# Patient Record
Sex: Male | Born: 1955
Health system: Southern US, Community
[De-identification: ages and names within clinical notes are randomized; demographics above are authoritative.]

## PROBLEM LIST (undated history)

## (undated) DIAGNOSIS — F32A Depression, unspecified: Secondary | ICD-10-CM

## (undated) DIAGNOSIS — E119 Type 2 diabetes mellitus without complications: Secondary | ICD-10-CM

## (undated) DIAGNOSIS — I639 Cerebral infarction, unspecified: Secondary | ICD-10-CM

## (undated) DIAGNOSIS — F329 Major depressive disorder, single episode, unspecified: Secondary | ICD-10-CM

## (undated) DIAGNOSIS — I10 Essential (primary) hypertension: Secondary | ICD-10-CM

---

## 2002-11-30 ENCOUNTER — Inpatient Hospital Stay (HOSPITAL_COMMUNITY): Admission: AD | Admit: 2002-11-30 | Discharge: 2002-12-03 | Payer: Self-pay | Admitting: Psychiatry

## 2004-01-11 ENCOUNTER — Inpatient Hospital Stay (HOSPITAL_COMMUNITY): Admission: RE | Admit: 2004-01-11 | Discharge: 2004-01-16 | Payer: Self-pay | Admitting: Psychiatry

## 2004-01-11 ENCOUNTER — Ambulatory Visit: Payer: Self-pay | Admitting: Psychiatry

## 2004-03-04 ENCOUNTER — Ambulatory Visit: Payer: Self-pay | Admitting: Psychiatry

## 2004-03-04 ENCOUNTER — Inpatient Hospital Stay (HOSPITAL_COMMUNITY): Admission: AD | Admit: 2004-03-04 | Discharge: 2004-03-09 | Payer: Self-pay | Admitting: Psychiatry

## 2004-05-15 ENCOUNTER — Inpatient Hospital Stay (HOSPITAL_COMMUNITY): Admission: AD | Admit: 2004-05-15 | Discharge: 2004-05-20 | Payer: Self-pay | Admitting: Psychiatry

## 2004-05-15 ENCOUNTER — Ambulatory Visit: Payer: Self-pay | Admitting: Psychiatry

## 2004-12-01 ENCOUNTER — Emergency Department (HOSPITAL_COMMUNITY): Admission: EM | Admit: 2004-12-01 | Discharge: 2004-12-01 | Payer: Self-pay | Admitting: Emergency Medicine

## 2005-10-30 ENCOUNTER — Inpatient Hospital Stay (HOSPITAL_COMMUNITY): Admission: AD | Admit: 2005-10-30 | Discharge: 2005-10-31 | Payer: Self-pay | Admitting: Psychiatry

## 2005-10-30 ENCOUNTER — Ambulatory Visit: Payer: Self-pay | Admitting: Psychiatry

## 2006-11-04 ENCOUNTER — Inpatient Hospital Stay (HOSPITAL_COMMUNITY): Admission: AD | Admit: 2006-11-04 | Discharge: 2006-11-10 | Payer: Self-pay | Admitting: Psychiatry

## 2006-11-04 ENCOUNTER — Ambulatory Visit: Payer: Self-pay | Admitting: *Deleted

## 2007-01-31 ENCOUNTER — Inpatient Hospital Stay (HOSPITAL_COMMUNITY): Admission: AD | Admit: 2007-01-31 | Discharge: 2007-02-01 | Payer: Self-pay | Admitting: Psychiatry

## 2007-01-31 ENCOUNTER — Ambulatory Visit: Payer: Self-pay | Admitting: Psychiatry
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
## 2010-07-09 NOTE — H&P (Signed)
[name], [name] NO.:  [id number]   MEDICAL RECORD NO.:  [id number]          PATIENT TYPE:  IPS   LOCATION:  0501                          FACILITY:  [facility]   PHYSICIAN:  Anselm Jungling, MD  DATE OF BIRTH:  10/28/1955   DATE OF ADMISSION:  01/31/2007  DATE OF DISCHARGE:                       PSYCHIATRIC ADMISSION ASSESSMENT   A 55 year old male voluntarily admitted on January 31, 2007.   HISTORY OF PRESENT ILLNESS:  The patient presents as a transfer from  [facility] where the patient was reporting auditory  hallucinations and insomnia.  States per chart that the voices were off  the wall was delusional, thinking that he was hanging out with Karie Chimera, who is a race car driver.  He does report today that he states  that the change of the daylight hours has prompted his depression.  He  regrets coming, does not feel suicidal.  Denies any psychotic symptoms  at this time.   PAST PSYCHIATRIC HISTORY:  The patient has been here prior.  No current  outpatient mental health treatment.   SOCIAL HISTORY:  He is a 55 year old male who lives alone, is on  disability for medical problems.   FAMILY HISTORY:  Is none that we are  aware of.   ALCOHOL DRUG HISTORY:  The patient smokes, denies any use of alcohol or  drug use.   MEDICAL HISTORY:  Primary care Aquila Delaughter.  The patient cannot remember  his primary care Jamina Macbeth.  Medical problems.  Has a history of back  pain, MI, hypertension and COPD.   MEDICATIONS:  Xanax 1 mg p.o. t.i.d., oxycodone ER 40 mg p.o. q.12 h,  oxycodone IR 5 mg q.6 h p.r.n., Celexa 40 mg daily, Simvastatin 40 mg at  bedtime and metoprolol ER 200 mg daily.   PHYSICAL FINDINGS:  This is a middle-aged male.  He is resting, appears  in no acute distress.  He was fully assessed at [facility].  No  acute findings.  He did receive Ativan 2 mg p.o. and Haldol 5 mg p.o.  for his psychotic symptoms.  Temperature is 96, 105 heart  rate, 16  respirations, blood pressure 122/91, 98% saturated, his pulse ox.  He is  183 pounds at 5 feet 9 inches tall.   LABORATORY DATA:  Has an abnormal EKG.  Potassium of 3.3, BUN is six.  CBC within normal limits.  His urine drug screen showed positive for  benzodiazepines, positive for opiates, positive for marijuana, positive  for methadone.   MENTAL STATUS EXAM:  Again he is resting in the bed at this time,  willingly participates  in the interview, fully alert, cooperative,  casually dressed, fair eye contact.  His speech is polite, clear.  Mood  is depressed.  The patient's affect is sad.  Thought process denies any  suicidal ideation or psychotic symptoms.  Thought process are coherent  and logical, cognitive function intact.  His memory is fair, again with  inability to remember his primary care Mitali Shenefield.  Judgment and insight  is adequate.   AXIS I:  Major depressive  disorder recurrent, seasonal affective  disorder.  Cannabis abuse.  AXIS II:  Deferred.  AXIS III:  COPD, CVA, back pain and hypertension.  AXIS IV:  Medical problems, other psychosocial problems related to  burden of illness.  AXIS V:  Current is 45.   PLAN:  To contract for safety.  Stabilize his mood thinking.  We will  continue with all his medications.  The patient is denying any suicidal  thoughts.  Case manager will assess his follow-up at [facility].  The  patient will follow up with his primary care Mildreth Reek as advised for  medical problems and further refills of his medications.   Tentative length of stay is 2 days.      Landry Corporal, N.P.      Anselm Jungling, MD  Electronically Signed    JO/MEDQ  D:  02/01/2007  T:  02/01/2007  Job:  [id number]
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
## 2010-07-12 NOTE — Discharge Summary (Signed)
[name], [name] NO.:  [id number]   MEDICAL RECORD NO.:  [id number]          PATIENT TYPE:  IPS   LOCATION:  0307                          FACILITY:  [facility]   PHYSICIAN:  [name], M.D. DATE OF BIRTH:  17-Jul-1955   DATE OF ADMISSION:  05/15/2004  DATE OF DISCHARGE:  05/20/2004                                 DISCHARGE SUMMARY   This is a 55 year old divorced white male that was involuntarily committed  on May 15, 2004 with suicidal thoughts but no plan, with stressors  including financial issues, lack of support.  The patient has been abusing  pain medications for approximately six weeks and also was using  benzodiazepines, smoking marijuana.  The patient reports panic attacks but  denied any psychotic symptoms.  The patient has been here previously at  least three times.   MEDICAL PROBLEMS.:  1.  Multiple musculoskeletal problems with past surgeries to his knee and      left wrist.  2.  Chronic pain.   CURRENT MEDICATIONS:  1.  Celexa 40 mg daily.  2.  Neurontin 300 mg daily at 2 p.m., 600 mg at 9 p.m.  3.  Seroquel 25 mg taking three tablets at 9 p.m.  4.  Valium 5 mg two tablets at 9 a.m., 1 p.m., 6 p.m. and 9 p.m.  5.  Ambien 10 mg take 1-2 at bedtime.  6.  Plavix 75 mg daily.  7.  Toprol XL 25 mg daily.  8.  Roxicodone 30 mg daily at 6 a.m., 1 p.m. and 7 p.m. for breakthrough      pain.  9.  OxyContin SR 40 mg twice daily.  10. Crestor 10 mg daily.   PHYSICAL EXAMINATION:  Performed and this is a well-developed, well-  nourished male with a pronounced due to right leg shortening after surgery  with limited range of motion and weakness to his left shoulder.  Neurological findings were intact.   LABORATORY DATA:  CBC was within normal limits.  BUN was low though at 5.  Liver functions were normal with an albumin mildly low at 3.3.  TSH was  normal.  Urine drug screen was positive for benzodiazepines and urinalysis  was negative.   MENTAL STATUS EXAM:  This is an alert and oriented male, oriented x4.  Appearance was disheveled, behavior was appropriate.  Speech is clear with  even tone and volume although somewhat tangential.  The patient appears  anxious and mildly depressed.  Thought processes, he is having some suicidal  thoughts without psychotic symptoms or mania observed.  Cognitive function,  concentration was normal.  Memory intact.  Judgment was poor.  Impulse fair.  Insight was poor.   DIAGNOSES:  Axis I.  Major depressive disorder, recurrent, severe with  suicidal ideation and polysubstance abuse.  Axis II.  Deferred.  Axis III.  Coronary artery disease status post coronary artery bypass  grafting, history of transient ischemic attack, multiple musculoskeletal  problems.  Axis IV.  Moderate with primary support group, lack of support, occupational  problems, economical problems and medical problems.  HOSPITAL COURSE:  The patient was initially detoxified off of opiates with a  clonidine protocol with withdrawal symptoms noted.  The patient was also  detoxified on low dose Librium for increased use and abuse of  benzodiazepines.  The patient was offered muscle relaxant for pain.  Resumed  his routine medications.  We did resume the patient's Duragesic patch with  the patient to adhere to routine follow up for his pain with either his  primary care doctor or [facility] Clinic.  His medications were  optimized for agitation and mood instability.  The patient was medication  seeking and threatening to search if we did not address his complaints of  pain.  The patient did, however, improve with optimization of his  medications, again addressing his pain issues and need for consistent follow  up.  On the day of discharge the patient was in full contact with reality  and denied any suicidal thoughts, tolerating his medication.  He was  agreeable to discharge plan.  He was attending groups and behavior  with  others and interaction was appropriate.   POSTHOSPITAL CARE PLAN:  1.  The patient was discharged on his metoprolol 25 mg 1 tablet daily,      clopidogrel 75 mg 1 daily with food, Zocor 40 mg 1 tablet at 6 p.m.      daily, Septra, Bactrim double-strength tablets 1 tablet at 6 and 1      tablet at 7 on an empty stomach for 12 days, tetracycline 250 mg 1      tablet at 6 and 1 tablet at 7 on an empty stomach for 12 days, Seroquel      100 mg taking 1-1/2 tablets by mouth at 8 p.m., Neurontin 1 tablet by      mouth daily at 8 p.m., fentanyl patch 75 mcg to be changed every three      days, gabapentin 400 mg 1 tablet at 9 a.m. and 4 p.m., Valium 10 mg 1      four times a day.  2.  The patient was to follow up with the a pain clinic of his choice.  He      was also to attend [facility] with appointment made.  Phone number and      address provided.  3.  The patient was advised not to use alcohol or any other drugs.   DISCHARGE DIAGNOSES:  Axis I.  Major depressive disorder, recurrent severe  with suicidal thoughts.  Polysubstance abuse.  Axis II.  Deferred.  Axis III.  Coronary artery disease, status post coronary artery bypass  grafting.  History of transient ischemic attack.  Multiple musculoskeletal  problems.  Axis IV.  Moderate psychosocial stressors.  Axis V.  Current is 55, estimated this past year is 88.      JO/MEDQ  D:  07/04/2004  T:  07/04/2004  Job:  [id number]
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
## 2010-12-06 LAB — TSH: TSH: 3.089

## 2010-12-06 LAB — URINALYSIS, ROUTINE W REFLEX MICROSCOPIC
Bilirubin Urine: NEGATIVE
Glucose, UA: NEGATIVE
Hgb urine dipstick: NEGATIVE
Ketones, ur: NEGATIVE
Nitrite: NEGATIVE
Protein, ur: NEGATIVE
Specific Gravity, Urine: 1.011
Urobilinogen, UA: 1
pH: 6.5

## 2013-10-15 ENCOUNTER — Inpatient Hospital Stay (HOSPITAL_COMMUNITY): Payer: Medicare Other

## 2013-10-15 ENCOUNTER — Inpatient Hospital Stay (HOSPITAL_COMMUNITY)
Admission: EM | Admit: 2013-10-15 | Discharge: 2013-10-17 | DRG: 065 | Disposition: A | Discharge status: Home Health | Payer: Medicare Other | Attending: Internal Medicine | Admitting: Internal Medicine

## 2013-10-15 ENCOUNTER — Encounter (HOSPITAL_COMMUNITY): Payer: Self-pay | Admitting: Emergency Medicine

## 2013-10-15 ENCOUNTER — Emergency Department (HOSPITAL_COMMUNITY): Payer: Medicare Other

## 2013-10-15 DIAGNOSIS — Z8673 Personal history of transient ischemic attack (TIA), and cerebral infarction without residual deficits: Secondary | ICD-10-CM | POA: Diagnosis not present

## 2013-10-15 DIAGNOSIS — I658 Occlusion and stenosis of other precerebral arteries: Secondary | ICD-10-CM | POA: Diagnosis present

## 2013-10-15 DIAGNOSIS — R29898 Other symptoms and signs involving the musculoskeletal system: Secondary | ICD-10-CM | POA: Diagnosis present

## 2013-10-15 DIAGNOSIS — E663 Overweight: Secondary | ICD-10-CM | POA: Diagnosis present

## 2013-10-15 DIAGNOSIS — E785 Hyperlipidemia, unspecified: Secondary | ICD-10-CM | POA: Diagnosis present

## 2013-10-15 DIAGNOSIS — I2510 Atherosclerotic heart disease of native coronary artery without angina pectoris: Secondary | ICD-10-CM | POA: Diagnosis present

## 2013-10-15 DIAGNOSIS — I6529 Occlusion and stenosis of unspecified carotid artery: Secondary | ICD-10-CM | POA: Diagnosis present

## 2013-10-15 DIAGNOSIS — R4789 Other speech disturbances: Secondary | ICD-10-CM | POA: Diagnosis present

## 2013-10-15 DIAGNOSIS — Z951 Presence of aortocoronary bypass graft: Secondary | ICD-10-CM | POA: Diagnosis not present

## 2013-10-15 DIAGNOSIS — E119 Type 2 diabetes mellitus without complications: Secondary | ICD-10-CM | POA: Diagnosis present

## 2013-10-15 DIAGNOSIS — I10 Essential (primary) hypertension: Secondary | ICD-10-CM | POA: Diagnosis present

## 2013-10-15 DIAGNOSIS — I6350 Cerebral infarction due to unspecified occlusion or stenosis of unspecified cerebral artery: Secondary | ICD-10-CM | POA: Diagnosis present

## 2013-10-15 DIAGNOSIS — I6330 Cerebral infarction due to thrombosis of unspecified cerebral artery: Secondary | ICD-10-CM

## 2013-10-15 DIAGNOSIS — I252 Old myocardial infarction: Secondary | ICD-10-CM | POA: Diagnosis not present

## 2013-10-15 DIAGNOSIS — E871 Hypo-osmolality and hyponatremia: Secondary | ICD-10-CM | POA: Diagnosis present

## 2013-10-15 DIAGNOSIS — I639 Cerebral infarction, unspecified: Secondary | ICD-10-CM

## 2013-10-15 DIAGNOSIS — F17200 Nicotine dependence, unspecified, uncomplicated: Secondary | ICD-10-CM | POA: Diagnosis present

## 2013-10-15 DIAGNOSIS — Z6828 Body mass index (BMI) 28.0-28.9, adult: Secondary | ICD-10-CM | POA: Diagnosis not present

## 2013-10-15 DIAGNOSIS — F1210 Cannabis abuse, uncomplicated: Secondary | ICD-10-CM | POA: Diagnosis present

## 2013-10-15 DIAGNOSIS — N289 Disorder of kidney and ureter, unspecified: Secondary | ICD-10-CM | POA: Diagnosis present

## 2013-10-15 HISTORY — DX: Essential (primary) hypertension: I10

## 2013-10-15 HISTORY — DX: Type 2 diabetes mellitus without complications: E11.9

## 2013-10-15 HISTORY — DX: Cerebral infarction, unspecified: I63.9

## 2013-10-15 LAB — PROTIME-INR
INR: 0.94 (ref 0.00–1.49)
Prothrombin Time: 12.6 seconds (ref 11.6–15.2)

## 2013-10-15 LAB — COMPREHENSIVE METABOLIC PANEL
ALT: 7 U/L (ref 0–53)
AST: 10 U/L (ref 0–37)
Albumin: 3.5 g/dL (ref 3.5–5.2)
Alkaline Phosphatase: 62 U/L (ref 39–117)
Anion gap: 13 (ref 5–15)
BUN: 14 mg/dL (ref 6–23)
CALCIUM: 9.2 mg/dL (ref 8.4–10.5)
CO2: 26 mEq/L (ref 19–32)
CREATININE: 1.22 mg/dL (ref 0.50–1.35)
Chloride: 95 mEq/L — ABNORMAL LOW (ref 96–112)
GFR calc Af Amer: 75 mL/min — ABNORMAL LOW (ref >90)
GFR calc non Af Amer: 65 mL/min — ABNORMAL LOW (ref >90)
GLUCOSE: 104 mg/dL — AB (ref 70–99)
Potassium: 4.5 mEq/L (ref 3.7–5.3)
SODIUM: 134 meq/L — AB (ref 137–147)
Total Bilirubin: 0.2 mg/dL — ABNORMAL LOW (ref 0.3–1.2)
Total Protein: 6.7 g/dL (ref 6.0–8.3)

## 2013-10-15 LAB — DIFFERENTIAL
Basophils Absolute: 0.1 10*3/uL (ref 0.0–0.1)
Basophils Relative: 1 % (ref 0–1)
Eosinophils Absolute: 0.1 10*3/uL (ref 0.0–0.7)
Eosinophils Relative: 2 % (ref 0–5)
LYMPHS PCT: 38 % (ref 12–46)
Lymphs Abs: 1.9 10*3/uL (ref 0.7–4.0)
MONO ABS: 0.5 10*3/uL (ref 0.1–1.0)
MONOS PCT: 9 % (ref 3–12)
Neutro Abs: 2.6 10*3/uL (ref 1.7–7.7)
Neutrophils Relative %: 50 % (ref 43–77)

## 2013-10-15 LAB — CBC
HEMATOCRIT: 40.7 % (ref 39.0–52.0)
HEMOGLOBIN: 14.2 g/dL (ref 13.0–17.0)
MCH: 30.6 pg (ref 26.0–34.0)
MCHC: 34.9 g/dL (ref 30.0–36.0)
MCV: 87.7 fL (ref 78.0–100.0)
Platelets: 196 10*3/uL (ref 150–400)
RBC: 4.64 MIL/uL (ref 4.22–5.81)
RDW: 14.4 % (ref 11.5–15.5)
WBC: 5.1 10*3/uL (ref 4.0–10.5)

## 2013-10-15 LAB — I-STAT CHEM 8, ED
BUN: 14 mg/dL (ref 6–23)
Calcium, Ion: 1.11 mmol/L — ABNORMAL LOW (ref 1.12–1.23)
Chloride: 98 mEq/L (ref 96–112)
Creatinine, Ser: 1.4 mg/dL — ABNORMAL HIGH (ref 0.50–1.35)
GLUCOSE: 109 mg/dL — AB (ref 70–99)
HCT: 43 % (ref 39.0–52.0)
HEMOGLOBIN: 14.6 g/dL (ref 13.0–17.0)
Potassium: 3.9 mEq/L (ref 3.7–5.3)
Sodium: 133 mEq/L — ABNORMAL LOW (ref 137–147)
TCO2: 26 mmol/L (ref 0–100)

## 2013-10-15 LAB — GLUCOSE, CAPILLARY
GLUCOSE-CAPILLARY: 95 mg/dL (ref 70–99)
Glucose-Capillary: 89 mg/dL (ref 70–99)

## 2013-10-15 LAB — I-STAT TROPONIN, ED: Troponin i, poc: 0 ng/mL (ref 0.00–0.08)

## 2013-10-15 LAB — ETHANOL: Alcohol, Ethyl (B): <11 mg/dL (ref 0–11)

## 2013-10-15 LAB — APTT: aPTT: 29 seconds (ref 24–37)

## 2013-10-15 MED ORDER — STUDY - INVESTIGATIONAL DRUG SIMPLE RECORD
90.0000 mg | Freq: Two times a day (BID) | Status: DC
  Filled 2013-10-15: qty 90

## 2013-10-15 MED ORDER — SIMVASTATIN 40 MG PO TABS
40.0000 mg | ORAL_TABLET | Freq: Every day | ORAL | Status: DC
  Administered 2013-10-15 – 2013-10-16 (×2): 40 mg via ORAL
  Filled 2013-10-15 (×2): qty 1

## 2013-10-15 MED ORDER — STUDY - INVESTIGATIONAL DRUG SIMPLE RECORD
300.0000 mg | Freq: Once | Status: AC
  Administered 2013-10-15: 300 mg via ORAL
  Filled 2013-10-15: qty 300

## 2013-10-15 MED ORDER — SODIUM CHLORIDE 0.9 % IV SOLN
INTRAVENOUS | Status: AC
  Administered 2013-10-15: 1000 mL via INTRAVENOUS

## 2013-10-15 MED ORDER — STUDY - INVESTIGATIONAL DRUG SIMPLE RECORD
180.0000 mg | Freq: Once | Status: AC
  Administered 2013-10-15: 180 mg via ORAL
  Filled 2013-10-15: qty 180

## 2013-10-15 MED ORDER — INSULIN ASPART 100 UNIT/ML ~~LOC~~ SOLN: 0.0000 [IU] | Freq: Three times a day (TID) | SUBCUTANEOUS | Status: DC

## 2013-10-15 MED ORDER — STUDY - INVESTIGATIONAL DRUG SIMPLE RECORD
100.0000 mg | Freq: Every day | Status: DC
  Administered 2013-10-16 – 2013-10-17 (×2): 100 mg via ORAL
  Filled 2013-10-15 (×2): qty 100

## 2013-10-15 MED ORDER — ENOXAPARIN SODIUM 40 MG/0.4ML ~~LOC~~ SOLN
40.0000 mg | SUBCUTANEOUS | Status: DC
  Administered 2013-10-15: 40 mg via SUBCUTANEOUS
  Filled 2013-10-15: qty 0.4

## 2013-10-15 MED ORDER — HYDRALAZINE HCL 20 MG/ML IJ SOLN: 10.0000 mg | Freq: Four times a day (QID) | INTRAMUSCULAR | Status: DC | PRN

## 2013-10-15 MED ORDER — ASPIRIN 325 MG PO TABS
325.0000 mg | ORAL_TABLET | Freq: Every day | ORAL | Status: DC
  Administered 2013-10-15: 325 mg via ORAL
  Filled 2013-10-15: qty 1

## 2013-10-15 MED ORDER — TRAZODONE HCL 100 MG PO TABS: 200.0000 mg | ORAL_TABLET | Freq: Every evening | ORAL | Status: DC | PRN

## 2013-10-15 MED ORDER — ALPRAZOLAM 0.5 MG PO TABS
1.0000 mg | ORAL_TABLET | Freq: Three times a day (TID) | ORAL | Status: DC
  Administered 2013-10-15 – 2013-10-17 (×6): 1 mg via ORAL
  Filled 2013-10-15 (×6): qty 2

## 2013-10-15 MED ORDER — ONDANSETRON HCL 4 MG/2ML IJ SOLN: 4.0000 mg | Freq: Three times a day (TID) | INTRAMUSCULAR | Status: AC | PRN

## 2013-10-15 MED ORDER — GABAPENTIN 400 MG PO CAPS
800.0000 mg | ORAL_CAPSULE | Freq: Three times a day (TID) | ORAL | Status: DC
  Administered 2013-10-15 – 2013-10-17 (×6): 800 mg via ORAL
  Filled 2013-10-15 (×4): qty 2
  Filled 2013-10-15: qty 8
  Filled 2013-10-15: qty 2

## 2013-10-15 MED ORDER — SENNOSIDES-DOCUSATE SODIUM 8.6-50 MG PO TABS: 1.0000 | ORAL_TABLET | Freq: Every evening | ORAL | Status: DC | PRN

## 2013-10-15 MED ORDER — STROKE: EARLY STAGES OF RECOVERY BOOK
Freq: Once | Status: AC
  Administered 2013-10-15: 17:00:00
  Filled 2013-10-15: qty 1

## 2013-10-15 MED ORDER — DULOXETINE HCL 60 MG PO CPEP
60.0000 mg | ORAL_CAPSULE | Freq: Two times a day (BID) | ORAL | Status: DC
  Administered 2013-10-15 – 2013-10-17 (×4): 60 mg via ORAL
  Filled 2013-10-15 (×4): qty 1

## 2013-10-15 MED ORDER — OXYCODONE HCL 5 MG PO TABS
5.0000 mg | ORAL_TABLET | Freq: Four times a day (QID) | ORAL | Status: DC | PRN
  Administered 2013-10-16 – 2013-10-17 (×4): 5 mg via ORAL
  Filled 2013-10-15 (×4): qty 1

## 2013-10-15 MED ORDER — STUDY - INVESTIGATIONAL DRUG SIMPLE RECORD
90.0000 mg | Freq: Two times a day (BID) | Status: DC
  Administered 2013-10-16 – 2013-10-17 (×3): 90 mg via ORAL
  Filled 2013-10-15 (×2): qty 90

## 2013-10-15 NOTE — ED Notes (Signed)
Pt returned to ed room 34 from CT. This RN, EMT, Angelique Blonderenise with [facility], Dr. Radford PaxBeaton at the bedside.

## 2013-10-15 NOTE — H&P (Signed)
[facility] History and Physical  Ian JubaHugh Donald Geraghty ZOX:[id number]:[id number] DOB: 09/02/1955 DOA: 10/15/2013  Referring physician: PCP: No PCP Per Patient  Specialists:   Chief Complaint: slurred speech, L sided weakenss   HPI: [name] is a 58 y.o. male with PMH of CAD s/p CABG, HTN, DM, h/o CVA presented with L sided weakness, and slurred speech, Deficits were present on waking up this morning; He reports intermittent similar symptoms with muscle weakness for 3-4 week without speech change; denies chest pain, no SOB, no nausea, vomiting or diarrhea -Pt was seen by neurology in ED   Review of Systems: The patient denies anorexia, fever, weight loss,, vision loss, decreased hearing, hoarseness, chest pain, syncope, dyspnea on exertion, peripheral edema, balance deficits, hemoptysis, abdominal pain, melena, hematochezia, severe indigestion/heartburn, hematuria, incontinence, genital sores, muscle weakness, suspicious skin lesions, transient blindness, difficulty walking, depression, unusual weight change, abnormal bleeding, enlarged lymph nodes, angioedema, and breast masses.    Past Medical History  Diagnosis Date  . Stroke   . Diabetes mellitus without complication   . Hypertension    History reviewed. No pertinent past surgical history. Social History:  reports that he has been smoking Cigarettes.  He has been smoking about 1.00 pack per day. He does not have any smokeless tobacco history on file. He reports that he uses illicit drugs (Marijuana). He reports that he does not drink alcohol. Home;  where does patient live--home, ALF, SNF? and with whom if at home? Yes;  Can patient participate in ADLs?  No Known Allergies  History reviewed. No pertinent family history.  (be sure to complete)  Prior to Admission medications   Medication Sig Start Date End Date Taking? Authorizing Provider  ALPRAZolam Prudy Feeler(XANAX) 1 MG tablet Take 1 mg by mouth 3 (three) times daily.   Yes Historical  Provider, MD  DULoxetine (CYMBALTA) 60 MG capsule Take 60 mg by mouth 2 (two) times daily.   Yes Historical Provider, MD  gabapentin (NEURONTIN) 400 MG capsule Take 800 mg by mouth 3 (three) times daily.   Yes Historical Provider, MD  metFORMIN (GLUCOPHAGE) 500 MG tablet Take 500 mg by mouth 2 (two) times daily.   Yes Historical Provider, MD  traZODone (DESYREL) 100 MG tablet Take 200 mg by mouth at bedtime as needed for sleep.   Yes Historical Provider, MD   Physical Exam: Filed Vitals:   10/15/13 1503  BP:   Pulse:   Temp: 98.6 F (37 C)  Resp:      General:  alert  Eyes: eom-i  ENT: no oral ulcers   Neck: supple   Cardiovascular: s1,s2 rrr  Respiratory: CTA BL  Abdomen: soft, nt,nd   Skin: no rash, multiple scar  Musculoskeletal: no LE edema  Psychiatric: no hallucinations   Neurologic: dysarthria, CN 2-12 intact; mild weak in L side  Labs on Admission:  Basic Metabolic Panel:  Recent Labs Lab 10/15/13 1349 10/15/13 1355  NA 134* 133*  K 4.5 3.9  CL 95* 98  CO2 26  --   GLUCOSE 104* 109*  BUN 14 14  CREATININE 1.22 1.40*  CALCIUM 9.2  --    Liver Function Tests:  Recent Labs Lab 10/15/13 1349  AST 10  ALT 7  ALKPHOS 62  BILITOT 0.2*  PROT 6.7  ALBUMIN 3.5   No results found for this basename: LIPASE, AMYLASE,  in the last 168 hours No results found for this basename: AMMONIA,  in the last 168 hours CBC:  Recent Labs  Lab 10/15/13 1349 10/15/13 1355  WBC 5.1  --   NEUTROABS 2.6  --   HGB 14.2 14.6  HCT 40.7 43.0  MCV 87.7  --   PLT 196  --    Cardiac Enzymes: No results found for this basename: CKTOTAL, CKMB, CKMBINDEX, TROPONINI,  in the last 168 hours  BNP (last 3 results) No results found for this basename: PROBNP,  in the last 8760 hours CBG: No results found for this basename: GLUCAP,  in the last 168 hours  Radiological Exams on Admission: Ct Head Wo Contrast  10/15/2013   CLINICAL DATA:  Slurred speech and left-sided  weakness  EXAM: CT HEAD WITHOUT CONTRAST  TECHNIQUE: Contiguous axial images were obtained from the base of the skull through the vertex without intravenous contrast.  COMPARISON:  None.  FINDINGS: The ventricles are normal in size and configuration. There is no mass, hemorrhage, extra-axial fluid collection, or midline shift.  There is decreased attenuation in the region of the mid left extreme capsule and insular cortex, concerning for acute infarct in this area. Elsewhere gray-white compartments appear normal. Middle cerebral arteries appear symmetric with respect to attenuation bilaterally. Bony calvarium appears intact. The mastoid air cells are clear. There is mild mucosal thickening in the inferior right maxillary antrum.  IMPRESSION: Decreased attenuation in the region of the left extreme capsule and insular cortex concerning for acute infarct in this area. No hemorrhage or mass effect.   Electronically Signed   By: Bretta Bang M.D.   On: 10/15/2013 14:06    EKG: Independently reviewed.   Assessment/Plan Principal Problem:   CVA (cerebral infarction) Active Problems:   Hypertension   Diabetes mellitus  58 y.o. male with PMH of CAD s/p CABG, HTN, DM, h/o CVA presented with L sided weakness, and slurred speech  1. Suspected acute CVA; L sided weakness with dysarthria  -CT head: Decreased attenuation in the region of the left extreme capsule and insular cortex concerning for acute infarct in this area -started ASA, CVA work up ordered; neurology following; out of TPA window    2. HTN not on meds; permissive HTN; monitor,prn hydralazine  3. DM, hold metformin; ISS: check ha1c 4. CAD s/p CABG, no acute chest pain; start ASA, added statin; likely needs BB when BP allows   Unclear meds history; consulted pharmacy evaluation   Neurology;  if consultant consulted, please document name and whether formally or informally consulted  Code Status: full (must indicate code status--if unknown  or must be presumed, indicate so) Family Communication:  D/w patient (indicate person spoken with, if applicable, with phone number if by telephone) Disposition Plan: pend PT (indicate anticipated LOS)  Time spent: >35 minutes   Esperanza Sheets [facility] Pager [phone]  If 7PM-7AM, please contact night-coverage [email or web address] Password [facility] 10/15/2013, 3:13 PM

## 2013-10-15 NOTE — ED Notes (Signed)
[facility], pt was trying to get his neighbor to take her to the hospital and he couldn't get up so they called 911. Pt went to bed last night at about 2130 or 2200 normal. Woke up this morning feeling funny and weak on the left side at 0600. EMS reports pt symptoms started at 1300. Pt is A/Ox4 and is normally fully functional. 22g to RW.

## 2013-10-15 NOTE — ED Provider Notes (Signed)
CSN: [id number]     Arrival date & time 10/15/13  1348 History   First MD Initiated Contact with Patient 10/15/13 1423     Chief Complaint  Patient presents with  . Code Stroke    @EDPCLEARED @  HPI [facility], pt was trying to get his neighbor to take her to the hospital and he couldn't get up so they called 911. Pt went to bed last night at about 2130 or 2200 normal. Woke up this morning feeling funny and weak on the left side at 0600. EMS reports pt symptoms started at 1300. Pt is A/Ox4 and is normally fully functional. Seen by neurology in CT scan after code stroke called  Past Medical History  Diagnosis Date  . Stroke   . Diabetes mellitus without complication   . Hypertension    History reviewed. No pertinent past surgical history. History reviewed. No pertinent family history. History  Substance Use Topics  . Smoking status: Current Every Day Smoker -- 1.00 packs/day    Types: Cigarettes  . Smokeless tobacco: Not on file  . Alcohol Use: No    Review of Systems  Level V caveat  Allergies  Review of patient's allergies indicates no known allergies.  Home Medications   Prior to Admission medications   Medication Sig Start Date End Date Taking? Authorizing Provider  ALPRAZolam Prudy Feeler(XANAX) 1 MG tablet Take 1 mg by mouth 3 (three) times daily.   Yes Historical Provider, MD  DULoxetine (CYMBALTA) 60 MG capsule Take 60 mg by mouth 2 (two) times daily.   Yes Historical Provider, MD  gabapentin (NEURONTIN) 400 MG capsule Take 800 mg by mouth 3 (three) times daily.   Yes Historical Provider, MD  metFORMIN (GLUCOPHAGE) 500 MG tablet Take 500 mg by mouth 2 (two) times daily.   Yes Historical Provider, MD  traZODone (DESYREL) 100 MG tablet Take 200 mg by mouth at bedtime as needed for sleep.   Yes Historical Provider, MD   BP 133/97  Pulse 80  Temp(Src) 98.2 F (36.8 C) (Oral)  Resp 16  Ht 5\' 11"  (1.803 m)  Wt 201 lb 1 oz (91.2 kg)  BMI 28.05 kg/m2  SpO2 96% Physical  Exam  Nursing note and vitals reviewed. Constitutional: He is oriented to person, place, and time. He appears well-developed and well-nourished. No distress.  HENT:  Head: Normocephalic and atraumatic.  Eyes: Pupils are equal, round, and reactive to light.  Neck: Normal range of motion.  Cardiovascular: Normal rate and intact distal pulses.   Pulmonary/Chest: No respiratory distress.  Abdominal: Normal appearance. He exhibits no distension.  Neurological: He is alert and oriented to person, place, and time. A cranial nerve deficit is present. He exhibits abnormal muscle tone. Coordination abnormal. GCS eye subscore is 4. GCS verbal subscore is 5. GCS motor subscore is 6.  Speech slurred  Skin: Skin is warm and dry. No rash noted.  Psychiatric: He has a normal mood and affect. His behavior is normal.    ED Course  Procedures (including critical care time)  CRITICAL CARE Performed by: Nelva NayBEATON,Tiphanie Vo L Total critical care time: 30 min Critical care time was exclusive of separately billable procedures and treating other patients. Critical care was necessary to treat or prevent imminent or life-threatening deterioration. Critical care was time spent personally by me on the following activities: development of treatment plan with patient and/or surrogate as well as nursing, discussions with consultants, evaluation of patient's response to treatment, examination of patient, obtaining history from  patient or surrogate, ordering and performing treatments and interventions, ordering and review of laboratory studies, ordering and review of radiographic studies, pulse oximetry and re-evaluation of patient's condition.   Seen by neurology.  Recommended medicine admission and they will follow and consult.  Medicine contacted and accepted patient.   Labs Review Labs Reviewed  COMPREHENSIVE METABOLIC PANEL - Abnormal; Notable for the following:    Sodium 134 (*)    Chloride 95 (*)    Glucose, Bld 104  (*)    Total Bilirubin 0.2 (*)    GFR calc non Af Amer 65 (*)    GFR calc Af Amer 75 (*)    All other components within normal limits  LIPID PANEL - Abnormal; Notable for the following:    Cholesterol 224 (*)    Triglycerides 205 (*)    HDL 32 (*)    VLDL 41 (*)    LDL Cholesterol 151 (*)    All other components within normal limits  GLUCOSE, CAPILLARY - Abnormal; Notable for the following:    Glucose-Capillary 103 (*)    All other components within normal limits  URINE RAPID DRUG SCREEN (HOSP PERFORMED) - Abnormal; Notable for the following:    Benzodiazepines POSITIVE (*)    Tetrahydrocannabinol POSITIVE (*)    All other components within normal limits  I-STAT CHEM 8, ED - Abnormal; Notable for the following:    Sodium 133 (*)    Creatinine, Ser 1.40 (*)    Glucose, Bld 109 (*)    Calcium, Ion 1.11 (*)    All other components within normal limits  ETHANOL  PROTIME-INR  APTT  CBC  DIFFERENTIAL  GLUCOSE, CAPILLARY  GLUCOSE, CAPILLARY  URINALYSIS, ROUTINE W REFLEX MICROSCOPIC  URINE RAPID DRUG SCREEN (HOSP PERFORMED)  URINALYSIS, ROUTINE W REFLEX MICROSCOPIC  HEMOGLOBIN A1C  I-STAT TROPOININ, ED  Rosezena Sensor, ED    Imaging Review Dg Chest 2 View  10/15/2013   CLINICAL DATA:  Stroke.  EXAM: CHEST  2 VIEW  COMPARISON:  None.  FINDINGS: Previous median sternotomy and CABG procedure. The heart size and mediastinal contours are within normal limits. Both lungs are clear. The visualized skeletal structures are unremarkable.  IMPRESSION: No active cardiopulmonary disease.   Electronically Signed   By: Signa Kell M.D.   On: 10/15/2013 21:48   Ct Head Wo Contrast  10/15/2013   CLINICAL DATA:  Slurred speech and left-sided weakness  EXAM: CT HEAD WITHOUT CONTRAST  TECHNIQUE: Contiguous axial images were obtained from the base of the skull through the vertex without intravenous contrast.  COMPARISON:  None.  FINDINGS: The ventricles are normal in size and configuration.  There is no mass, hemorrhage, extra-axial fluid collection, or midline shift.  There is decreased attenuation in the region of the mid left extreme capsule and insular cortex, concerning for acute infarct in this area. Elsewhere gray-white compartments appear normal. Middle cerebral arteries appear symmetric with respect to attenuation bilaterally. Bony calvarium appears intact. The mastoid air cells are clear. There is mild mucosal thickening in the inferior right maxillary antrum.  IMPRESSION: Decreased attenuation in the region of the left extreme capsule and insular cortex concerning for acute infarct in this area. No hemorrhage or mass effect.   Electronically Signed   By: Bretta Bang M.D.   On: 10/15/2013 14:06     EKG Interpretation   Date/Time:  Saturday October 15 2013 14:02:40 EDT Ventricular Rate:  77 PR Interval:  175 QRS Duration: 97 QT Interval:  357  QTC Calculation: 404 R Axis:   78 Text Interpretation:  Sinus rhythm Anterior infarct, old Nonspecific T  abnormalities, lateral leads Baseline wander in lead(s) V6 No previous  tracing Confirmed by Brighton Pilley  MD, Jehad Bisono ([id number]) on 10/15/2013 2:37:30 PM      MDM   Final diagnoses:  Cerebral infarction due to unspecified mechanism        Nelia Shi, MD 10/16/13 1151

## 2013-10-15 NOTE — Plan of Care (Signed)
Came to see pt regarding SOCRATES trial.  He has PMH of HTN, HLD, DM, current smoker, previous "mini-stroke" woke up this am with left arm and leg weakness as well as dysarthria. Last seen normal 10pm. He also complains that he had whole body weakness episode 4-5 times for the last 2 months, but no focal deficit, lasting 20-2930min. His CT negative for bleeding but questionable old left BG infarct. NIHSS = 5. Still a candidate for SOCRATES. Talked with pt, and he is interested and agreed to be enrolled in the trial. Will proceed.  Marvel PlanJindong Ryheem Jay, MD PhD Stroke Neurology 10/15/2013 5:51 PM

## 2013-10-15 NOTE — ED Notes (Signed)
Dr. Roseanne RenoStewart back at the bedside to speak with the patient.

## 2013-10-15 NOTE — Progress Notes (Signed)
MEDICATION RELATED CONSULT NOTE - INITIAL   Pharmacy Consult for Medication Review Indication: Interacting medications No Known Allergies Patient Measurements: Height: 5\' 11"  (180.3 cm) Weight: 201 lb 1 oz (91.2 kg) IBW/kg (Calculated) : 75.3 Vital Signs: Temp: 98.6 F (37 C) (08/22 1503) Temp src: Oral (08/22 1404) BP: 152/77 mmHg (08/22 1515) Pulse Rate: 72 (08/22 1515) Labs:  Recent Labs  10/15/13 1349 10/15/13 1355  WBC 5.1  --   HGB 14.2 14.6  HCT 40.7 43.0  PLT 196  --   APTT 29  --   CREATININE 1.22 1.40*  ALBUMIN 3.5  --   PROT 6.7  --   AST 10  --   ALT 7  --   ALKPHOS 62  --   BILITOT 0.2*  --    Estimated Creatinine Clearance: 68.9 ml/min (by C-G formula based on Cr of 1.4).  Medical History: Past Medical History  Diagnosis Date  . Stroke   . Diabetes mellitus without complication   . Hypertension     Medications:  Prescriptions prior to admission  Medication Sig Dispense Refill  . ALPRAZolam (XANAX) 1 MG tablet Take 1 mg by mouth 3 (three) times daily.      . DULoxetine (CYMBALTA) 60 MG capsule Take 60 mg by mouth 2 (two) times daily.      Marland Kitchen. gabapentin (NEURONTIN) 400 MG capsule Take 800 mg by mouth 3 (three) times daily.      . metFORMIN (GLUCOPHAGE) 500 MG tablet Take 500 mg by mouth 2 (two) times daily.      . traZODone (DESYREL) 100 MG tablet Take 200 mg by mouth at bedtime as needed for sleep.        Assessment: [age] YOM admitted with slurred speech and L-sided weakness. Consulted to review medications for drug interactions.   Home medications listed above as reported per patient based on medication history standard. This represents what patient reports taking in last 30 days.   Interactions observed: 1. No duplicate classes of medications. 2. Patient is on multiple CNS medications - all of different classes including alprazolam (BZD), duloxetine (SNRI), gabapentin, and trazodone.   Link SnufferJessica Suzanne Garbers, PharmD, BCPS Clinical  Pharmacist [phone] 10/15/2013,4:46 PM

## 2013-10-15 NOTE — Consult Note (Signed)
Referring Physician: Dr. Radford Pax    Chief Complaint: New-onset left-sided weakness  HPI: [name] is an 58 y.o. male with a history of previous stroke, diabetes mellitus, hyperlipidemia, hypertension and myocardial infarction, presenting with new onset weakness involving his left side. Deficits were present on waking up this morning. He was last known well at bedtime at 10 PM last night. He has not been on antiplatelet therapy. CT scan of his head showed an equivocal left cortical acute small infarct. No acute abnormality was seen on the right. NIH stroke score was 5.  LSN: 10 PM on 10/14/2013 tPA Given: No: Beyond time under for treatment consideration MRankin: 2  History reviewed. No pertinent past medical history.  History reviewed. No pertinent family history.   Medications: I have reviewed the patient's current medications.  ROS: History obtained from the patient  General ROS: negative for - chills, fatigue, fever, night sweats, weight gain or weight loss Psychological ROS: negative for - behavioral disorder, hallucinations, memory difficulties, mood swings or suicidal ideation Ophthalmic ROS: negative for - blurry vision, double vision, eye pain or loss of vision ENT ROS: negative for - epistaxis, nasal discharge, oral lesions, sore throat, tinnitus or vertigo Allergy and Immunology ROS: negative for - hives or itchy/watery eyes Hematological and Lymphatic ROS: negative for - bleeding problems, bruising or swollen lymph nodes Endocrine ROS: negative for - galactorrhea, hair pattern changes, polydipsia/polyuria or temperature intolerance Respiratory ROS: negative for - cough, hemoptysis, shortness of breath or wheezing Cardiovascular ROS: negative for - chest pain, dyspnea on exertion, edema or irregular heartbeat Gastrointestinal ROS: negative for - abdominal pain, diarrhea, hematemesis, nausea/vomiting or stool incontinence Genito-Urinary ROS: negative for - dysuria,  hematuria, incontinence or urinary frequency/urgency Musculoskeletal ROS: negative for - joint swelling or muscular weakness Neurological ROS: as noted in HPI Dermatological ROS: negative for rash and skin lesion changes  Physical Examination: Blood pressure 140/88, pulse 79, temperature 98.5 F (36.9 C), temperature source Oral, resp. rate 15, height 5\' 11"  (1.803 m), weight 91.2 kg (201 lb 1 oz), SpO2 98.00%.  Neurologic Examination: Mental Status: Alert, oriented, thought content appropriate.  Speech moderately slurred without evidence of aphasia. Able to follow commands without difficulty. Cranial Nerves: II-Visual fields were normal. III/IV/VI-Pupils were equal and reacted. Extraocular movements were full and conjugate.    V/VII-no facial numbness; mild left lower vision weakness. VIII-normal. X-moderate dysarthria. Motor: Mild drift of left upper and lower extremities; motor exam otherwise unremarkable. Sensory: Normal throughout. Deep Tendon Reflexes: 1+ and symmetric. Plantars: Mute bilaterally Cerebellar: Slightly impaired coordination of left upper extremity, commensurate with mild weakness..   Ct Head Wo Contrast  10/15/2013   CLINICAL DATA:  Slurred speech and left-sided weakness  EXAM: CT HEAD WITHOUT CONTRAST  TECHNIQUE: Contiguous axial images were obtained from the base of the skull through the vertex without intravenous contrast.  COMPARISON:  None.  FINDINGS: The ventricles are normal in size and configuration. There is no mass, hemorrhage, extra-axial fluid collection, or midline shift.  There is decreased attenuation in the region of the mid left extreme capsule and insular cortex, concerning for acute infarct in this area. Elsewhere gray-white compartments appear normal. Middle cerebral arteries appear symmetric with respect to attenuation bilaterally. Bony calvarium appears intact. The mastoid air cells are clear. There is mild mucosal thickening in the inferior right  maxillary antrum.  IMPRESSION: Decreased attenuation in the region of the left extreme capsule and insular cortex concerning for acute infarct in this area. No hemorrhage or  mass effect.   Electronically Signed   By: Bretta BangWilliam  Woodruff M.D.   On: 10/15/2013 14:06    Assessment: 58 y.o. male with multiple risk factors for stroke presenting with recurrent acute left subcortical ischemic infarction.  Stroke Risk Factors - diabetes mellitus, hyperlipidemia and hypertension  Plan: 1. HgbA1c, fasting lipid panel 2. MRI, MRA  of the brain without contrast 3. PT consult, OT consult, Speech consult 4. Echocardiogram 5. Carotid dopplers 6. Prophylactic therapy-Antiplatelet med: Aspirin 325 mg per day by mouth 300 mg daily per rectum 7. Risk factor modification 8. Telemetry monitoring   C.R. Roseanne RenoStewart, MD [facility] [phone]  10/15/2013, 2:50 PM

## 2013-10-15 NOTE — Plan of Care (Signed)
Pt enrolled in Socrates trial. Randomized to either ASA 300mg  daily or ticargrelor 90mg  bid group. Pt received loading dose at 20:10. For the trial, will stop the current orders of ASA 325mg  daily and lovenox 40mg  subq daily.   [name], MD PhD Stroke Neurology 10/15/2013 8:17 PM

## 2013-10-16 ENCOUNTER — Encounter (HOSPITAL_COMMUNITY): Payer: Self-pay | Admitting: Radiology

## 2013-10-16 ENCOUNTER — Inpatient Hospital Stay (HOSPITAL_COMMUNITY): Payer: Medicare Other

## 2013-10-16 DIAGNOSIS — I517 Cardiomegaly: Secondary | ICD-10-CM

## 2013-10-16 DIAGNOSIS — E785 Hyperlipidemia, unspecified: Secondary | ICD-10-CM

## 2013-10-16 DIAGNOSIS — Z559 Problems related to education and literacy, unspecified: Secondary | ICD-10-CM

## 2013-10-16 LAB — RAPID URINE DRUG SCREEN, HOSP PERFORMED
AMPHETAMINES: NOT DETECTED
Barbiturates: NOT DETECTED
Benzodiazepines: POSITIVE — AB
Cocaine: NOT DETECTED
OPIATES: NOT DETECTED
Tetrahydrocannabinol: POSITIVE — AB

## 2013-10-16 LAB — GLUCOSE, CAPILLARY
GLUCOSE-CAPILLARY: 104 mg/dL — AB (ref 70–99)
Glucose-Capillary: 103 mg/dL — ABNORMAL HIGH (ref 70–99)
Glucose-Capillary: 101 mg/dL — ABNORMAL HIGH (ref 70–99)
Glucose-Capillary: 86 mg/dL (ref 70–99)

## 2013-10-16 LAB — URINALYSIS, ROUTINE W REFLEX MICROSCOPIC
Bilirubin Urine: NEGATIVE
GLUCOSE, UA: NEGATIVE mg/dL
Hgb urine dipstick: NEGATIVE
Ketones, ur: NEGATIVE mg/dL
LEUKOCYTES UA: NEGATIVE
Nitrite: NEGATIVE
PROTEIN: NEGATIVE mg/dL
Specific Gravity, Urine: 1.013 (ref 1.005–1.030)
UROBILINOGEN UA: 0.2 mg/dL (ref 0.0–1.0)
pH: 6 (ref 5.0–8.0)

## 2013-10-16 LAB — HEMOGLOBIN A1C
Hgb A1c MFr Bld: 6.2 % — ABNORMAL HIGH (ref <5.7)
Mean Plasma Glucose: 131 mg/dL — ABNORMAL HIGH (ref <117)

## 2013-10-16 LAB — LIPID PANEL
Cholesterol: 224 mg/dL — ABNORMAL HIGH (ref 0–200)
HDL: 32 mg/dL — ABNORMAL LOW (ref >39)
LDL CALC: 151 mg/dL — AB (ref 0–99)
Total CHOL/HDL Ratio: 7 RATIO
Triglycerides: 205 mg/dL — ABNORMAL HIGH (ref <150)
VLDL: 41 mg/dL — ABNORMAL HIGH (ref 0–40)

## 2013-10-16 MED ORDER — NICOTINE 21 MG/24HR TD PT24
21.0000 mg | MEDICATED_PATCH | Freq: Every day | TRANSDERMAL | Status: DC
  Administered 2013-10-16 – 2013-10-17 (×2): 21 mg via TRANSDERMAL
  Filled 2013-10-16 (×2): qty 1

## 2013-10-16 MED ORDER — IOHEXOL 300 MG/ML  SOLN
50.0000 mL | Freq: Once | INTRAMUSCULAR | Status: AC | PRN
  Administered 2013-10-16: 50 mL via INTRAVENOUS

## 2013-10-16 NOTE — Progress Notes (Signed)
[facility] PROGRESS NOTE  [name] ZOX:[id number] DOB: 30-Jun-1955 DOA: 10/15/2013 PCP: No PCP Per Patient Interim summary: [name] is a 58 y.o. male with PMH of CAD s/p CABG, HTN, DM, h/o CVA presented with L sided weakness, and slurred speech. He was admitted for evaluation of CVA. Neurology on board. Carotid duplex completed showed  Right: 40-59% internal carotid artery stenosis. Left: 1-39% ICA stenosis. Bilateral: Vertebral artery flow is antegrade. Echocardiogram done and pending. MRI & MRA pending.    Assessment/Plan: Suspected CVA: Stroke work up in progress. Neurology consulted. Currently on Socrates trial . LDL 151. Start him on statin. hgba1c is pending.    Hypertension: Controlled.   Mild renal insufficiency: Continue to monitor.    Code Status: full code.  Family Communication: none atbedside Disposition Plan: pending further investigation.    Consultants:  Neurology.   Procedures:  MRI BRAIN  MRA Head and neck  Echocardiogram  Carotid duplex.   Antibiotics:  none  HPI/Subjective: Comfortable. No new complaints.   Objective: Filed Vitals:   10/16/13 1355  BP: 132/76  Pulse: 86  Temp: 98.1 F (36.7 C)  Resp: 16    Intake/Output Summary (Last 24 hours) at 10/16/13 1414 Last data filed at 10/16/13 0900  Gross per 24 hour  Intake    360 ml  Output    700 ml  Net   -340 ml   Filed Weights   10/15/13 1423  Weight: 91.2 kg (201 lb 1 oz)    Exam:   General:  Alert afebrile not in any distress  Cardiovascular: normal s1s2, regular rate and rhythm, no  murmers  Respiratory: chest clear to auscultation, no wheezing or rhonchi  Abdomen: soft non tender non distended, bowel sounds heard  Musculoskeletal: no pedal edema.   Data Reviewed: Basic Metabolic Panel:  Recent Labs Lab 10/15/13 1349 10/15/13 1355  NA 134* 133*  K 4.5 3.9  CL 95* 98  CO2 26  --   GLUCOSE 104* 109*  BUN 14 14  CREATININE 1.22  1.40*  CALCIUM 9.2  --    Liver Function Tests:  Recent Labs Lab 10/15/13 1349  AST 10  ALT 7  ALKPHOS 62  BILITOT 0.2*  PROT 6.7  ALBUMIN 3.5   No results found for this basename: LIPASE, AMYLASE,  in the last 168 hours No results found for this basename: AMMONIA,  in the last 168 hours CBC:  Recent Labs Lab 10/15/13 1349 10/15/13 1355  WBC 5.1  --   NEUTROABS 2.6  --   HGB 14.2 14.6  HCT 40.7 43.0  MCV 87.7  --   PLT 196  --    Cardiac Enzymes: No results found for this basename: CKTOTAL, CKMB, CKMBINDEX, TROPONINI,  in the last 168 hours BNP (last 3 results) No results found for this basename: PROBNP,  in the last 8760 hours CBG:  Recent Labs Lab 10/15/13 1708 10/15/13 2126 10/16/13 0636 10/16/13 1150  GLUCAP 95 89 103* 101*    No results found for this or any previous visit (from the past 240 hour(s)).   Studies: Dg Chest 2 View  10/15/2013   CLINICAL DATA:  Stroke.  EXAM: CHEST  2 VIEW  COMPARISON:  None.  FINDINGS: Previous median sternotomy and CABG procedure. The heart size and mediastinal contours are within normal limits. Both lungs are clear. The visualized skeletal structures are unremarkable.  IMPRESSION: No active cardiopulmonary disease.   Electronically Signed   By: Signa Kell  M.D.   On: 10/15/2013 21:48   Ct Head Wo Contrast  10/15/2013   CLINICAL DATA:  Slurred speech and left-sided weakness  EXAM: CT HEAD WITHOUT CONTRAST  TECHNIQUE: Contiguous axial images were obtained from the base of the skull through the vertex without intravenous contrast.  COMPARISON:  None.  FINDINGS: The ventricles are normal in size and configuration. There is no mass, hemorrhage, extra-axial fluid collection, or midline shift.  There is decreased attenuation in the region of the mid left extreme capsule and insular cortex, concerning for acute infarct in this area. Elsewhere gray-white compartments appear normal. Middle cerebral arteries appear symmetric with  respect to attenuation bilaterally. Bony calvarium appears intact. The mastoid air cells are clear. There is mild mucosal thickening in the inferior right maxillary antrum.  IMPRESSION: Decreased attenuation in the region of the left extreme capsule and insular cortex concerning for acute infarct in this area. No hemorrhage or mass effect.   Electronically Signed   By: Bretta Bang M.D.   On: 10/15/2013 14:06    Scheduled Meds: . ALPRAZolam  1 mg Oral TID  . DULoxetine  60 mg Oral BID  . gabapentin  800 mg Oral TID  . insulin aspart  0-9 Units Subcutaneous TID WC  . nicotine  21 mg Transdermal Daily  . research study medication  100 mg Oral Daily  . research study medication  90 mg Oral BID  . simvastatin  40 mg Oral q1800   Continuous Infusions:   Principal Problem:   CVA (cerebral infarction) Active Problems:   Hypertension   Diabetes mellitus    Time spent: 30 minutes.    [facility]  [facility] Pager [phone] If 7PM-7AM, please contact night-coverage at [email or web address], password [facility] 10/16/2013, 2:14 PM  LOS: 1 day
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
## 2013-10-16 NOTE — Progress Notes (Signed)
  Echocardiogram 2D Echocardiogram has been performed.  [name] FRANCES 10/16/2013, 12:16 PM
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
## 2013-10-16 NOTE — Progress Notes (Signed)
STROKE TEAM PROGRESS NOTE   HISTORY [name] is a 58 y.o. male with a history of previous stroke, diabetes mellitus, hyperlipidemia, hypertension and myocardial infarction, presenting with new onset weakness involving his left side. Deficits were present on waking up this morning. He was last known well at bedtime at 10 PM last night. He has not been on antiplatelet therapy. CT scan of his head showed an equivocal left cortical acute small infarct. No acute abnormality was seen on the right. NIH stroke score was 5.   LSN: 10 PM on 10/14/2013  tPA Given: No: Beyond time under for treatment consideration  MRankin: 2   SUBJECTIVE (INTERVAL HISTORY) There are no family members present today. The patient feels that this left hemiparesis has improved significantly. Smoking cessation was discussed and the patient requests nicotine patches. He is very anxious for discharge because he has animals at home that require his care. The patient stated that he would sign out AMA tomorrow if not discharged. Dr Roda Shutters recommended a social work consult to see if they can assist with this matter.   OBJECTIVE  Recent Labs Lab 10/15/13 1708 10/15/13 2126 10/16/13 0636 10/16/13 1150  GLUCAP 95 89 103* 101*    Recent Labs Lab 10/15/13 1349 10/15/13 1355  NA 134* 133*  K 4.5 3.9  CL 95* 98  CO2 26  --   GLUCOSE 104* 109*  BUN 14 14  CREATININE 1.22 1.40*  CALCIUM 9.2  --     Recent Labs Lab 10/15/13 1349  AST 10  ALT 7  ALKPHOS 62  BILITOT 0.2*  PROT 6.7  ALBUMIN 3.5    Recent Labs Lab 10/15/13 1349 10/15/13 1355  WBC 5.1  --   NEUTROABS 2.6  --   HGB 14.2 14.6  HCT 40.7 43.0  MCV 87.7  --   PLT 196  --    No results found for this basename: CKTOTAL, CKMB, CKMBINDEX, TROPONINI,  in the last 168 hours  Recent Labs  10/15/13 1349  LABPROT 12.6  INR 0.94    Recent Labs  10/16/13 0837  COLORURINE YELLOW  LABSPEC 1.013  PHURINE 6.0  GLUCOSEU NEGATIVE  HGBUR  NEGATIVE  BILIRUBINUR NEGATIVE  KETONESUR NEGATIVE  PROTEINUR NEGATIVE  UROBILINOGEN 0.2  NITRITE NEGATIVE  LEUKOCYTESUR NEGATIVE       Component Value Date/Time   CHOL 224* 10/16/2013 0355   TRIG 205* 10/16/2013 0355   HDL 32* 10/16/2013 0355   CHOLHDL 7.0 10/16/2013 0355   VLDL 41* 10/16/2013 0355   LDLCALC 151* 10/16/2013 0355   Lab Results  Component Value Date   HGBA1C 6.2* 10/16/2013      Component Value Date/Time   LABOPIA NONE DETECTED 10/16/2013 0840     Recent Labs Lab 10/15/13 1349  ETH <11    Dg Chest 2 View 10/15/2013    Previous median sternotomy and no active cardiopulmonary disease.     Ct Head Wo Contrast 10/15/2013    Decreased attenuation in the region of the left extreme capsule and insular cortex concerning for acute infarct in this area. No hemorrhage or mass effect.     CUS - Right: 40-59% internal carotid artery stenosis. Left: 1-39% ICA stenosis. Bilateral: Vertebral artery flow is antegrade  2D echo - Left ventricle: The cavity size was normal. Wall thickness was increased in a pattern of mild LVH. Systolic function was normal. The estimated ejection fraction was in the range of 50% to 55%. Wall motion was normal; there were  no regional wall motion abnormalities. Doppler parameters are consistent with abnormal left ventricular relaxation (grade 1 diastolic dysfunction). - Aortic root: The aortic root was mildly dilated. - Ascending aorta: The ascending aorta was mildly dilated. - Right atrium: The atrium was mildly dilated. - Atrial septum: No defect or patent foramen ovale was identified.   PHYSICAL EXAM  Temp:  [98 F (36.7 C)-98.3 F (36.8 C)] 98.1 F (36.7 C) (08/23 1355) Pulse Rate:  [72-86] 86 (08/23 1355) Resp:  [16-18] 16 (08/23 1355) BP: (132-146)/(75-97) 132/76 mmHg (08/23 1355) SpO2:  [95 %-98 %] 95 % (08/23 1355)  General - Well nourished, well developed, in no apparent distress.  Ophthalmologic - not able to see  through.  Cardiovascular - Regular rate and rhythm with no murmur.  Mental Status -  Level of arousal and orientation to time, place, and person were intact. Language including expression, naming, repetition, comprehension was assessed and found intact, but moderately dysarthric.  Cranial Nerves II - XII - II - Visual field intact OU. III, IV, VI - Extraocular movements intact. V - Facial sensation intact bilaterally. VII - left facial droop. VIII - Hearing & vestibular intact bilaterally. X - moderately dysarthric XI - Chin turning & shoulder shrug intact bilaterally. XII - Tongue protrusion intact.  Motor Strength - The patient's strength was 4/5 LUE and 4+/5 LLE and pronator drift was present.  Bulk was normal and fasciculations were absent.   Motor Tone - Muscle tone was assessed at the neck and appendages and was normal.  Reflexes - The patient's reflexes were normal in all extremities and he had no pathological reflexes.  Sensory - Light touch, temperature/pinprick were assessed and were normal.    Coordination - The patient had normal movements in the hands and feet with no ataxia or dysmetria.  Tremor was absent.  Gait and Station - not tested.   ASSESSMENT/PLAN  Mr. [name] is a 58 y.o. male with hx of previous stroke, diabetes mellitus, hyperlipidemia, hypertension, coronary artery disease with MI and CABG, and ongoing tobacco use, presenting with left hemiparesis. He did not receive IV t-PA  due to late presentation. Head CT - decreased attenuation in the region of the left extreme capsule and insular cortex concerning for acute infarct in this area, but it is the wrong side. MRI pending, expect right BG lacunar stroke.  Will do CTA head and neck since he may need CEA on the right ICA.  Stroke - expect right BG lacunar stroke      No antithrombotics prior to admission, now on Socrates study drug  MRI pending  CTA head and neck pending  2D Echo  unremarkable  Carotid right ICA 40-59% stenosis  LDL 121  HgbA1c 6.2  SCDs for VTE prophylaxis  Carb Control thin liquids.   Bedrest  Therapy needs:  Pending  Smoking cessation - counseling provided and ordered nicotine patch  Patient counseled to be compliant with his antithrombotic medications  Disposition:  Pending  Hypertension   Home meds:  No antihypertensive medications prior to admission. Now permissive hypertension.  BP 119 - 168 / 75 - 91  past 24h  SBP goal <140/90  Stable  Patient counseled to be compliant with his blood pressure medications  Hyperlipidemia  Cholesterol 224; LDL 121    No statin prior to admission - now on Zocor 40 mg daily.  LDL goal < 100  Other Stroke Risk Factors Cigarette smoker, advised to stop smoking. Nicotine patch is ordered.  Overweight, Body mass index is 28.05 kg/(m^2).    Hx stroke/TIA   Coronary artery disease  Other Active Problems  Mild Renal insufficiency - creatinine 1.4, repeat in am  Mild hyponatremia - sodium 133  Other Pertinent History  Social work consult requested.  Hospital day # 1  Delton See PA-C [facility] Pager [phone] 10/16/2013, 4:36 PM  I, the attending vascular neurologist, have personally obtained a history, examined the patient, evaluated laboratory data, individually viewed imaging studies, and formulated the assessment and plan of care.  I have made any additions or clarifications directly to the above note and agree with the findings and plan as currently documented.   Marvel Plan, MD PhD Stroke Neurology 10/16/2013 4:44 PM    To contact Stroke Continuity provider, please refer to [email or web address]. After hours, contact General Neurology

## 2013-10-16 NOTE — Progress Notes (Signed)
VASCULAR LAB PRELIMINARY  PRELIMINARY  PRELIMINARY  PRELIMINARY  Carotid duplex  completed.    Preliminary report:  Right:  40-59% internal carotid artery stenosis.   Left:  1-39% ICA stenosis. Bilateral:  Vertebral artery flow is antegrade.     Sahej Hauswirth, RVT 10/16/2013, 12:17 PM

## 2013-10-17 ENCOUNTER — Inpatient Hospital Stay (HOSPITAL_COMMUNITY): Payer: Medicare Other

## 2013-10-17 LAB — CBC
HCT: 37.9 % — ABNORMAL LOW (ref 39.0–52.0)
Hemoglobin: 12.6 g/dL — ABNORMAL LOW (ref 13.0–17.0)
MCH: 29.4 pg (ref 26.0–34.0)
MCHC: 33.2 g/dL (ref 30.0–36.0)
MCV: 88.3 fL (ref 78.0–100.0)
PLATELETS: 178 10*3/uL (ref 150–400)
RBC: 4.29 MIL/uL (ref 4.22–5.81)
RDW: 14.5 % (ref 11.5–15.5)
WBC: 4.1 10*3/uL (ref 4.0–10.5)

## 2013-10-17 LAB — GLUCOSE, CAPILLARY: Glucose-Capillary: 116 mg/dL — ABNORMAL HIGH (ref 70–99)

## 2013-10-17 LAB — HEMOGLOBIN A1C
HEMOGLOBIN A1C: 6.3 % — AB (ref <5.7)
MEAN PLASMA GLUCOSE: 134 mg/dL — AB (ref <117)

## 2013-10-17 LAB — BASIC METABOLIC PANEL
ANION GAP: 9 (ref 5–15)
BUN: 14 mg/dL (ref 6–23)
CHLORIDE: 101 meq/L (ref 96–112)
CO2: 29 mEq/L (ref 19–32)
Calcium: 9 mg/dL (ref 8.4–10.5)
Creatinine, Ser: 1.15 mg/dL (ref 0.50–1.35)
GFR calc non Af Amer: 70 mL/min — ABNORMAL LOW (ref >90)
GFR, EST AFRICAN AMERICAN: 81 mL/min — AB (ref >90)
Glucose, Bld: 97 mg/dL (ref 70–99)
Potassium: 4.4 mEq/L (ref 3.7–5.3)
Sodium: 139 mEq/L (ref 137–147)

## 2013-10-17 MED ORDER — STUDY - INVESTIGATIONAL DRUG SIMPLE RECORD: 100.0000 mg | Freq: Every day | Status: DC

## 2013-10-17 MED ORDER — METFORMIN HCL 500 MG PO TABS: 500.0000 mg | ORAL_TABLET | Freq: Every day | ORAL | Status: DC

## 2013-10-17 MED ORDER — STUDY - INVESTIGATIONAL DRUG SIMPLE RECORD: 90.0000 mg | Freq: Two times a day (BID) | Status: DC

## 2013-10-17 MED ORDER — SIMVASTATIN 40 MG PO TABS: 40.0000 mg | ORAL_TABLET | Freq: Every day | ORAL | Status: DC

## 2013-10-17 NOTE — Evaluation (Signed)
Speech Language Pathology Evaluation Patient Details Name: [name] MRN: [id number] DOB: September 24, 1955 Today's Date: 10/17/2013 Time:  -     Problem List:  Patient Active Problem List   Diagnosis Date Noted  . CVA (cerebral infarction) 10/15/2013  . Hypertension 10/15/2013  . Diabetes mellitus 10/15/2013   Past Medical History:  Past Medical History  Diagnosis Date  . Stroke   . Diabetes mellitus without complication   . Hypertension    Past Surgical History: History reviewed. No pertinent past surgical history. HPI:  [name] is a 58 y.o. male with PMH of CAD s/p CABG, HTN, DM, h/o CVA presented with L sided weakness, and slurred speech. He was admitted for evaluation of CVA. Acute infarction affecting the right side of the pons. Small acute infarctions elsewhere in both cerebral hemispheres.   Assessment / Plan / Recommendation Clinical Impression  Pt demonstrates a mild to moderate dysarthria resulting from acute CVA. Pt admits to mild cognitive deficits at baseline, but has appropriate levels of assist in place at home to compensate. During assessment pt demosntrates adequate linguistic function and is able to carry out basic functional tasks without difficulty. Dysarthria remediated by minimal verbal cues for compensatory strategies at conversation and reading level. Will provide written instruction for home carryover, recommend f/u with home health SLP therapy for further treatment. All acute needs met.     SLP Assessment  All further Speech Lanaguage Pathology  needs can be addressed in the next venue of care    Follow Up Recommendations  Home health SLP    Frequency and Duration        Pertinent Vitals/Pain Pain Assessment: No/denies pain   SLP Goals  SLP Goals Potential Considerations: Previous level of function;Ability to learn/carryover information  SLP Evaluation Prior Functioning  Cognitive/Linguistic Baseline: Baseline deficits Baseline  deficit details: memory, pt has assist for finances, medication at home Type of Home: Apartment  Lives With: Alone Available Help at Discharge: Home health Vocation: On disability   Cognition  Overall Cognitive Status: History of cognitive impairments - at baseline (Memory, pt demosntrates adequate function) Orientation Level: Oriented X4    Comprehension  Auditory Comprehension Overall Auditory Comprehension: Appears within functional limits for tasks assessed Reading Comprehension Reading Status: Within funtional limits    Expression Expression Primary Mode of Expression: Verbal Verbal Expression Overall Verbal Expression: Appears within functional limits for tasks assessed Written Expression Dominant Hand: Right   Oral / Motor Oral Motor/Sensory Function Overall Oral Motor/Sensory Function: Impaired Labial ROM: Reduced left Labial Symmetry: Abnormal symmetry left Labial Strength: Reduced Lingual ROM: Reduced left Lingual Symmetry: Abnormal symmetry left Lingual Strength: Reduced Facial ROM: Within Functional Limits Motor Speech Overall Motor Speech: Impaired Respiration: Within functional limits Phonation: Normal Resonance: Within functional limits Articulation: Impaired Level of Impairment: Phrase Intelligibility: Intelligible Motor Planning: Witnin functional limits   GO    [employer], MA CCC-SLP [phone]  Lynann Beaver 10/17/2013, 10:49 AM

## 2013-10-17 NOTE — Progress Notes (Signed)
Discharge orders received, pt for discharge home today, IV and telemetry D/C, D/C instructions and Rx given with verbalized understanding.  Family at bedside to assist pt with discharge. Staff brought pt downstairs via wheelchair.  

## 2013-10-17 NOTE — Progress Notes (Addendum)
STROKE TEAM PROGRESS NOTE   HISTORY [name] is a 58 y.o. male with a history of previous stroke, diabetes mellitus, hyperlipidemia, hypertension and myocardial infarction, presenting with new onset weakness involving his left side. Deficits were present on waking up this morning. He was last known well at bedtime at 10 PM last night 10/14/2013. He has not been on antiplatelet therapy. CT scan of his head showed an equivocal left cortical acute small infarct. No acute abnormality was seen on the right. NIH stroke score was 5. He was not a tPA candidate due to presentation beyond time under for treatment consideration.    SUBJECTIVE (INTERVAL HISTORY) There are no family members present today. He is very anxious for discharge because he has animals at home that require his care. The patient stated that he would sign out AMA tomorrow if not discharged. Dr Roda Shutters recommended a social work consult to see if they can assist with this matter.   OBJECTIVE Temp:  [98.3 F (36.8 C)-98.6 F (37 C)] 98.3 F (36.8 C) (08/24 1003) Pulse Rate:  [73-82] 75 (08/24 1003) Cardiac Rhythm:  [-] Normal sinus rhythm (08/24 0900) Resp:  [16-18] 18 (08/24 1003) BP: (120-140)/(53-79) 123/79 mmHg (08/24 1003) SpO2:  [95 %-97 %] 96 % (08/24 1003)   Recent Labs Lab 10/16/13 0636 10/16/13 1150 10/16/13 1646 10/16/13 2159 10/17/13 0643  GLUCAP 103* 101* 86 104* 116*    Recent Labs Lab 10/15/13 1349 10/15/13 1355 10/17/13 0350  NA 134* 133* 139  K 4.5 3.9 4.4  CL 95* 98 101  CO2 26  --  29  GLUCOSE 104* 109* 97  BUN 14 14 14   CREATININE 1.22 1.40* 1.15  CALCIUM 9.2  --  9.0    Recent Labs Lab 10/15/13 1349  AST 10  ALT 7  ALKPHOS 62  BILITOT 0.2*  PROT 6.7  ALBUMIN 3.5    Recent Labs Lab 10/15/13 1349 10/15/13 1355 10/17/13 0350  WBC 5.1  --  4.1  NEUTROABS 2.6  --   --   HGB 14.2 14.6 12.6*  HCT 40.7 43.0 37.9*  MCV 87.7  --  88.3  PLT 196  --  178   No results found for  this basename: CKTOTAL, CKMB, CKMBINDEX, TROPONINI,  in the last 168 hours  Recent Labs  10/15/13 1349  LABPROT 12.6  INR 0.94    Recent Labs  10/16/13 0837  COLORURINE YELLOW  LABSPEC 1.013  PHURINE 6.0  GLUCOSEU NEGATIVE  HGBUR NEGATIVE  BILIRUBINUR NEGATIVE  KETONESUR NEGATIVE  PROTEINUR NEGATIVE  UROBILINOGEN 0.2  NITRITE NEGATIVE  LEUKOCYTESUR NEGATIVE       Component Value Date/Time   CHOL 224* 10/16/2013 0355   TRIG 205* 10/16/2013 0355   HDL 32* 10/16/2013 0355   CHOLHDL 7.0 10/16/2013 0355   VLDL 41* 10/16/2013 0355   LDLCALC 151* 10/16/2013 0355   Lab Results  Component Value Date   HGBA1C 6.2* 10/16/2013      Component Value Date/Time   LABOPIA NONE DETECTED 10/16/2013 0840     Recent Labs Lab 10/15/13 1349  ETH <11    Ct Angio Head W/cm &/or Wo Cm  10/16/2013   CLINICAL DATA:  Slurred speech and left-sided weakness. Hypertension and diabetes.  EXAM: CT ANGIOGRAPHY HEAD AND NECK  TECHNIQUE: Multidetector CT imaging of the head and neck was performed using the standard protocol during bolus administration of intravenous contrast. Multiplanar CT image reconstructions and MIPs were obtained to evaluate the vascular anatomy. Carotid  stenosis measurements (when applicable) are obtained utilizing NASCET criteria, using the distal internal carotid diameter as the denominator.  CONTRAST:  50mL OMNIPAQUE IOHEXOL 300 MG/ML  SOLN  COMPARISON:  CT head 10/15/2013  FINDINGS: CTA HEAD FINDINGS  Subtle hypodensity in the left lower basal ganglia probably represents chronic ischemia. Questioned infarct in the extreme capsule on the left is less apparent on today's study. There is a chronic infarct in the right inferior cerebellum unchanged.  Ventricle size is normal. Negative for hemorrhage. No definite acute infarct by CT.  Distal right vertebral artery is occluded. Distal left vertebral artery occluded. Left vertebral artery ends in most of branch which supplies left PICA. The  basilar is extremely small with very little flow but is probably patent. Right PICA is a small vessel supplied from the basilar and left PICA. Posterior communicating arteries are patent and contribute to the posterior circulation. Diffuse atherosclerotic disease in the posterior cerebral artery bilaterally right greater than left with possible occlusion of the right PCA.  Extensive atherosclerotic calcification in the cavernous carotid bilaterally with mild to moderate stenosis bilaterally. Anterior and middle cerebral arteries are patent bilaterally. No filling defect or occlusion.  Negative for cerebral aneurysm.  Review of the MIP images confirms the above findings.  CTA NECK FINDINGS  Left common carotid artery origin from the innominate artery. Proximal great vessels are patent.  Right carotid: Diffuse atherosclerotic disease in the common carotid artery with mild stenosis. Calcified and noncalcified plaque in the carotid bifurcation. 20% diameter stenosis right internal carotid artery. 90% stenosis proximal right external carotid artery.  Left carotid: Mild atherosclerotic disease in the common carotid artery. Calcified and predominant noncalcified plaque in the carotid bulb. 60% diameter stenosis of the left internal carotid artery at the bulb. Left external carotid artery widely patent.  Right vertebral artery occludes at approximately C4. No significant reconstitution of the right vertebral artery until PICA.  Left vertebral artery origin is patent. Left vertebral artery is mildly diseased and appears to terminate in a muscular branch which than supplies left PICA.  Review of the MIP images confirms the above findings.  IMPRESSION: No definite acute infarct by CT. MRI may be helpful to evaluate for acute infarction. Chronic small infarct right PICA territory.  Severe posterior circulation disease. Occlusion of both distal vertebral arteries with some reconstitution of a very small left PICA via muscular  collaterals from the left vertebral artery. Severe disease in the basilar which has high-grade stenosis and minimal flow. Posterior circulation is supplied via muscular collaterals from left vertebral artery as well as the posterior communicating arteries. There is diffuse disease in the posterior cerebral arteries bilaterally.  Atherosclerotic disease in the cavernous carotid bilaterally with mild to moderate stenosis. Anterior and middle cerebral arteries are patent bilaterally.  The right vertebral artery is occluded at C4 and does not reconstitute  20% diameter stenosis right internal carotid artery and 90% stenosis proximal right external carotid artery.  60% diameter stenosis left internal carotid artery due to soft plaque.   Electronically Signed   By: Marlan Palau M.D.   On: 10/16/2013 19:21   Dg Chest 2 View  10/15/2013   CLINICAL DATA:  Stroke.  EXAM: CHEST  2 VIEW  COMPARISON:  None.  FINDINGS: Previous median sternotomy and CABG procedure. The heart size and mediastinal contours are within normal limits. Both lungs are clear. The visualized skeletal structures are unremarkable.  IMPRESSION: No active cardiopulmonary disease.   Electronically Signed   By: Ladona Ridgel  Bradly Chris M.D.   On: 10/15/2013 21:48   Ct Head Wo Contrast  10/15/2013   CLINICAL DATA:  Slurred speech and left-sided weakness  EXAM: CT HEAD WITHOUT CONTRAST  TECHNIQUE: Contiguous axial images were obtained from the base of the skull through the vertex without intravenous contrast.  COMPARISON:  None.  FINDINGS: The ventricles are normal in size and configuration. There is no mass, hemorrhage, extra-axial fluid collection, or midline shift.  There is decreased attenuation in the region of the mid left extreme capsule and insular cortex, concerning for acute infarct in this area. Elsewhere gray-white compartments appear normal. Middle cerebral arteries appear symmetric with respect to attenuation bilaterally. Bony calvarium appears  intact. The mastoid air cells are clear. There is mild mucosal thickening in the inferior right maxillary antrum.  IMPRESSION: Decreased attenuation in the region of the left extreme capsule and insular cortex concerning for acute infarct in this area. No hemorrhage or mass effect.   Electronically Signed   By: Bretta Bang M.D.   On: 10/15/2013 14:06   Ct Angio Neck W/cm &/or Wo/cm  10/16/2013   CLINICAL DATA:  Slurred speech and left-sided weakness. Hypertension and diabetes.  EXAM: CT ANGIOGRAPHY HEAD AND NECK  TECHNIQUE: Multidetector CT imaging of the head and neck was performed using the standard protocol during bolus administration of intravenous contrast. Multiplanar CT image reconstructions and MIPs were obtained to evaluate the vascular anatomy. Carotid stenosis measurements (when applicable) are obtained utilizing NASCET criteria, using the distal internal carotid diameter as the denominator.  CONTRAST:  50mL OMNIPAQUE IOHEXOL 300 MG/ML  SOLN  COMPARISON:  CT head 10/15/2013  FINDINGS: CTA HEAD FINDINGS  Subtle hypodensity in the left lower basal ganglia probably represents chronic ischemia. Questioned infarct in the extreme capsule on the left is less apparent on today's study. There is a chronic infarct in the right inferior cerebellum unchanged.  Ventricle size is normal. Negative for hemorrhage. No definite acute infarct by CT.  Distal right vertebral artery is occluded. Distal left vertebral artery occluded. Left vertebral artery ends in most of branch which supplies left PICA. The basilar is extremely small with very little flow but is probably patent. Right PICA is a small vessel supplied from the basilar and left PICA. Posterior communicating arteries are patent and contribute to the posterior circulation. Diffuse atherosclerotic disease in the posterior cerebral artery bilaterally right greater than left with possible occlusion of the right PCA.  Extensive atherosclerotic calcification in  the cavernous carotid bilaterally with mild to moderate stenosis bilaterally. Anterior and middle cerebral arteries are patent bilaterally. No filling defect or occlusion.  Negative for cerebral aneurysm.  Review of the MIP images confirms the above findings.  CTA NECK FINDINGS  Left common carotid artery origin from the innominate artery. Proximal great vessels are patent.  Right carotid: Diffuse atherosclerotic disease in the common carotid artery with mild stenosis. Calcified and noncalcified plaque in the carotid bifurcation. 20% diameter stenosis right internal carotid artery. 90% stenosis proximal right external carotid artery.  Left carotid: Mild atherosclerotic disease in the common carotid artery. Calcified and predominant noncalcified plaque in the carotid bulb. 60% diameter stenosis of the left internal carotid artery at the bulb. Left external carotid artery widely patent.  Right vertebral artery occludes at approximately C4. No significant reconstitution of the right vertebral artery until PICA.  Left vertebral artery origin is patent. Left vertebral artery is mildly diseased and appears to terminate in a muscular branch which than supplies left PICA.  Review of the MIP images confirms the above findings.  IMPRESSION: No definite acute infarct by CT. MRI may be helpful to evaluate for acute infarction. Chronic small infarct right PICA territory.  Severe posterior circulation disease. Occlusion of both distal vertebral arteries with some reconstitution of a very small left PICA via muscular collaterals from the left vertebral artery. Severe disease in the basilar which has high-grade stenosis and minimal flow. Posterior circulation is supplied via muscular collaterals from left vertebral artery as well as the posterior communicating arteries. There is diffuse disease in the posterior cerebral arteries bilaterally.  Atherosclerotic disease in the cavernous carotid bilaterally with mild to moderate  stenosis. Anterior and middle cerebral arteries are patent bilaterally.  The right vertebral artery is occluded at C4 and does not reconstitute  20% diameter stenosis right internal carotid artery and 90% stenosis proximal right external carotid artery.  60% diameter stenosis left internal carotid artery due to soft plaque.   Electronically Signed   By: Marlan Palau M.D.   On: 10/16/2013 19:21   Mr Brain Wo Contrast  10/17/2013   CLINICAL DATA:  Stroke.  Slurred speech.  Left-sided weakness.  EXAM: MRI HEAD WITHOUT CONTRAST  TECHNIQUE: Multiplanar, multiecho pulse sequences of the brain and surrounding structures were obtained without intravenous contrast.  COMPARISON:  10/15/2013.  10/16/2013.  FINDINGS: Diffusion imaging shows a 1-1.5 cm acute infarction within the right side of the pons. There are a few scattered punctate acute infarctions within the cerebellum bilaterally. There are some old or cerebellar infarctions bilaterally. No evidence of mass effect or hemorrhage.  The cerebral hemispheres are normal without evidence of old or acute infarction, mass lesion, hemorrhage, hydrocephalus or extra-axial collection.  Abnormal flows demonstrated in the vertebral and basilar arteries, as shown by a CT angiography.  No pituitary mass. No inflammatory sinus disease. No skull or skullbase lesion.  IMPRESSION: Acute infarction affecting the right side of the pons. Small acute infarctions elsewhere in both cerebral hemispheres. Old bilateral cerebellar infarctions present as well. Abnormal appearance of the flow in the vertebral and basilar arteries, better shown by CT angiography.   Electronically Signed   By: Paulina Fusi M.D.   On: 10/17/2013 09:39   CUS - Right: 40-59% internal carotid artery stenosis. Left: 1-39% ICA stenosis. Bilateral: Vertebral artery flow is antegrade  2D echo - Left ventricle: The cavity size was normal. Wall thickness was increased in a pattern of mild LVH. Systolic function was  normal. The estimated ejection fraction was in the range of 50% to 55%. Wall motion was normal; there were no regional wall motion abnormalities. Doppler parameters are consistent with abnormal left ventricular relaxation (grade 1 diastolic dysfunction). - Aortic root: The aortic root was mildly dilated. - Ascending aorta: The ascending aorta was mildly dilated. - Right atrium: The atrium was mildly dilated. - Atrial septum: No defect or patent foramen ovale was identified.   PHYSICAL EXAM  General - Well nourished, well developed, in no apparent distress.  Ophthalmologic - not able to see through.  Cardiovascular - Regular rate and rhythm with no murmur.  Mental Status -  Level of arousal and orientation to time, place, and person were intact. Language including expression, naming, repetition, comprehension was assessed and found intact, but moderately dysarthric.  Cranial Nerves II - XII - II - Visual field intact OU. III, IV, VI - Extraocular movements intact. V - Facial sensation intact bilaterally. VII - left facial droop. VIII - Hearing & vestibular intact bilaterally.  X - moderately dysarthric XI - Chin turning & shoulder shrug intact bilaterally. XII - Tongue protrusion intact.  Motor Strength - The patient's strength was 4/5 LUE and 4+/5 LLE and pronator drift was present.  Bulk was normal and fasciculations were absent.   Motor Tone - Muscle tone was assessed at the neck and appendages and was normal.  Reflexes - The patient's reflexes were normal in all extremities and he had no pathological reflexes.  Sensory - Light touch, temperature/pinprick were assessed and were normal.    Coordination - The patient had normal movements in the hands and feet with no ataxia or dysmetria.  Tremor was absent.  Gait and Station - not tested.   ASSESSMENT/PLAN Mr. Tramel Westbrook is a 58 y.o. male with hx of previous stroke, diabetes mellitus, hyperlipidemia, hypertension,  coronary artery disease with MI and CABG, and ongoing tobacco use, presenting with left hemiparesis. He did not receive IV t-PA  due to late presentation. MRI confirms R pontinue infarct and bilateral cerebellar acute infarcts in the setting of old bilateral cerebellar infarcts.   Stroke - R pontine, punctate bilateral cerebellar acute infarcts; old bilateral cerebellar infarcts    No antithrombotics prior to admission, enrolled in Socrates study drug. Will discharge home with study drug.  MRI - R pontine infarct, tiny bilateral cerebellar infarcts  CTA head and neck, Congenitally small posterior circulation no amenable to any type of intervention  2D Echo unremarkable  Carotid right ICA 40-59% stenosis  HgbA1c 6.2  SCDs for VTE prophylaxis  Carb Control thin liquids.   Bedrest, ok to be OOB  Therapy needs:  HH OT, PT  Smoking cessation - counseling provided and ordered nicotine patch  Patient counseled to be compliant with his antithrombotic medications  Disposition:  Home w/ therapies  Hypertension   Home meds:  No antihypertensive medications prior to admission. Now permissive hypertension.  BP 120-140 past 24h  Stable  Hyperlipidemia  Cholesterol 224; LDL 121    No statin prior to admission - added Zocor 40 mg daily.  LDL goal < 100  Other Stroke Risk Factors Cigarette smoker, advised to stop smoking. Nicotine patch is ordered. He states that he is not interested is stopping.   Overweight, Body mass index is 28.05 kg/(m^2).    Hx stroke/TIA   Coronary artery disease  Other Active Problems  Mild Renal insufficiency, resolved - creatinine 1.4->1.15  Mild hyponatremia, resolved - sodium 133->139  Hospital day # 2  Annie Main, MSN, RN, ANVP-BC, ANP-BC, [facility] Pager: [phone] 10/17/2013 10:43 AM I have personally examined this patient, reviewed notes, independently viewed imaging studies, participated in medical decision  making and plan of care. I have made any additions or clarifications directly to the above note. Agree with note above.Discharge home and followup as an outpatient as per Socrates trial protocol in 1 week.  Delia Heady, MD Medical Director [facility] Pager: [phone] 10/17/2013 4:08 PM   To contact Stroke Continuity provider, please refer to [email or web address]. After hours, contact General Neurology

## 2013-10-17 NOTE — Progress Notes (Signed)
Physical Therapy Treatment Patient Details Name: [name] MRN: [id number] DOB: Mar 09, 1955 Today's Date: 10/17/2013    History of Present Illness [name] is a 58 y.o. male with PMH of CAD s/p CABG, HTN, DM, h/o CVA presented with L sided weakness, and slurred speech, Deficits were present on waking up this morning; He reports intermittent similar symptoms with muscle weakness for 3-4 week without speech change. MRI showed acute infarct right pons as well as subacute infarcts cerebellum    PT Comments    Pt progressing but was more fatigued today than yesterday. Attempted ambulation with cane today but not a good AD for him with previous right hip injuries and not enough support to ambulate safely. Recommend consistent use of RW, pt will need a new one as his his old and has missing wheel. PT will continue to follow.   Follow Up Recommendations  Home health PT     Equipment Recommendations  Rolling walker with 5" wheels (his RW >5 yo and missing wheel)    Recommendations for Other Services       Precautions / Restrictions Precautions Precautions: Fall Precaution Comments: pt has h/o gait abnormality from [facility] with RLE injuries 30 years ago Restrictions Weight Bearing Restrictions: No    Mobility  Bed Mobility               General bed mobility comments: pt received in chair  Transfers Overall transfer level: Modified independent Equipment used: None Transfers: Sit to/from Stand Sit to Stand: Modified independent (Device/Increase time)         General transfer comment: pt stood safely with no LOB  Ambulation/Gait Ambulation/Gait assistance: Supervision;Min assist Ambulation Distance (Feet): 250 Feet Assistive device: Rolling walker (2 wheeled);Straight cane Gait Pattern/deviations: Decreased weight shift to right;Trendelenburg;Step-through pattern Gait velocity: decreased   General Gait Details: tried cane today in pt's right hand (cannot  hold it in left sue to old left wrist injury), pt did not do well with this. Required min A, demonstrated antalgic pattern with right hip pain, he reported feeling off balance with cane. Did not walk as well with cane today as he walked with light HHA yesterday. Steadier with RW, supervision only. Worked on increasing stride length. Pt fatigued at end of walk.   Stairs            Wheelchair Mobility    Modified Rankin (Stroke Patients Only)       Balance Overall balance assessment: Needs assistance         Standing balance support: No upper extremity supported;During functional activity Standing balance-Leahy Scale: Good Standing balance comment: worked on high level balance activities in standing, required UE support for most due to right hip discomfort.                    Cognition Arousal/Alertness: Awake/alert Behavior During Therapy: WFL for tasks assessed/performed Overall Cognitive Status: Within Functional Limits for tasks assessed                      Exercises General Exercises - Lower Extremity Hip Flexion/Marching: AROM;Both;10 reps;Standing Other Exercises Other Exercises: seated calf stretch with sheet x 30 sec right and left    General Comments General comments (skin integrity, edema, etc.): pt's speech clearer today but activity tolerance decreased      Pertinent Vitals/Pain Pain Assessment: Faces Faces Pain Scale: Hurts even more Pain Location: right hip, hurts him frequently with mobility Pain Intervention(s): Limited activity  within patient's tolerance    Home Living     Available Help at Discharge: Home health Type of Home: Apartment              Prior Function            PT Goals (current goals can now be found in the care plan section) Acute Rehab PT Goals Patient Stated Goal: return home PT Goal Formulation: With patient Time For Goal Achievement: 10/23/13 Potential to Achieve Goals: Good Progress towards PT  goals: Progressing toward goals    Frequency  Min 4X/week    PT Plan Current plan remains appropriate    Co-evaluation             End of Session Equipment Utilized During Treatment: Gait belt Activity Tolerance: Patient limited by fatigue Patient left: in chair;with call bell/phone within reach     Time: 1105-1130 PT Time Calculation (min): 25 min  Charges:  $Gait Training: 8-22 mins $Therapeutic Activity: 8-22 mins                    G Codes:     Lyanne Co, PT  Acute Rehab Services  [phone]  [place], [place] 10/17/2013, 1:11 PM

## 2013-10-17 NOTE — Progress Notes (Signed)
[facility] PROGRESS NOTE  Kyel Purk ZOX:[id number] DOB: 04-07-1955 DOA: 10/15/2013 PCP: No PCP Per Patient Interim summary: Khoury Siemon is a 58 y.o. male with PMH of CAD s/p CABG, HTN, DM, h/o CVA presented with L sided weakness, and slurred speech. He was admitted for evaluation of CVA. Neurology on board. Carotid duplex completed showed  Right: 40-59% internal carotid artery stenosis. Left: 1-39% ICA stenosis. Bilateral: Vertebral artery flow is antegrade. Echocardiogram done and pending. CT angio of the head and neck did not reveal acute CVA, recommended MRI of the brain.    Assessment/Plan: Suspected CVA: CT angio of the head and neck did not reveal acute CVA, plan for ? MRI of the brain. Echocardiogram showed LVH, and LVEF of 50 to 55%, without regional wall abnormalities. Carotid duplex showed right side,  40 to 59% ICA stenosis and left 1 to 39 % stenosis. Neurology consulted. Currently on Socrates trial . LDL 151. Started him on statin. hgba1c is 6.2%.     Hypertension: Controlled.   Mild renal insufficiency: Resolved with hydration.  Continue to monitor.    Code Status: full code.  Family Communication: none atbedside Disposition Plan: pending further investigation.    Consultants:  Neurology.   Procedures:  CT angio of the head and neck.   Echocardiogram  Carotid duplex.   Antibiotics:  none  HPI/Subjective: Comfortable. No new complaints.   Objective: Filed Vitals:   10/17/13 0533  BP: 140/53  Pulse: 76  Temp: 98.5 F (36.9 C)  Resp: 16    Intake/Output Summary (Last 24 hours) at 10/17/13 0836 Last data filed at 10/17/13 8119  Gross per 24 hour  Intake    480 ml  Output      0 ml  Net    480 ml   Filed Weights   10/15/13 1423  Weight: 91.2 kg (201 lb 1 oz)    Exam:   General:  Alert afebrile not in any distress  Cardiovascular: normal s1s2, regular rate and rhythm, no  murmers  Respiratory: chest clear to  auscultation, no wheezing or rhonchi  Abdomen: soft non tender non distended, bowel sounds heard  Musculoskeletal: no pedal edema.   Neuro: left facial droop, and silghtly dysarthric, decreased strength on the left side.  Data Reviewed: Basic Metabolic Panel:  Recent Labs Lab 10/15/13 1349 10/15/13 1355 10/17/13 0350  NA 134* 133* 139  K 4.5 3.9 4.4  CL 95* 98 101  CO2 26  --  29  GLUCOSE 104* 109* 97  BUN CREATININE 1.22 1.40* 1.15  CALCIUM 9.2  --  9.0   Liver Function Tests:  Recent Labs Lab 10/15/13 1349  AST 10  ALT 7  ALKPHOS 62  BILITOT 0.2*  PROT 6.7  ALBUMIN 3.5   No results found for this basename: LIPASE, AMYLASE,  in the last 168 hours No results found for this basename: AMMONIA,  in the last 168 hours CBC:  Recent Labs Lab 10/15/13 1349 10/15/13 1355 10/17/13 0350  WBC 5.1  --  4.1  NEUTROABS 2.6  --   --   HGB 14.2 14.6 12.6*  HCT 40.7 43.0 37.9*  MCV 87.7  --  88.3  PLT 196  --  178   Cardiac Enzymes: No results found for this basename: CKTOTAL, CKMB, CKMBINDEX, TROPONINI,  in the last 168 hours BNP (last 3 results) No results found for this basename: PROBNP,  in the last 8760 hours CBG:  Recent Labs  Lab 10/16/13 0636 10/16/13 1150 10/16/13 1646 10/16/13 2159 10/17/13 0643  GLUCAP 103* 101* 86 104* 116*    No results found for this or any previous visit (from the past 240 hour(s)).   Studies: Ct Angio Head W/cm &/or Wo Cm  10/16/2013   CLINICAL DATA:  Slurred speech and left-sided weakness. Hypertension and diabetes.  EXAM: CT ANGIOGRAPHY HEAD AND NECK  TECHNIQUE: Multidetector CT imaging of the head and neck was performed using the standard protocol during bolus administration of intravenous contrast. Multiplanar CT image reconstructions and MIPs were obtained to evaluate the vascular anatomy. Carotid stenosis measurements (when applicable) are obtained utilizing NASCET criteria, using the distal internal carotid  diameter as the denominator.  CONTRAST:  50mL OMNIPAQUE IOHEXOL 300 MG/ML  SOLN  COMPARISON:  CT head 10/15/2013  FINDINGS: CTA HEAD FINDINGS  Subtle hypodensity in the left lower basal ganglia probably represents chronic ischemia. Questioned infarct in the extreme capsule on the left is less apparent on today's study. There is a chronic infarct in the right inferior cerebellum unchanged.  Ventricle size is normal. Negative for hemorrhage. No definite acute infarct by CT.  Distal right vertebral artery is occluded. Distal left vertebral artery occluded. Left vertebral artery ends in most of branch which supplies left PICA. The basilar is extremely small with very little flow but is probably patent. Right PICA is a small vessel supplied from the basilar and left PICA. Posterior communicating arteries are patent and contribute to the posterior circulation. Diffuse atherosclerotic disease in the posterior cerebral artery bilaterally right greater than left with possible occlusion of the right PCA.  Extensive atherosclerotic calcification in the cavernous carotid bilaterally with mild to moderate stenosis bilaterally. Anterior and middle cerebral arteries are patent bilaterally. No filling defect or occlusion.  Negative for cerebral aneurysm.  Review of the MIP images confirms the above findings.  CTA NECK FINDINGS  Left common carotid artery origin from the innominate artery. Proximal great vessels are patent.  Right carotid: Diffuse atherosclerotic disease in the common carotid artery with mild stenosis. Calcified and noncalcified plaque in the carotid bifurcation. 20% diameter stenosis right internal carotid artery. 90% stenosis proximal right external carotid artery.  Left carotid: Mild atherosclerotic disease in the common carotid artery. Calcified and predominant noncalcified plaque in the carotid bulb. 60% diameter stenosis of the left internal carotid artery at the bulb. Left external carotid artery widely  patent.  Right vertebral artery occludes at approximately C4. No significant reconstitution of the right vertebral artery until PICA.  Left vertebral artery origin is patent. Left vertebral artery is mildly diseased and appears to terminate in a muscular branch which than supplies left PICA.  Review of the MIP images confirms the above findings.  IMPRESSION: No definite acute infarct by CT. MRI may be helpful to evaluate for acute infarction. Chronic small infarct right PICA territory.  Severe posterior circulation disease. Occlusion of both distal vertebral arteries with some reconstitution of a very small left PICA via muscular collaterals from the left vertebral artery. Severe disease in the basilar which has high-grade stenosis and minimal flow. Posterior circulation is supplied via muscular collaterals from left vertebral artery as well as the posterior communicating arteries. There is diffuse disease in the posterior cerebral arteries bilaterally.  Atherosclerotic disease in the cavernous carotid bilaterally with mild to moderate stenosis. Anterior and middle cerebral arteries are patent bilaterally.  The right vertebral artery is occluded at C4 and does not reconstitute  20% diameter stenosis right internal carotid  artery and 90% stenosis proximal right external carotid artery.  60% diameter stenosis left internal carotid artery due to soft plaque.   Electronically Signed   By: Marlan Palau M.D.   On: 10/16/2013 19:21   Dg Chest 2 View  10/15/2013   CLINICAL DATA:  Stroke.  EXAM: CHEST  2 VIEW  COMPARISON:  None.  FINDINGS: Previous median sternotomy and CABG procedure. The heart size and mediastinal contours are within normal limits. Both lungs are clear. The visualized skeletal structures are unremarkable.  IMPRESSION: No active cardiopulmonary disease.   Electronically Signed   By: Signa Kell M.D.   On: 10/15/2013 21:48   Ct Head Wo Contrast  10/15/2013   CLINICAL DATA:  Slurred speech and  left-sided weakness  EXAM: CT HEAD WITHOUT CONTRAST  TECHNIQUE: Contiguous axial images were obtained from the base of the skull through the vertex without intravenous contrast.  COMPARISON:  None.  FINDINGS: The ventricles are normal in size and configuration. There is no mass, hemorrhage, extra-axial fluid collection, or midline shift.  There is decreased attenuation in the region of the mid left extreme capsule and insular cortex, concerning for acute infarct in this area. Elsewhere gray-white compartments appear normal. Middle cerebral arteries appear symmetric with respect to attenuation bilaterally. Bony calvarium appears intact. The mastoid air cells are clear. There is mild mucosal thickening in the inferior right maxillary antrum.  IMPRESSION: Decreased attenuation in the region of the left extreme capsule and insular cortex concerning for acute infarct in this area. No hemorrhage or mass effect.   Electronically Signed   By: Bretta Bang M.D.   On: 10/15/2013 14:06   Ct Angio Neck W/cm &/or Wo/cm  10/16/2013   CLINICAL DATA:  Slurred speech and left-sided weakness. Hypertension and diabetes.  EXAM: CT ANGIOGRAPHY HEAD AND NECK  TECHNIQUE: Multidetector CT imaging of the head and neck was performed using the standard protocol during bolus administration of intravenous contrast. Multiplanar CT image reconstructions and MIPs were obtained to evaluate the vascular anatomy. Carotid stenosis measurements (when applicable) are obtained utilizing NASCET criteria, using the distal internal carotid diameter as the denominator.  CONTRAST:  50mL OMNIPAQUE IOHEXOL 300 MG/ML  SOLN  COMPARISON:  CT head 10/15/2013  FINDINGS: CTA HEAD FINDINGS  Subtle hypodensity in the left lower basal ganglia probably represents chronic ischemia. Questioned infarct in the extreme capsule on the left is less apparent on today's study. There is a chronic infarct in the right inferior cerebellum unchanged.  Ventricle size is  normal. Negative for hemorrhage. No definite acute infarct by CT.  Distal right vertebral artery is occluded. Distal left vertebral artery occluded. Left vertebral artery ends in most of branch which supplies left PICA. The basilar is extremely small with very little flow but is probably patent. Right PICA is a small vessel supplied from the basilar and left PICA. Posterior communicating arteries are patent and contribute to the posterior circulation. Diffuse atherosclerotic disease in the posterior cerebral artery bilaterally right greater than left with possible occlusion of the right PCA.  Extensive atherosclerotic calcification in the cavernous carotid bilaterally with mild to moderate stenosis bilaterally. Anterior and middle cerebral arteries are patent bilaterally. No filling defect or occlusion.  Negative for cerebral aneurysm.  Review of the MIP images confirms the above findings.  CTA NECK FINDINGS  Left common carotid artery origin from the innominate artery. Proximal great vessels are patent.  Right carotid: Diffuse atherosclerotic disease in the common carotid artery with mild stenosis. Calcified  and noncalcified plaque in the carotid bifurcation. 20% diameter stenosis right internal carotid artery. 90% stenosis proximal right external carotid artery.  Left carotid: Mild atherosclerotic disease in the common carotid artery. Calcified and predominant noncalcified plaque in the carotid bulb. 60% diameter stenosis of the left internal carotid artery at the bulb. Left external carotid artery widely patent.  Right vertebral artery occludes at approximately C4. No significant reconstitution of the right vertebral artery until PICA.  Left vertebral artery origin is patent. Left vertebral artery is mildly diseased and appears to terminate in a muscular branch which than supplies left PICA.  Review of the MIP images confirms the above findings.  IMPRESSION: No definite acute infarct by CT. MRI may be helpful to  evaluate for acute infarction. Chronic small infarct right PICA territory.  Severe posterior circulation disease. Occlusion of both distal vertebral arteries with some reconstitution of a very small left PICA via muscular collaterals from the left vertebral artery. Severe disease in the basilar which has high-grade stenosis and minimal flow. Posterior circulation is supplied via muscular collaterals from left vertebral artery as well as the posterior communicating arteries. There is diffuse disease in the posterior cerebral arteries bilaterally.  Atherosclerotic disease in the cavernous carotid bilaterally with mild to moderate stenosis. Anterior and middle cerebral arteries are patent bilaterally.  The right vertebral artery is occluded at C4 and does not reconstitute  20% diameter stenosis right internal carotid artery and 90% stenosis proximal right external carotid artery.  60% diameter stenosis left internal carotid artery due to soft plaque.   Electronically Signed   By: Marlan Palau M.D.   On: 10/16/2013 19:21    Scheduled Meds: . ALPRAZolam  1 mg Oral TID  . DULoxetine  60 mg Oral BID  . gabapentin  800 mg Oral TID  . insulin aspart  0-9 Units Subcutaneous TID WC  . nicotine  21 mg Transdermal Daily  . research study medication  100 mg Oral Daily  . research study medication  90 mg Oral BID  . simvastatin  40 mg Oral q1800   Continuous Infusions:   Principal Problem:   CVA (cerebral infarction) Active Problems:   Hypertension   Diabetes mellitus    Time spent: 30 minutes.    [facility]  [facility] Pager [phone] If 7PM-7AM, please contact night-coverage at [email or web address], password [email or web address] 10/17/2013, 8:36 AM  LOS: 2 days

## 2013-10-17 NOTE — Discharge Summary (Signed)
Physician Discharge Summary  Lotus Gover GNF:[id number] DOB: 22-May-1955 DOA: 10/15/2013  PCP: No PCP Per Patient  Admit date: 10/15/2013 Discharge date: 10/17/2013  Time spent: 30 minutes  Recommendations for Outpatient Follow-up:  1. Follow up with neurology as recommended.  2. followup with Dr Roda Shutters as per Socrates Trial in one week.   Discharge Diagnoses:  Principal Problem:   CVA (cerebral infarction) Active Problems:   Hypertension   Diabetes mellitus   Discharge Condition: improved.   Diet recommendation: low sodium diet.   Filed Weights   10/15/13 1423  Weight: 91.2 kg (201 lb 1 oz)    History of present illness:  [name] is a 57 y.o. male with PMH of CAD s/p CABG, HTN, DM, h/o CVA presented with L sided weakness, and slurred speech. He was admitted for evaluation of CVA. Neurology on board. Carotid duplex completed showed Right: 40-59% internal carotid artery stenosis. Left: 1-39% ICA stenosis. Bilateral: Vertebral artery flow is antegrade. Echocardiogram done and pending. CT angio of the head and neck did not reveal acute CVA, recommended MRI of the brain.    Hospital Course:  Suspected CVA:  CT angio of the head and neck did not reveal acute CVA,  MRI of the brain showed Acute infarction affecting the right side of the pons. Small acute infarctions elsewhere in both cerebral hemispheres . Echocardiogram showed LVH, and LVEF of 50 to 55%, without regional wall abnormalities. Carotid duplex showed right side, 40 to 59% ICA stenosis and left 1 to 39 % stenosis. Neurology consulted. Currently on Socrates trial . LDL 151. Started him on statin. hgba1c is 6.2%. Discharged on socrates trial meds. Outpatient follow up with neurology in one week.  Hypertension:  Controlled.  Mild renal insufficiency:  Resolved with hydration.  Continue to monitor.    Procedures:  Echocardiogram  Mri brain  Carotid duplex.   Consultations:  neurology  Discharge  Exam: Filed Vitals:   10/17/13 1003  BP: 123/79  Pulse: 75  Temp: 98.3 F (36.8 C)  Resp: 18    General: alert afebrile comfortable Cardiovascular: s1s2 Respiratory: ctab  Discharge Instructions You were cared for by a hospitalist during your hospital stay. If you have any questions about your discharge medications or the care you received while you were in the hospital after you are discharged, you can call the unit and asked to speak with the hospitalist on call if the hospitalist that took care of you is not available. Once you are discharged, your primary care physician will handle any further medical issues. Please note that NO REFILLS for any discharge medications will be authorized once you are discharged, as it is imperative that you return to your primary care physician (or establish a relationship with a primary care physician if you do not have one) for your aftercare needs so that they can reassess your need for medications and monitor your lab values.      Discharge Instructions   Discharge instructions    Complete by:  As directed   Follow up with PCP IN ONE WEEK FOLLOW UP WITH DR Roda Shutters AS RECOMMENDED.            Medication List         ALPRAZolam 1 MG tablet  Commonly known as:  XANAX  Take 1 mg by mouth 3 (three) times daily.     DULoxetine 60 MG capsule  Commonly known as:  CYMBALTA  Take 60 mg by mouth 2 (two) times  daily.     gabapentin 400 MG capsule  Commonly known as:  NEURONTIN  Take 800 mg by mouth 3 (three) times daily.     metFORMIN 500 MG tablet  Commonly known as:  GLUCOPHAGE  Take 1 tablet (500 mg total) by mouth daily with breakfast.  Start taking on:  10/19/2013     research study medication  Take 100 mg by mouth daily.     research study medication  Take 90 mg by mouth 2 (two) times daily.     simvastatin 40 MG tablet  Commonly known as:  ZOCOR  Take 1 tablet (40 mg total) by mouth daily at 6 PM.     traZODone 100 MG tablet   Commonly known as:  DESYREL  Take 200 mg by mouth at bedtime as needed for sleep.       No Known Allergies Follow-up Information   Follow up with No PCP Per Patient.   Specialty:  General Practice      Follow up with Xu,Jindong, MD. Schedule an appointment as soon as possible for a visit in 1 week.   Specialty:  Neurology   Contact information:   [address] [place] [address] [phone]        The results of significant diagnostics from this hospitalization (including imaging, microbiology, ancillary and laboratory) are listed below for reference.    Significant Diagnostic Studies: Ct Angio Head W/cm &/or Wo Cm  10/16/2013   CLINICAL DATA:  Slurred speech and left-sided weakness. Hypertension and diabetes.  EXAM: CT ANGIOGRAPHY HEAD AND NECK  TECHNIQUE: Multidetector CT imaging of the head and neck was performed using the standard protocol during bolus administration of intravenous contrast. Multiplanar CT image reconstructions and MIPs were obtained to evaluate the vascular anatomy. Carotid stenosis measurements (when applicable) are obtained utilizing NASCET criteria, using the distal internal carotid diameter as the denominator.  CONTRAST:  50mL OMNIPAQUE IOHEXOL 300 MG/ML  SOLN  COMPARISON:  CT head 10/15/2013  FINDINGS: CTA HEAD FINDINGS  Subtle hypodensity in the left lower basal ganglia probably represents chronic ischemia. Questioned infarct in the extreme capsule on the left is less apparent on today's study. There is a chronic infarct in the right inferior cerebellum unchanged.  Ventricle size is normal. Negative for hemorrhage. No definite acute infarct by CT.  Distal right vertebral artery is occluded. Distal left vertebral artery occluded. Left vertebral artery ends in most of branch which supplies left PICA. The basilar is extremely small with very little flow but is probably patent. Right PICA is a small vessel supplied from the basilar and left PICA.  Posterior communicating arteries are patent and contribute to the posterior circulation. Diffuse atherosclerotic disease in the posterior cerebral artery bilaterally right greater than left with possible occlusion of the right PCA.  Extensive atherosclerotic calcification in the cavernous carotid bilaterally with mild to moderate stenosis bilaterally. Anterior and middle cerebral arteries are patent bilaterally. No filling defect or occlusion.  Negative for cerebral aneurysm.  Review of the MIP images confirms the above findings.  CTA NECK FINDINGS  Left common carotid artery origin from the innominate artery. Proximal great vessels are patent.  Right carotid: Diffuse atherosclerotic disease in the common carotid artery with mild stenosis. Calcified and noncalcified plaque in the carotid bifurcation. 20% diameter stenosis right internal carotid artery. 90% stenosis proximal right external carotid artery.  Left carotid: Mild atherosclerotic disease in the common carotid artery. Calcified and predominant noncalcified plaque in the carotid bulb.  60% diameter stenosis of the left internal carotid artery at the bulb. Left external carotid artery widely patent.  Right vertebral artery occludes at approximately C4. No significant reconstitution of the right vertebral artery until PICA.  Left vertebral artery origin is patent. Left vertebral artery is mildly diseased and appears to terminate in a muscular branch which than supplies left PICA.  Review of the MIP images confirms the above findings.  IMPRESSION: No definite acute infarct by CT. MRI may be helpful to evaluate for acute infarction. Chronic small infarct right PICA territory.  Severe posterior circulation disease. Occlusion of both distal vertebral arteries with some reconstitution of a very small left PICA via muscular collaterals from the left vertebral artery. Severe disease in the basilar which has high-grade stenosis and minimal flow. Posterior circulation  is supplied via muscular collaterals from left vertebral artery as well as the posterior communicating arteries. There is diffuse disease in the posterior cerebral arteries bilaterally.  Atherosclerotic disease in the cavernous carotid bilaterally with mild to moderate stenosis. Anterior and middle cerebral arteries are patent bilaterally.  The right vertebral artery is occluded at C4 and does not reconstitute  20% diameter stenosis right internal carotid artery and 90% stenosis proximal right external carotid artery.  60% diameter stenosis left internal carotid artery due to soft plaque.   Electronically Signed   By: Marlan Palau M.D.   On: 10/16/2013 19:21   Dg Chest 2 View  10/15/2013   CLINICAL DATA:  Stroke.  EXAM: CHEST  2 VIEW  COMPARISON:  None.  FINDINGS: Previous median sternotomy and CABG procedure. The heart size and mediastinal contours are within normal limits. Both lungs are clear. The visualized skeletal structures are unremarkable.  IMPRESSION: No active cardiopulmonary disease.   Electronically Signed   By: Signa Kell M.D.   On: 10/15/2013 21:48   Ct Head Wo Contrast  10/17/2013   ADDENDUM REPORT: 10/15/2013 14:31  ADDENDUM: Critical Value/emergent results were called by telephone at the time of interpretation on 10/15/2013 at 2:24 pm to Dr. [name] neurology, who verbally acknowledged these results.   Electronically Signed   By: Bretta Bang M.D.   On: 10/15/2013 14:31   10/15/2013   CLINICAL DATA:  Slurred speech and left-sided weakness  EXAM: CT HEAD WITHOUT CONTRAST  TECHNIQUE: Contiguous axial images were obtained from the base of the skull through the vertex without intravenous contrast.  COMPARISON:  None.  FINDINGS: The ventricles are normal in size and configuration. There is no mass, hemorrhage, extra-axial fluid collection, or midline shift.  There is decreased attenuation in the region of the mid left extreme capsule and insular cortex, concerning for acute infarct in  this area. Elsewhere gray-white compartments appear normal. Middle cerebral arteries appear symmetric with respect to attenuation bilaterally. Bony calvarium appears intact. The mastoid air cells are clear. There is mild mucosal thickening in the inferior right maxillary antrum.  IMPRESSION: Decreased attenuation in the region of the left extreme capsule and insular cortex concerning for acute infarct in this area. No hemorrhage or mass effect.  Electronically Signed: By: Bretta Bang M.D. On: 10/15/2013 14:06   Ct Angio Neck W/cm &/or Wo/cm  10/16/2013   CLINICAL DATA:  Slurred speech and left-sided weakness. Hypertension and diabetes.  EXAM: CT ANGIOGRAPHY HEAD AND NECK  TECHNIQUE: Multidetector CT imaging of the head and neck was performed using the standard protocol during bolus administration of intravenous contrast. Multiplanar CT image reconstructions and MIPs were obtained to evaluate the vascular  anatomy. Carotid stenosis measurements (when applicable) are obtained utilizing NASCET criteria, using the distal internal carotid diameter as the denominator.  CONTRAST:  50mL OMNIPAQUE IOHEXOL 300 MG/ML  SOLN  COMPARISON:  CT head 10/15/2013  FINDINGS: CTA HEAD FINDINGS  Subtle hypodensity in the left lower basal ganglia probably represents chronic ischemia. Questioned infarct in the extreme capsule on the left is less apparent on today's study. There is a chronic infarct in the right inferior cerebellum unchanged.  Ventricle size is normal. Negative for hemorrhage. No definite acute infarct by CT.  Distal right vertebral artery is occluded. Distal left vertebral artery occluded. Left vertebral artery ends in most of branch which supplies left PICA. The basilar is extremely small with very little flow but is probably patent. Right PICA is a small vessel supplied from the basilar and left PICA. Posterior communicating arteries are patent and contribute to the posterior circulation. Diffuse atherosclerotic  disease in the posterior cerebral artery bilaterally right greater than left with possible occlusion of the right PCA.  Extensive atherosclerotic calcification in the cavernous carotid bilaterally with mild to moderate stenosis bilaterally. Anterior and middle cerebral arteries are patent bilaterally. No filling defect or occlusion.  Negative for cerebral aneurysm.  Review of the MIP images confirms the above findings.  CTA NECK FINDINGS  Left common carotid artery origin from the innominate artery. Proximal great vessels are patent.  Right carotid: Diffuse atherosclerotic disease in the common carotid artery with mild stenosis. Calcified and noncalcified plaque in the carotid bifurcation. 20% diameter stenosis right internal carotid artery. 90% stenosis proximal right external carotid artery.  Left carotid: Mild atherosclerotic disease in the common carotid artery. Calcified and predominant noncalcified plaque in the carotid bulb. 60% diameter stenosis of the left internal carotid artery at the bulb. Left external carotid artery widely patent.  Right vertebral artery occludes at approximately C4. No significant reconstitution of the right vertebral artery until PICA.  Left vertebral artery origin is patent. Left vertebral artery is mildly diseased and appears to terminate in a muscular branch which than supplies left PICA.  Review of the MIP images confirms the above findings.  IMPRESSION: No definite acute infarct by CT. MRI may be helpful to evaluate for acute infarction. Chronic small infarct right PICA territory.  Severe posterior circulation disease. Occlusion of both distal vertebral arteries with some reconstitution of a very small left PICA via muscular collaterals from the left vertebral artery. Severe disease in the basilar which has high-grade stenosis and minimal flow. Posterior circulation is supplied via muscular collaterals from left vertebral artery as well as the posterior communicating arteries.  There is diffuse disease in the posterior cerebral arteries bilaterally.  Atherosclerotic disease in the cavernous carotid bilaterally with mild to moderate stenosis. Anterior and middle cerebral arteries are patent bilaterally.  The right vertebral artery is occluded at C4 and does not reconstitute  20% diameter stenosis right internal carotid artery and 90% stenosis proximal right external carotid artery.  60% diameter stenosis left internal carotid artery due to soft plaque.   Electronically Signed   By: Marlan Palau M.D.   On: 10/16/2013 19:21   Mr Brain Wo Contrast  10/17/2013   CLINICAL DATA:  Stroke.  Slurred speech.  Left-sided weakness.  EXAM: MRI HEAD WITHOUT CONTRAST  TECHNIQUE: Multiplanar, multiecho pulse sequences of the brain and surrounding structures were obtained without intravenous contrast.  COMPARISON:  10/15/2013.  10/16/2013.  FINDINGS: Diffusion imaging shows a 1-1.5 cm acute infarction within the right side of  the pons. There are a few scattered punctate acute infarctions within the cerebellum bilaterally. There are some old or cerebellar infarctions bilaterally. No evidence of mass effect or hemorrhage.  The cerebral hemispheres are normal without evidence of old or acute infarction, mass lesion, hemorrhage, hydrocephalus or extra-axial collection.  Abnormal flows demonstrated in the vertebral and basilar arteries, as shown by a CT angiography.  No pituitary mass. No inflammatory sinus disease. No skull or skullbase lesion.  IMPRESSION: Acute infarction affecting the right side of the pons. Small acute infarctions elsewhere in both cerebral hemispheres. Old bilateral cerebellar infarctions present as well. Abnormal appearance of the flow in the vertebral and basilar arteries, better shown by CT angiography.   Electronically Signed   By: Paulina Fusi M.D.   On: 10/17/2013 09:39    Microbiology: No results found for this or any previous visit (from the past 240 hour(s)).    Labs: Basic Metabolic Panel:  Recent Labs Lab 10/15/13 1349 10/15/13 1355 10/17/13 0350  NA 134* 133* 139  K 4.5 3.9 4.4  CL 95* 98 101  CO2 26  --  29  GLUCOSE 104* 109* 97  BUN CREATININE 1.22 1.40* 1.15  CALCIUM 9.2  --  9.0   Liver Function Tests:  Recent Labs Lab 10/15/13 1349  AST 10  ALT 7  ALKPHOS 62  BILITOT 0.2*  PROT 6.7  ALBUMIN 3.5   No results found for this basename: LIPASE, AMYLASE,  in the last 168 hours No results found for this basename: AMMONIA,  in the last 168 hours CBC:  Recent Labs Lab 10/15/13 1349 10/15/13 1355 10/17/13 0350  WBC 5.1  --  4.1  NEUTROABS 2.6  --   --   HGB 14.2 14.6 12.6*  HCT 40.7 43.0 37.9*  MCV 87.7  --  88.3  PLT 196  --  178   Cardiac Enzymes: No results found for this basename: CKTOTAL, CKMB, CKMBINDEX, TROPONINI,  in the last 168 hours BNP: BNP (last 3 results) No results found for this basename: PROBNP,  in the last 8760 hours CBG:  Recent Labs Lab 10/16/13 0636 10/16/13 1150 10/16/13 1646 10/16/13 2159 10/17/13 0643  GLUCAP 103* 101* 86 104* 116*       Signed:  Eliz Nigg  [facility] 10/17/2013, 12:05 PM

## 2013-10-17 NOTE — Clinical Social Work Note (Signed)
CSW consulted for possible SNF placement at time of discharge. Per chart review, pt to be discharged home with home health services. CSW signing off. Thank you for the referral.  Adamarie Izzo, MSW, LCSWA Licensed Clinical Social Worker [id number] and [id number] [phone] 

## 2013-10-17 NOTE — Care Management Note (Addendum)
  Page 1 of 1   10/17/2013     2:44:14 PM CARE MANAGEMENT NOTE 10/17/2013  Patient:  [name], [name]   Account Number:  [id number]  Date Initiated:  10/17/2013  Documentation initiated by:  Lorne Skeens  Subjective/Objective Assessment:   Patient was admitted with CVA. Lives at home alone.     Action/Plan:   Will follow for discharge needs pending PT/OT evals and physician orders.   Anticipated DC Date:  10/17/2013   Anticipated DC Plan:  [place]  CM consult      Choice offered to / List presented to:          [facility] arranged  [place].   Status of service:   Medicare Important Message given?  NA - LOS <3 / Initial given by admissions (If response is "NO", the following Medicare IM given date fields will be blank) Date Medicare IM given:   Medicare IM given by:   Date Additional Medicare IM given:   Additional Medicare IM given by:    Discharge Disposition:  [place]  Per UR Regulation:    If discussed at Long Length of Stay Meetings, dates discussed:    Comments:  10/17/13 [place] RN, MSN, CM- Met with patient to discuss home health. Patient is interested and has chosen [facility]. Mary with [facility] ([place]) was notified and has accepted the referral for discharge home today. Patient is discharging to  [place], [place]  phone [phone]

## 2013-10-18 LAB — GLUCOSE, CAPILLARY: Glucose-Capillary: 131 mg/dL — ABNORMAL HIGH (ref 70–99)

## 2013-11-10 ENCOUNTER — Telehealth: Payer: Self-pay | Admitting: *Deleted

## 2013-11-10 NOTE — Telephone Encounter (Signed)
JUST FYI.  Pt has not responded to calls to make final discharge visit with PT/OT.  Has seen pt for treatment, but have not received call back from pt after numerous calls.

## 2014-01-16 ENCOUNTER — Encounter: Payer: Self-pay | Admitting: Neurology

## 2014-01-17 ENCOUNTER — Telehealth: Payer: Self-pay | Admitting: Neurology

## 2014-01-17 NOTE — Telephone Encounter (Signed)
I left a message for the patient to return my call.

## 2014-01-18 ENCOUNTER — Encounter: Payer: Self-pay | Admitting: Neurology

## 2014-01-18 ENCOUNTER — Telehealth: Payer: Self-pay | Admitting: Neurology

## 2014-01-18 NOTE — Telephone Encounter (Signed)
I left a message for the patient to return my call.

## 2014-01-23 ENCOUNTER — Encounter: Payer: Self-pay | Admitting: Neurology

## 2014-01-26 ENCOUNTER — Encounter: Payer: Self-pay | Admitting: Neurology

## 2014-01-30 ENCOUNTER — Telehealth: Payer: Self-pay

## 2014-02-09 ENCOUNTER — Telehealth: Payer: Self-pay

## 2015-05-22 ENCOUNTER — Encounter (HOSPITAL_COMMUNITY): Payer: Self-pay

## 2015-05-22 ENCOUNTER — Emergency Department (HOSPITAL_COMMUNITY)
Admission: EM | Admit: 2015-05-22 | Discharge: 2015-05-23 | Disposition: A | Discharge status: Other Acute Inpatient Hospital | Payer: Medicare Other | Attending: Emergency Medicine | Admitting: Emergency Medicine

## 2015-05-22 DIAGNOSIS — F1210 Cannabis abuse, uncomplicated: Secondary | ICD-10-CM | POA: Diagnosis not present

## 2015-05-22 DIAGNOSIS — Z79899 Other long term (current) drug therapy: Secondary | ICD-10-CM | POA: Insufficient documentation

## 2015-05-22 DIAGNOSIS — I10 Essential (primary) hypertension: Secondary | ICD-10-CM | POA: Insufficient documentation

## 2015-05-22 DIAGNOSIS — M549 Dorsalgia, unspecified: Secondary | ICD-10-CM | POA: Insufficient documentation

## 2015-05-22 DIAGNOSIS — F32A Depression, unspecified: Secondary | ICD-10-CM

## 2015-05-22 DIAGNOSIS — E1165 Type 2 diabetes mellitus with hyperglycemia: Secondary | ICD-10-CM | POA: Diagnosis not present

## 2015-05-22 DIAGNOSIS — F329 Major depressive disorder, single episode, unspecified: Secondary | ICD-10-CM | POA: Diagnosis present

## 2015-05-22 DIAGNOSIS — F17210 Nicotine dependence, cigarettes, uncomplicated: Secondary | ICD-10-CM | POA: Insufficient documentation

## 2015-05-22 DIAGNOSIS — K439 Ventral hernia without obstruction or gangrene: Secondary | ICD-10-CM | POA: Insufficient documentation

## 2015-05-22 DIAGNOSIS — R45851 Suicidal ideations: Secondary | ICD-10-CM | POA: Insufficient documentation

## 2015-05-22 DIAGNOSIS — F1910 Other psychoactive substance abuse, uncomplicated: Secondary | ICD-10-CM

## 2015-05-22 DIAGNOSIS — R739 Hyperglycemia, unspecified: Secondary | ICD-10-CM

## 2015-05-22 DIAGNOSIS — Z8673 Personal history of transient ischemic attack (TIA), and cerebral infarction without residual deficits: Secondary | ICD-10-CM | POA: Insufficient documentation

## 2015-05-22 HISTORY — DX: Major depressive disorder, single episode, unspecified: F32.9

## 2015-05-22 HISTORY — DX: Depression, unspecified: F32.A

## 2015-05-22 LAB — ETHANOL: Alcohol, Ethyl (B): <5 mg/dL (ref <5)

## 2015-05-22 LAB — RAPID URINE DRUG SCREEN, HOSP PERFORMED
Amphetamines: NOT DETECTED
Barbiturates: NOT DETECTED
Benzodiazepines: NOT DETECTED
Cocaine: NOT DETECTED
Opiates: NOT DETECTED
Tetrahydrocannabinol: POSITIVE — AB

## 2015-05-22 LAB — COMPREHENSIVE METABOLIC PANEL
ALT: 16 U/L — ABNORMAL LOW (ref 17–63)
AST: 15 U/L (ref 15–41)
Albumin: 4 g/dL (ref 3.5–5.0)
Alkaline Phosphatase: 73 U/L (ref 38–126)
Anion gap: 10 (ref 5–15)
BUN: 11 mg/dL (ref 6–20)
CO2: 26 mmol/L (ref 22–32)
Calcium: 9.3 mg/dL (ref 8.9–10.3)
Chloride: 99 mmol/L — ABNORMAL LOW (ref 101–111)
Creatinine, Ser: 1.26 mg/dL — ABNORMAL HIGH (ref 0.61–1.24)
GFR calc Af Amer: >60 mL/min (ref >60)
Glucose, Bld: 267 mg/dL — ABNORMAL HIGH (ref 65–99)
Potassium: 4.2 mmol/L (ref 3.5–5.1)
Sodium: 135 mmol/L (ref 135–145)
Total Bilirubin: 0.5 mg/dL (ref 0.3–1.2)
Total Protein: 7.5 g/dL (ref 6.5–8.1)

## 2015-05-22 LAB — CBC
HCT: 43.4 % (ref 39.0–52.0)
HEMOGLOBIN: 15 g/dL (ref 13.0–17.0)
MCH: 30.5 pg (ref 26.0–34.0)
MCHC: 34.6 g/dL (ref 30.0–36.0)
MCV: 88.2 fL (ref 78.0–100.0)
Platelets: 222 10*3/uL (ref 150–400)
RBC: 4.92 MIL/uL (ref 4.22–5.81)
RDW: 14 % (ref 11.5–15.5)
WBC: 8.4 10*3/uL (ref 4.0–10.5)

## 2015-05-22 LAB — CBG MONITORING, ED: Glucose-Capillary: 288 mg/dL — ABNORMAL HIGH (ref 65–99)

## 2015-05-22 MED ORDER — LORAZEPAM 1 MG PO TABS: 1.0000 mg | ORAL_TABLET | Freq: Three times a day (TID) | ORAL | Status: DC | PRN

## 2015-05-22 MED ORDER — ONDANSETRON HCL 4 MG PO TABS: 4.0000 mg | ORAL_TABLET | Freq: Three times a day (TID) | ORAL | Status: DC | PRN

## 2015-05-22 MED ORDER — ACETAMINOPHEN 325 MG PO TABS: 650.0000 mg | ORAL_TABLET | ORAL | Status: DC | PRN

## 2015-05-22 MED ORDER — IBUPROFEN 200 MG PO TABS: 600.0000 mg | ORAL_TABLET | Freq: Three times a day (TID) | ORAL | Status: DC | PRN

## 2015-05-22 MED ORDER — TRAZODONE HCL 100 MG PO TABS
200.0000 mg | ORAL_TABLET | Freq: Every evening | ORAL | Status: DC | PRN
Start: 2015-05-22 — End: 2015-05-23

## 2015-05-22 MED ORDER — METFORMIN HCL 500 MG PO TABS
500.0000 mg | ORAL_TABLET | Freq: Two times a day (BID) | ORAL | Status: DC
  Administered 2015-05-23: 500 mg via ORAL
  Filled 2015-05-22 (×3): qty 1

## 2015-05-22 MED ORDER — DULOXETINE HCL 60 MG PO CPEP
60.0000 mg | ORAL_CAPSULE | Freq: Two times a day (BID) | ORAL | Status: DC
  Administered 2015-05-22 – 2015-05-23 (×2): 60 mg via ORAL
  Filled 2015-05-22 (×3): qty 1

## 2015-05-22 MED ORDER — GABAPENTIN 300 MG PO CAPS
600.0000 mg | ORAL_CAPSULE | Freq: Three times a day (TID) | ORAL | Status: DC
  Administered 2015-05-22 – 2015-05-23 (×2): 600 mg via ORAL
  Filled 2015-05-22 (×2): qty 2

## 2015-05-22 MED ORDER — GABAPENTIN 600 MG PO TABS: 600.0000 mg | ORAL_TABLET | Freq: Four times a day (QID) | ORAL | Status: DC

## 2015-05-22 MED ORDER — ALUM & MAG HYDROXIDE-SIMETH 200-200-20 MG/5ML PO SUSP: 30.0000 mL | ORAL | Status: DC | PRN

## 2015-05-22 NOTE — ED Notes (Signed)
Pt here for mental, physical and drug problem.  States wants mental for depression.  Detox from heroin and cocaine. And physical for chronic generalized pain
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
## 2015-05-22 NOTE — BH Assessment (Addendum)
Assessment Note  [name] is an 60 y.o. male presenting to [facility] voluntarily for suicidal ideations with a plan to "overdose on whatever i can get my hands on that will kill me." Patient reports that his most recent stressor is pain and endorses symptoms of depression as; fatigue, feeling worthless, hoplessness, insomnia (5-6 hours per night), isolation, and loss of interest in usual pleasures, and varying appetite. Patient states that he has had "several" suicide attempts in the past and the last was "sometime last year" when he attempted to "overdose on drugs" and was treated in [place]. Patient denies HI and history of being violent towards others. Patient denies AVH and does not appear to be responding to internal stimuli.   Patient is alert and oriented x4 and is assessed in the SAPPU. Patient sits upright in the bed and makes good eye contact. Patient has a strong body odor and is dressed in scrubs. Patient states that he has had a [facility] for "fve or six years" and states that he last spoke with them today which is why he came into the ED.  Patient states that he does not use alcohol but states that he will use "pot and anything else that will stop the pain up to heroin." Patient states that he has used THC since the age of 38 and uses it "wheverer I can afford it, like seven grams a week." Patient states that he last use "one or two joints this afternoon." Patient states that he started using opiates one year ago and usually uses a "$20 bag" and last used two weeks ago.  Patient UDS + THC and BAL <5 at time of assessment.  Patient cannot contract for safety and reports "I don't even know why I'm here i should have just went ahead and killed myself, but I called my ACT Team."   Consulted with Donell Sievert, PA-C who recommends inpatient treatment (300).  Diagnosis: Major Depressive Disorder, Recurrent, Severe  Past Medical History:  Past Medical History  Diagnosis Date  . Stroke  (HCC)   . Diabetes mellitus without complication (HCC)   . Hypertension   . Depression     History reviewed. No pertinent past surgical history.  Family History: History reviewed. No pertinent family history.  Social History:  reports that he has been smoking Cigarettes.  He has been smoking about 1.00 pack per day. He does not have any smokeless tobacco history on file. He reports that he uses illicit drugs (Marijuana and Cocaine). He reports that he does not drink alcohol.  Additional Social History:  Alcohol / Drug Use Pain Medications: See PTA Prescriptions: See PTA Over the Counter: See PTA History of alcohol / drug use?: Yes Substance #1 Name of Substance 1: THC 1 - Age of First Use: 17 1 - Amount (size/oz): 7 grams/week 1 - Frequency: "whenever i can afford it" 1 - Duration: ongoing 1 - Last Use / Amount: "2 joints" this afternoon Substance #2 Name of Substance 2: opiates 2 - Age of First Use: 58 2 - Amount (size/oz): $20 bag (heroin) 2 - Frequency: "very selfom" 2 - Duration: ongoing 2 - Last Use / Amount: 2 weeks ago  CIWA: CIWA-Ar BP: 134/88 mmHg Pulse Rate: 102 COWS: Clinical Opiate Withdrawal Scale (COWS) Resting Pulse Rate: Pulse Rate 80 or below Sweating: No report of chills or flushing Restlessness: Able to sit still Pupil Size: Pupils pinned or normal size for room light Bone or Joint Aches: Not present  Runny Nose or Tearing: Not present GI Upset: No GI symptoms Tremor: No tremor Yawning: No yawning Anxiety or Irritability: None Gooseflesh Skin: Skin is smooth COWS Total Score: 0  Allergies: No Known Allergies  Home Medications:  (Not in a hospital admission)  OB/GYN Status:  No LMP for male patient.  General Assessment Data [facility] of Assessment: [facility] TTS Assessment: In system Is this a Tele or Face-to-Face Assessment?: Face-to-Face Is this an Initial Assessment or a Re-assessment for this encounter?: Initial Assessment Marital status:  Divorced Is patient pregnant?: No Pregnancy Status: No Living Arrangements: Alone Can pt return to current living arrangement?: Yes Admission Status: Voluntary Is patient capable of signing voluntary admission?: Yes Referral Source: Self/Family/Friend     Crisis Care Plan Living Arrangements: Alone Name of Psychiatrist: [facility] Name of Therapist: [facility]  Education Status Is patient currently in school?: No Highest grade of school patient has completed: 12  Risk to self with the past 6 months Suicidal Ideation: Yes-Currently Present Has patient been a risk to self within the past 6 months prior to admission? : No Suicidal Intent: Yes-Currently Present Has patient had any suicidal intent within the past 6 months prior to admission? : No Is patient at risk for suicide?: Yes Suicidal Plan?: Yes-Currently Present Has patient had any suicidal plan within the past 6 months prior to admission? : No Specify Current Suicidal Plan: "overdose on whatever i can that will kill me" Access to Means: Yes Specify Access to Suicidal Means: drugs What has been your use of drugs/alcohol within the last 12 months?: 'whatever will help the pain" heroin, THC Previous Attempts/Gestures: Yes How many times?:  ("several") Other Self Harm Risks: Denies Triggers for Past Attempts: Other (Comment) (pain) Intentional Self Injurious Behavior: None Family Suicide History: Yes (father) Recent stressful life event(s): Other (Comment) (pain "everthing") Persecutory voices/beliefs?: No Depression: Yes Depression Symptoms: Insomnia, Isolating, Fatigue, Loss of interest in usual pleasures, Feeling worthless/self pity Substance abuse history and/or treatment for substance abuse?: Yes Suicide prevention information given to non-admitted patients: Not applicable  Risk to Others within the past 6 months Homicidal Ideation: No Does patient have any lifetime risk of violence toward others beyond the six months  prior to admission? : No Thoughts of Harm to Others: No Current Homicidal Intent: No Current Homicidal Plan: No Access to Homicidal Means: No Identified Victim: Denies History of harm to others?: No Assessment of Violence: None Noted Violent Behavior Description: Denies Does patient have access to weapons?: No Criminal Charges Pending?: No Does patient have a court date: No Is patient on probation?: No  Psychosis Hallucinations: None noted Delusions: None noted  Mental Status Report Appearance/Hygiene: Body odor, In scrubs Eye Contact: Good Motor Activity: Freedom of movement Speech: Logical/coherent, Loud, Slurred Level of Consciousness: Alert Mood: Anxious Affect: Anxious Anxiety Level: Moderate Thought Processes: Coherent, Relevant Judgement: Impaired Orientation: Person, Place, Time, Situation, Appropriate for developmental age Obsessive Compulsive Thoughts/Behaviors: None  Cognitive Functioning Concentration: Decreased Memory: Recent Intact, Remote Intact IQ: Average Insight: Fair Impulse Control: Fair Appetite: Fair Sleep: Decreased Total Hours of Sleep: 6 Vegetative Symptoms: None  ADLScreening [facility] Assessment Services) Patient's cognitive ability adequate to safely complete daily activities?: Yes Patient able to express need for assistance with ADLs?: Yes Independently performs ADLs?: No  Prior Inpatient Therapy Prior Inpatient Therapy: Yes Prior Therapy Dates: multiple Prior Therapy Facilty/Provider(s): multiple Reason for Treatment: Depression  Prior Outpatient Therapy Prior Outpatient Therapy: Yes Prior Therapy Dates: 2011- Present Prior Therapy Facilty/Provider(s): [facility]  Reason for Treatment: Depression/SA Does patient have an ACCT team?: Yes Does patient have Intensive In-House Services?  : No Does patient have [facility] services? : No Does patient have P4CC services?: No  ADL Screening (condition at time of admission) Patient's cognitive  ability adequate to safely complete daily activities?: Yes Is the patient deaf or have difficulty hearing?: No Does the patient have difficulty seeing, even when wearing glasses/contacts?: No Does the patient have difficulty concentrating, remembering, or making decisions?: No Patient able to express need for assistance with ADLs?: Yes Does the patient have difficulty dressing or bathing?: Yes Independently performs ADLs?: No Does the patient have difficulty walking or climbing stairs?: Yes     Therapy Consults (therapy consults require a physician order) PT Evaluation Needed: No OT Evalulation Needed: No SLP Evaluation Needed: No Abuse/Neglect Assessment (Assessment to be complete while patient is alone) Physical Abuse: Denies Verbal Abuse: Denies Sexual Abuse: Denies Exploitation of patient/patient's resources: Denies Self-Neglect: Denies Values / Beliefs Cultural Requests During Hospitalization: None Spiritual Requests During Hospitalization: None Consults Spiritual Care Consult Needed: No Social Work Consult Needed: No Merchant navy officer (For Healthcare) Does patient have an advance directive?: No Would patient like information on creating an advanced directive?: No - patient declined information    Additional Information 1:1 In Past 12 Months?: No CIRT Risk: No Elopement Risk: No Does patient have medical clearance?: No     Disposition:  Disposition Initial Assessment Completed for this Encounter: Yes  On Site Evaluation by:   Reviewed with Physician:    Bartolo Montanye 05/22/2015 8:49 PM

## 2015-05-22 NOTE — ED Notes (Signed)
[facility] with a plan to shoot himself or overdose on drugs. Patient denies HI and AVH at this time. Patient reports feelings of depression and hopelessness. Patient oriented to the unit. Plan of care discussed. Patient voices no complaints or concerns at this time. Encouragement and support provided and safety maintain. Q 15 min safety checks in place.

## 2015-05-22 NOTE — BH Assessment (Addendum)
Consulted with Donell SievertSpencer Simon, PA-C who recommends inpatient treatment (300). Informed EDP of disposition.   Davina PokeJoVea Kemisha Bonnette, LCSW Therapeutic Triage Specialist [facility] 05/22/2015 9:34 PM

## 2015-05-23 ENCOUNTER — Inpatient Hospital Stay (HOSPITAL_COMMUNITY)
Admission: EM | Admit: 2015-05-23 | Discharge: 2015-06-01 | DRG: 885 | Disposition: A | Discharge status: Home / Self Care | Payer: Medicare Other | Source: Intra-hospital | Attending: Psychiatry | Admitting: Psychiatry

## 2015-05-23 ENCOUNTER — Encounter (HOSPITAL_COMMUNITY): Payer: Self-pay

## 2015-05-23 DIAGNOSIS — F411 Generalized anxiety disorder: Secondary | ICD-10-CM | POA: Diagnosis not present

## 2015-05-23 DIAGNOSIS — F1310 Sedative, hypnotic or anxiolytic abuse, uncomplicated: Secondary | ICD-10-CM | POA: Diagnosis present

## 2015-05-23 DIAGNOSIS — F332 Major depressive disorder, recurrent severe without psychotic features: Secondary | ICD-10-CM | POA: Diagnosis not present

## 2015-05-23 DIAGNOSIS — G894 Chronic pain syndrome: Secondary | ICD-10-CM | POA: Diagnosis present

## 2015-05-23 DIAGNOSIS — F1220 Cannabis dependence, uncomplicated: Secondary | ICD-10-CM | POA: Diagnosis present

## 2015-05-23 DIAGNOSIS — F1121 Opioid dependence, in remission: Secondary | ICD-10-CM | POA: Diagnosis present

## 2015-05-23 DIAGNOSIS — R45851 Suicidal ideations: Secondary | ICD-10-CM

## 2015-05-23 DIAGNOSIS — F17210 Nicotine dependence, cigarettes, uncomplicated: Secondary | ICD-10-CM | POA: Diagnosis present

## 2015-05-23 DIAGNOSIS — F1190 Opioid use, unspecified, uncomplicated: Secondary | ICD-10-CM | POA: Diagnosis present

## 2015-05-23 DIAGNOSIS — F329 Major depressive disorder, single episode, unspecified: Secondary | ICD-10-CM | POA: Diagnosis present

## 2015-05-23 LAB — GLUCOSE, CAPILLARY
GLUCOSE-CAPILLARY: 231 mg/dL — AB (ref 65–99)
GLUCOSE-CAPILLARY: 220 mg/dL — AB (ref 65–99)

## 2015-05-23 MED ORDER — GABAPENTIN 400 MG PO CAPS
800.0000 mg | ORAL_CAPSULE | Freq: Four times a day (QID) | ORAL | Status: DC
  Administered 2015-05-23 – 2015-06-01 (×34): 800 mg via ORAL
  Filled 2015-05-23 (×46): qty 2

## 2015-05-23 MED ORDER — IBUPROFEN 600 MG PO TABS
600.0000 mg | ORAL_TABLET | Freq: Three times a day (TID) | ORAL | Status: DC | PRN
  Administered 2015-05-28: 600 mg via ORAL
  Filled 2015-05-23: qty 1

## 2015-05-23 MED ORDER — GABAPENTIN 300 MG PO CAPS
600.0000 mg | ORAL_CAPSULE | Freq: Three times a day (TID) | ORAL | Status: DC
  Administered 2015-05-23: 600 mg via ORAL
  Filled 2015-05-23 (×6): qty 2

## 2015-05-23 MED ORDER — PNEUMOCOCCAL VAC POLYVALENT 25 MCG/0.5ML IJ INJ: 0.5000 mL | INJECTION | INTRAMUSCULAR | Status: DC

## 2015-05-23 MED ORDER — TRAZODONE HCL 100 MG PO TABS
200.0000 mg | ORAL_TABLET | Freq: Every evening | ORAL | Status: DC | PRN
  Administered 2015-05-25: 200 mg via ORAL
  Filled 2015-05-23: qty 2

## 2015-05-23 MED ORDER — ACETAMINOPHEN 325 MG PO TABS
650.0000 mg | ORAL_TABLET | ORAL | Status: DC | PRN
  Administered 2015-05-25 – 2015-05-29 (×2): 650 mg via ORAL
  Filled 2015-05-23 (×2): qty 2

## 2015-05-23 MED ORDER — LORAZEPAM 1 MG PO TABS
1.0000 mg | ORAL_TABLET | Freq: Three times a day (TID) | ORAL | Status: DC | PRN
  Administered 2015-05-24 – 2015-05-31 (×8): 1 mg via ORAL
  Filled 2015-05-23 (×9): qty 1

## 2015-05-23 MED ORDER — ALUM & MAG HYDROXIDE-SIMETH 200-200-20 MG/5ML PO SUSP: 30.0000 mL | ORAL | Status: DC | PRN

## 2015-05-23 MED ORDER — DULOXETINE HCL 60 MG PO CPEP
60.0000 mg | ORAL_CAPSULE | Freq: Two times a day (BID) | ORAL | Status: DC
  Administered 2015-05-23 – 2015-05-25 (×4): 60 mg via ORAL
  Filled 2015-05-23 (×9): qty 1

## 2015-05-23 MED ORDER — ONDANSETRON HCL 4 MG PO TABS: 4.0000 mg | ORAL_TABLET | Freq: Three times a day (TID) | ORAL | Status: DC | PRN

## 2015-05-23 MED ORDER — METFORMIN HCL 500 MG PO TABS
500.0000 mg | ORAL_TABLET | Freq: Two times a day (BID) | ORAL | Status: DC
  Administered 2015-05-24 – 2015-06-01 (×16): 500 mg via ORAL
  Filled 2015-05-23 (×24): qty 1

## 2015-05-23 MED ORDER — ZOLPIDEM TARTRATE 10 MG PO TABS
10.0000 mg | ORAL_TABLET | Freq: Every evening | ORAL | Status: DC | PRN
  Administered 2015-05-24: 10 mg via ORAL
  Filled 2015-05-23: qty 1

## 2015-05-23 MED ORDER — NICOTINE 21 MG/24HR TD PT24
21.0000 mg | MEDICATED_PATCH | Freq: Every day | TRANSDERMAL | Status: DC
  Administered 2015-05-23 – 2015-06-01 (×10): 21 mg via TRANSDERMAL
  Filled 2015-05-23 (×13): qty 1

## 2015-05-23 NOTE — BH Assessment (Signed)
[facility] Assessment Progress Note  Per morning shift report, [facility] Donell SievertSpencer Simon, PA, and assigned to Rm 303-1 by Clint Bolderori Beck, RN, [facility] with a caveat that pt is not to be transferred until the morning.  At 09:40 Berneice Heinrichina Tate, RN, [facility] reports that they will be ready for the pt at 10:30.  Pt has signed Voluntary Admission and Consent for Treatment, as well as Consent to Release Information to [facility], his outpatient provider, and a notification call has been placed.  [facility].  Pt's nurse, Diane, has been notified, and agrees to send original paperwork along with pt via [facility], and to call report to [phone].  Doylene Canninghomas Teon Hudnall, MA Triage Specialist [phone]

## 2015-05-23 NOTE — Progress Notes (Signed)
Patient ID: [name], male   DOB: 05-21-1955, 60 y.o.   MRN: [id number] PER STATE REGULATIONS 482.30  THIS CHART WAS REVIEWED FOR MEDICAL NECESSITY WITH RESPECT TO THE PATIENT'S ADMISSION/DURATION OF STAY.  NEXT REVIEW DATE:05/27/15  Loura HaltBARBARA Jomel Whittlesey, RN, BSN CASE MANAGER
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
## 2015-05-23 NOTE — Tx Team (Signed)
Initial Interdisciplinary Treatment Plan   PATIENT STRESSORS: Financial difficulties Health problems Substance abuse   PATIENT STRENGTHS: Capable of independent living ACTT Team   PROBLEM LIST: Problem List/Patient Goals Date to be addressed Date deferred Reason deferred Estimated date of resolution  Depression 05/23/15     Anxiety 05/23/15     Suicidal ideation 05/23/15     Substance abuse 05/23/15     "I want to leave here and not want to kill myself" 05/23/15     "Get better" 05/23/15                        DISCHARGE CRITERIA:  Improved stabilization in mood, thinking, and/or behavior Motivation to continue treatment in a less acute level of care Verbal commitment to aftercare and medication compliance  PRELIMINARY DISCHARGE PLAN: Outpatient therapy Medication Management  PATIENT/FAMIILY INVOLVEMENT: This treatment plan has been presented to and reviewed with the patient, [name].  The patient and family have been given the opportunity to ask questions and make suggestions.  Norm ParcelHeather V Curby Carswell 05/23/2015, 12:47 PM

## 2015-05-23 NOTE — BH Assessment (Signed)
[facility] Clint Bolderori Beck, [facility].  Contact AM AC for time for admission tomorrow morning.  Call nurse report to [phone]. Patient will need to sign voluntary consent to treat and ROI. [facility] and report is called. Informed patients nurse of acceptance and stipulations.   Davina PokeJoVea Dayan Kreis, LCSW Therapeutic Triage Specialist [facility] 05/23/2015 3:35 AM

## 2015-05-23 NOTE — B[facility] Group Notes (Signed)
[facility] LCSW Group Therapy  05/23/2015 12:52 PM  Type of Therapy:  Group Therapy  Participation Level:  Did Not Attend-pt meeting with MD (new admission). Excused from group.   Modes of Intervention:  Confrontation, Discussion, Education, Exploration, Problem-solving, Rapport Building, Socialization and Support  Summary of Progress/Problems:  Self Sabotage and and Enabling Behaviors. Group members were asked to define these terms and identify instances of self sabotage in their experiences with substance abuse/relapse. Patients discussed how these examples of self sabotoge impacted their sobriety and how to avoid self sabotoge in the future. Group members were asked to identify a time when they were enabled or enabled someone else. Patients were encouraged to explore unhealthy relationships in their lives and ways to stop enabling behaviors.   Smart, Toma Erichsen  LCSW  05/23/2015, 12:52 PM

## 2015-05-23 NOTE — [facility] Suicide Risk Assessment (Signed)
[facility]   Nursing information obtained from:    Demographic factors:    Current Mental Status:    Loss Factors:    Historical Factors:    Risk Reduction Factors:     Total Time spent with patient: 45 minutes Principal Problem: MDD (major depressive disorder), recurrent episode, severe (HCC) Diagnosis:   Patient Active Problem List   Diagnosis Date Noted  . MDD (major depressive disorder), recurrent episode, severe (HCC) [F33.2] 05/23/2015  . Chronic pain syndrome [G89.4] 05/23/2015  . GAD (generalized anxiety disorder) [F41.1] 05/23/2015  . CVA (cerebral infarction) [I63.9] 10/15/2013  . Hypertension [I10] 10/15/2013  . Diabetes mellitus (HCC) [E11.9] 10/15/2013   Subjective Data: see admission H and p  Continued Clinical Symptoms:  Alcohol Use Disorder Identification Test Final Score (AUDIT): 0 The "Alcohol Use Disorders Identification Test", Guidelines for Use in Primary Care, Second Edition.  World Science writerHealth Organization [facility](WHO). Score between 0-7:  no or low risk or alcohol related problems. Score between 8-15:  moderate risk of alcohol related problems. Score between 16-19:  high risk of alcohol related problems. Score 20 or above:  warrants further diagnostic evaluation for alcohol dependence and treatment.   CLINICAL FACTORS:   Severe Anxiety and/or Agitation Depression:   Comorbid alcohol abuse/dependence Chronic Pain  Psychiatric Specialty Exam: ROS  Blood pressure 184/106, pulse 96, temperature 98.7 F (37.1 C), temperature source Oral, resp. rate 18, height 5\' 11"  (1.803 m), weight 102.967 kg (227 lb), SpO2 97 %.Body mass index is 31.67 kg/(m^2).   COGNITIVE FEATURES THAT CONTRIBUTE TO RISK:  Closed-mindedness, Polarized thinking and Thought constriction (tunnel vision)    SUICIDE RISK:   Moderate:  Frequent suicidal ideation with limited intensity, and duration, some specificity in terms of plans, no associated intent, good  self-control, limited dysphoria/symptomatology, some risk factors present, and identifiable protective factors, including available and accessible social support.  PLAN OF CARE: see admission H and P  I certify that inpatient services furnished can reasonably be expected to improve the patient's condition.   Rachael FeeLUGO,Katieann Hungate A, MD 05/23/2015, 5:15 PM

## 2015-05-23 NOTE — ED Notes (Signed)
Pt transported to [facility] by [facility]. 
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
## 2015-05-24 LAB — GLUCOSE, CAPILLARY: GLUCOSE-CAPILLARY: 172 mg/dL — AB (ref 65–99)

## 2015-05-24 MED ORDER — ZIPRASIDONE HCL 40 MG PO CAPS
40.0000 mg | ORAL_CAPSULE | Freq: Two times a day (BID) | ORAL | Status: DC
  Administered 2015-05-25: 40 mg via ORAL
  Filled 2015-05-24 (×3): qty 1

## 2015-05-24 MED ORDER — ZIPRASIDONE MESYLATE 20 MG IM SOLR
INTRAMUSCULAR | Status: AC
  Administered 2015-05-24: 15:00:00
  Filled 2015-05-24: qty 20

## 2015-05-24 MED ORDER — CARBAMAZEPINE ER 200 MG PO TB12
200.0000 mg | ORAL_TABLET | Freq: Two times a day (BID) | ORAL | Status: DC
  Administered 2015-05-24 – 2015-06-01 (×16): 200 mg via ORAL
  Filled 2015-05-24 (×23): qty 1

## 2015-05-24 MED ORDER — ZIPRASIDONE MESYLATE 20 MG IM SOLR
20.0000 mg | Freq: Once | INTRAMUSCULAR | Status: AC
  Administered 2015-05-24: 20 mg via INTRAMUSCULAR
  Filled 2015-05-24: qty 20
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
## 2015-05-24 NOTE — [facility] Suicide Risk Assessment (Signed)
[facility] INPATIENT:  Family/Significant Other Suicide Prevention Education  Suicide Prevention Education:  Patient Refusal for Family/Significant Other Suicide Prevention Education: The patient [name] has refused to provide written consent for family/significant other to be provided Family/Significant Other Suicide Prevention Education during admission and/or prior to discharge.  Physician notified.  SPE completed with pt, as pt refused to consent to family contact. SPI pamphlet provided to pt and pt was encouraged to share information with support network, ask questions, and talk about any concerns relating to SPE. Pt denies access to guns/firearms and verbalized understanding of information provided. Mobile Crisis information also provided to pt.    Smart, Rakesha Dalporto LCSW 05/24/2015, 3:22 PM

## 2015-05-24 NOTE — [facility] Group Notes (Signed)
[facility] LCSW Group Therapy  05/24/2015 1:01 PM  Type of Therapy:  Group Therapy  Participation Level:  Did Not Attend-pt chose to stay in bed.   Modes of Intervention:  Confrontation, Discussion, Education, Exploration, Problem-solving, Rapport Building, Socialization and Support  Summary of Progress/Problems: Emotion Regulation: This group focused on both positive and negative emotion identification and allowed group members to process ways to identify feelings, regulate negative emotions, and find healthy ways to manage internal/external emotions. Group members were asked to reflect on a time when their reaction to an emotion led to a negative outcome and explored how alternative responses using emotion regulation would have benefited them. Group members were also asked to discuss a time when emotion regulation was utilized when a negative emotion was experienced  [name], Ian Mone LCSW 05/24/2015, 1:01 PM

## 2015-05-24 NOTE — Progress Notes (Signed)
[facility] Progress Note  05/24/2015 6:59 PM Ian JubaHugh Donald [name]  MRN:  [id number] Subjective:  Ian GaussHugh became very agitated started banging his head against a wall. Continued to endorse he was wanting to kill himself that he does not want to go on. Continued to request medications but states he has taken them all and they do not work. He also does not want to take medications for his medical problems as states he has not taken in a year.  Principal Problem: MDD (major depressive disorder), recurrent episode, severe (HCC) Diagnosis:   Patient Active Problem List   Diagnosis Date Noted  . MDD (major depressive disorder), recurrent episode, severe (HCC) [F33.2] 05/23/2015  . Chronic pain syndrome [G89.4] 05/23/2015  . GAD (generalized anxiety disorder) [F41.1] 05/23/2015  . CVA (cerebral infarction) [I63.9] 10/15/2013  . Hypertension [I10] 10/15/2013  . Diabetes mellitus (HCC) [E11.9] 10/15/2013   Total Time spent with patient: 20 minutes  Past Psychiatric History: see admission H and P  Past Medical History:  Past Medical History  Diagnosis Date  . Stroke (HCC)   . Diabetes mellitus without complication (HCC)   . Hypertension   . Depression    History reviewed. No pertinent past surgical history. Family History: History reviewed. No pertinent family history. Family Psychiatric  History: see admission H and P Social History:  History  Alcohol Use No     History  Drug Use  . Yes  . Special: Marijuana, Cocaine    Social History   Social History  . Marital Status: Divorced    Spouse Name: N/A  . Number of Children: N/A  . Years of Education: N/A   Social History Main Topics  . Smoking status: Current Every Day Smoker -- 1.00 packs/day    Types: Cigarettes  . Smokeless tobacco: None  . Alcohol Use: No  . Drug Use: Yes    Special: Marijuana, Cocaine  . Sexual Activity: Not Asked     Comment: week ago   Other Topics Concern  . None   Social History Narrative   Additional  Social History:    Pain Medications: See PTA Prescriptions: See PTA Over the Counter: See PTA History of alcohol / drug use?: Yes Negative Consequences of Use: Financial, Personal relationships Withdrawal Symptoms: Agitation, Irritability Name of Substance 1: THC 1 - Age of First Use: 17 1 - Amount (size/oz): 1 gram per day 1 - Frequency: "whenever i can afford it" 1 - Duration: ongoing 1 - Last Use / Amount: "2 joints" yesterday Name of Substance 2: opiates 2 - Age of First Use: 58 2 - Amount (size/oz): $20 bag (heroin) 2 - Frequency: "very selfom" 2 - Duration: ongoing 2 - Last Use / Amount: 2 weeks ago                Sleep: Poor  Appetite:  Fair  Current Medications: Current Facility-Administered Medications  Medication Dose Route Frequency Provider Last Rate Last Dose  . acetaminophen (TYLENOL) tablet 650 mg  650 mg Oral Q4H PRN Earney NavyJosephine C Onuoha, NP      . alum & mag hydroxide-simeth (MAALOX/MYLANTA) 200-200-20 MG/5ML suspension 30 mL  30 mL Oral PRN Earney NavyJosephine C Onuoha, NP      . carbamazepine (TEGRETOL XR) 12 hr tablet 200 mg  200 mg Oral BID Rachael FeeIrving A Sebrina Kessner, MD      . DULoxetine (CYMBALTA) DR capsule 60 mg  60 mg Oral BID Rachael FeeIrving A Avy Barlett, MD   60 mg at 05/24/15 0858  .  gabapentin (NEURONTIN) capsule 800 mg  800 mg Oral QID Rachael Fee, MD   800 mg at 05/24/15 1200  . ibuprofen (ADVIL,MOTRIN) tablet 600 mg  600 mg Oral Q8H PRN Earney Navy, NP      . LORazepam (ATIVAN) tablet 1 mg  1 mg Oral Q8H PRN Earney Navy, NP   1 mg at 05/24/15 1459  . metFORMIN (GLUCOPHAGE) tablet 500 mg  500 mg Oral BID WC Earney Navy, NP   500 mg at 05/24/15 0858  . nicotine (NICODERM CQ - dosed in mg/24 hours) patch 21 mg  21 mg Transdermal Daily Rachael Fee, MD   21 mg at 05/24/15 0900  . ondansetron (ZOFRAN) tablet 4 mg  4 mg Oral Q8H PRN Earney Navy, NP      . pneumococcal 23 valent vaccine (PNU-IMMUNE) injection 0.5 mL  0.5 mL Intramuscular Tomorrow-1000  Rachael Fee, MD   0.5 mL at 05/24/15 1043  . traZODone (DESYREL) tablet 200 mg  200 mg Oral QHS PRN Earney Navy, NP      . ziprasidone (GEODON) 20 MG injection           . ziprasidone (GEODON) injection 20 mg  20 mg Intramuscular Once Rachael Fee, MD      . zolpidem [facility]) tablet 10 mg  10 mg Oral QHS PRN Rachael Fee, MD        Lab Results:  Results for orders placed or performed during the hospital encounter of 05/23/15 (from the past 48 hour(s))  Glucose, capillary     Status: Abnormal   Collection Time: 05/23/15  5:21 PM  Result Value Ref Range   Glucose-Capillary 231 (H) 65 - 99 mg/dL   Comment 1 Notify RN    Comment 2 Document in Chart   Glucose, capillary     Status: Abnormal   Collection Time: 05/23/15  9:47 PM  Result Value Ref Range   Glucose-Capillary 220 (H) 65 - 99 mg/dL  Glucose, capillary     Status: Abnormal   Collection Time: 05/24/15  6:34 AM  Result Value Ref Range   Glucose-Capillary 172 (H) 65 - 99 mg/dL    Blood Alcohol level:  Lab Results  Component Value Date   ETH <5 05/22/2015   ETH <11 10/15/2013    Physical Findings: AIMS: Facial and Oral Movements Muscles of Facial Expression: None, normal Lips and Perioral Area: None, normal Jaw: None, normal Tongue: None, normal,Extremity Movements Upper (arms, wrists, hands, fingers): None, normal Lower (legs, knees, ankles, toes): None, normal, Trunk Movements Neck, shoulders, hips: None, normal, Overall Severity Severity of abnormal movements (highest score from questions above): None, normal Incapacitation due to abnormal movements: None, normal Patient's awareness of abnormal movements (rate only patient's report): No Awareness, Dental Status Current problems with teeth and/or dentures?: Yes Does patient usually wear dentures?: No  CIWA:  CIWA-Ar Total: 5 COWS:  COWS Total Score: 2  Musculoskeletal: Strength & Muscle Tone: within normal limits Gait & Station: normal Patient leans:  normal  Psychiatric Specialty Exam: Review of Systems  Constitutional: Positive for malaise/fatigue.  HENT: Negative.   Eyes: Negative.   Cardiovascular: Negative.   Gastrointestinal: Negative.   Genitourinary: Negative.   Musculoskeletal: Positive for back pain.  Skin: Negative.   Neurological: Positive for weakness.  Psychiatric/Behavioral: Positive for depression and suicidal ideas. The patient is nervous/anxious.     Blood pressure 171/112, pulse 87, temperature 98.8 F (37.1 C), temperature source Oral,  resp. rate 16, height  (1.803 m), weight 102.967 kg (227 lb), SpO2 97 %.Body mass index is 31.67 kg/(m^2).  General Appearance: Disheveled  Eye Contact::  Minimal  Speech:  Clear and Coherent  Volume:  fluctuates  Mood:  Angry, Dysphoric, Hopeless and Irritable  Affect:  Labile and angry agitated  Thought Process:  Disorganized and Intact  Orientation:  Full (Time, Place, and Person)  Thought Content:  symptoms events worries concerns  Suicidal Thoughts:  Yes.  with intent/plan  Homicidal Thoughts:  No  Memory:  Immediate;   Fair Recent;   Fair Remote;   Fair  Judgement:  Poor  Insight:  Shallow  Psychomotor Activity:  Restlessness and agitated  Concentration:  Poor  Recall:  Poor  Fund of Knowledge:Fair  Language: Fair  Akathisia:  No  Handed:  Right  AIMS (if indicated):     Assets:  Desire for Improvement  ADL's:  Intact  Cognition: WNL  Sleep:      Treatment Plan Summary: Daily contact with patient to assess and evaluate symptoms and progress in treatment and Medication management Supportive approach/coping skills/maintain safety Acute agitation Geodon 20 mg IM Mood instability; Tegretol XR 200 mg BID Starting tomorrow AM; Geodon 40 mg BID  Estalee Mccandlish A, MD 05/24/2015, 6:59 PM

## 2015-05-24 NOTE — Progress Notes (Signed)
D    Pt has isolated to his room and has refused medications   Pt does allow staff to take his blood sugar and his vital signs A    Will monitor Q 15 min and give verbal support and encouragement R    Pt safe at present                               r

## 2015-05-24 NOTE — Tx Team (Signed)
Interdisciplinary Treatment Plan Update (Adult)  Date:  05/24/2015  Time Reviewed:  9:12 AM   Progress in Treatment: Attending groups: No. New to unit. Continuing to assess.  Participating in groups:  No. Taking medication as prescribed:  Yes. Tolerating medication:  Yes. Family/Significant othe contact made:  SPE required for this pt.  Patient understands diagnosis:  Yes. and As evidenced by:  seeking treatment for Discussing patient identified problems/goals with staff:  Yes. Medical problems stabilized or resolved:  Yes. Denies suicidal/homicidal ideation: No, pt continues to endorse passive SI/able to contract for safety on the unit.  Issues/concerns per patient self-inventory:  Other:  Discharge Plan or Barriers: CSW assessing for appropriate referrals. Pt reports that he is linked with [facility].   Reason for Continuation of Hospitalization: Depression Medication stabilization Suicidal ideation Withdrawal symptoms  Comments:  [name] is an 60 y.o. male presenting to [place] voluntarily for suicidal ideations with a plan to "overdose on whatever i can get my hands on that will kill me." Patient reports that his most recent stressor is pain and endorses symptoms of depression. Patient states that he has had "several" suicide attempts in the past and the last was "sometime last year" when he attempted to "overdose on drugs" and was treated in [place]. Patient denies HI and history of being violent towards others. Patient denies AVH and does not appear to be responding to internal stimuli.  Patient has a strong body odor and is dressed in scrubs. Patient states that he has had a [facility] for "fve or six years" and states that he last spoke with them prior to coming to the ED. Patient states that he does not use alcohol but states that he will use "pot and anything else that will stop the pain up to heroin." Patient states that he has used THC since the age of 45 and uses it  "wheverer I can afford it, like seven grams a week." Patient states that he last use "one or two joints this afternoon." Patient states that he started using opiates one year ago and usually uses a "$20 bag" and last used two weeks ago. Patient UDS + THC and BAL <5 at time of assessment.  Diagnosis: Major Depressive Disorder, Recurrent, Severe  Estimated length of stay:  3-5 days   New goal(s): to develop effective aftercare plan.  Additional Comments:  Patient and CSW reviewed pt's identified goals and treatment plan. Patient verbalized understanding and agreed to treatment plan. CSW reviewed [facility] "Discharge Process and Patient Involvement" Form. Pt verbalized understanding of information provided and signed form.    Review of initial/current patient goals per problem list:  1. Goal(s): Patient will participate in aftercare plan  AGT:XMIW progressing.   Target date: at discharge  As evidenced by: Patient will participate within aftercare plan AEB aftercare provider and housing plan at discharge being identified.  3/30: Pt reports that he is connected with [facility] for the past 5-6 years. CSW assessing for additional referrals/resources that may be helpful to patient.   2. Goal (s): Patient will exhibit decreased depressive symptoms and suicidal ideations.  Met: No.    Target date: at discharge  As evidenced by: Patient will utilize self rating of depression at 3 or below and demonstrate decreased signs of depression or be deemed stable for discharge by MD.  3/30: Pt endorses passive SI/able to contract for safety on the unit. Rates depression as high this morning with depressed mood/lethargic affect.  3. Goal(s): Patient will demonstrate decreased signs of withdrawal due to substance abuse  Met:No.   Target date:at discharge   As evidenced by: Patient will produce a CIWA/COWS score of 0, have stable vitals signs, and no symptoms of withdrawal.  3/30: Pt reports mild  withdrawals with COWS of 2 and high Sitting/Standing BP. All other vitals are stable.    Attendees: Patient:   05/24/2015 9:12 AM   Family:   05/24/2015 9:12 AM   Physician:  Dr. Carlton Adam, MD 05/24/2015 9:12 AM   Nursing:   Coralyn Mark RN 05/24/2015 9:12 AM   Clinical Social Worker: Maxie Better, LCSW 05/24/2015 9:12 AM   Clinical Social Worker: Erasmo Downer Drinkard LCSWA; Peri Maris LCSWA 05/24/2015 9:12 AM   Other:  Gerline Legacy Nurse Case Manager 05/24/2015 9:12 AM   Other:  Agustina Caroli NP  05/24/2015 9:12 AM   Other:   05/24/2015 9:12 AM   Other:  05/24/2015 9:12 AM   Other:  05/24/2015 9:12 AM   Other:  05/24/2015 9:12 AM    05/24/2015 9:12 AM    05/24/2015 9:12 AM    05/24/2015 9:12 AM    05/24/2015 9:12 AM    Scribe for Treatment Team:   Maxie Better, LCSW 05/24/2015 9:12 AM

## 2015-05-24 NOTE — Progress Notes (Addendum)
D:Pt transferred from 300 hall to 508-1 this afternoon. Per report; pt was observed banging his head on the wall, pt endorsed SI will not contract for safety. Mood is labile, verbal redirections ineffective at the time as pt continued to verbalize gestures of self harm. Pt was medicated (Geodon & Ativan) prior to transfer and has been asleep since arrival to unit without behavioral outburst to note thus far this shift. Respirations noted and unlabored.  A: Scheduled 1700 medications held as pt is asleep. 1:1 observations effective with assigned staff at bedside at all times. Safety checks maintained.  R: Pt asleep without self harm gestures or outburst to report at present.

## 2015-05-24 NOTE — Progress Notes (Signed)
DAR NOTE: Patient is very angry, irritable and agitated.  Mood and affect remained labile.  Denies auditory and visual hallucinations.  Endorses suicidal ideation but contracts for safety during assessment. Rates depression at 10, hopelessness at 10, and anxiety at 10.  Maintained on routine safety checks.  Medications given as prescribed.  Support and encouragement offered as needed.   Staff nurse heard patient banging his head, using profanity and agitated.  Staff went in to assess patient and redirected patient.  Patient states I will hurt myself if I want to and there is nothing anybody can do about it.  Ativan 1 mg and Geodon 20 mg IM given.  Patient placed on constant supervision for safety.  Patient unable to contract for safety at this time.

## 2015-05-25 DIAGNOSIS — F1121 Opioid dependence, in remission: Secondary | ICD-10-CM | POA: Diagnosis present

## 2015-05-25 DIAGNOSIS — F1190 Opioid use, unspecified, uncomplicated: Secondary | ICD-10-CM

## 2015-05-25 DIAGNOSIS — F1220 Cannabis dependence, uncomplicated: Secondary | ICD-10-CM | POA: Diagnosis present

## 2015-05-25 DIAGNOSIS — F1310 Sedative, hypnotic or anxiolytic abuse, uncomplicated: Secondary | ICD-10-CM

## 2015-05-25 LAB — LIPID PANEL
CHOLESTEROL: 279 mg/dL — AB (ref 0–200)
HDL: 31 mg/dL — ABNORMAL LOW (ref >40)
LDL Cholesterol: 204 mg/dL — ABNORMAL HIGH (ref 0–99)
TRIGLYCERIDES: 219 mg/dL — AB (ref <150)
Total CHOL/HDL Ratio: 9 RATIO
VLDL: 44 mg/dL — ABNORMAL HIGH (ref 0–40)

## 2015-05-25 MED ORDER — DULOXETINE HCL 30 MG PO CPEP
30.0000 mg | ORAL_CAPSULE | Freq: Two times a day (BID) | ORAL | Status: DC
  Filled 2015-05-25 (×2): qty 1

## 2015-05-25 MED ORDER — ZOLPIDEM TARTRATE 10 MG PO TABS
10.0000 mg | ORAL_TABLET | Freq: Every evening | ORAL | Status: DC | PRN
  Administered 2015-05-25 – 2015-05-26 (×2): 10 mg via ORAL
  Filled 2015-05-25 (×2): qty 1

## 2015-05-25 MED ORDER — DULOXETINE HCL 30 MG PO CPEP
30.0000 mg | ORAL_CAPSULE | Freq: Every day | ORAL | Status: DC
  Administered 2015-05-26 – 2015-05-31 (×6): 30 mg via ORAL
  Filled 2015-05-25 (×8): qty 1

## 2015-05-25 MED ORDER — QUETIAPINE FUMARATE 50 MG PO TABS
50.0000 mg | ORAL_TABLET | Freq: Every day | ORAL | Status: DC
  Administered 2015-05-25 – 2015-05-26 (×2): 50 mg via ORAL
  Filled 2015-05-25 (×5): qty 1

## 2015-05-25 MED ORDER — ZOLPIDEM TARTRATE 10 MG PO TABS: 10.0000 mg | ORAL_TABLET | Freq: Every day | ORAL | Status: DC

## 2015-05-25 MED ORDER — QUETIAPINE FUMARATE 50 MG PO TABS
50.0000 mg | ORAL_TABLET | Freq: Three times a day (TID) | ORAL | Status: DC | PRN
  Administered 2015-05-25 – 2015-05-29 (×4): 50 mg via ORAL
  Filled 2015-05-25 (×2): qty 1

## 2015-05-25 MED ORDER — SIMVASTATIN 20 MG PO TABS
20.0000 mg | ORAL_TABLET | Freq: Every day | ORAL | Status: DC
  Administered 2015-05-25 – 2015-05-31 (×7): 20 mg via ORAL
  Filled 2015-05-25 (×9): qty 1

## 2015-05-25 MED ORDER — CLONIDINE HCL 0.1 MG PO TABS
0.1000 mg | ORAL_TABLET | Freq: Once | ORAL | Status: AC
  Administered 2015-05-25: 0.1 mg via ORAL
  Filled 2015-05-25 (×2): qty 1

## 2015-05-25 MED ORDER — FLUVOXAMINE MALEATE 50 MG PO TABS
25.0000 mg | ORAL_TABLET | Freq: Every day | ORAL | Status: DC
  Administered 2015-05-25: 25 mg via ORAL
  Filled 2015-05-25 (×3): qty 1

## 2015-05-25 NOTE — Progress Notes (Signed)
Nursing 1:1 note D:Pt observed lying in bed. RR even and unlabored. No distress noted. A: 1:1 observation continues for safety  R: pt remains safe  

## 2015-05-25 NOTE — Progress Notes (Addendum)
[facility] MD Progress Note  05/25/2015 12:47 PM [name]  MRN:  [id number] Subjective:  Pt states " I am very anxious, I had a panic attack and yesterday I punched on the wall with my fist since that doctor will not give me anything that will help me. Xanax helps me a lot and my out patient doctor took me off of it two years ago."  Objective: Pt is a 60 Y/O caucasian male who lives in [place] , has a hx of panic attacks , depressions, opioid use disorder, BZD abuse and cannabis abuse, who presented with thoughts of self harm.  Patient seen and chart reviewed.Discussed patient with treatment team.  Pt today initially was able to talk to writer and respond to all questions asked , but later on was observed as restless, fidgety and rocking back and forth, cursing and appeared to be irritable and angry. Pt reports that he has panic attacks often which comes on as restlessness, anxiety, chest pain and it usually responds only to xanax. Pt has a hx of substance abuse including opioids, xanax, cannabis and hence was taken off the xanax by out pt provider. Pt had an episode yesterday on [address] where he was seen as agitated and exhibiting self injurious behaviors. Pt hence is currently on 1:1 precaution for safety. Per staff pt continues to need encouragement and support.     Principal Problem: MDD (major depressive disorder), recurrent episode, severe (HCC) Diagnosis:   Patient Active Problem List   Diagnosis Date Noted  . Opioid use disorder, severe, in sustained remission [F11.90] 05/25/2015  . Benzodiazepine abuse [F13.10] 05/25/2015  . Cannabis use disorder, moderate, dependence (HCC) [F12.20] 05/25/2015  . MDD (major depressive disorder), recurrent episode, severe (HCC) [F33.2] 05/23/2015  . Chronic pain syndrome [G89.4] 05/23/2015  . GAD (generalized anxiety disorder) [F41.1] 05/23/2015  . CVA (cerebral infarction) [I63.9] 10/15/2013  . Hypertension [I10] 10/15/2013  . Diabetes  mellitus (HCC) [E11.9] 10/15/2013   Total Time spent with patient: 30 minutes  Past Psychiatric History: see admission H and P  Past Medical History:  Past Medical History  Diagnosis Date  . Stroke (HCC)   . Diabetes mellitus without complication (HCC)   . Hypertension   . Depression    History reviewed. No pertinent past surgical history. Family History:Pt reports he does not know details about family hx . Family Psychiatric  History: Patient's father had mental illness, he shot self and patient found him . Pt's grandfather also had mental illness and killed self. Social History: Please see H&p. History  Alcohol Use No     History  Drug Use  . Yes  . Special: Marijuana, Cocaine    Social History   Social History  . Marital Status: Divorced    Spouse Name: N/A  . Number of Children: N/A  . Years of Education: N/A   Social History Main Topics  . Smoking status: Current Every Day Smoker -- 1.00 packs/day    Types: Cigarettes  . Smokeless tobacco: None  . Alcohol Use: No  . Drug Use: Yes    Special: Marijuana, Cocaine  . Sexual Activity: Not Asked     Comment: week ago   Other Topics Concern  . None   Social History Narrative   Additional Social History:    Pain Medications: See PTA Prescriptions: See PTA Over the Counter: See PTA History of alcohol / drug use?: Yes Negative Consequences of Use: Financial, Personal relationships Withdrawal Symptoms: Agitation, Irritability Name of  Substance 1: THC 1 - Age of First Use: 17 1 - Amount (size/oz): 1 gram per day 1 - Frequency: "whenever i can afford it" 1 - Duration: ongoing 1 - Last Use / Amount: "2 joints" yesterday Name of Substance 2: opiates 2 - Age of First Use: 58 2 - Amount (size/oz): $20 bag (heroin) 2 - Frequency: "very selfom" 2 - Duration: ongoing 2 - Last Use / Amount: 2 weeks ago                Sleep: Fair  Appetite:  Fair  Current Medications: Current Facility-Administered  Medications  Medication Dose Route Frequency Provider Last Rate Last Dose  . acetaminophen (TYLENOL) tablet 650 mg  650 mg Oral Q4H PRN Earney Navy, NP      . alum & mag hydroxide-simeth (MAALOX/MYLANTA) 200-200-20 MG/5ML suspension 30 mL  30 mL Oral PRN Earney Navy, NP      . carbamazepine (TEGRETOL XR) 12 hr tablet 200 mg  200 mg Oral BID Rachael Fee, MD   200 mg at 05/25/15 [id number]  . [START ON 05/26/2015] DULoxetine (CYMBALTA) DR capsule 30 mg  30 mg Oral Daily Eathon Valade, MD      . fluvoxaMINE (LUVOX) tablet 25 mg  25 mg Oral QHS Lailah Marcelli, MD      . gabapentin (NEURONTIN) capsule 800 mg  800 mg Oral QID Rachael Fee, MD   800 mg at 05/25/15 1210  . ibuprofen (ADVIL,MOTRIN) tablet 600 mg  600 mg Oral Q8H PRN Earney Navy, NP      . LORazepam (ATIVAN) tablet 1 mg  1 mg Oral Q8H PRN Earney Navy, NP   1 mg at 05/25/15 0157  . metFORMIN (GLUCOPHAGE) tablet 500 mg  500 mg Oral BID WC Earney Navy, NP   500 mg at 05/25/15 0842  . nicotine (NICODERM CQ - dosed in mg/24 hours) patch 21 mg  21 mg Transdermal Daily Rachael Fee, MD   21 mg at 05/25/15 0846  . ondansetron (ZOFRAN) tablet 4 mg  4 mg Oral Q8H PRN Earney Navy, NP      . pneumococcal 23 valent vaccine (PNU-IMMUNE) injection 0.5 mL  0.5 mL Intramuscular Tomorrow-1000 Rachael Fee, MD   0.5 mL at 05/24/15 1043  . QUEtiapine (SEROQUEL) tablet 50 mg  50 mg Oral QHS Cheryle Dark, MD      . QUEtiapine (SEROQUEL) tablet 50 mg  50 mg Oral TID PRN Jomarie Longs, MD      . simvastatin (ZOCOR) tablet 20 mg  20 mg Oral q1800 Haralambos Yeatts, MD      . zolpidem (AMBIEN) tablet 10 mg  10 mg Oral QHS PRN Jomarie Longs, MD        Lab Results:  Results for orders placed or performed during the hospital encounter of 05/23/15 (from the past 48 hour(s))  Glucose, capillary     Status: Abnormal   Collection Time: 05/23/15  5:21 PM  Result Value Ref Range   Glucose-Capillary 231 (H) 65 - 99 mg/dL    Comment 1 Notify RN    Comment 2 Document in Chart   Glucose, capillary     Status: Abnormal   Collection Time: 05/23/15  9:47 PM  Result Value Ref Range   Glucose-Capillary 220 (H) 65 - 99 mg/dL  Glucose, capillary     Status: Abnormal   Collection Time: 05/24/15  6:34 AM  Result Value Ref Range   Glucose-Capillary  172 (H) 65 - 99 mg/dL  Lipid panel     Status: Abnormal   Collection Time: 05/25/15  6:30 AM  Result Value Ref Range   Cholesterol 279 (H) 0 - 200 mg/dL   Triglycerides 914 (H) <150 mg/dL   HDL 31 (L) >78 mg/dL   Total CHOL/HDL Ratio 9.0 RATIO   VLDL 44 (H) 0 - 40 mg/dL   LDL Cholesterol 295 (H) 0 - 99 mg/dL    Comment:        Total Cholesterol/HDL:CHD Risk Coronary Heart Disease Risk Table                     Men   Women  1/2 Average Risk   3.4   3.3  Average Risk       5.0   4.4  2 X Average Risk   9.6   7.1  3 X Average Risk  23.4   11.0        Use the calculated Patient Ratio above and the CHD Risk Table to determine the patient's CHD Risk.        ATP III CLASSIFICATION (LDL):  <100     mg/dL   Optimal  621-308  mg/dL   Near or Above                    Optimal  130-159  mg/dL   Borderline  657-846  mg/dL   High  >962     mg/dL   Very High Performed at [facility]     Blood Alcohol level:  Lab Results  Component Value Date   [facility] <5 05/22/2015   ETH <11 10/15/2013    Physical Findings: AIMS: Facial and Oral Movements Muscles of Facial Expression: None, normal Lips and Perioral Area: None, normal Jaw: None, normal Tongue: None, normal,Extremity Movements Upper (arms, wrists, hands, fingers): None, normal Lower (legs, knees, ankles, toes): None, normal, Trunk Movements Neck, shoulders, hips: None, normal, Overall Severity Severity of abnormal movements (highest score from questions above): None, normal Incapacitation due to abnormal movements: None, normal Patient's awareness of abnormal movements (rate only patient's report): No  Awareness, Dental Status Current problems with teeth and/or dentures?: Yes Does patient usually wear dentures?: No  CIWA:  CIWA-Ar Total: 5 COWS:  COWS Total Score: 2  Musculoskeletal: Strength & Muscle Tone: within normal limits Gait & Station: normal Patient leans: normal  Psychiatric Specialty Exam: Review of Systems  Constitutional: Positive for malaise/fatigue.  HENT: Negative.   Eyes: Negative.   Cardiovascular: Negative.   Gastrointestinal: Negative.   Genitourinary: Negative.   Musculoskeletal: Positive for back pain.  Skin: Negative.   Psychiatric/Behavioral: Positive for depression and suicidal ideas (self injurious behavior). The patient is nervous/anxious.   All other systems reviewed and are negative.   Blood pressure 159/91, pulse 101, temperature 98.2 F (36.8 C), temperature source Oral, resp. rate 18, height  (1.803 m), weight 102.967 kg (227 lb), SpO2 97 %.Body mass index is 31.67 kg/(m^2).  General Appearance: Disheveled  Eye Contact::  Minimal  Speech:  Clear and Coherent  Volume:  fluctuates  Mood:  Angry, Dysphoric, Hopeless and Irritable  Affect:  Labile and angry agitated  Thought Process:  Linear  Orientation:  Full (Time, Place, and Person)  Thought Content:  Rumination  Suicidal Thoughts:  Yes.  with intent/plan has self injurious behavior  Homicidal Thoughts:  No  Memory:  Immediate;   Fair Recent;   Fair Remote;  Fair  Judgement:  Poor  Insight:  Shallow  Psychomotor Activity:  Restlessness and agitated  Concentration:  Poor  Recall:  Poor  Fund of Knowledge:Fair  Language: Fair  Akathisia:  No  Handed:  Right  AIMS (if indicated):     Assets:  Desire for Improvement  ADL's:  Intact  Cognition: WNL  Sleep:  Number of Hours: 6   Treatment Plan Summary:Pt is a 60 Y/O caucasian male who lives in [place] , has a hx of panic attacks , depressions, opioid use disorder, BZD abuse and cannabis abuse, who presented with thoughts of  self harm.Pt today continues to be irritable, restless and anxious. Will continue treatment.  Daily contact with patient to assess and evaluate symptoms and progress in treatment and Medication management Reviewed past medical records,treatment plan.  Will continue Tegretol xr 200 mg po bid for mood lability. Tegretol level in 3 days. Will start a trial of seroquel 50 mg po qhs for sleep/mood sx as well as seroquel 50 mg po tid prn for severe anxiety/agitation. Will make Ambien 10 mg po qhs prn for sleep. Will DC once pt is able to tolerate seroquel. Will reduce Cymbalta to 30 mg po daily - will continue this dose since it helps with pain. Will add Luvox 25 mg po qhs for affective sx, titrate higher as needed. Will discontinue Trazodone 200 mg po qhs prn - risk of serotonic syndrome on multiple medications. Will continue Gabapentin 800 mg po qid for anxiety/pain. Will continue to monitor vitals ,medication compliance and treatment side effects while patient is here.  Will monitor for medical issues as well as call consult as needed. Restart home medications where indicated. Reviewed labs - add Zocor 20 mg po qpm for abnormal lipid panel. ,will order TSH, pl , ekg pending. CSW will continue working on disposition.  Patient to participate in therapeutic milieu .       Jalik Gellatly, MD 05/25/2015, 12:47 PM

## 2015-05-25 NOTE — Progress Notes (Signed)
Nursing 1:1 note D:Pt observed sitting in room A: 1:1 observation continues for safety  R: pt remains safe  

## 2015-05-25 NOTE — Progress Notes (Signed)
Nursing 1:1 note D:Pt observed sitting in room, talking to 1:1 sitter A: 1:1 observation continues for safety  R: pt remains safe

## 2015-05-25 NOTE — Progress Notes (Signed)
Nursing 1:1 note D:Pt observed sitting in room A: 1:1 observation continues for safety  R: pt remains safe

## 2015-05-25 NOTE — Progress Notes (Signed)
1:1 Note: Patient maintained on constant supervision for safety.  Patient affect is more bright.  Patient interacting and laughing with staff and other patient in the dayroom.  Medications given as prescribed.  Routine safety checks continues. Patient in the dayroom watching TV.  No distress noted.

## 2015-05-25 NOTE — Progress Notes (Signed)
1:1 Note: Patient maintained on constant supervision for safety.  Patient in and out of his as desired.  Patient was irritable during assessment with the doctor.  Patient was using profanity as the doctor was walking away from him.  Medication given as prescribed.  Patient continues to endorse suicidal thoughts on self inventory form but denies suicidal thoughts during assessment.  Patient in his room sleeping.  No issues noted or reported.

## 2015-05-25 NOTE — [facility] Group Notes (Signed)
[facility] LCSW Group Therapy  05/25/2015  1:05 PM  Type of Therapy:  Group therapy  Participation Level:  Active  Participation Quality:  Attentive  Affect:  Flat  Cognitive:  Oriented  Insight:  Limited  Engagement in Therapy:  Limited  Modes of Intervention:  Discussion, Socialization  Summary of Progress/Problems:  Chaplain was here to lead a group on themes of hope and courage.  Invited.  Chose to not attend.  Daryel Geraldorth, Keny Donald B 05/25/2015 1:41 PM

## 2015-05-25 NOTE — Progress Notes (Signed)
1:1 Note: Patient maintained on constant supervision  For safety.  Patient denies suicidal ideation, auditory and visual hallucination during assessment.  Medication given as prescribed.  Support and encouragement offered as needed.  Patient was pleasant and appreciative.

## 2015-05-25 NOTE — Plan of Care (Signed)
Problem: Alteration in mood; excessive anxiety as evidenced by: Goal: LTG-Patient's behavior demonstrates decreased anxiety (Patient's behavior demonstrates anxiety and he/she is utilizing learned coping skills to deal with anxiety-producing situations)  Outcome: Not Progressing Pt complaining of increased anxiety this evening  Problem: Alteration in mood Goal: LTG-Patient reports reduction in suicidal thoughts (Patient reports reduction in suicidal thoughts and is able to verbalize a safety plan for whenever patient is feeling suicidal)  Outcome: Not Progressing Pt still endorsed SI, pt remains on 1:1 for safety

## 2015-05-26 LAB — HEMOGLOBIN A1C
Hgb A1c MFr Bld: 9.4 % — ABNORMAL HIGH (ref 4.8–5.6)
Mean Plasma Glucose: 223 mg/dL

## 2015-05-26 LAB — TSH: TSH: 1.887 u[IU]/mL (ref 0.350–4.500)

## 2015-05-26 MED ORDER — AMLODIPINE BESYLATE 5 MG PO TABS
5.0000 mg | ORAL_TABLET | Freq: Every day | ORAL | Status: DC
  Administered 2015-05-26 – 2015-05-27 (×2): 5 mg via ORAL
  Filled 2015-05-26 (×3): qty 1

## 2015-05-26 MED ORDER — METHOCARBAMOL 500 MG PO TABS
500.0000 mg | ORAL_TABLET | Freq: Three times a day (TID) | ORAL | Status: DC
  Administered 2015-05-26 – 2015-06-01 (×19): 500 mg via ORAL
  Filled 2015-05-26 (×24): qty 1

## 2015-05-26 MED ORDER — FLUVOXAMINE MALEATE 50 MG PO TABS
50.0000 mg | ORAL_TABLET | Freq: Every day | ORAL | Status: DC
  Administered 2015-05-26 – 2015-05-30 (×5): 50 mg via ORAL
  Filled 2015-05-26 (×8): qty 1

## 2015-05-26 MED ORDER — METHOCARBAMOL 500 MG PO TABS
ORAL_TABLET | ORAL | Status: AC
  Filled 2015-05-26: qty 1

## 2015-05-26 NOTE — Progress Notes (Signed)
[facility] Post 1:1 Observation Documentation  For the first (8) hours following discontinuation of 1:1 precautions, a progress note entry by nursing staff should be documented at least every 2 hours, reflecting the patient's behavior, condition, mood, and conversation.  Use the progress notes for additional entries.  Time 1:1 discontinued:  1015AM  Patient's Behavior:  Patient is alert and oriented x 4.  Patient pacing back and forth on the unit.  Patient's Condition:  Patient is calm and pleasant.  Patient's Conversation:  Minimal interaction with staff.  Mickie Baillizabeth O Iwenekha 05/26/2015, 6:55 PM

## 2015-05-26 NOTE — Progress Notes (Addendum)
[facility] MD Progress Note  05/26/2015 2:57 PM [name]  MRN:  [id number] Subjective:  Pt states " I am better today."    Objective: Pt is a 60 Y/O caucasian male who lives in [place] , has a hx of panic attacks , depressions, opioid use disorder, BZD abuse and cannabis abuse, who presented with thoughts of self harm.  Patient seen and chart reviewed.Discussed patient with treatment team.  Pt today appears less anxious , vaguely irritable , but overall making progress. Pt reports he does not have any thoughts of self harm and contracts for safety. Pt reports he has been tolerating his medications well. Per staff pt continues to need encouragement and support.     Principal Problem: MDD (major depressive disorder), recurrent episode, severe (HCC) Diagnosis:   Patient Active Problem List   Diagnosis Date Noted  . Opioid use disorder, severe, in sustained remission [F11.90] 05/25/2015  . Benzodiazepine abuse [F13.10] 05/25/2015  . Cannabis use disorder, moderate, dependence (HCC) [F12.20] 05/25/2015  . MDD (major depressive disorder), recurrent episode, severe (HCC) [F33.2] 05/23/2015  . Chronic pain syndrome [G89.4] 05/23/2015  . GAD (generalized anxiety disorder) [F41.1] 05/23/2015  . CVA (cerebral infarction) [I63.9] 10/15/2013  . Hypertension [I10] 10/15/2013  . Diabetes mellitus (HCC) [E11.9] 10/15/2013   Total Time spent with patient: 25 minutes  Past Psychiatric History: see admission H and P  Past Medical History:  Past Medical History  Diagnosis Date  . Stroke (HCC)   . Diabetes mellitus without complication (HCC)   . Hypertension   . Depression    History reviewed. No pertinent past surgical history. Family History:Pt reports he does not know details about family hx . Family Psychiatric  History: Patient's father had mental illness, he shot self and patient found him . Pt's grandfather also had mental illness and killed self. Social History: Please see  H&p. History  Alcohol Use No     History  Drug Use  . Yes  . Special: Marijuana, Cocaine    Social History   Social History  . Marital Status: Divorced    Spouse Name: N/A  . Number of Children: N/A  . Years of Education: N/A   Social History Main Topics  . Smoking status: Current Every Day Smoker -- 1.00 packs/day    Types: Cigarettes  . Smokeless tobacco: None  . Alcohol Use: No  . Drug Use: Yes    Special: Marijuana, Cocaine  . Sexual Activity: Not Asked     Comment: week ago   Other Topics Concern  . None   Social History Narrative   Additional Social History:    Pain Medications: See PTA Prescriptions: See PTA Over the Counter: See PTA History of alcohol / drug use?: Yes Negative Consequences of Use: Financial, Personal relationships Withdrawal Symptoms: Agitation, Irritability Name of Substance 1: THC 1 - Age of First Use: 17 1 - Amount (size/oz): 1 gram per day 1 - Frequency: "whenever i can afford it" 1 - Duration: ongoing 1 - Last Use / Amount: "2 joints" yesterday Name of Substance 2: opiates 2 - Age of First Use: 58 2 - Amount (size/oz): $20 bag (heroin) 2 - Frequency: "very selfom" 2 - Duration: ongoing 2 - Last Use / Amount: 2 weeks ago                Sleep: Fair  Appetite:  Fair  Current Medications: Current Facility-Administered Medications  Medication Dose Route Frequency Provider Last Rate Last Dose  . acetaminophen (  TYLENOL) tablet 650 mg  650 mg Oral Q4H PRN Earney Navy, NP   650 mg at 05/25/15 1646  . alum & mag hydroxide-simeth (MAALOX/MYLANTA) 200-200-20 MG/5ML suspension 30 mL  30 mL Oral PRN Earney Navy, NP      . amLODipine (NORVASC) tablet 5 mg  5 mg Oral Daily Alleene Stoy, MD   5 mg at 05/26/15 1040  . carbamazepine (TEGRETOL XR) 12 hr tablet 200 mg  200 mg Oral BID Rachael Fee, MD   200 mg at 05/26/15 0826  . DULoxetine (CYMBALTA) DR capsule 30 mg  30 mg Oral Daily Jomarie Longs, MD   30 mg at  05/26/15 0826  . fluvoxaMINE (LUVOX) tablet 50 mg  50 mg Oral QHS Defne Gerling, MD      . gabapentin (NEURONTIN) capsule 800 mg  800 mg Oral QID Rachael Fee, MD   800 mg at 05/26/15 1141  . ibuprofen (ADVIL,MOTRIN) tablet 600 mg  600 mg Oral Q8H PRN Earney Navy, NP      . LORazepam (ATIVAN) tablet 1 mg  1 mg Oral Q8H PRN Earney Navy, NP   1 mg at 05/25/15 0157  . metFORMIN (GLUCOPHAGE) tablet 500 mg  500 mg Oral BID WC Earney Navy, NP   500 mg at 05/26/15 0826  . methocarbamol (ROBAXIN) 500 MG tablet           . methocarbamol (ROBAXIN) tablet 500 mg  500 mg Oral TID Jomarie Longs, MD   500 mg at 05/26/15 1156  . nicotine (NICODERM CQ - dosed in mg/24 hours) patch 21 mg  21 mg Transdermal Daily Rachael Fee, MD   21 mg at 05/26/15 1610  . ondansetron (ZOFRAN) tablet 4 mg  4 mg Oral Q8H PRN Earney Navy, NP      . pneumococcal 23 valent vaccine (PNU-IMMUNE) injection 0.5 mL  0.5 mL Intramuscular Tomorrow-1000 Rachael Fee, MD   0.5 mL at 05/24/15 1043  . QUEtiapine (SEROQUEL) tablet 50 mg  50 mg Oral QHS Jomarie Longs, MD   50 mg at 05/25/15 2152  . QUEtiapine (SEROQUEL) tablet 50 mg  50 mg Oral TID PRN Jomarie Longs, MD   50 mg at 05/25/15 2038  . simvastatin (ZOCOR) tablet 20 mg  20 mg Oral q1800 Jomarie Longs, MD   20 mg at 05/25/15 1829  . zolpidem (AMBIEN) tablet 10 mg  10 mg Oral QHS PRN Jomarie Longs, MD   10 mg at 05/25/15 2152    Lab Results:  Results for orders placed or performed during the hospital encounter of 05/23/15 (from the past 48 hour(s))  Hemoglobin A1c     Status: Abnormal   Collection Time: 05/25/15  6:30 AM  Result Value Ref Range   Hgb A1c MFr Bld 9.4 (H) 4.8 - 5.6 %    Comment: (NOTE)         Pre-diabetes: 5.7 - 6.4         Diabetes: >6.4         Glycemic control for adults with diabetes: <7.0    Mean Plasma Glucose 223 mg/dL    Comment: (NOTE) Performed At: [facility] [address] [place], [place]  [id number] Mila Homer MD JX:[id number] Performed at [facility]   Lipid panel     Status: Abnormal   Collection Time: 05/25/15  6:30 AM  Result Value Ref Range   Cholesterol 279 (H) 0 - 200 mg/dL  Triglycerides 219 (H) <150 mg/dL   HDL 31 (L) >84 mg/dL   Total CHOL/HDL Ratio 9.0 RATIO   VLDL 44 (H) 0 - 40 mg/dL   LDL Cholesterol 696 (H) 0 - 99 mg/dL    Comment:        Total Cholesterol/HDL:CHD Risk Coronary Heart Disease Risk Table                     Men   Women  1/2 Average Risk   3.4   3.3  Average Risk       5.0   4.4  2 X Average Risk   9.6   7.1  3 X Average Risk  23.4   11.0        Use the calculated Patient Ratio above and the CHD Risk Table to determine the patient's CHD Risk.        ATP III CLASSIFICATION (LDL):  <100     mg/dL   Optimal  295-284  mg/dL   Near or Above                    Optimal  130-159  mg/dL   Borderline  132-440  mg/dL   High  >102     mg/dL   Very High Performed at [facility]   TSH     Status: None   Collection Time: 05/26/15  6:43 AM  Result Value Ref Range   TSH 1.887 0.350 - 4.500 uIU/mL    Comment: Performed at [facility]    Blood Alcohol level:  Lab Results  Component Value Date   [facility] <5 05/22/2015   ETH <11 10/15/2013    Physical Findings: AIMS: Facial and Oral Movements Muscles of Facial Expression: None, normal Lips and Perioral Area: None, normal Jaw: None, normal Tongue: None, normal,Extremity Movements Upper (arms, wrists, hands, fingers): None, normal Lower (legs, knees, ankles, toes): None, normal, Trunk Movements Neck, shoulders, hips: None, normal, Overall Severity Severity of abnormal movements (highest score from questions above): None, normal Incapacitation due to abnormal movements: None, normal Patient's awareness of abnormal movements (rate only patient's report): No Awareness, Dental Status Current problems with teeth and/or dentures?: Yes Does  patient usually wear dentures?: No  CIWA:  CIWA-Ar Total: 5 COWS:  COWS Total Score: 2  Musculoskeletal: Strength & Muscle Tone: within normal limits Gait & Station: normal Patient leans: normal  Psychiatric Specialty Exam: Review of Systems  Constitutional: Positive for malaise/fatigue.  HENT: Negative.   Eyes: Negative.   Cardiovascular: Negative.   Gastrointestinal: Negative.   Genitourinary: Negative.   Musculoskeletal: Positive for back pain.  Skin: Negative.   Psychiatric/Behavioral: Positive for depression. Suicidal ideas: self injurious behavior on admission, denies today. The patient is nervous/anxious.   All other systems reviewed and are negative.   Blood pressure 145/94, pulse 99, temperature 98.5 F (36.9 C), temperature source Oral, resp. rate 16, height  (1.803 m), weight 102.967 kg (227 lb), SpO2 97 %.Body mass index is 31.67 kg/(m^2).  General Appearance: Disheveled  Eye Contact::  Minimal  Speech:  Clear and Coherent  Volume:  Normal  Mood:  Anxious, Hopeless and Irritable  Affect:  Labile  Thought Process:  Linear  Orientation:  Full (Time, Place, and Person)  Thought Content:  Rumination  Suicidal Thoughts:  No   Homicidal Thoughts:  No  Memory:  Immediate;   Fair Recent;   Fair Remote;   Fair  Judgement:  Poor  Insight:  Shallow  Psychomotor Activity:  Restlessness  Concentration:  Poor  Recall:  Fair  Fund of Knowledge:Fair  Language: Fair  Akathisia:  No  Handed:  Right  AIMS (if indicated):     Assets:  Desire for Improvement  ADL's:  Intact  Cognition: WNL  Sleep:  Number of Hours: 3.25   Treatment Plan Summary:Pt is a 60 Y/O caucasian male who lives in [place] , has a hx of panic attacks , depressions, opioid use disorder, BZD abuse and cannabis abuse, who presented with thoughts of self harm.Pt today continues to be irritable,  anxious, although progressing. Will continue treatment.  Daily contact with patient to assess and  evaluate symptoms and progress in treatment and Medication management Reviewed past medical records,treatment plan. Will discontinue 1:1 precaution for safety.  Will continue Tegretol xr 200 mg po bid for mood lability. Tegretol level in 3 days. Will continue seroquel 50 mg po qhs for sleep/mood sx as well as seroquel 50 mg po tid prn for severe anxiety/agitation. Will make Ambien 10 mg po qhs prn for sleep. Will DC once pt is able to tolerate seroquel. Will reduce Cymbalta to 30 mg po daily - will continue this dose since it helps with pain. Will increase Luvox to 50 mg po qhs for affective sx, titrate higher as needed. Will discontinue Trazodone 200 mg po qhs prn - risk of serotonic syndrome on multiple medications. Will continue Gabapentin 800 mg po qid for anxiety/pain. Will continue to monitor vitals ,medication compliance and treatment side effects while patient is here.  Will monitor for medical issues as well as call consult as needed. Restart home medications where indicated. Reviewed labs - added Zocor 20 mg po qpm for abnormal lipid panel, hba1c- 9.4, ekg pending . CSW will continue working on disposition.  Patient to participate in therapeutic milieu .       Jahnaya Branscome, MD 05/26/2015, 2:57 PM

## 2015-05-26 NOTE — Progress Notes (Signed)
[facility] Post 1:1 Observation Documentation  For the first (8) hours following discontinuation of 1:1 precautions, a progress note entry by nursing staff should be documented at least every 2 hours, reflecting the patient's behavior, condition, mood, and conversation.  Use the progress notes for additional entries.  Time 1:1 discontinued:  1015AM Patient's Behavior:  Patient is calm and pleasant.  Patient is sitting in the dayroom watching TV.  Patient's Condition:  Patient is alert and oriented x 4.  Patient's Conversation:  Patient interacting with staff and peers.  Mickie Baillizabeth O Iwenekha 05/26/2015, 6:43 PM

## 2015-05-26 NOTE — [facility] Group Notes (Signed)
[facility] Group Notes:  (Nursing/MHT/Case Management/Adjunct)  Date:  05/26/2015  Time:  11:18 AM  Type of Therapy:  Nurse Education  Participation Level:  Did Not Attend  [name] 05/26/2015, 11:18 AM

## 2015-05-26 NOTE — [facility] Group Notes (Signed)
[facility] Group Notes: (Clinical Social Work)   05/26/2015      Type of Therapy:  Group Therapy   Participation Level:  Did Not Attend despite MHT prompting   Velisa Regnier Grossman-Orr, LCSW 05/26/2015, 1:34 PM     

## 2015-05-26 NOTE — Progress Notes (Signed)
[facility] Post 1:1 Observation Documentation  For the first (8) hours following discontinuation of 1:1 precautions, a progress note entry by nursing staff should be documented at least every 2 hours, reflecting the patient's behavior, condition, mood, and conversation.  Use the progress notes for additional entries.  Time 1:1 discontinued:  1015AM  Patient's Behavior:  Patient is calm and pleasant.   Patient's Condition:  Patient is calm and quiet.  Patient's Conversation:  Patient is in his room sleeping.  Mickie Baillizabeth O Iwenekha 05/26/2015, 6:58 PM

## 2015-05-26 NOTE — Progress Notes (Signed)
[facility] Post 1:1 Observation Documentation  For the first (8) hours following discontinuation of 1:1 precautions, a progress note entry by nursing staff should be documented at least every 2 hours, reflecting the patient's behavior, condition, mood, and conversation.  Use the progress notes for additional entries.  Time 1:1 discontinued:  1015AM  Patient's Behavior:  Patient is calm and in his room sleeping.  Patient's Condition: patient is stable   Patient's Conversation:  Patient interacting with staff and peers.  Mickie Baillizabeth O Iwenekha 05/26/2015, 6:53 PM

## 2015-05-26 NOTE — Progress Notes (Signed)
Nursing 1:1 note D:Pt observed sleeping in bed with eyes closed. RR even and unlabored. No distress noted. A: 1:1 observation continues for safety  R: pt remains safe  

## 2015-05-26 NOTE — Progress Notes (Signed)
1:1 Note: Patient maintained on constant supervision for safety.  Patient is calm and pleasant.  Denies SI, auditory and visual hallucination.  Medications given as prescribed.  Support and encouragement offered as needed.  Patient in the dayroom watching TV and socializing with peers.

## 2015-05-27 LAB — GLUCOSE, CAPILLARY
GLUCOSE-CAPILLARY: 252 mg/dL — AB (ref 65–99)
GLUCOSE-CAPILLARY: 361 mg/dL — AB (ref 65–99)
Glucose-Capillary: 356 mg/dL — ABNORMAL HIGH (ref 65–99)

## 2015-05-27 LAB — PROLACTIN: Prolactin: 21 ng/mL — ABNORMAL HIGH (ref 4.0–15.2)

## 2015-05-27 MED ORDER — LIDOCAINE 5 % EX PTCH
2.0000 | MEDICATED_PATCH | CUTANEOUS | Status: DC
  Administered 2015-05-27: 2 via TRANSDERMAL
  Filled 2015-05-27 (×8): qty 2

## 2015-05-27 MED ORDER — QUETIAPINE FUMARATE 100 MG PO TABS
100.0000 mg | ORAL_TABLET | Freq: Every day | ORAL | Status: DC
  Administered 2015-05-27 – 2015-05-30 (×4): 100 mg via ORAL
  Filled 2015-05-27 (×7): qty 1

## 2015-05-27 MED ORDER — CLONIDINE HCL 0.1 MG PO TABS
ORAL_TABLET | ORAL | Status: AC
  Filled 2015-05-27: qty 1

## 2015-05-27 MED ORDER — AMLODIPINE BESYLATE 10 MG PO TABS
10.0000 mg | ORAL_TABLET | Freq: Every day | ORAL | Status: DC
  Administered 2015-05-28 – 2015-06-01 (×5): 10 mg via ORAL
  Filled 2015-05-27 (×7): qty 1

## 2015-05-27 MED ORDER — CLONIDINE HCL 0.1 MG PO TABS
0.1000 mg | ORAL_TABLET | Freq: Two times a day (BID) | ORAL | Status: DC
  Administered 2015-05-27 – 2015-05-31 (×10): 0.1 mg via ORAL
  Filled 2015-05-27 (×13): qty 1

## 2015-05-27 MED ORDER — INSULIN ASPART 100 UNIT/ML ~~LOC~~ SOLN
0.0000 [IU] | Freq: Three times a day (TID) | SUBCUTANEOUS | Status: DC
  Administered 2015-05-27: 15 [IU] via SUBCUTANEOUS
  Administered 2015-05-28 (×2): 8 [IU] via SUBCUTANEOUS
  Administered 2015-05-28: 3 [IU] via SUBCUTANEOUS
  Administered 2015-05-29: 8 [IU] via SUBCUTANEOUS
  Administered 2015-05-29 – 2015-05-30 (×4): 5 [IU] via SUBCUTANEOUS
  Administered 2015-05-30: 11 [IU] via SUBCUTANEOUS
  Administered 2015-05-31: 8 [IU] via SUBCUTANEOUS
  Administered 2015-05-31: 5 [IU] via SUBCUTANEOUS
  Administered 2015-06-01: 8 [IU] via SUBCUTANEOUS
  Administered 2015-06-01: 3 [IU] via SUBCUTANEOUS

## 2015-05-27 MED ORDER — ZOLPIDEM TARTRATE 5 MG PO TABS
5.0000 mg | ORAL_TABLET | Freq: Every evening | ORAL | Status: DC | PRN
  Administered 2015-05-27 – 2015-05-30 (×4): 5 mg via ORAL
  Filled 2015-05-27 (×4): qty 1

## 2015-05-27 NOTE — Progress Notes (Signed)
[facility] MD Progress Note  05/27/2015 2:59 PM Arman Loy  MRN:  [id number] Subjective:  Pt states " I am in pain, I want xanax , that is the only thing that will help me.'     Objective: Pt is a 60 Y/O caucasian male who lives in [place] , has a hx of panic attacks , depressions, opioid use disorder, BZD abuse and cannabis abuse, who presented with thoughts of self harm.  Patient seen and chart reviewed.Discussed patient with treatment team.  Pt today appears anxious , vaguely irritable , demanding xanax . Pt states he wants to get help with his spasms and only xanax will help him. Pt became more irritable when writer discussed other medication options. Per staff pt continues to need encouragement and support.     Principal Problem: MDD (major depressive disorder), recurrent episode, severe (HCC) Diagnosis:   Patient Active Problem List   Diagnosis Date Noted  . Opioid use disorder, severe, in sustained remission [F11.90] 05/25/2015  . Benzodiazepine abuse [F13.10] 05/25/2015  . Cannabis use disorder, moderate, dependence (HCC) [F12.20] 05/25/2015  . MDD (major depressive disorder), recurrent episode, severe (HCC) [F33.2] 05/23/2015  . Chronic pain syndrome [G89.4] 05/23/2015  . GAD (generalized anxiety disorder) [F41.1] 05/23/2015  . CVA (cerebral infarction) [I63.9] 10/15/2013  . Hypertension [I10] 10/15/2013  . Diabetes mellitus (HCC) [E11.9] 10/15/2013   Total Time spent with patient: 25 minutes  Past Psychiatric History: see admission H and P  Past Medical History:  Past Medical History  Diagnosis Date  . Stroke (HCC)   . Diabetes mellitus without complication (HCC)   . Hypertension   . Depression    History reviewed. No pertinent past surgical history. Family History:Pt reports he does not know details about family hx . Family Psychiatric  History: Patient's father had mental illness, he shot self and patient found him . Pt's grandfather also had mental illness  and killed self. Social History: Please see H&p. History  Alcohol Use No     History  Drug Use  . Yes  . Special: Marijuana, Cocaine    Social History   Social History  . Marital Status: Divorced    Spouse Name: N/A  . Number of Children: N/A  . Years of Education: N/A   Social History Main Topics  . Smoking status: Current Every Day Smoker -- 1.00 packs/day    Types: Cigarettes  . Smokeless tobacco: None  . Alcohol Use: No  . Drug Use: Yes    Special: Marijuana, Cocaine  . Sexual Activity: Not Asked     Comment: week ago   Other Topics Concern  . None   Social History Narrative   Additional Social History:    Pain Medications: See PTA Prescriptions: See PTA Over the Counter: See PTA History of alcohol / drug use?: Yes Negative Consequences of Use: Financial, Personal relationships Withdrawal Symptoms: Agitation, Irritability Name of Substance 1: THC 1 - Age of First Use: 17 1 - Amount (size/oz): 1 gram per day 1 - Frequency: "whenever i can afford it" 1 - Duration: ongoing 1 - Last Use / Amount: "2 joints" yesterday Name of Substance 2: opiates 2 - Age of First Use: 58 2 - Amount (size/oz): $20 bag (heroin) 2 - Frequency: "very selfom" 2 - Duration: ongoing 2 - Last Use / Amount: 2 weeks ago                Sleep: Fair  Appetite:  Fair  Current Medications: Current Facility-Administered  Medications  Medication Dose Route Frequency Provider Last Rate Last Dose  . acetaminophen (TYLENOL) tablet 650 mg  650 mg Oral Q4H PRN Earney NavyJosephine C Onuoha, NP   650 mg at 05/25/15 1646  . alum & mag hydroxide-simeth (MAALOX/MYLANTA) 200-200-20 MG/5ML suspension 30 mL  30 mL Oral PRN Earney NavyJosephine C Onuoha, NP      . Melene Muller[START ON 05/28/2015] amLODipine (NORVASC) tablet 10 mg  10 mg Oral Daily Jaishon Krisher, MD      . carbamazepine (TEGRETOL XR) 12 hr tablet 200 mg  200 mg Oral BID Rachael FeeIrving A Lugo, MD   200 mg at 05/27/15 0803  . cloNIDine (CATAPRES) tablet 0.1 mg  0.1 mg  Oral BID Jomarie LongsSaramma Giovana Faciane, MD   0.1 mg at 05/27/15 1000  . DULoxetine (CYMBALTA) DR capsule 30 mg  30 mg Oral Daily Jomarie LongsSaramma Abigale Dorow, MD   30 mg at 05/27/15 0803  . fluvoxaMINE (LUVOX) tablet 50 mg  50 mg Oral QHS Jomarie LongsSaramma Paulyne Mooty, MD   50 mg at 05/26/15 2135  . gabapentin (NEURONTIN) capsule 800 mg  800 mg Oral QID Rachael FeeIrving A Lugo, MD   800 mg at 05/27/15 1128  . ibuprofen (ADVIL,MOTRIN) tablet 600 mg  600 mg Oral Q8H PRN Earney NavyJosephine C Onuoha, NP      . insulin aspart (novoLOG) injection 0-15 Units  0-15 Units Subcutaneous TID WC Katelin Kutsch, MD      . LORazepam (ATIVAN) tablet 1 mg  1 mg Oral Q8H PRN Earney NavyJosephine C Onuoha, NP   1 mg at 05/27/15 0805  . metFORMIN (GLUCOPHAGE) tablet 500 mg  500 mg Oral BID WC Earney NavyJosephine C Onuoha, NP   500 mg at 05/27/15 0803  . methocarbamol (ROBAXIN) tablet 500 mg  500 mg Oral TID Jomarie LongsSaramma Ioma Chismar, MD   500 mg at 05/27/15 1127  . nicotine (NICODERM CQ - dosed in mg/24 hours) patch 21 mg  21 mg Transdermal Daily Rachael FeeIrving A Lugo, MD   21 mg at 05/27/15 0805  . ondansetron (ZOFRAN) tablet 4 mg  4 mg Oral Q8H PRN Earney NavyJosephine C Onuoha, NP      . pneumococcal 23 valent vaccine (PNU-IMMUNE) injection 0.5 mL  0.5 mL Intramuscular Tomorrow-1000 Rachael FeeIrving A Lugo, MD   0.5 mL at 05/24/15 1043  . QUEtiapine (SEROQUEL) tablet 100 mg  100 mg Oral QHS Mithcell Schumpert, MD      . QUEtiapine (SEROQUEL) tablet 50 mg  50 mg Oral TID PRN Jomarie LongsSaramma Simona Rocque, MD   50 mg at 05/27/15 1128  . simvastatin (ZOCOR) tablet 20 mg  20 mg Oral q1800 Jomarie LongsSaramma Tyjay Galindo, MD   20 mg at 05/26/15 1919  . zolpidem (AMBIEN) tablet 5 mg  5 mg Oral QHS PRN Jomarie LongsSaramma Kealy Lewter, MD        Lab Results:  Results for orders placed or performed during the hospital encounter of 05/23/15 (from the past 48 hour(s))  Prolactin     Status: Abnormal   Collection Time: 05/26/15  6:43 AM  Result Value Ref Range   Prolactin 21.0 (H) 4.0 - 15.2 ng/mL    Comment: (NOTE) Performed At: [facility]  [address] [place], [place]  [id number] Mila HomerHancock William F MD WU:[id number]:[phone] Performed at [facility]   TSH     Status: None   Collection Time: 05/26/15  6:43 AM  Result Value Ref Range   TSH 1.887 0.350 - 4.500 uIU/mL    Comment: Performed at [facility]  Glucose, capillary     Status:  Abnormal   Collection Time: 05/27/15  5:39 AM  Result Value Ref Range   Glucose-Capillary 252 (H) 65 - 99 mg/dL   Comment 1 Notify RN   Glucose, capillary     Status: Abnormal   Collection Time: 05/27/15  2:13 PM  Result Value Ref Range   Glucose-Capillary 361 (H) 65 - 99 mg/dL    Blood Alcohol level:  Lab Results  Component Value Date   ETH <5 05/22/2015   ETH <11 10/15/2013    Physical Findings: AIMS: Facial and Oral Movements Muscles of Facial Expression: None, normal Lips and Perioral Area: None, normal Jaw: None, normal Tongue: None, normal,Extremity Movements Upper (arms, wrists, hands, fingers): None, normal Lower (legs, knees, ankles, toes): None, normal, Trunk Movements Neck, shoulders, hips: None, normal, Overall Severity Severity of abnormal movements (highest score from questions above): None, normal Incapacitation due to abnormal movements: None, normal Patient's awareness of abnormal movements (rate only patient's report): No Awareness, Dental Status Current problems with teeth and/or dentures?: Yes Does patient usually wear dentures?: No  CIWA:  CIWA-Ar Total: 5 COWS:  COWS Total Score: 2  Musculoskeletal: Strength & Muscle Tone: within normal limits Gait & Station: normal Patient leans: normal  Psychiatric Specialty Exam: Review of Systems  Constitutional: Positive for malaise/fatigue.  HENT: Negative.   Eyes: Negative.   Cardiovascular: Negative.   Gastrointestinal: Negative.   Genitourinary: Negative.   Musculoskeletal: Positive for back pain.  Skin: Negative.   Psychiatric/Behavioral: Positive for depression. Suicidal ideas: self injurious behavior on  admission, denies today. The patient is nervous/anxious.   All other systems reviewed and are negative.   Blood pressure 121/71, pulse 109, temperature 98.5 F (36.9 C), temperature source Oral, resp. rate 16, height 5\' 11"  (1.803 m), weight 102.967 kg (227 lb), SpO2 99 %.Body mass index is 31.67 kg/(m^2).  General Appearance: Disheveled  Eye Contact::  Minimal  Speech:  Clear and Coherent  Volume:  Normal  Mood:  Anxious, Hopeless and Irritable  Affect:  Labile  Thought Process:  Linear  Orientation:  Full (Time, Place, and Person)  Thought Content:  Rumination  Suicidal Thoughts:  No says he will go out and kill self because he cannot get his medications straight  Homicidal Thoughts:  No  Memory:  Immediate;   Fair Recent;   Fair Remote;   Fair  Judgement:  Poor  Insight:  Shallow  Psychomotor Activity:  Restlessness  Concentration:  Poor  Recall:  [employer] of Knowledge:Fair  Language: Fair  Akathisia:  No  Handed:  Right  AIMS (if indicated):     Assets:  Desire for Improvement  ADL's:  Intact  Cognition: WNL  Sleep:  Number of Hours: 5.75   Treatment Plan Summary:Pt is a 60 Y/O caucasian male who lives in [place] , has a hx of panic attacks , depressions, opioid use disorder, BZD abuse and cannabis abuse, who presented with thoughts of self harm.Pt today continues to be irritable,  Anxious . Will continue treatment.  Daily contact with patient to assess and evaluate symptoms and progress in treatment and Medication management Reviewed past medical records,treatment plan. Will discontinue 1:1 precaution for safety.  Will continue Tegretol xr 200 mg po bid for mood lability. Tegretol level in 3 days. Will increase seroquel to 100 mg po qhs for sleep/mood sx as well as seroquel 50 mg po tid prn for severe anxiety/agitation. Will reduce Ambien to 5 mg po qhs prn for sleep. Will DC once pt is able  to tolerate seroquel. Will reduce Cymbalta to 30 mg po daily - will continue  this dose since it helps with pain. Will continue Luvox to 50 mg po qhs for affective sx, titrate higher as needed. Will discontinue Trazodone 200 mg po qhs prn - risk of serotonic syndrome on multiple medications. Will continue Gabapentin 800 mg po qid for anxiety/pain. Will continue to monitor vitals ,medication compliance and treatment side effects while patient is here.  Will monitor for medical issues as well as call consult as needed. Restart home medications where indicated. Reviewed labs - added Zocor 20 mg po qpm for abnormal lipid panel, hba1c- 9.4, ekg - wnl . Diabetic management - by SSI /Metformin scheduled. Will get Diabetic consult as needed. CSW will continue working on disposition.  Patient to participate in therapeutic milieu .       Kaysa Roulhac, MD 05/27/2015, 2:59 PM

## 2015-05-27 NOTE — [facility] Group Notes (Signed)
Adult Psychoeducational Group Note  Date:  05/27/2015 Time:  9:04 PM  Group Topic/Focus:  Wrap-Up Group:   The focus of this group is to help patients review their daily goal of treatment and discuss progress on daily workbooks.  Participation Level:  Did Not Attend  Participation Quality:  None  Affect:  None  Cognitive:  None  Insight: None  Engagement in Group:  None  Modes of Intervention:  Discussion  Additional Comments:  Pt did not attend group.  Caroll RancherLindsay, Valeri Sula A 05/27/2015, 9:04 PM

## 2015-05-27 NOTE — Progress Notes (Addendum)
D: Pt irritable and agitated on approach. Pt was cursing and talking about how he dislike the doctor. Pt reported having racing thoughts. Pt stated that he feels like a nervous wreck and requested prn Ativan. Pt stated that the Ativan was not effective and he continues to feel anxious. Pt denies AVH. Pt denies SI. Pt c/o left leg pain. Meds given as ordered for pain. Pt blood pressure elevated this morning. Dr. Elna BreslowEappen made aware. Pt given clonidine as ordered per MD. Ranae PlumberWriter will reassess b/p. A: Medications administered as ordered per MD. Verbal support provided. Pt encouraged to attend groups as tolerable. 15 minute checks performed for safety.  R: Pt safety maintained at this time.   Pt started on sliding scale per Dr. Elna BreslowEappen. Verbal order given to writer to start sliding scale. Order placed by Clinical research associatewriter. Pt cbg checked at 2:21 pm and result 361. Dr. Elna BreslowEappen made aware. Will continue to monitor pt.

## 2015-05-27 NOTE — Progress Notes (Signed)
Adult Psychoeducational Group Note  Date:  05/27/2015 Time:  0930   Group Topic/Focus:  self inventory   Participation Level:  Did Not Attend  Participation Quality:    Affect:    Cognitive:    Insight:   Engagement in Group:    Modes of Intervention:    Additional Comments:    [name] 05/27/2015, 1:42 PM

## 2015-05-27 NOTE — Progress Notes (Signed)
D: Pt presents as anxious in affect and mood. Pt actively participates within the milieu. Pt negative for any SI/HI/AVH. Pt had no concerns he wished for this writer to address. Pt is compliant with his current POC.    A: Pt received scheduled and prn medications, per MD orders. Continued support and availability as needed was extended to this pt. Staff continues to monitor pt with q115min checks.  R: No adverse drug reactions noted. Pt receptive to treatment. Pt remains safe at this time.

## 2015-05-27 NOTE — [facility] Group Notes (Signed)
[facility] Group Notes: (Clinical Social Work)   05/27/2015      Type of Therapy:  Group Therapy   Participation Level:  Did Not Attend despite MHT prompting   Desta Bujak Grossman-Orr, LCSW 05/27/2015, 12:38 PM     

## 2015-05-27 NOTE — Progress Notes (Signed)
[facility] Group Notes:  (Nursing/MHT/Case Management/Adjunct)  Date:  05/27/2015  Time:  12:08 AM  Type of Therapy:  Psychoeducational Skills  Participation Level:  Active  Participation Quality:  Attentive  Affect:  Appropriate  Cognitive:  Appropriate  Insight:  Improving  Engagement in Group:  Developing/Improving  Modes of Intervention:  Education  Summary of Progress/Problems: The patient's coping skill following discharge (theme of the day) will be to stay away from certain people in his life who are a negative influence.   [name] S 05/27/2015, 12:08 AM

## 2015-05-28 LAB — GLUCOSE, CAPILLARY
Glucose-Capillary: 184 mg/dL — ABNORMAL HIGH (ref 65–99)
Glucose-Capillary: 284 mg/dL — ABNORMAL HIGH (ref 65–99)
Glucose-Capillary: 278 mg/dL — ABNORMAL HIGH (ref 65–99)

## 2015-05-28 LAB — CARBAMAZEPINE LEVEL, TOTAL: Carbamazepine Lvl: 8.9 ug/mL (ref 4.0–12.0)

## 2015-05-28 NOTE — Progress Notes (Signed)
Pt complained of back, pt given motrin 600 mg po at 1652, scheduled robaxin 500 mg po given at 1718. Pt refused ladocaine patch, pt offered hot pac, he refused. Pt stated he wanted oxycodone 10 mg and if not given he was gonna go off. Pt is being loud and verbally aggressive.

## 2015-05-28 NOTE — Progress Notes (Signed)
Pt attend group to get snacks but not complying with plan of care.

## 2015-05-28 NOTE — Progress Notes (Signed)
DAR NOTE: Pt present with flat affect and depressed mood in the unit. Pt has been isolating himself most of the day. Pt denies physical pain, took all his meds as scheduled. As per self inventory, pt had a poor night sleep, fair appetite, low energy, and poor concentration. Pt rate depression at 8, hopeless ness at 10, and anxiety at a 10. Pt's safety ensured with 15 minute and environmental checks. Pt currently denies SI/HI and A/V hallucinations. Pt verbally agrees to seek staff if SI/HI or A/VH occurs and to consult with staff before acting on these thoughts. Will continue POC.

## 2015-05-28 NOTE — [facility] Group Notes (Signed)
[facility] LCSW Group Therapy  05/28/2015 1:15 pm  Type of Therapy: Process Group Therapy  Participation Level:  Active  Participation Quality:  Appropriate  Affect:  Flat  Cognitive:  Oriented  Insight:  Improving  Engagement in Group:  Limited  Engagement in Therapy:  Limited  Modes of Intervention:  Activity, Clarification, Education, Problem-solving and Support  Summary of Progress/Problems: Today's group addressed the issue of overcoming obstacles.  Patients were asked to identify their biggest obstacle post d/c that stands in the way of their on-going success, and then problem solve as to how to manage this. Invited.  Chose to not attend  Ida Rogueorth, Tylesha Gibeault B 05/28/2015   3:14 PM

## 2015-05-28 NOTE — Progress Notes (Signed)
Pt had been up and visible but did not attend evening group activity. Pt has been irritable this evening and spoke that he did not have a good day. Pt muttered some about how he feels that he is not being helped and also spoke about how he has been having difficulties with sleep. Pt was able to receive all bedtime medications without incident including ambien which he requested for sleep.

## 2015-05-28 NOTE — Progress Notes (Signed)
[facility] MD Progress Note  05/28/2015 2:42 PM Oluwatimilehin Balfour  MRN:  [id number] Subjective:  Patient reports "I am not doing good, I am extremely anxious"   Objective: Nazir Hacker is awake, alert  , found sitting on the side of the bed. Denies suicidal or homicidal ideation. Denies auditory or visual hallucination and does not appear to be responding to internal stimuli.  Patient reports increased anxiety. Patient reports that "my life keeps pilling out on me"  I think my ACT team is not helpful. Patient reports he doesn't like to be around people.  Report his  depression 4/10.  States his anxiety is 8/10 today. Patient states "I am not feeling like myself today."  Reports good appetite other and states that his is not resting well due to back pain. Support, encouragement and reassurance was provided.   Principal Problem: MDD (major depressive disorder), recurrent episode, severe (HCC) Diagnosis:   Patient Active Problem List   Diagnosis Date Noted  . Opioid use disorder, severe, in sustained remission [F11.90] 05/25/2015  . Benzodiazepine abuse [F13.10] 05/25/2015  . Cannabis use disorder, moderate, dependence (HCC) [F12.20] 05/25/2015  . MDD (major depressive disorder), recurrent episode, severe (HCC) [F33.2] 05/23/2015  . Chronic pain syndrome [G89.4] 05/23/2015  . GAD (generalized anxiety disorder) [F41.1] 05/23/2015  . CVA (cerebral infarction) [I63.9] 10/15/2013  . Hypertension [I10] 10/15/2013  . Diabetes mellitus (HCC) [E11.9] 10/15/2013   Total Time spent with patient: 25 minutes  Past Psychiatric History: see admission H and P  Past Medical History:  Past Medical History  Diagnosis Date  . Stroke (HCC)   . Diabetes mellitus without complication (HCC)   . Hypertension   . Depression    History reviewed. No pertinent past surgical history. Family History:Pt reports he does not know details about family hx . Family Psychiatric  History: Patient's father had mental  illness, he shot self and patient found him . Pt's grandfather also had mental illness and killed self. Social History: Please see H&p. History  Alcohol Use No     History  Drug Use  . Yes  . Special: Marijuana, Cocaine    Social History   Social History  . Marital Status: Divorced    Spouse Name: N/A  . Number of Children: N/A  . Years of Education: N/A   Social History Main Topics  . Smoking status: Current Every Day Smoker -- 1.00 packs/day    Types: Cigarettes  . Smokeless tobacco: None  . Alcohol Use: No  . Drug Use: Yes    Special: Marijuana, Cocaine  . Sexual Activity: Not Asked     Comment: week ago   Other Topics Concern  . None   Social History Narrative   Additional Social History:    Pain Medications: See PTA Prescriptions: See PTA Over the Counter: See PTA History of alcohol / drug use?: Yes Negative Consequences of Use: Financial, Personal relationships Withdrawal Symptoms: Agitation, Irritability Name of Substance 1: THC 1 - Age of First Use: 17 1 - Amount (size/oz): 1 gram per day 1 - Frequency: "whenever i can afford it" 1 - Duration: ongoing 1 - Last Use / Amount: "2 joints" yesterday Name of Substance 2: opiates 2 - Age of First Use: 58 2 - Amount (size/oz): $20 bag (heroin) 2 - Frequency: "very selfom" 2 - Duration: ongoing 2 - Last Use / Amount: 2 weeks ago                Sleep: Fair  Appetite:  Fair  Current Medications: Current Facility-Administered Medications  Medication Dose Route Frequency Provider Last Rate Last Dose  . acetaminophen (TYLENOL) tablet 650 mg  650 mg Oral Q4H PRN Earney NavyJosephine C Onuoha, NP   650 mg at 05/25/15 1646  . alum & mag hydroxide-simeth (MAALOX/MYLANTA) 200-200-20 MG/5ML suspension 30 mL  30 mL Oral PRN Earney NavyJosephine C Onuoha, NP      . amLODipine (NORVASC) tablet 10 mg  10 mg Oral Daily Jomarie LongsSaramma Eappen, MD   10 mg at 05/28/15 0810  . carbamazepine (TEGRETOL XR) 12 hr tablet 200 mg  200 mg Oral BID  Rachael FeeIrving A Shirleyann Montero, MD   200 mg at 05/28/15 [id number]  . cloNIDine (CATAPRES) tablet 0.1 mg  0.1 mg Oral BID Jomarie LongsSaramma Eappen, MD   0.1 mg at 05/28/15 0811  . DULoxetine (CYMBALTA) DR capsule 30 mg  30 mg Oral Daily Jomarie LongsSaramma Eappen, MD   30 mg at 05/28/15 0812  . fluvoxaMINE (LUVOX) tablet 50 mg  50 mg Oral QHS Jomarie LongsSaramma Eappen, MD   50 mg at 05/27/15 2124  . gabapentin (NEURONTIN) capsule 800 mg  800 mg Oral QID Rachael FeeIrving A Tanza Pellot, MD   800 mg at 05/28/15 1145  . ibuprofen (ADVIL,MOTRIN) tablet 600 mg  600 mg Oral Q8H PRN Earney NavyJosephine C Onuoha, NP      . insulin aspart (novoLOG) injection 0-15 Units  0-15 Units Subcutaneous TID WC Jomarie LongsSaramma Eappen, MD   8 Units at 05/28/15 1146  . lidocaine (LIDODERM) 5 % 2 patch  2 patch Transdermal Q24H Jomarie LongsSaramma Eappen, MD   2 patch at 05/27/15 1647  . LORazepam (ATIVAN) tablet 1 mg  1 mg Oral Q8H PRN Earney NavyJosephine C Onuoha, NP   1 mg at 05/28/15 1144  . metFORMIN (GLUCOPHAGE) tablet 500 mg  500 mg Oral BID WC Earney NavyJosephine C Onuoha, NP   500 mg at 05/28/15 0810  . methocarbamol (ROBAXIN) tablet 500 mg  500 mg Oral TID Jomarie LongsSaramma Eappen, MD   500 mg at 05/28/15 1145  . nicotine (NICODERM CQ - dosed in mg/24 hours) patch 21 mg  21 mg Transdermal Daily Rachael FeeIrving A Kailany Dinunzio, MD   21 mg at 05/28/15 0815  . ondansetron (ZOFRAN) tablet 4 mg  4 mg Oral Q8H PRN Earney NavyJosephine C Onuoha, NP      . pneumococcal 23 valent vaccine (PNU-IMMUNE) injection 0.5 mL  0.5 mL Intramuscular Tomorrow-1000 Rachael FeeIrving A Mike Hamre, MD   0.5 mL at 05/24/15 1043  . QUEtiapine (SEROQUEL) tablet 100 mg  100 mg Oral QHS Jomarie LongsSaramma Eappen, MD   100 mg at 05/27/15 2122  . QUEtiapine (SEROQUEL) tablet 50 mg  50 mg Oral TID PRN Jomarie LongsSaramma Eappen, MD   50 mg at 05/27/15 1128  . simvastatin (ZOCOR) tablet 20 mg  20 mg Oral q1800 Saramma Eappen, MD   20 mg at 05/27/15 1647  . zolpidem (AMBIEN) tablet 5 mg  5 mg Oral QHS PRN Jomarie LongsSaramma Eappen, MD   5 mg at 05/27/15 2124    Lab Results:  Results for orders placed or performed during the hospital encounter of  05/23/15 (from the past 48 hour(s))  Glucose, capillary     Status: Abnormal   Collection Time: 05/27/15  5:39 AM  Result Value Ref Range   Glucose-Capillary 252 (H) 65 - 99 mg/dL   Comment 1 Notify RN   Glucose, capillary     Status: Abnormal   Collection Time: 05/27/15  2:13 PM  Result Value Ref Range   Glucose-Capillary 361 (H)  65 - 99 mg/dL  Glucose, capillary     Status: Abnormal   Collection Time: 05/27/15  4:39 PM  Result Value Ref Range   Glucose-Capillary 356 (H) 65 - 99 mg/dL  Glucose, capillary     Status: Abnormal   Collection Time: 05/28/15  5:46 AM  Result Value Ref Range   Glucose-Capillary 184 (H) 65 - 99 mg/dL  Carbamazepine level, total     Status: None   Collection Time: 05/28/15  6:14 AM  Result Value Ref Range   Carbamazepine Lvl 8.9 4.0 - 12.0 ug/mL    Comment: Performed at [facility]  Glucose, capillary     Status: Abnormal   Collection Time: 05/28/15 11:45 AM  Result Value Ref Range   Glucose-Capillary 284 (H) 65 - 99 mg/dL   Comment 1 Notify RN    Comment 2 Document in Chart     Blood Alcohol level:  Lab Results  Component Value Date   ETH <5 05/22/2015   ETH <11 10/15/2013    Physical Findings: AIMS: Facial and Oral Movements Muscles of Facial Expression: None, normal Lips and Perioral Area: None, normal Jaw: None, normal Tongue: None, normal,Extremity Movements Upper (arms, wrists, hands, fingers): None, normal Lower (legs, knees, ankles, toes): None, normal, Trunk Movements Neck, shoulders, hips: None, normal, Overall Severity Severity of abnormal movements (highest score from questions above): None, normal Incapacitation due to abnormal movements: None, normal Patient's awareness of abnormal movements (rate only patient's report): No Awareness, Dental Status Current problems with teeth and/or dentures?: Yes Does patient usually wear dentures?: No  CIWA:  CIWA-Ar Total: 5 COWS:  COWS Total Score: 2  Musculoskeletal: Strength  & Muscle Tone: within normal limits Gait & Station: normal Patient leans: normal  Psychiatric Specialty Exam: Review of Systems  HENT: Negative.   Eyes: Negative.   Cardiovascular: Negative.   Gastrointestinal: Negative.   Genitourinary: Negative.   Musculoskeletal: Positive for back pain.  Skin: Negative.   Psychiatric/Behavioral: Positive for depression. Suicidal ideas: self injurious behavior on admission, denies today. The patient is nervous/anxious.   All other systems reviewed and are negative.   Blood pressure 144/82, pulse 102, temperature 98.9 F (37.2 C), temperature source Oral, resp. rate 20, height  (1.803 m), weight 102.967 kg (227 lb), SpO2 99 %.Body mass index is 31.67 kg/(m^2).  General Appearance: Disheveled and Guarded  Eye Contact::  Minimal  Speech:  Clear and Coherent  Volume:  Normal  Mood:  Anxious, Hopeless and Irritable  Affect:  Labile  Thought Process:  Linear  Orientation:  Full (Time, Place, and Person)  Thought Content:  Rumination  Suicidal Thoughts:  No   Homicidal Thoughts:  No  Memory:  Immediate;   Fair Recent;   Fair Remote;   Fair  Judgement:  Poor  Insight:  Shallow  Psychomotor Activity:  Restlessness  Concentration:  Poor  Recall:  [employer] of Knowledge:Fair  Language: Fair  Akathisia:  No  Handed:  Right  AIMS (if indicated):     Assets:  Desire for Improvement  ADL's:  Intact  Cognition: WNL  Sleep:  Number of Hours: 6.25    I agree with current treatment plan on [id number], Patient seen face-to-face for psychiatric evaluation follow-up, chart reviewed. Reviewed the information documented and agree with the treatment plan.  Treatment and Plan: Daily contact with patient to assess and evaluate symptoms and progress in treatment and Medication management   Reviewed past medical records,treatment plan. Will discontinue 1:1 precaution  for safety.  Continue Tegretol xr 200 mg po bid for mood lability. Tegretol level in  3 days. Contine Seroquel to 100 mg po qhs for sleep/mood sx as well as seroquel 50 mg po tid prn for severe anxiety/agitation. Continue Ambien to 5 mg po qhs prn for sleep. Will DC once pt is able to tolerate seroquel. Contiune Cymbalta to 30 mg po daily - will continue this dose since it helps with pain. Will continue Luvox to 50 mg po qhs for affective sx, titrate higher as needed. Will discontinue Trazodone 200 mg po qhs prn - risk of serotonic syndrome on multiple medications. Continue Gabapentin 800 mg po qid for anxiety/pain. Continue to monitor vitals ,medication compliance and treatment side effects while patient is here.  Will monitor for medical issues as well as call consult as needed. Restart home medications where indicated.  MD noted:Reviewed labs - added Zocor 20 mg po qpm for abnormal lipid panel, hba1c- 9.4, ekg - wnl . Diabetic management - by [facility] /Metformin scheduled. Will get Diabetic consult as needed. CSW will continue working on disposition.  Patient to participate in therapeutic milieu   Oneta Rack, NP 05/28/2015, 2:42 PM  I agree with assessment and plan Madie Reno A. Dub Mikes, M.D.

## 2015-05-29 LAB — GLUCOSE, CAPILLARY
GLUCOSE-CAPILLARY: 213 mg/dL — AB (ref 65–99)
Glucose-Capillary: 219 mg/dL — ABNORMAL HIGH (ref 65–99)
Glucose-Capillary: 273 mg/dL — ABNORMAL HIGH (ref 65–99)

## 2015-05-29 MED ORDER — FLUVOXAMINE MALEATE 50 MG PO TABS
25.0000 mg | ORAL_TABLET | Freq: Every day | ORAL | Status: DC
  Administered 2015-05-30 – 2015-06-01 (×3): 25 mg via ORAL
  Filled 2015-05-29 (×5): qty 1

## 2015-05-29 NOTE — Progress Notes (Signed)
Adult Psychoeducational Group Note  Date:  05/29/2015 Time:  9:07 PM  Group Topic/Focus:  Wrap-Up Group:   The focus of this group is to help patients review their daily goal of treatment and discuss progress on daily workbooks.  Participation Level:  Minimal  Participation Quality:  Appropriate  Affect:  Appropriate  Cognitive:  Alert  Insight: Limited  Engagement in Group:  Limited  Modes of Intervention:  Discussion  Additional Comments:  Patient was very limited. Patient stated "I did not have a goal for today. I am taking it day by day". On a scale between 1-10, (1=worse, 10=best) patient rated his day a 2.  [name] 05/29/2015, 9:07 PM

## 2015-05-29 NOTE — Tx Team (Signed)
Interdisciplinary Treatment Plan Update (Adult)  Date:  05/29/2015  Time Reviewed:  8:23 AM   Progress in Treatment: Attending groups: No. New to unit. Continuing to assess.  Participating in groups:  No. Taking medication as prescribed:  Yes. Tolerating medication:  Yes. Family/Significant othe contact made:  SPE required for this pt.  Patient understands diagnosis:  Yes, aeb asking for help with anxiety, pain. Discussing patient identified problems/goals with staff:  Yes. Medical problems stabilized or resolved:  Yes. Denies suicidal/homicidal ideation: No, pt continues to endorse passive SI/able to contract for safety on the unit.  Issues/concerns per patient self-inventory:  Other:  Discharge Plan or Barriers: See below Reason for Continuation of Hospitalization: Depression Medication stabilization Suicidal ideation Withdrawal symptoms  Comments:  [name] is an 60 y.o. male presenting to [facility] voluntarily for suicidal ideations with a plan to "overdose on whatever i can get my hands on that will kill me." Patient reports that his most recent stressor is pain and endorses symptoms of depression. Patient states that he has had "several" suicide attempts in the past and the last was "sometime last year" when he attempted to "overdose on drugs" and was treated in [place]. Patient denies HI and history of being violent towards others. Patient denies AVH and does not appear to be responding to internal stimuli.  Patient has a strong body odor and is dressed in scrubs. Patient states that he has had a [facility] for "fve or six years" and states that he last spoke with them prior to coming to the ED. Patient states that he does not use alcohol but states that he will use "pot and anything else that will stop the pain up to heroin." Patient states that he has used THC since the age of 89 and uses it "wheverer I can afford it, like seven grams a week." Patient states that he last  use "one or two joints this afternoon." Patient states that he started using opiates one year ago and usually uses a "$20 bag" and last used two weeks ago. Patient UDS + THC and BAL <5 at time of assessment.  Diagnosis: Major Depressive Disorder, Recurrent, Severe  05/29/15: Pt today presents as irritable and anxious. Will continue Tegretol xr 200 mg po bid for mood lability. Tegretol level -8.9 ( 05/28/15) Will continue seroquel 100 mg po qhs for sleep/mood sx as well as seroquel 50 mg po tid prn for severe anxiety/agitation. Will reduce Ambien to 5 mg po qhs prn for sleep. Will DC once pt is able to tolerate seroquel. Will continue Cymbalta 30 mg po daily - will continue this dose since it helps with pain. Will increase Luvox to 25 mg po daily and 50 mg po qhs for affective sx, titrate higher as needed. Will discontinue Trazodone 200 mg po qhs prn - risk of serotonic syndrome on multiple medications. Will continue Gabapentin 800 mg po qid for anxiety/pain.  Estimated length of stay:  4-5 days   New goal(s): Pt c/o more of anxiety; is repeatedly asking for benzos to address this.  Also c/o feeling like his pain management needs are not being met.  Additional Comments:   Review of initial/current patient goals per problem list:  1. Goal(s): Patient will participate in aftercare plan  BWV:ZTRX progressing.   Target date: at discharge  As evidenced by: Patient will participate within aftercare plan AEB aftercare provider and housing plan at discharge being identified.  3/30: Pt reports that he is connected  with [facility] act for the past 5-6 years. CSW assessing for additional referrals/resources that may be helpful to patient.  05/29/15:  Pt has been referred to [facility]  2. Goal (s): Patient will exhibit decreased depressive symptoms and suicidal ideations.  Met: No.    Target date: at discharge  As evidenced by: Patient will utilize self rating of depression at 3 or below and demonstrate  decreased signs of depression or be deemed stable for discharge by MD.  3/30: Pt endorses passive SI/able to contract for safety on the unit. Rates depression as high this morning with depressed mood/lethargic affect.  05/29/15:  Continues to complain of SI with severe depression  3. Goal(s): Patient will demonstrate decreased signs of withdrawal due to substance abuse  Met:Yes  Target date:at discharge   As evidenced by: Patient will produce a CIWA/COWS score of 0, have stable vitals signs, and no symptoms of withdrawal.  3/30: Pt reports mild withdrawals with COWS of 2 and high Sitting/Standing BP. All other vitals are stable.  05/29/15:  No signs nor symptoms of withdrawal today   Attendees: Patient:   05/29/2015 8:23 AM   Family:   05/29/2015 8:23 AM   Physician:  Ursula Alert  05/29/2015 8:23 AM   Nursing:   Patrecia Pace. RN 05/29/2015 8:23 AM   Clinical Social Worker: Ripley Fraise  05/29/2015 8:23 AM   Clinical Social Worker:  05/29/2015 8:23 AM   Other:  Gerline Legacy Nurse Case Manager 05/29/2015 8:23 AM   Other:  Agustina Caroli NP  05/29/2015 8:23 AM   Other:   05/29/2015 8:23 AM   Other:  05/29/2015 8:23 AM   Other:  05/29/2015 8:23 AM   Other:  05/29/2015 8:23 AM    05/29/2015 8:23 AM    05/29/2015 8:23 AM    05/29/2015 8:23 AM    05/29/2015 8:23 AM    Scribe for Treatment Team:   Ripley Fraise LCSW 05/29/2015 8:23 AM

## 2015-05-29 NOTE — Progress Notes (Signed)
D-  Patient has been in the room for the majority of the shift.  Patient has been complaining of pain in his back.  Patient denies SI, HI and AVH but stated "I will save killing myself for when I get out of the hospital." Patient had an agitated outburst and threw hot coffee on the wall and began to yell and curse.  A- Assess the patient for safety, offer medications as prescribed, engage patient in 1:1 staff talks  R-  Continue to monitor as prescribed, patient was able to calm and spent the remainder of the shift resting in his room.  Patient able to contract for safety.

## 2015-05-29 NOTE — Progress Notes (Signed)
[facility] MD Progress Note  05/29/2015 2:49 PM [name]  MRN:  [id number] Subjective:  Pt states " I am anxious , I need something for anxiety sx."     Objective: Pt is a 60 Y/O caucasian male who lives in [place] , has a hx of panic attacks , depressions, opioid use disorder, BZD abuse and cannabis abuse, who presented with thoughts of self harm.  Patient seen and chart reviewed.Discussed patient with treatment team.  Pt today appears anxious , continues to be vaguely irritable , focussed on his pain . Pt with hx of multiple surgeries , was on pain medications before, but was taken off them due to hx of abuse. Discussed this with patient. Per staff pt continues to be irritable , demanding , withdrawn , does not participate in milieu.       Principal Problem: MDD (major depressive disorder), recurrent episode, severe (HCC) Diagnosis:   Patient Active Problem List   Diagnosis Date Noted  . Opioid use disorder, severe, in sustained remission [F11.90] 05/25/2015  . Benzodiazepine abuse [F13.10] 05/25/2015  . Cannabis use disorder, moderate, dependence (HCC) [F12.20] 05/25/2015  . MDD (major depressive disorder), recurrent episode, severe (HCC) [F33.2] 05/23/2015  . Chronic pain syndrome [G89.4] 05/23/2015  . GAD (generalized anxiety disorder) [F41.1] 05/23/2015  . CVA (cerebral infarction) [I63.9] 10/15/2013  . Hypertension [I10] 10/15/2013  . Diabetes mellitus (HCC) [E11.9] 10/15/2013   Total Time spent with patient: 25 minutes  Past Psychiatric History: see admission H and P  Past Medical History:  Past Medical History  Diagnosis Date  . Stroke (HCC)   . Diabetes mellitus without complication (HCC)   . Hypertension   . Depression    History reviewed. No pertinent past surgical history. Family History:Pt reports he does not know details about family hx . Family Psychiatric  History: Patient's father had mental illness, he shot self and patient found him . Pt's  grandfather also had mental illness and killed self. Social History: Please see H&p. History  Alcohol Use No     History  Drug Use  . Yes  . Special: Marijuana, Cocaine    Social History   Social History  . Marital Status: Divorced    Spouse Name: N/A  . Number of Children: N/A  . Years of Education: N/A   Social History Main Topics  . Smoking status: Current Every Day Smoker -- 1.00 packs/day    Types: Cigarettes  . Smokeless tobacco: None  . Alcohol Use: No  . Drug Use: Yes    Special: Marijuana, Cocaine  . Sexual Activity: Not Asked     Comment: week ago   Other Topics Concern  . None   Social History Narrative   Additional Social History:    Pain Medications: See PTA Prescriptions: See PTA Over the Counter: See PTA History of alcohol / drug use?: Yes Negative Consequences of Use: Financial, Personal relationships Withdrawal Symptoms: Agitation, Irritability Name of Substance 1: THC 1 - Age of First Use: 17 1 - Amount (size/oz): 1 gram per day 1 - Frequency: "whenever i can afford it" 1 - Duration: ongoing 1 - Last Use / Amount: "2 joints" yesterday Name of Substance 2: opiates 2 - Age of First Use: 58 2 - Amount (size/oz): $20 bag (heroin) 2 - Frequency: "very selfom" 2 - Duration: ongoing 2 - Last Use / Amount: 2 weeks ago                Sleep: Fair  Appetite:  Fair  Current Medications: Current Facility-Administered Medications  Medication Dose Route Frequency Provider Last Rate Last Dose  . acetaminophen (TYLENOL) tablet 650 mg  650 mg Oral Q4H PRN Earney NavyJosephine C Onuoha, NP   650 mg at 05/25/15 1646  . alum & mag hydroxide-simeth (MAALOX/MYLANTA) 200-200-20 MG/5ML suspension 30 mL  30 mL Oral PRN Earney NavyJosephine C Onuoha, NP      . amLODipine (NORVASC) tablet 10 mg  10 mg Oral Daily Jomarie LongsSaramma Alya Smaltz, MD   10 mg at 05/29/15 0812  . carbamazepine (TEGRETOL XR) 12 hr tablet 200 mg  200 mg Oral BID Rachael FeeIrving A Lugo, MD   200 mg at 05/29/15 0813  .  cloNIDine (CATAPRES) tablet 0.1 mg  0.1 mg Oral BID Jomarie LongsSaramma Coleta Grosshans, MD   0.1 mg at 05/29/15 0813  . DULoxetine (CYMBALTA) DR capsule 30 mg  30 mg Oral Daily Jomarie LongsSaramma Malessa Zartman, MD   30 mg at 05/29/15 0813  . [START ON 05/30/2015] fluvoxaMINE (LUVOX) tablet 25 mg  25 mg Oral Q breakfast Bray Vickerman, MD      . fluvoxaMINE (LUVOX) tablet 50 mg  50 mg Oral QHS Jomarie LongsSaramma Sheldon Sem, MD   50 mg at 05/28/15 2155  . gabapentin (NEURONTIN) capsule 800 mg  800 mg Oral QID Rachael FeeIrving A Lugo, MD   800 mg at 05/29/15 1230  . ibuprofen (ADVIL,MOTRIN) tablet 600 mg  600 mg Oral Q8H PRN Earney NavyJosephine C Onuoha, NP   600 mg at 05/28/15 1652  . insulin aspart (novoLOG) injection 0-15 Units  0-15 Units Subcutaneous TID WC Jomarie LongsSaramma Emmry Hinsch, MD   8 Units at 05/29/15 1230  . lidocaine (LIDODERM) 5 % 2 patch  2 patch Transdermal Q24H Jomarie LongsSaramma Jisselle Poth, MD   2 patch at 05/27/15 1647  . LORazepam (ATIVAN) tablet 1 mg  1 mg Oral Q8H PRN Earney NavyJosephine C Onuoha, NP   1 mg at 05/29/15 0815  . metFORMIN (GLUCOPHAGE) tablet 500 mg  500 mg Oral BID WC Earney NavyJosephine C Onuoha, NP   500 mg at 05/29/15 0811  . methocarbamol (ROBAXIN) tablet 500 mg  500 mg Oral TID Jomarie LongsSaramma Elma Shands, MD   500 mg at 05/29/15 1230  . nicotine (NICODERM CQ - dosed in mg/24 hours) patch 21 mg  21 mg Transdermal Daily Rachael FeeIrving A Lugo, MD   21 mg at 05/29/15 0813  . ondansetron (ZOFRAN) tablet 4 mg  4 mg Oral Q8H PRN Earney NavyJosephine C Onuoha, NP      . pneumococcal 23 valent vaccine (PNU-IMMUNE) injection 0.5 mL  0.5 mL Intramuscular Tomorrow-1000 Rachael FeeIrving A Lugo, MD   0.5 mL at 05/24/15 1043  . QUEtiapine (SEROQUEL) tablet 100 mg  100 mg Oral QHS Jomarie LongsSaramma Addisen Chappelle, MD   100 mg at 05/28/15 2155  . QUEtiapine (SEROQUEL) tablet 50 mg  50 mg Oral TID PRN Jomarie LongsSaramma Alyiah Ulloa, MD   50 mg at 05/29/15 1429  . simvastatin (ZOCOR) tablet 20 mg  20 mg Oral q1800 Calyb Mcquarrie, MD   20 mg at 05/28/15 1718  . zolpidem (AMBIEN) tablet 5 mg  5 mg Oral QHS PRN Jomarie LongsSaramma Terea Neubauer, MD   5 mg at 05/28/15 2155    Lab Results:   Results for orders placed or performed during the hospital encounter of 05/23/15 (from the past 48 hour(s))  Glucose, capillary     Status: Abnormal   Collection Time: 05/27/15  4:39 PM  Result Value Ref Range   Glucose-Capillary 356 (H) 65 - 99 mg/dL  Glucose, capillary     Status:  Abnormal   Collection Time: 05/28/15  5:46 AM  Result Value Ref Range   Glucose-Capillary 184 (H) 65 - 99 mg/dL  Carbamazepine level, total     Status: None   Collection Time: 05/28/15  6:14 AM  Result Value Ref Range   Carbamazepine Lvl 8.9 4.0 - 12.0 ug/mL    Comment: Performed at [facility]  Glucose, capillary     Status: Abnormal   Collection Time: 05/28/15 11:45 AM  Result Value Ref Range   Glucose-Capillary 284 (H) 65 - 99 mg/dL   Comment 1 Notify RN    Comment 2 Document in Chart   Glucose, capillary     Status: Abnormal   Collection Time: 05/28/15  5:05 PM  Result Value Ref Range   Glucose-Capillary 278 (H) 65 - 99 mg/dL   Comment 1 Notify RN    Comment 2 Document in Chart   Glucose, capillary     Status: Abnormal   Collection Time: 05/29/15  6:08 AM  Result Value Ref Range   Glucose-Capillary 219 (H) 65 - 99 mg/dL   Comment 1 Notify RN   Glucose, capillary     Status: Abnormal   Collection Time: 05/29/15 11:40 AM  Result Value Ref Range   Glucose-Capillary 273 (H) 65 - 99 mg/dL   Comment 1 Notify RN    Comment 2 Document in Chart     Blood Alcohol level:  Lab Results  Component Value Date   ETH <5 05/22/2015   ETH <11 10/15/2013    Physical Findings: AIMS: Facial and Oral Movements Muscles of Facial Expression: None, normal Lips and Perioral Area: None, normal Jaw: None, normal Tongue: None, normal,Extremity Movements Upper (arms, wrists, hands, fingers): None, normal Lower (legs, knees, ankles, toes): None, normal, Trunk Movements Neck, shoulders, hips: None, normal, Overall Severity Severity of abnormal movements (highest score from questions above): None,  normal Incapacitation due to abnormal movements: None, normal Patient's awareness of abnormal movements (rate only patient's report): No Awareness, Dental Status Current problems with teeth and/or dentures?: Yes Does patient usually wear dentures?: No  CIWA:  CIWA-Ar Total: 5 COWS:  COWS Total Score: 2  Musculoskeletal: Strength & Muscle Tone: within normal limits Gait & Station: normal Patient leans: normal  Psychiatric Specialty Exam: Review of Systems  Constitutional: Positive for malaise/fatigue.  HENT: Negative.   Eyes: Negative.   Cardiovascular: Negative.   Gastrointestinal: Negative.   Genitourinary: Negative.   Musculoskeletal: Positive for back pain.  Skin: Negative.   Psychiatric/Behavioral: Positive for depression. Suicidal ideas: self injurious behavior on admission, denies today. The patient is nervous/anxious.   All other systems reviewed and are negative.   Blood pressure 157/4, pulse 96, temperature 97.2 F (36.2 C), temperature source Oral, resp. rate 20, height  (1.803 m), weight 102.967 kg (227 lb), SpO2 99 %.Body mass index is 31.67 kg/(m^2).  General Appearance: Disheveled  Eye Contact::  Minimal  Speech:  Clear and Coherent  Volume:  Normal  Mood:  Anxious, Depressed and Irritable  Affect:  Labile  Thought Process:  Linear  Orientation:  Full (Time, Place, and Person)  Thought Content:  Rumination  Suicidal Thoughts:  No   Homicidal Thoughts:  No  Memory:  Immediate;   Fair Recent;   Fair Remote;   Fair  Judgement:  Poor  Insight:  Shallow  Psychomotor Activity:  Restlessness  Concentration:  Poor  Recall:  Fair  Fund of Knowledge:Fair  Language: Fair  Akathisia:  No  Handed:  Right  AIMS (if indicated):     Assets:  Desire for Improvement  ADL's:  Intact  Cognition: WNL  Sleep:  Number of Hours: 4.25   Treatment Plan Summary:Pt is a 60 Y/O caucasian male who lives in [place] , has a hx of panic attacks , depressions, opioid use  disorder, BZD abuse and cannabis abuse, who presented with thoughts of self harm.Pt today continues to be irritable, anxious ,Will continue treatment.  Daily contact with patient to assess and evaluate symptoms and progress in treatment and Medication management Reviewed past medical records,treatment plan. Will continue Tegretol xr 200 mg po bid for mood lability. Tegretol level -8.9 ( 05/28/15) Will continue seroquel 100 mg po qhs for sleep/mood sx as well as seroquel 50 mg po tid prn for severe anxiety/agitation. Will reduce Ambien to 5 mg po qhs prn for sleep. Will DC once pt is able to tolerate seroquel. Will continue Cymbalta 30 mg po daily - will continue this dose since it helps with pain. Will increase Luvox to 25 mg po daily and 50 mg po qhs for affective sx, titrate higher as needed. Will discontinue Trazodone 200 mg po qhs prn - risk of serotonic syndrome on multiple medications. Will continue Gabapentin 800 mg po qid for anxiety/pain. Will continue to monitor vitals ,medication compliance and treatment side effects while patient is here.  Will monitor for medical issues as well as call consult as needed. Restart home medications where indicated. Reviewed labs - added Zocor 20 mg po qpm for abnormal lipid panel, hba1c- 9.4, ekg - wnl . Diabetic management - by SSI /Metformin scheduled. Will get Diabetic consult . CSW will continue working on disposition.  Recreational therapy consult. Patient to participate in therapeutic milieu .       Ezma Rehm, MD 05/29/2015, 2:49 PM

## 2015-05-29 NOTE — [facility] Group Notes (Signed)
[facility] LCSW Group Therapy  05/29/2015 , 3:51 PM   Type of Therapy:  Group Therapy  Participation Level:  Active  Participation Quality:  Attentive  Affect:  Appropriate  Cognitive:  Alert  Insight:  Improving  Engagement in Therapy:  Engaged  Modes of Intervention:  Discussion, Exploration and Socialization  Summary of Progress/Problems: Today's group focused on the term Diagnosis.  Participants were asked to define the term, and then pronounce whether it is a negative, positive or neutral term.  Did not attend.  Daryel Geraldorth, Indiana Pechacek B 05/29/2015 , 3:51 PM

## 2015-05-29 NOTE — Progress Notes (Signed)
Inpatient Diabetes Program Recommendations  AACE/ADA: New Consensus Statement on Inpatient Glycemic Control (2015)  Target Ranges:  Prepandial:   less than 140 mg/dL      Peak postprandial:   less than 180 mg/dL (1-2 hours)      Critically ill patients:  140 - 180 mg/dL   Review of Glycemic Control  Diabetes history: DM2 Outpatient Diabetes medications: previously on metformin at home Current orders for Inpatient glycemic control: metformin 500 mg bid, Novolog moderate tidwc  Results for Ian JubaHODGES, Ryan [name] (MRN [id number]) as of 05/29/2015 15:44  Ref. Range 05/25/2015 06:30  Hemoglobin A1C Latest Ref Range: 4.8-5.6 % 9.4 (H)  Results for Ian JubaHODGES, [name] (MRN [id number]) as of 05/29/2015 15:44  Ref. Range 05/28/2015 05:46 05/28/2015 11:45 05/28/2015 17:05 05/29/2015 06:08 05/29/2015 11:40  Glucose-Capillary Latest Ref Range: 65-99 mg/dL 696184 (H) 295284 (H) 284278 (H) 219 (H) 273 (H)   Elevated blood sugars. Poor control at home with HgbA1C of 9.4%. Needs basal insulin.  Inpatient Diabetes Program Recommendations:    Add Lantus 15 units QHS Will likely need meal coverage insulin. Consider Novolog 4 units tidwc for meal coverage insulin. Needs medication adjustment prior to discharge. F/U with PCP for management of DM.  Will continue to follow. Encourage pt to make healthy choices in cafeteria using portion control. Limit snacks to low CHO choices.  Thank you. Ailene Ardshonda Giannamarie Paulus, RD, LDN, CDE Inpatient Diabetes Coordinator [phone]

## 2015-05-29 NOTE — Progress Notes (Signed)
Patient ID: [name], male   DOB: [birth year], 60 y.o.   MRN: [id number] PER STATE REGULATIONS 482.30  THIS CHART WAS REVIEWED FOR MEDICAL NECESSITY WITH RESPECT TO THE PATIENT'S ADMISSION/ DURATION OF STAY.  NEXT REVIEW DATE:   05/31/2015  Willa RoughJENNIFER JONES Benton Tooker, RN, BSN CASE MANAGER'

## 2015-05-29 NOTE — Progress Notes (Signed)
Per request of Tega in admissions at [facility], updated records faxed.  Trula SladeHeather Smart, MSW, LCSW Clinical Social Worker 05/29/2015 3:17 PM

## 2015-05-29 NOTE — Progress Notes (Signed)
Pt had been in room much of the evening, limited interaction or participation. Pt has appeared irritable and agitated this evening and feels that his pain is not being addressed and spoke about how he takes pain medications at home. Pt initially refused all bedtime medication but later did come and get them and was able to respond appropriately to encouragement and support from staff.

## 2015-05-30 LAB — GLUCOSE, CAPILLARY
GLUCOSE-CAPILLARY: 217 mg/dL — AB (ref 65–99)
GLUCOSE-CAPILLARY: 308 mg/dL — AB (ref 65–99)
Glucose-Capillary: 227 mg/dL — ABNORMAL HIGH (ref 65–99)
Glucose-Capillary: 208 mg/dL — ABNORMAL HIGH (ref 65–99)

## 2015-05-30 MED ORDER — LACTULOSE 10 GM/15ML PO SOLN
20.0000 g | Freq: Two times a day (BID) | ORAL | Status: DC | PRN
  Filled 2015-05-30 (×2): qty 30

## 2015-05-30 MED ORDER — INSULIN ASPART 100 UNIT/ML ~~LOC~~ SOLN
4.0000 [IU] | Freq: Three times a day (TID) | SUBCUTANEOUS | Status: DC
  Administered 2015-05-30 – 2015-06-01 (×6): 4 [IU] via SUBCUTANEOUS

## 2015-05-30 MED ORDER — OXYCODONE HCL 5 MG PO TABS
10.0000 mg | ORAL_TABLET | Freq: Four times a day (QID) | ORAL | Status: DC | PRN
  Administered 2015-05-30 – 2015-06-01 (×6): 10 mg via ORAL
  Filled 2015-05-30 (×6): qty 2

## 2015-05-30 MED ORDER — INSULIN GLARGINE 100 UNIT/ML ~~LOC~~ SOLN
15.0000 [IU] | Freq: Every day | SUBCUTANEOUS | Status: DC
  Administered 2015-05-30 – 2015-05-31 (×2): 15 [IU] via SUBCUTANEOUS

## 2015-05-30 NOTE — [facility] Group Notes (Signed)
[facility]   05/30/2015 11:10 AM  Participation Quality:  Although pt declined to attend group, he was willing to engage around plan going forward.  Stated he is open to going to [facility] if they offer a bed, and also confirmed his willingness to work with his PCP re: a pain management referral.  "I used to go to [facility].  As far as I know, I am in good standing with them.  I quit because I thought smoking weed might be a good alternative.  The pain meds didn't seem to be as effective as they once were.  I guess I was wrong."  Signed release for PCP.    [name], [name] B

## 2015-05-30 NOTE — Progress Notes (Signed)
D: Pt presents blunted in affect and anxious in mood. Pt complains that his pain medications are not strong enough.  Pt was encouraged to utilize his current prn pain medications before revisiting the topic with the psychiatrist. Pt had no relief with the use of tylenol for his generalized pain. Pt was given his scheduled qhs medications that included Gabapentin to help alleviate his pain. Emotional support given to pt for his concerns. Pt is currently denying any SI/HI/AVH. Pt verbally contracts for safety.  A: Writer administered scheduled and prn medications to pt, per MD orders. Continued support and availability as needed was extended to this pt. Staff continues to monitor pt with q5115min checks.  R: No adverse drug reactions noted. Pt receptive to treatment. Pt remains safe at this time.

## 2015-05-30 NOTE — Progress Notes (Signed)
D: Pt presents blunted in affect and anxious in mood. Pt reports to be doing "better" today. [facility] However, pt is unsure if he can remain free from self-harm upon d/c. Pt verbally ccntracts for safety with Clinical research associatewriter. Pt reports an improvement in his pain releif today. Pt denies any HI/AVH. Pt is compliant with his current POC.  A: Writer administered scheduled and prn medications to pt, per MD orders. Continued support and availability as needed was extended to this pt. Staff continues to monitor pt with q4315min checks.  R: No adverse drug reactions noted. Pt receptive to treatment. Pt remains safe at this time.

## 2015-05-30 NOTE — Progress Notes (Addendum)
[facility] Progress Note  05/30/2015 3:49 PM Peggyann JubaHugh Donald Stricker  MRN:  [id number] Subjective:  Pt states " I am anxious , I am in pain."     Objective: Pt is a 60 Y/O caucasian male [place] in [place] , has a hx of panic attacks , depressions, opioid use disorder, BZD abuse and cannabis abuse, who presented with thoughts of self harm.  Patient seen and chart reviewed.Discussed patient with treatment team.  Pt today appears anxious , continues to be vaguely irritable , focussed on his pain . Pt otherwise denies any new concerns. Pt observed as more visible in milieu. Per staff pt continues to be irritable , demanding ,continues to need encouragement and support.       Principal Problem: MDD (major depressive disorder), recurrent episode, severe (HCC) Diagnosis:   Patient Active Problem List   Diagnosis Date Noted  . Opioid use disorder, severe, in sustained remission [F11.90] 05/25/2015  . Benzodiazepine abuse [F13.10] 05/25/2015  . Cannabis use disorder, moderate, dependence (HCC) [F12.20] 05/25/2015  . MDD (major depressive disorder), recurrent episode, severe (HCC) [F33.2] 05/23/2015  . Chronic pain syndrome [G89.4] 05/23/2015  . GAD (generalized anxiety disorder) [F41.1] 05/23/2015  . CVA (cerebral infarction) [I63.9] 10/15/2013  . Hypertension [I10] 10/15/2013  . Diabetes mellitus (HCC) [E11.9] 10/15/2013   Total Time spent with patient: 25 minutes  Past Psychiatric History: see admission H and P  Past Medical History:  Past Medical History  Diagnosis Date  . Stroke (HCC)   . Diabetes mellitus without complication (HCC)   . Hypertension   . Depression    History reviewed. No pertinent past surgical history. Family History:Pt reports he does not know details about family hx . Family Psychiatric  History: Patient's father had mental illness, he shot self and patient found him . Pt's grandfather also had mental illness and killed self. Social History: Please see  H&p. History  Alcohol Use No     History  Drug Use  . Yes  . Special: Marijuana, Cocaine    Social History   Social History  . Marital Status: Divorced    Spouse Name: N/A  . Number of Children: N/A  . Years of Education: N/A   Social History Main Topics  . Smoking status: Current Every Day Smoker -- 1.00 packs/day    Types: Cigarettes  . Smokeless tobacco: None  . Alcohol Use: No  . Drug Use: Yes    Special: Marijuana, Cocaine  . Sexual Activity: Not Asked     Comment: week ago   Other Topics Concern  . None   Social History Narrative   Additional Social History:    Pain Medications: See PTA Prescriptions: See PTA Over the Counter: See PTA History of alcohol / drug use?: Yes Negative Consequences of Use: Financial, Personal relationships Withdrawal Symptoms: Agitation, Irritability Name of Substance 1: THC 1 - Age of First Use: 17 1 - Amount (size/oz): 1 gram per day 1 - Frequency: "whenever i can afford it" 1 - Duration: ongoing 1 - Last Use / Amount: "2 joints" yesterday Name of Substance 2: opiates 2 - Age of First Use: 58 2 - Amount (size/oz): $20 bag (heroin) 2 - Frequency: "very selfom" 2 - Duration: ongoing 2 - Last Use / Amount: 2 weeks ago                Sleep: Fair  Appetite:  Fair  Current Medications: Current Facility-Administered Medications  Medication Dose Route Frequency Provider Last Rate  Last Dose  . acetaminophen (TYLENOL) tablet 650 mg  650 mg Oral Q4H PRN Earney Navy, NP   650 mg at 05/29/15 2002  . alum & mag hydroxide-simeth (MAALOX/MYLANTA) 200-200-20 MG/5ML suspension 30 mL  30 mL Oral PRN Earney Navy, NP      . amLODipine (NORVASC) tablet 10 mg  10 mg Oral Daily Rosibel Giacobbe, MD   10 mg at 05/30/15 0827  . carbamazepine (TEGRETOL XR) 12 hr tablet 200 mg  200 mg Oral BID Rachael Fee, MD   200 mg at 05/30/15 4540  . cloNIDine (CATAPRES) tablet 0.1 mg  0.1 mg Oral BID Jomarie Longs, MD   0.1 mg at  05/30/15 0827  . DULoxetine (CYMBALTA) DR capsule 30 mg  30 mg Oral Daily Jomarie Longs, MD   30 mg at 05/30/15 0827  . fluvoxaMINE (LUVOX) tablet 25 mg  25 mg Oral Q breakfast Jomarie Longs, MD   25 mg at 05/30/15 0827  . fluvoxaMINE (LUVOX) tablet 50 mg  50 mg Oral QHS Jomarie Longs, MD   50 mg at 05/29/15 2146  . gabapentin (NEURONTIN) capsule 800 mg  800 mg Oral QID Rachael Fee, MD   800 mg at 05/30/15 1304  . ibuprofen (ADVIL,MOTRIN) tablet 600 mg  600 mg Oral Q8H PRN Earney Navy, NP   600 mg at 05/28/15 1652  . insulin aspart (novoLOG) injection 0-15 Units  0-15 Units Subcutaneous TID WC Jomarie Longs, MD   5 Units at 05/30/15 1303  . insulin aspart (novoLOG) injection 4 Units  4 Units Subcutaneous TID WC Dshawn Mcnay, MD      . insulin glargine (LANTUS) injection 15 Units  15 Units Subcutaneous QHS Dallie Patton, MD      . lidocaine (LIDODERM) 5 % 2 patch  2 patch Transdermal Q24H Jomarie Longs, MD   2 patch at 05/27/15 1647  . LORazepam (ATIVAN) tablet 1 mg  1 mg Oral Q8H PRN Earney Navy, NP   1 mg at 05/29/15 0815  . metFORMIN (GLUCOPHAGE) tablet 500 mg  500 mg Oral BID WC Earney Navy, NP   500 mg at 05/30/15 0826  . methocarbamol (ROBAXIN) tablet 500 mg  500 mg Oral TID Jomarie Longs, MD   500 mg at 05/30/15 1305  . nicotine (NICODERM CQ - dosed in mg/24 hours) patch 21 mg  21 mg Transdermal Daily Rachael Fee, MD   21 mg at 05/30/15 0826  . ondansetron (ZOFRAN) tablet 4 mg  4 mg Oral Q8H PRN Earney Navy, NP      . oxyCODONE (Oxy IR/ROXICODONE) immediate release tablet 10 mg  10 mg Oral Q6H PRN Aren Pryde, MD      . pneumococcal 23 valent vaccine (PNU-IMMUNE) injection 0.5 mL  0.5 mL Intramuscular Tomorrow-1000 Rachael Fee, MD   0.5 mL at 05/24/15 1043  . QUEtiapine (SEROQUEL) tablet 100 mg  100 mg Oral QHS Jomarie Longs, MD   100 mg at 05/29/15 2148  . QUEtiapine (SEROQUEL) tablet 50 mg  50 mg Oral TID PRN Jomarie Longs, MD   50 mg at  05/29/15 1429  . simvastatin (ZOCOR) tablet 20 mg  20 mg Oral q1800 Jomarie Longs, MD   20 mg at 05/29/15 2146  . zolpidem (AMBIEN) tablet 5 mg  5 mg Oral QHS PRN Jomarie Longs, MD   5 mg at 05/29/15 2148    Lab Results:  Results for orders placed or performed during the hospital  encounter of 05/23/15 (from the past 48 hour(s))  Glucose, capillary     Status: Abnormal   Collection Time: 05/28/15  5:05 PM  Result Value Ref Range   Glucose-Capillary 278 (H) 65 - 99 mg/dL   Comment 1 Notify RN    Comment 2 Document in Chart   Glucose, capillary     Status: Abnormal   Collection Time: 05/29/15  6:08 AM  Result Value Ref Range   Glucose-Capillary 219 (H) 65 - 99 mg/dL   Comment 1 Notify RN   Glucose, capillary     Status: Abnormal   Collection Time: 05/29/15 11:40 AM  Result Value Ref Range   Glucose-Capillary 273 (H) 65 - 99 mg/dL   Comment 1 Notify RN    Comment 2 Document in Chart   Glucose, capillary     Status: Abnormal   Collection Time: 05/29/15  5:03 PM  Result Value Ref Range   Glucose-Capillary 213 (H) 65 - 99 mg/dL  Glucose, capillary     Status: Abnormal   Collection Time: 05/30/15  5:43 AM  Result Value Ref Range   Glucose-Capillary 217 (H) 65 - 99 mg/dL  Glucose, capillary     Status: Abnormal   Collection Time: 05/30/15 12:16 PM  Result Value Ref Range   Glucose-Capillary 227 (H) 65 - 99 mg/dL   Comment 1 Notify RN    Comment 2 Document in Chart     Blood Alcohol level:  Lab Results  Component Value Date   ETH <5 05/22/2015   ETH <11 10/15/2013    Physical Findings: AIMS: Facial and Oral Movements Muscles of Facial Expression: None, normal Lips and Perioral Area: None, normal Jaw: None, normal Tongue: None, normal,Extremity Movements Upper (arms, wrists, hands, fingers): None, normal Lower (legs, knees, ankles, toes): None, normal, Trunk Movements Neck, shoulders, hips: None, normal, Overall Severity Severity of abnormal movements (highest score  from questions above): None, normal Incapacitation due to abnormal movements: None, normal Patient's awareness of abnormal movements (rate only patient's report): No Awareness, Dental Status Current problems with teeth and/or dentures?: Yes Does patient usually wear dentures?: No  CIWA:  CIWA-Ar Total: 5 COWS:  COWS Total Score: 2  Musculoskeletal: Strength & Muscle Tone: within normal limits Gait & Station: normal Patient leans: normal  Psychiatric Specialty Exam: Review of Systems  Constitutional: Positive for malaise/fatigue.  HENT: Negative.   Eyes: Negative.   Cardiovascular: Negative.   Gastrointestinal: Negative.   Genitourinary: Negative.   Musculoskeletal: Positive for back pain.  Skin: Negative.   Psychiatric/Behavioral: Positive for depression. Suicidal ideas: self injurious behavior on admission, denies today. The patient is nervous/anxious.   All other systems reviewed and are negative.   Blood pressure 161/87, pulse 90, temperature 97.8 F (36.6 C), temperature source Oral, resp. rate 18, height  (1.803 m), weight 102.967 kg (227 lb), SpO2 99 %.Body mass index is 31.67 kg/(m^2).  General Appearance: Casual  Eye Contact::  Minimal  Speech:  Clear and Coherent  Volume:  Normal  Mood:  Anxious, Depressed and Irritable  Affect:  Labile  Thought Process:  Linear  Orientation:  Full (Time, Place, and Person)  Thought Content:  Rumination  Suicidal Thoughts:  No   Homicidal Thoughts:  No  Memory:  Immediate;   Fair Recent;   Fair Remote;   Fair  Judgement:  Poor  Insight:  Shallow  Psychomotor Activity:  Restlessness  Concentration:  Poor  Recall:  [employer] of Knowledge:Fair  Language: Fair  Akathisia:  No  Handed:  Right  AIMS (if indicated):     Assets:  Desire for Improvement  ADL's:  Intact  Cognition: WNL  Sleep:  Number of Hours: 5   Treatment Plan Summary:Pt is a 60 Y/O caucasian male who lives in [place] , has a hx of panic attacks ,  depressions, opioid use disorder, BZD abuse and cannabis abuse, who presented with thoughts of self harm.Pt today continues to be irritable, anxious ,Will continue treatment.  Daily contact with patient to assess and evaluate symptoms and progress in treatment and Medication management Reviewed past medical records,treatment plan. Will continue Tegretol xr 200 mg po bid for mood lability. Tegretol level -8.9 ( 05/28/15) Will continue seroquel 100 mg po qhs for sleep/mood sx as well as seroquel 50 mg po tid prn for severe anxiety/agitation. Will reduce Ambien to 5 mg po qhs prn for sleep. Will DC once pt is able to tolerate seroquel. Will continue Cymbalta 30 mg po daily - will continue this dose since it helps with pain. Increased Luvox to 25 mg po daily and 50 mg po qhs for affective sx, titrate higher as needed. Will discontinue Trazodone 200 mg po qhs prn - risk of serotonic syndrome on multiple medications. Will continue Gabapentin 800 mg po qid for anxiety/pain. Will add Oxycodone IR 10 mg po q6h prn for severe Pain. CSW has obtained pt an appointment with his PCP for pain management referral. Will continue to monitor vitals ,medication compliance and treatment side effects while patient is here.  Will monitor for medical issues as well as call consult as needed. Restart home medications where indicated. Reviewed labs - added Zocor 20 mg po qpm for abnormal lipid panel, hba1c- 9.4, ekg - wnl . Diabetic management - by [facility]SI /Metformin scheduled.Diabetic consult obtained - will follow recommendations. CSW will continue working on disposition.  Recreational therapy consult. Patient to participate in therapeutic milieu .       Shaden Higley, MD 05/30/2015, 3:49 PM

## 2015-05-30 NOTE — Progress Notes (Signed)
D-  Patient denies SI, HI and AVH  Patient was calm and cooperative this shift with no behavioral dyscontrol.  Patient stated that he wanted to go home and continue to take his medications.  Patient complained of chronic pain and was medicated accordingly.   A- Assess patient for safety, offer medications as prescribed, engage patient in 1:1 staff talks.  R-  Patient able to contract for safety.

## 2015-05-30 NOTE — [facility] Group Notes (Signed)
[facility] LCSW Group Therapy  05/30/2015 1:42 PM  Type of Therapy: Group Therapy  Participation Level: Active  Participation Quality: Attentive  Affect: Flat  Cognitive: Oriented  Insight: Limited  Engagement in Therapy: Engaged  Modes of Intervention: Discussion and Socialization  Summary of Progress/Problems: [name] from [facility] [employer] was here to tell his story of recovery and play his guitar.  Initially stated that he wasn't going to come but ended up coming anyway. Was pleasant and alert for the duration of the group. Asked questions when appropriate.  [name] 05/30/2015 1:42 PM

## 2015-05-30 NOTE — Progress Notes (Signed)
Inpatient Diabetes Program Recommendations  AACE/ADA: New Consensus Statement on Inpatient Glycemic Control (2015)  Target Ranges:  Prepandial:   less than 140 mg/dL      Peak postprandial:   less than 180 mg/dL (1-2 hours)      Critically ill patients:  140 - 180 mg/dL   Review of Glycemic Control  Results for [name], [name] (MRN [id number]) as of 05/30/2015 11:02  Ref. Range 05/28/2015 11:45 05/28/2015 17:05 05/29/2015 06:08 05/29/2015 11:40 05/29/2015 17:03 05/30/2015 05:43  Glucose-Capillary Latest Ref Range: 65-99 mg/dL 562284 (H) 130278 (H) 865219 (H) 273 (H) 213 (H) 217 (H)  Results for [name], [name] (MRN [id number]) as of 05/30/2015 11:02  Ref. Range 05/25/2015 06:30  Hemoglobin A1C Latest Ref Range: 4.8-5.6 % 9.4 (H)  Blood sugars uncontrolled since admission. Needs change in diabetes meds to control blood sugars.  Inpatient Diabetes Program Recommendations:  Add Lantus 15 units QHS Will likely need meal coverage insulin. Consider Novolog 4 units tidwc for meal coverage insulin. Needs medication adjustment prior to discharge. F/U with PCP for management of DM.  Will call RN to discuss. Thank you. Ailene Ardshonda Kyvon Hu, RD, LDN, CDE Inpatient Diabetes Coordinator [phone]

## 2015-05-30 NOTE — Progress Notes (Signed)
Recreation Therapy Notes  04.04.2017 Per MD order LRT met with patient to investigate ways to enhance tx during admission. LRT met with MD prior to meeting with patient, MD shared that patient has numerous medical conditions which cause him pain. Patient reports that he was admitted because he was abusing pain medication and purchasing it off the street. Patient reports he has been medicated for pain for approximately 25 years. Patient shared that "anything really" stresses him out, failing to be more specific. Patient identified no coping skills and leisure interest including, motorcycles, being outside and water activities, such as fishing. Patient expressed he has practiced deep breathing in the past and finds his minimally effective. LRT introduced patient to mindful breathing, patient receptive, but stated that he found it difficult to only focus on his breath and he found it no more helpful than the deep breathing techniques he has previously participated in. Patient did express interest in learning additional techniques during admission, LRT will introduce to patient during admission.   Laureen Ochs Harve Spradley, LRT/CTRS   Lane Hacker 05/30/2015 4:26 PM

## 2015-05-31 LAB — GLUCOSE, CAPILLARY
GLUCOSE-CAPILLARY: 238 mg/dL — AB (ref 65–99)
GLUCOSE-CAPILLARY: 169 mg/dL — AB (ref 65–99)
Glucose-Capillary: 265 mg/dL — ABNORMAL HIGH (ref 65–99)
Glucose-Capillary: 285 mg/dL — ABNORMAL HIGH (ref 65–99)

## 2015-05-31 MED ORDER — MAGNESIUM CITRATE PO SOLN
1.0000 | Freq: Once | ORAL | Status: AC
  Administered 2015-05-31: 1 via ORAL

## 2015-05-31 MED ORDER — FLUVOXAMINE MALEATE 50 MG PO TABS
75.0000 mg | ORAL_TABLET | Freq: Every day | ORAL | Status: DC
  Administered 2015-05-31: 75 mg via ORAL
  Filled 2015-05-31 (×3): qty 2

## 2015-05-31 MED ORDER — LORAZEPAM 1 MG PO TABS: 1.0000 mg | ORAL_TABLET | Freq: Three times a day (TID) | ORAL | Status: DC | PRN

## 2015-05-31 MED ORDER — CLONIDINE HCL 0.1 MG PO TABS
0.1000 mg | ORAL_TABLET | Freq: Three times a day (TID) | ORAL | Status: DC
  Administered 2015-06-01 (×2): 0.1 mg via ORAL
  Filled 2015-05-31 (×7): qty 1

## 2015-05-31 MED ORDER — QUETIAPINE FUMARATE 200 MG PO TABS
200.0000 mg | ORAL_TABLET | Freq: Every day | ORAL | Status: DC
Start: 2015-05-31 — End: 2015-06-01
  Administered 2015-05-31: 200 mg via ORAL
  Filled 2015-05-31 (×3): qty 1

## 2015-05-31 NOTE — [facility] Group Notes (Signed)
[facility] Group Notes:  (Counselor/Nursing/MHT/Case Management/Adjunct)  05/31/2015 1:15PM  Type of Therapy:  Group Therapy  Participation Level:  Active  Participation Quality:  Appropriate  Affect:  Flat  Cognitive:  Oriented  Insight:  Improving  Engagement in Group:  Limited  Engagement in Therapy:  Limited  Modes of Intervention:  Discussion, Exploration and Socialization  Summary of Progress/Problems: The topic for group was balance in life.  Pt participated in the discussion about when their life was in balance and out of balance and how this feels.  Pt discussed ways to get back in balance and short term goals they can work on to get where they want to be. "I don't feel well."  Declined to attend.   [name], [name] B 05/31/2015 1:03 PM

## 2015-05-31 NOTE — Progress Notes (Signed)
D- Patient denies SI, HI and AVH Patient was calm and cooperative this shift with no behavioral dyscontrol. Patient stated that he wanted to go home and continue to take his medications. Patient complained of chronic pain and was medicated accordingly. Patient came out of his room and participated in milieu activities and engaged in social conversation with peers.   A- Assess patient for safety, offer medications as prescribed, engage patient in 1:1 staff talks.  R- Patient able to contract for safety.

## 2015-05-31 NOTE — Progress Notes (Addendum)
[facility] MD Progress Note  05/31/2015 2:50 PM [name]  MRN:  [id number] Subjective:  Pt states " I am not doing too good. I ruminate a lot of about many things , my thoughts just go on and on."      Objective: Pt is a 60 Y/O caucasian male who lives in [place] , has a hx of panic attacks , depressions, opioid use disorder, BZD abuse and cannabis abuse, who presented with thoughts of self harm.  Patient seen and chart reviewed.Discussed patient with treatment team.  Pt today appears irritable , reports racing thoughts . Pt is not too focussed on his pain , now that he is back on his opioid pain medictaions. Pt denies any ADRs of medications, tolerating them well. Pt observed as more visible in milieu. Per staff pt continues to be irritable , demanding ,continues to need encouragement and support.       Principal Problem: MDD (major depressive disorder), recurrent episode, severe (HCC) Diagnosis:   Patient Active Problem List   Diagnosis Date Noted  . Opioid use disorder, severe, in sustained remission [F11.90] 05/25/2015  . Benzodiazepine abuse [F13.10] 05/25/2015  . Cannabis use disorder, moderate, dependence (HCC) [F12.20] 05/25/2015  . MDD (major depressive disorder), recurrent episode, severe (HCC) [F33.2] 05/23/2015  . Chronic pain syndrome [G89.4] 05/23/2015  . GAD (generalized anxiety disorder) [F41.1] 05/23/2015  . CVA (cerebral infarction) [I63.9] 10/15/2013  . Hypertension [I10] 10/15/2013  . Diabetes mellitus (HCC) [E11.9] 10/15/2013   Total Time spent with patient: 25 minutes  Past Psychiatric History: see admission H and P  Past Medical History:  Past Medical History  Diagnosis Date  . Stroke (HCC)   . Diabetes mellitus without complication (HCC)   . Hypertension   . Depression    History reviewed. No pertinent past surgical history. Family History:Pt reports he does not know details about family hx . Family Psychiatric  History: Patient's father had  mental illness, he shot self and patient found him . Pt's grandfather also had mental illness and killed self. Social History: Please see H&p. History  Alcohol Use No     History  Drug Use  . Yes  . Special: Marijuana, Cocaine    Social History   Social History  . Marital Status: Divorced    Spouse Name: N/A  . Number of Children: N/A  . Years of Education: N/A   Social History Main Topics  . Smoking status: Current Every Day Smoker -- 1.00 packs/day    Types: Cigarettes  . Smokeless tobacco: None  . Alcohol Use: No  . Drug Use: Yes    Special: Marijuana, Cocaine  . Sexual Activity: Not Asked     Comment: week ago   Other Topics Concern  . None   Social History Narrative   Additional Social History:    Pain Medications: See PTA Prescriptions: See PTA Over the Counter: See PTA History of alcohol / drug use?: Yes Negative Consequences of Use: Financial, Personal relationships Withdrawal Symptoms: Agitation, Irritability Name of Substance 1: THC 1 - Age of First Use: 17 1 - Amount (size/oz): 1 gram per day 1 - Frequency: "whenever i can afford it" 1 - Duration: ongoing 1 - Last Use / Amount: "2 joints" yesterday Name of Substance 2: opiates 2 - Age of First Use: 58 2 - Amount (size/oz): $20 bag (heroin) 2 - Frequency: "very selfom" 2 - Duration: ongoing 2 - Last Use / Amount: 2 weeks ago  Sleep: Fair  Appetite:  Fair  Current Medications: Current Facility-Administered Medications  Medication Dose Route Frequency Provider Last Rate Last Dose  . acetaminophen (TYLENOL) tablet 650 mg  650 mg Oral Q4H PRN Earney NavyJosephine C Onuoha, NP   650 mg at 05/29/15 2002  . alum & mag hydroxide-simeth (MAALOX/MYLANTA) 200-200-20 MG/5ML suspension 30 mL  30 mL Oral PRN Earney NavyJosephine C Onuoha, NP      . amLODipine (NORVASC) tablet 10 mg  10 mg Oral Daily Jomarie LongsSaramma Tama Grosz, MD   10 mg at 05/31/15 0759  . carbamazepine (TEGRETOL XR) 12 hr tablet 200 mg  200 mg Oral  BID Rachael FeeIrving A Lugo, MD   200 mg at 05/31/15 0759  . cloNIDine (CATAPRES) tablet 0.1 mg  0.1 mg Oral BID Jomarie LongsSaramma Jamin Panther, MD   0.1 mg at 05/31/15 0759  . DULoxetine (CYMBALTA) DR capsule 30 mg  30 mg Oral Daily Jomarie LongsSaramma Asyia Hornung, MD   30 mg at 05/31/15 0758  . fluvoxaMINE (LUVOX) tablet 25 mg  25 mg Oral Q breakfast Jomarie LongsSaramma Amarrah Meinhart, MD   25 mg at 05/31/15 0758  . fluvoxaMINE (LUVOX) tablet 75 mg  75 mg Oral QHS Keerat Denicola, MD      . gabapentin (NEURONTIN) capsule 800 mg  800 mg Oral QID Rachael FeeIrving A Lugo, MD   800 mg at 05/31/15 1319  . ibuprofen (ADVIL,MOTRIN) tablet 600 mg  600 mg Oral Q8H PRN Earney NavyJosephine C Onuoha, NP   600 mg at 05/28/15 1652  . insulin aspart (novoLOG) injection 0-15 Units  0-15 Units Subcutaneous TID WC Jomarie LongsSaramma Letrice Pollok, MD   8 Units at 05/31/15 1319  . insulin aspart (novoLOG) injection 4 Units  4 Units Subcutaneous TID WC Jomarie LongsSaramma Eliaz Fout, MD   4 Units at 05/31/15 1321  . insulin glargine (LANTUS) injection 15 Units  15 Units Subcutaneous QHS Jomarie LongsSaramma Jatniel Verastegui, MD   15 Units at 05/30/15 2149  . lactulose (CHRONULAC) 10 GM/15ML solution 20 g  20 g Oral BID PRN Kerry HoughSpencer E Simon, PA-C      . lidocaine (LIDODERM) 5 % 2 patch  2 patch Transdermal Q24H Jomarie LongsSaramma Kellyn Mansfield, MD   2 patch at 05/27/15 1647  . LORazepam (ATIVAN) tablet 1 mg  1 mg Oral Q8H PRN Kely Dohn, MD      . metFORMIN (GLUCOPHAGE) tablet 500 mg  500 mg Oral BID WC Earney NavyJosephine C Onuoha, NP   500 mg at 05/31/15 0801  . methocarbamol (ROBAXIN) tablet 500 mg  500 mg Oral TID Jomarie LongsSaramma Suzane Vanderweide, MD   500 mg at 05/31/15 1319  . nicotine (NICODERM CQ - dosed in mg/24 hours) patch 21 mg  21 mg Transdermal Daily Rachael FeeIrving A Lugo, MD   21 mg at 05/31/15 0802  . ondansetron (ZOFRAN) tablet 4 mg  4 mg Oral Q8H PRN Earney NavyJosephine C Onuoha, NP      . oxyCODONE (Oxy IR/ROXICODONE) immediate release tablet 10 mg  10 mg Oral Q6H PRN Jomarie LongsSaramma Azaiah Mello, MD   10 mg at 05/31/15 1402  . pneumococcal 23 valent vaccine (PNU-IMMUNE) injection 0.5 mL  0.5 mL Intramuscular  Tomorrow-1000 Rachael FeeIrving A Lugo, MD   0.5 mL at 05/24/15 1043  . QUEtiapine (SEROQUEL) tablet 200 mg  200 mg Oral QHS Jj Enyeart, MD      . QUEtiapine (SEROQUEL) tablet 50 mg  50 mg Oral TID PRN Jomarie LongsSaramma Gianlucas Evenson, MD   50 mg at 05/29/15 1429  . simvastatin (ZOCOR) tablet 20 mg  20 mg Oral q1800 Jomarie LongsSaramma Agapita Savarino, MD   20  mg at 05/30/15 1909    Lab Results:  Results for orders placed or performed during the hospital encounter of 05/23/15 (from the past 48 hour(s))  Glucose, capillary     Status: Abnormal   Collection Time: 05/29/15  5:03 PM  Result Value Ref Range   Glucose-Capillary 213 (H) 65 - 99 mg/dL  Glucose, capillary     Status: Abnormal   Collection Time: 05/30/15  5:43 AM  Result Value Ref Range   Glucose-Capillary 217 (H) 65 - 99 mg/dL  Glucose, capillary     Status: Abnormal   Collection Time: 05/30/15 12:16 PM  Result Value Ref Range   Glucose-Capillary 227 (H) 65 - 99 mg/dL   Comment 1 Notify RN    Comment 2 Document in Chart   Glucose, capillary     Status: Abnormal   Collection Time: 05/30/15  4:21 PM  Result Value Ref Range   Glucose-Capillary 308 (H) 65 - 99 mg/dL  Glucose, capillary     Status: Abnormal   Collection Time: 05/30/15  8:39 PM  Result Value Ref Range   Glucose-Capillary 208 (H) 65 - 99 mg/dL  Glucose, capillary     Status: Abnormal   Collection Time: 05/31/15  5:54 AM  Result Value Ref Range   Glucose-Capillary 238 (H) 65 - 99 mg/dL  Glucose, capillary     Status: Abnormal   Collection Time: 05/31/15 11:48 AM  Result Value Ref Range   Glucose-Capillary 265 (H) 65 - 99 mg/dL    Blood Alcohol level:  Lab Results  Component Value Date   ETH <5 05/22/2015   ETH <11 10/15/2013    Physical Findings: AIMS: Facial and Oral Movements Muscles of Facial Expression: None, normal Lips and Perioral Area: None, normal Jaw: None, normal Tongue: None, normal,Extremity Movements Upper (arms, wrists, hands, fingers): None, normal Lower (legs, knees, ankles,  toes): None, normal, Trunk Movements Neck, shoulders, hips: None, normal, Overall Severity Severity of abnormal movements (highest score from questions above): None, normal Incapacitation due to abnormal movements: None, normal Patient's awareness of abnormal movements (rate only patient's report): No Awareness, Dental Status Current problems with teeth and/or dentures?: Yes Does patient usually wear dentures?: No  CIWA:  CIWA-Ar Total: 5 COWS:  COWS Total Score: 2  Musculoskeletal: Strength & Muscle Tone: within normal limits Gait & Station: normal Patient leans: normal  Psychiatric Specialty Exam: Review of Systems  HENT: Negative.   Eyes: Negative.   Cardiovascular: Negative.   Gastrointestinal: Negative.   Genitourinary: Negative.   Musculoskeletal: Positive for back pain.  Skin: Negative.   Psychiatric/Behavioral: Positive for depression. Suicidal ideas: self injurious behavior on admission, denies today. The patient is nervous/anxious.   All other systems reviewed and are negative.   Blood pressure 151/91, pulse 102, temperature 98.2 F (36.8 C), temperature source Oral, resp. rate 16, height 5\' 11"  (1.803 m), weight 102.967 kg (227 lb), SpO2 99 %.Body mass index is 31.67 kg/(m^2).  General Appearance: Casual  Eye Contact::  Fair  Speech:  Clear and Coherent  Volume:  Normal  Mood:  Anxious and Irritable  Affect:  Labile  Thought Process:  Linear  Orientation:  Full (Time, Place, and Person)  Thought Content:  Rumination  Suicidal Thoughts:  No   Homicidal Thoughts:  No  Memory:  Immediate;   Fair Recent;   Fair Remote;   Fair  Judgement:  Poor  Insight:  Shallow  Psychomotor Activity:  Restlessness  Concentration:  Poor  Recall:  Fair  Fund of Knowledge:Fair  Language: Fair  Akathisia:  No  Handed:  Right  AIMS (if indicated):     Assets:  Desire for Improvement  ADL's:  Intact  Cognition: WNL  Sleep:  Number of Hours: 4.25   Treatment Plan Summary:Pt  is a 60 Y/O caucasian male who lives in [place] , has a hx of panic attacks , depressions, opioid use disorder, BZD abuse and cannabis abuse, who presented with thoughts of self harm.Pt today continues to be irritable .Will continue treatment.  Daily contact with patient to assess and evaluate symptoms and progress in treatment and Medication management Reviewed past medical records,treatment plan. Will continue Tegretol xr 200 mg po bid for mood lability. Tegretol level -8.9 ( 05/28/15) Will increase seroquel to 200 mg po qhs for sleep/mood sx as well as seroquel 50 mg po tid prn for severe anxiety/agitation. Will discontinue Ambien  5 mg po qhs since pt is on a higher dose of seroquel.  Will discontinue Cymbalta 30 mg since he is on pain medications as well as Luvox. Will increase Luvox to 25 mg po daily and 75 mg po qhs for affective sx, titrate higher as needed. Discontinued Trazodone 200 mg po qhs prn - risk of serotonic syndrome on multiple medications. Will continue Gabapentin 800 mg po qid for anxiety/pain. Will continue  Oxycodone IR 10 mg po q6h prn for severe Pain. CSW has obtained pt an appointment with his PCP for pain management referral. Will continue to monitor vitals ,medication compliance and treatment side effects while patient is here.  Will monitor for medical issues as well as call consult as needed. Restart home medications where indicated. Reviewed labs - added Zocor 20 mg po qpm for abnormal lipid panel, hba1c- 9.4, ekg - wnl . Diabetic management - by [facility] /Metformin scheduled.Diabetic consult obtained - will follow recommendations. CSW will continue working on disposition.  Recreational therapy consult. Patient to participate in therapeutic milieu .       Ian Dixson, MD 05/31/2015, 2:50 PM

## 2015-05-31 NOTE — Tx Team (Signed)
Interdisciplinary Treatment Plan Update (Adult)  Date:  05/31/2015  Time Reviewed:  8:10 AM   Progress in Treatment: Attending groups: No. New to unit. Continuing to assess.  Participating in groups:  No. Taking medication as prescribed:  Yes. Tolerating medication:  Yes. Family/Significant othe contact made: No Patient understands diagnosis:  Yes, aeb asking for help with anxiety, pain. Discussing patient identified problems/goals with staff:  Yes. Medical problems stabilized or resolved:  Yes. Denies suicidal/homicidal ideation: No, pt continues to endorse passive SI/able to contract for safety on the unit.  Issues/concerns per patient self-inventory:  Other:  Discharge Plan or Barriers: See below Reason for Continuation of Hospitalization:   Comments:  [name] is an 60 y.o. male presenting to [facility] voluntarily for suicidal ideations with a plan to "overdose on whatever i can get my hands on that will kill me." Patient reports that his most recent stressor is pain and endorses symptoms of depression. Patient states that he has had "several" suicide attempts in the past and the last was "sometime last year" when he attempted to "overdose on drugs" and was treated in [place]. Patient denies HI and history of being violent towards others. Patient denies AVH and does not appear to be responding to internal stimuli.  Patient has a strong body odor and is dressed in scrubs. Patient states that he has had a [facility] for "fve or six years" and states that he last spoke with them prior to coming to the ED. Patient states that he does not use alcohol but states that he will use "pot and anything else that will stop the pain up to heroin." Patient states that he has used THC since the age of 89 and uses it "wheverer I can afford it, like seven grams a week." Patient states that he last use "one or two joints this afternoon." Patient states that he started using opiates one year ago and  usually uses a "$20 bag" and last used two weeks ago. Patient UDS + THC and BAL <5 at time of assessment.  Diagnosis: Major Depressive Disorder, Recurrent, Severe  05/29/15: Pt today presents as irritable and anxious. Will continue Tegretol xr 200 mg po bid for mood lability. Tegretol level -8.9 ( 05/28/15) Will continue seroquel 100 mg po qhs for sleep/mood sx as well as seroquel 50 mg po tid prn for severe anxiety/agitation. Will reduce Ambien to 5 mg po qhs prn for sleep. Will DC once pt is able to tolerate seroquel. Will continue Cymbalta 30 mg po daily - will continue this dose since it helps with pain. Will increase Luvox to 25 mg po daily and 50 mg po qhs for affective sx, titrate higher as needed. Will discontinue Trazodone 200 mg po qhs prn - risk of serotonic syndrome on multiple medications. Will continue Gabapentin 800 mg po qid for anxiety/pain.  05/31/15:  Based on the fact that pt will see PCP next week, he was started on PRN opiates yesterday.  As a result, mood is much better, and he is talking about going home.  Estimated length of stay:  Likely d/c tomorrow  New goal(s):Additional Comments:   Review of initial/current patient goals per problem list:  1. Goal(s): Patient will participate in aftercare plan  Met:Yes  Target date: at discharge  As evidenced by: Patient will participate within aftercare plan AEB aftercare provider and housing plan at discharge being identified.  3/30: Pt reports that he is connected with [facility] for the past  5-6 years. CSW assessing for additional referrals/resources that may be helpful to patient.  05/29/15:  Pt has been referred to [facility] 05/31/15:  Return home, follow up ACT team  2. Goal (s): Patient will exhibit decreased depressive symptoms and suicidal ideations.  Met: Yes    Target date: at discharge  As evidenced by: Patient will utilize self rating of depression at 3 or below and demonstrate decreased signs of depression or be  deemed stable for discharge by MD.  3/30: Pt endorses passive SI/able to contract for safety on the unit. Rates depression as high this morning with depressed mood/lethargic affect.  05/29/15:  Continues to complain of SI with severe depression 05/31/15:  Rates his depression a 3 today  3. Goal(s): Patient will demonstrate decreased signs of withdrawal due to substance abuse  Met:Yes  Target date:at discharge   As evidenced by: Patient will produce a CIWA/COWS score of 0, have stable vitals signs, and no symptoms of withdrawal.  3/30: Pt reports mild withdrawals with COWS of 2 and high Sitting/Standing BP. All other vitals are stable.  05/29/15:  No signs nor symptoms of withdrawal today   Attendees: Patient:   05/31/2015 8:10 AM   Family:   05/31/2015 8:10 AM   Physician:  Ursula Alert  05/31/2015 8:10 AM   Nursing:   Rosetta Posner. RN 05/31/2015 8:10 AM   Clinical Social Worker: Ripley Fraise  05/31/2015 8:10 AM   Clinical Social Worker:  05/31/2015 8:10 AM   Other:  Gerline Legacy Nurse Case Manager 05/31/2015 8:10 AM   Other:  Agustina Caroli NP  05/31/2015 8:10 AM   Other:   05/31/2015 8:10 AM   Other:  05/31/2015 8:10 AM   Other:  05/31/2015 8:10 AM   Other:  05/31/2015 8:10 AM    05/31/2015 8:10 AM    05/31/2015 8:10 AM    05/31/2015 8:10 AM    05/31/2015 8:10 AM    Scribe for Treatment Team:   Ripley Fraise LCSW 05/31/2015 8:10 AM

## 2015-05-31 NOTE — Progress Notes (Signed)
D: Pt presents blunted in affect and depressed in mood. Pt reports having a bad day due to his ongoing medical issues. However, pt was pleased that his CBG was <200 this evening. Pt is currently denying any SI/HI/AVH. Pt verbally contracts for safety. Pt was less present within the milieu tonight.  A: Writer administered scheduled and prn medications to pt, per MD orders. Continued support and availability as needed was extended to this pt. Staff continues to monitor pt with q6815min checks.  R: No adverse drug reactions noted. Pt receptive to treatment. Pt remains safe at this time.

## 2015-05-31 NOTE — Progress Notes (Signed)
Recreation Therapy Notes  04.05.2017 LRT returned to work with patient 1:1. Patient reported he practiced the mindful breathing introduced in previous 1:1 session. Patient reported it ws mildly effective. LRT offered patient education and practice of progressive muscle relaxation. Patient agreed to participate. LRT instructed patient on how to practice progressive muscle relaxation, patient actively engaged with LRT and reported he felt it was helpful at making him feel relaxed. Patient encouraged to practice independently, patient agreeable.   Marykay Lexenise L Mannie Wineland, LRT/CTRS   Mykiah Schmuck L 05/31/2015 4:25 PM

## 2015-06-01 DIAGNOSIS — F332 Major depressive disorder, recurrent severe without psychotic features: Secondary | ICD-10-CM | POA: Insufficient documentation

## 2015-06-01 LAB — GLUCOSE, CAPILLARY
GLUCOSE-CAPILLARY: 299 mg/dL — AB (ref 65–99)
Glucose-Capillary: 180 mg/dL — ABNORMAL HIGH (ref 65–99)

## 2015-06-01 MED ORDER — OXYCODONE HCL 10 MG PO TABS: 10.0000 mg | ORAL_TABLET | Freq: Four times a day (QID) | ORAL | Status: DC | PRN

## 2015-06-01 MED ORDER — FLUVOXAMINE MALEATE 25 MG PO TABS: 25.0000 mg | ORAL_TABLET | Freq: Every day | ORAL | Status: DC

## 2015-06-01 MED ORDER — FLUVOXAMINE MALEATE 25 MG PO TABS: 75.0000 mg | ORAL_TABLET | Freq: Every day | ORAL | Status: DC

## 2015-06-01 MED ORDER — DOCUSATE SODIUM 100 MG PO CAPS
100.0000 mg | ORAL_CAPSULE | Freq: Two times a day (BID) | ORAL | Status: DC
  Administered 2015-06-01: 100 mg via ORAL
  Filled 2015-06-01 (×5): qty 1

## 2015-06-01 MED ORDER — SIMVASTATIN 20 MG PO TABS: 20.0000 mg | ORAL_TABLET | Freq: Every day | ORAL | Status: DC

## 2015-06-01 MED ORDER — AMLODIPINE BESYLATE 10 MG PO TABS: 10.0000 mg | ORAL_TABLET | Freq: Every day | ORAL | Status: DC

## 2015-06-01 MED ORDER — GABAPENTIN 400 MG PO CAPS: 800.0000 mg | ORAL_CAPSULE | Freq: Four times a day (QID) | ORAL | Status: DC

## 2015-06-01 MED ORDER — INSULIN ASPART 100 UNIT/ML ~~LOC~~ SOLN: 4.0000 [IU] | Freq: Three times a day (TID) | SUBCUTANEOUS | Status: DC

## 2015-06-01 MED ORDER — INSULIN GLARGINE 100 UNIT/ML ~~LOC~~ SOLN: 15.0000 [IU] | Freq: Every day | SUBCUTANEOUS | Status: DC

## 2015-06-01 MED ORDER — METFORMIN HCL 500 MG PO TABS: 500.0000 mg | ORAL_TABLET | Freq: Two times a day (BID) | ORAL | Status: DC

## 2015-06-01 MED ORDER — DOCUSATE SODIUM 100 MG PO CAPS: 100.0000 mg | ORAL_CAPSULE | Freq: Two times a day (BID) | ORAL | Status: DC

## 2015-06-01 MED ORDER — CARBAMAZEPINE ER 200 MG PO TB12: 200.0000 mg | ORAL_TABLET | Freq: Two times a day (BID) | ORAL | Status: DC

## 2015-06-01 MED ORDER — QUETIAPINE FUMARATE 200 MG PO TABS: 200.0000 mg | ORAL_TABLET | Freq: Every day | ORAL | Status: DC

## 2015-06-01 MED ORDER — CLONIDINE HCL 0.1 MG PO TABS: 0.1000 mg | ORAL_TABLET | Freq: Three times a day (TID) | ORAL | Status: DC

## 2015-06-01 MED ORDER — NICOTINE 21 MG/24HR TD PT24: 21.0000 mg | MEDICATED_PATCH | Freq: Every day | TRANSDERMAL | Status: DC

## 2015-06-01 NOTE — Plan of Care (Signed)
Problem: Alteration in mood; excessive anxiety as evidenced by: Goal: STG-Pt will report an absence of self-harm thoughts/actions (Patient will report an absence of self-harm thoughts or actions)  Outcome: Progressing Pt denies any SI. Pt verbally contracts for safety.

## 2015-06-01 NOTE — [facility] Group Notes (Signed)
Patient did not attend group.

## 2015-06-01 NOTE — Progress Notes (Signed)
  [facility] :  Will you be returning to the same living situation after discharge:  Yes,  home At discharge, do you have transportation home?: Yes,  ACT team Do you have the ability to pay for your medications: Yes,  MCD  Release of information consent forms completed and in the chart;  Patient's signature needed at discharge.  Patient to Follow up at: Follow-up Information    Follow up with [facility].   Why:  Someone will pick you up the day of d/c.   Contact information:   [address]  [place]  [phone]      [facility].   Why:  Wednesday at 11:00 with Cletis AthensMelissa Rose   Contact information:   [address]  [place] [phone]      Next level of care provider [facility]:no  Safety Planning and Suicide Prevention discussed: Yes,  yes  Have you used any form of tobacco in the last 30 days? (Cigarettes, Smokeless Tobacco, Cigars, and/or Pipes): Yes  Has patient been referred to the Quitline?: Patient refused referral  Patient has been referred for addiction treatment: Yes  Daryel Geraldorth, Chery Giusto B 06/01/2015, 10:29 AM

## 2015-06-01 NOTE — Progress Notes (Signed)
Recreation Therapy Notes  04.06.2017 approximately 8:20am. LRT returned with literature and resources describing mindful breathing and progressive muscle relaxation. Information additionally including deep breathing and diaphragmatic breathing. LRT also provided [place] resources [place] lakes. Information was provided due to patient expressing interest in fishing. Patient receptive to resources and stated he would use them post d/c.   Marykay Lexenise L Vergie Zahm, LRT/CTRS   Donnis Pecha L 06/01/2015 1:51 PM

## 2015-06-01 NOTE — Progress Notes (Signed)
Patient ID: [name], male   DOB: [birth year], 60 y.o.   MRN: [id number] PER STATE REGULATIONS 482.30  THIS CHART WAS REVIEWED FOR MEDICAL NECESSITY WITH RESPECT TO THE PATIENT'S ADMISSION/ DURATION OF STAY.  NEXT REVIEW DATE:   06/04/2015  Willa RoughJENNIFER JONES Axzel [place], RN, BSN CASE MANAGER
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
## 2015-06-01 NOTE — Progress Notes (Signed)
Jena GaussHugh was D/C from the unit to lobby accompanied by [facility] Staff.  He was pleasant and cooperative. He voiced no SI/HI or A/V halluciations.  He denies any pain or discomfort.  D/C instructions and medications reviewed with pt.  Pt. verbalized understanding of medications and d/c instructions.   All belongings (from locker 8-black belt, blue lighter, black string, brown wallet, NCDL and VISA) returned to pt. Q 15 min checks maintained until discharge.  Pt. Left the unit in no apparent distress.

## 2015-06-01 NOTE — [facility] Suicide Risk Assessment (Signed)
[facility]   Principal Problem: MDD (major depressive disorder), recurrent episode, severe (HCC) Discharge Diagnoses:  Patient Active Problem List   Diagnosis Date Noted  . Opioid use disorder, severe, in sustained remission [F11.90] 05/25/2015  . Benzodiazepine abuse [F13.10] 05/25/2015  . Cannabis use disorder, moderate, dependence (HCC) [F12.20] 05/25/2015  . MDD (major depressive disorder), recurrent episode, severe (HCC) [F33.2] 05/23/2015  . Chronic pain syndrome [G89.4] 05/23/2015  . GAD (generalized anxiety disorder) [F41.1] 05/23/2015  . CVA (cerebral infarction) [I63.9] 10/15/2013  . Hypertension [I10] 10/15/2013  . Diabetes mellitus (HCC) [E11.9] 10/15/2013    Total Time spent with patient: 30 minutes  Musculoskeletal: Strength & Muscle Tone: within normal limits Gait & Station: normal Patient leans: N/A  Psychiatric Specialty Exam: Review of Systems  Musculoskeletal: Positive for myalgias and back pain.  Psychiatric/Behavioral: Negative for depression and suicidal ideas. The patient is not nervous/anxious.   All other systems reviewed and are negative.   Blood pressure 145/92, pulse 105, temperature 98 F (36.7 C), temperature source Oral, resp. rate 18, height 5\' 11"  (1.803 m), weight 102.967 kg (227 lb), SpO2 99 %.Body mass index is 31.67 kg/(m^2).  General Appearance: Casual  Eye Contact::  Fair  Speech:  Clear and Coherent409  Volume:  Normal  Mood:  Euthymic  Affect:  Congruent  Thought Process:  Goal Directed  Orientation:  Full (Time, Place, and Person)  Thought Content:  WDL  Suicidal Thoughts:  No  Homicidal Thoughts:  No  Memory:  Immediate;   Fair Recent;   Fair Remote;   Fair  Judgement:  Fair  Insight:  Fair  Psychomotor Activity:  Normal  Concentration:  Fair  Recall:  FiservFair  Fund of Knowledge:Fair  Language: Fair  Akathisia:  No  Handed:  Right  AIMS (if indicated):   0  Assets:  Desire for Improvement   Sleep:  Number of Hours: 6.25  Cognition: WNL  ADL's:  Intact   Mental Status Per Nursing Assessment::   On Admission:  Suicidal ideation indicated by patient  Demographic Factors:  Male and Caucasian  Loss Factors: Decline in physical health  Historical Factors: Impulsivity  Risk Reduction Factors:   Positive social support and Positive therapeutic relationship  Continued Clinical Symptoms:  Previous Psychiatric Diagnoses and Treatments Medical Diagnoses and Treatments/Surgeries  Cognitive Features That Contribute To Risk:  Polarized thinking    Suicide Risk:  Minimal: No identifiable suicidal ideation.  Patients presenting with no risk factors but with morbid ruminations; may be classified as minimal risk based on the severity of the depressive symptoms  Follow-up Information    Follow up with [facility].   Why:  Someone will pick you up the day of d/c.   Contact information:   [address]  [place]  [phone]      [facility]   Why:  Wednesday at 11:00 with Cletis AthensMelissa Rose   Contact information:   [address]  [place] [[phone]] [phone]      Plan Of Care/Follow-up recommendations:  Activity:  no restrictions Diet:  carb modified Tests:  as needed Other:  follow up with aftercare  Felton Buczynski, MD 06/01/2015, 8:40 AM

## 2016-03-05 DIAGNOSIS — M25562 Pain in left knee: Secondary | ICD-10-CM | POA: Diagnosis not present

## 2016-03-05 DIAGNOSIS — G894 Chronic pain syndrome: Secondary | ICD-10-CM | POA: Diagnosis not present

## 2016-03-05 DIAGNOSIS — Z79899 Other long term (current) drug therapy: Secondary | ICD-10-CM | POA: Diagnosis not present

## 2016-03-05 DIAGNOSIS — I10 Essential (primary) hypertension: Secondary | ICD-10-CM | POA: Diagnosis not present

## 2016-03-05 DIAGNOSIS — E785 Hyperlipidemia, unspecified: Secondary | ICD-10-CM | POA: Diagnosis not present

## 2016-03-05 DIAGNOSIS — E119 Type 2 diabetes mellitus without complications: Secondary | ICD-10-CM | POA: Diagnosis not present

## 2016-06-20 DIAGNOSIS — I10 Essential (primary) hypertension: Secondary | ICD-10-CM | POA: Diagnosis not present

## 2016-06-20 DIAGNOSIS — E785 Hyperlipidemia, unspecified: Secondary | ICD-10-CM | POA: Diagnosis not present

## 2016-06-20 DIAGNOSIS — G894 Chronic pain syndrome: Secondary | ICD-10-CM | POA: Diagnosis not present

## 2016-06-20 DIAGNOSIS — E559 Vitamin D deficiency, unspecified: Secondary | ICD-10-CM | POA: Diagnosis not present

## 2016-06-20 DIAGNOSIS — E1165 Type 2 diabetes mellitus with hyperglycemia: Secondary | ICD-10-CM | POA: Diagnosis not present

## 2016-06-20 DIAGNOSIS — F3181 Bipolar II disorder: Secondary | ICD-10-CM | POA: Diagnosis not present

## 2016-11-07 DIAGNOSIS — G92 Toxic encephalopathy: Secondary | ICD-10-CM | POA: Diagnosis not present

## 2016-11-07 DIAGNOSIS — J449 Chronic obstructive pulmonary disease, unspecified: Secondary | ICD-10-CM | POA: Diagnosis present

## 2016-11-07 DIAGNOSIS — K219 Gastro-esophageal reflux disease without esophagitis: Secondary | ICD-10-CM | POA: Diagnosis present

## 2016-11-07 DIAGNOSIS — F1910 Other psychoactive substance abuse, uncomplicated: Secondary | ICD-10-CM

## 2016-11-07 DIAGNOSIS — F418 Other specified anxiety disorders: Secondary | ICD-10-CM | POA: Diagnosis present

## 2016-11-07 DIAGNOSIS — F329 Major depressive disorder, single episode, unspecified: Secondary | ICD-10-CM

## 2016-11-07 DIAGNOSIS — Z79899 Other long term (current) drug therapy: Secondary | ICD-10-CM | POA: Diagnosis not present

## 2016-11-07 DIAGNOSIS — Z951 Presence of aortocoronary bypass graft: Secondary | ICD-10-CM | POA: Diagnosis not present

## 2016-11-07 DIAGNOSIS — M1990 Unspecified osteoarthritis, unspecified site: Secondary | ICD-10-CM | POA: Diagnosis present

## 2016-11-07 DIAGNOSIS — E875 Hyperkalemia: Secondary | ICD-10-CM | POA: Diagnosis not present

## 2016-11-07 DIAGNOSIS — F17210 Nicotine dependence, cigarettes, uncomplicated: Secondary | ICD-10-CM | POA: Diagnosis present

## 2016-11-07 DIAGNOSIS — I25810 Atherosclerosis of coronary artery bypass graft(s) without angina pectoris: Secondary | ICD-10-CM | POA: Diagnosis not present

## 2016-11-07 DIAGNOSIS — I472 Ventricular tachycardia: Secondary | ICD-10-CM | POA: Diagnosis not present

## 2016-11-07 DIAGNOSIS — R069 Unspecified abnormalities of breathing: Secondary | ICD-10-CM | POA: Diagnosis not present

## 2016-11-07 DIAGNOSIS — I252 Old myocardial infarction: Secondary | ICD-10-CM | POA: Diagnosis not present

## 2016-11-07 DIAGNOSIS — R079 Chest pain, unspecified: Secondary | ICD-10-CM | POA: Diagnosis not present

## 2016-11-07 DIAGNOSIS — Z7982 Long term (current) use of aspirin: Secondary | ICD-10-CM | POA: Diagnosis not present

## 2016-11-07 DIAGNOSIS — E1165 Type 2 diabetes mellitus with hyperglycemia: Secondary | ICD-10-CM | POA: Diagnosis present

## 2016-11-07 DIAGNOSIS — T40601A Poisoning by unspecified narcotics, accidental (unintentional), initial encounter: Secondary | ICD-10-CM | POA: Diagnosis not present

## 2016-11-07 DIAGNOSIS — R4182 Altered mental status, unspecified: Secondary | ICD-10-CM | POA: Diagnosis not present

## 2016-11-07 DIAGNOSIS — E872 Acidosis: Secondary | ICD-10-CM | POA: Diagnosis present

## 2016-11-07 DIAGNOSIS — Z9119 Patient's noncompliance with other medical treatment and regimen: Secondary | ICD-10-CM | POA: Diagnosis not present

## 2016-11-07 DIAGNOSIS — E7800 Pure hypercholesterolemia, unspecified: Secondary | ICD-10-CM | POA: Diagnosis present

## 2016-11-07 DIAGNOSIS — Z8673 Personal history of transient ischemic attack (TIA), and cerebral infarction without residual deficits: Secondary | ICD-10-CM | POA: Diagnosis not present

## 2016-11-07 DIAGNOSIS — Z888 Allergy status to other drugs, medicaments and biological substances status: Secondary | ICD-10-CM | POA: Diagnosis not present

## 2016-11-08 DIAGNOSIS — I472 Ventricular tachycardia: Secondary | ICD-10-CM | POA: Diagnosis not present

## 2016-11-09 DIAGNOSIS — T40601A Poisoning by unspecified narcotics, accidental (unintentional), initial encounter: Secondary | ICD-10-CM | POA: Diagnosis not present

## 2016-11-09 DIAGNOSIS — F329 Major depressive disorder, single episode, unspecified: Secondary | ICD-10-CM | POA: Diagnosis not present

## 2016-11-09 DIAGNOSIS — I25810 Atherosclerosis of coronary artery bypass graft(s) without angina pectoris: Secondary | ICD-10-CM | POA: Diagnosis not present

## 2016-11-09 DIAGNOSIS — Z9119 Patient's noncompliance with other medical treatment and regimen: Secondary | ICD-10-CM | POA: Diagnosis not present

## 2017-05-16 DIAGNOSIS — I82411 Acute embolism and thrombosis of right femoral vein: Secondary | ICD-10-CM | POA: Diagnosis not present

## 2017-05-16 DIAGNOSIS — M25572 Pain in left ankle and joints of left foot: Secondary | ICD-10-CM | POA: Diagnosis not present

## 2017-05-16 DIAGNOSIS — R262 Difficulty in walking, not elsewhere classified: Secondary | ICD-10-CM | POA: Diagnosis not present

## 2017-05-16 DIAGNOSIS — M79672 Pain in left foot: Secondary | ICD-10-CM | POA: Diagnosis not present

## 2017-05-16 DIAGNOSIS — E785 Hyperlipidemia, unspecified: Secondary | ICD-10-CM | POA: Diagnosis present

## 2017-05-16 DIAGNOSIS — I252 Old myocardial infarction: Secondary | ICD-10-CM | POA: Diagnosis not present

## 2017-05-16 DIAGNOSIS — Z9114 Patient's other noncompliance with medication regimen: Secondary | ICD-10-CM | POA: Diagnosis not present

## 2017-05-16 DIAGNOSIS — R2689 Other abnormalities of gait and mobility: Secondary | ICD-10-CM | POA: Diagnosis not present

## 2017-05-16 DIAGNOSIS — K5900 Constipation, unspecified: Secondary | ICD-10-CM | POA: Diagnosis not present

## 2017-05-16 DIAGNOSIS — I998 Other disorder of circulatory system: Secondary | ICD-10-CM | POA: Diagnosis not present

## 2017-05-16 DIAGNOSIS — G609 Hereditary and idiopathic neuropathy, unspecified: Secondary | ICD-10-CM | POA: Diagnosis not present

## 2017-05-16 DIAGNOSIS — I75021 Atheroembolism of right lower extremity: Secondary | ICD-10-CM | POA: Diagnosis not present

## 2017-05-16 DIAGNOSIS — Z7982 Long term (current) use of aspirin: Secondary | ICD-10-CM | POA: Diagnosis not present

## 2017-05-16 DIAGNOSIS — I708 Atherosclerosis of other arteries: Secondary | ICD-10-CM | POA: Diagnosis not present

## 2017-05-16 DIAGNOSIS — M6281 Muscle weakness (generalized): Secondary | ICD-10-CM | POA: Diagnosis not present

## 2017-05-16 DIAGNOSIS — Z9181 History of falling: Secondary | ICD-10-CM | POA: Diagnosis not present

## 2017-05-16 DIAGNOSIS — Z7409 Other reduced mobility: Secondary | ICD-10-CM | POA: Diagnosis not present

## 2017-05-16 DIAGNOSIS — I10 Essential (primary) hypertension: Secondary | ICD-10-CM | POA: Diagnosis present

## 2017-05-16 DIAGNOSIS — I2510 Atherosclerotic heart disease of native coronary artery without angina pectoris: Secondary | ICD-10-CM | POA: Diagnosis present

## 2017-05-16 DIAGNOSIS — Z720 Tobacco use: Secondary | ICD-10-CM | POA: Diagnosis not present

## 2017-05-16 DIAGNOSIS — I771 Stricture of artery: Secondary | ICD-10-CM | POA: Diagnosis not present

## 2017-05-16 DIAGNOSIS — E1151 Type 2 diabetes mellitus with diabetic peripheral angiopathy without gangrene: Secondary | ICD-10-CM | POA: Diagnosis not present

## 2017-05-16 DIAGNOSIS — I70293 Other atherosclerosis of native arteries of extremities, bilateral legs: Secondary | ICD-10-CM | POA: Diagnosis not present

## 2017-05-16 DIAGNOSIS — I2699 Other pulmonary embolism without acute cor pulmonale: Secondary | ICD-10-CM | POA: Diagnosis not present

## 2017-05-16 DIAGNOSIS — I871 Compression of vein: Secondary | ICD-10-CM | POA: Diagnosis not present

## 2017-05-16 DIAGNOSIS — Z951 Presence of aortocoronary bypass graft: Secondary | ICD-10-CM | POA: Diagnosis not present

## 2017-05-16 DIAGNOSIS — I75022 Atheroembolism of left lower extremity: Secondary | ICD-10-CM | POA: Diagnosis not present

## 2017-05-16 DIAGNOSIS — R911 Solitary pulmonary nodule: Secondary | ICD-10-CM | POA: Diagnosis not present

## 2017-05-16 DIAGNOSIS — F17200 Nicotine dependence, unspecified, uncomplicated: Secondary | ICD-10-CM | POA: Diagnosis not present

## 2017-05-16 DIAGNOSIS — I82432 Acute embolism and thrombosis of left popliteal vein: Secondary | ICD-10-CM | POA: Diagnosis not present

## 2017-05-16 DIAGNOSIS — I2609 Other pulmonary embolism with acute cor pulmonale: Secondary | ICD-10-CM | POA: Diagnosis not present

## 2017-05-16 DIAGNOSIS — J449 Chronic obstructive pulmonary disease, unspecified: Secondary | ICD-10-CM | POA: Diagnosis not present

## 2017-05-16 DIAGNOSIS — E11641 Type 2 diabetes mellitus with hypoglycemia with coma: Secondary | ICD-10-CM | POA: Diagnosis not present

## 2017-05-16 DIAGNOSIS — Z794 Long term (current) use of insulin: Secondary | ICD-10-CM | POA: Diagnosis not present

## 2017-05-16 DIAGNOSIS — R9389 Abnormal findings on diagnostic imaging of other specified body structures: Secondary | ICD-10-CM | POA: Diagnosis not present

## 2017-05-16 DIAGNOSIS — G4700 Insomnia, unspecified: Secondary | ICD-10-CM | POA: Diagnosis not present

## 2017-05-16 DIAGNOSIS — I517 Cardiomegaly: Secondary | ICD-10-CM | POA: Diagnosis not present

## 2017-05-16 DIAGNOSIS — E119 Type 2 diabetes mellitus without complications: Secondary | ICD-10-CM | POA: Diagnosis not present

## 2017-05-16 DIAGNOSIS — E1165 Type 2 diabetes mellitus with hyperglycemia: Secondary | ICD-10-CM | POA: Diagnosis present

## 2017-05-16 DIAGNOSIS — R739 Hyperglycemia, unspecified: Secondary | ICD-10-CM | POA: Diagnosis not present

## 2017-05-16 DIAGNOSIS — I7090 Unspecified atherosclerosis: Secondary | ICD-10-CM | POA: Diagnosis not present

## 2017-05-16 DIAGNOSIS — M79675 Pain in left toe(s): Secondary | ICD-10-CM | POA: Diagnosis not present

## 2017-05-16 DIAGNOSIS — I739 Peripheral vascular disease, unspecified: Secondary | ICD-10-CM | POA: Diagnosis not present

## 2017-05-16 DIAGNOSIS — F17210 Nicotine dependence, cigarettes, uncomplicated: Secondary | ICD-10-CM | POA: Diagnosis not present

## 2017-05-20 DIAGNOSIS — R262 Difficulty in walking, not elsewhere classified: Secondary | ICD-10-CM | POA: Diagnosis not present

## 2017-05-20 DIAGNOSIS — Z7409 Other reduced mobility: Secondary | ICD-10-CM | POA: Diagnosis not present

## 2017-05-20 DIAGNOSIS — E1151 Type 2 diabetes mellitus with diabetic peripheral angiopathy without gangrene: Secondary | ICD-10-CM | POA: Diagnosis not present

## 2017-05-20 DIAGNOSIS — E1165 Type 2 diabetes mellitus with hyperglycemia: Secondary | ICD-10-CM | POA: Diagnosis not present

## 2017-05-20 DIAGNOSIS — I10 Essential (primary) hypertension: Secondary | ICD-10-CM | POA: Diagnosis not present

## 2017-05-20 DIAGNOSIS — I771 Stricture of artery: Secondary | ICD-10-CM | POA: Diagnosis not present

## 2017-05-20 DIAGNOSIS — E785 Hyperlipidemia, unspecified: Secondary | ICD-10-CM | POA: Diagnosis not present

## 2017-05-20 DIAGNOSIS — I7090 Unspecified atherosclerosis: Secondary | ICD-10-CM | POA: Diagnosis not present

## 2017-05-20 DIAGNOSIS — Z720 Tobacco use: Secondary | ICD-10-CM | POA: Diagnosis not present

## 2017-05-20 DIAGNOSIS — E119 Type 2 diabetes mellitus without complications: Secondary | ICD-10-CM | POA: Diagnosis not present

## 2017-05-20 DIAGNOSIS — E11641 Type 2 diabetes mellitus with hypoglycemia with coma: Secondary | ICD-10-CM | POA: Diagnosis not present

## 2017-05-20 DIAGNOSIS — I2699 Other pulmonary embolism without acute cor pulmonale: Secondary | ICD-10-CM | POA: Diagnosis not present

## 2017-05-20 DIAGNOSIS — R739 Hyperglycemia, unspecified: Secondary | ICD-10-CM | POA: Diagnosis not present

## 2017-05-20 DIAGNOSIS — K5900 Constipation, unspecified: Secondary | ICD-10-CM | POA: Diagnosis not present

## 2017-05-20 DIAGNOSIS — Z9181 History of falling: Secondary | ICD-10-CM | POA: Diagnosis not present

## 2017-05-20 DIAGNOSIS — R2689 Other abnormalities of gait and mobility: Secondary | ICD-10-CM | POA: Diagnosis not present

## 2017-05-20 DIAGNOSIS — I75022 Atheroembolism of left lower extremity: Secondary | ICD-10-CM | POA: Diagnosis not present

## 2017-05-20 DIAGNOSIS — M6281 Muscle weakness (generalized): Secondary | ICD-10-CM | POA: Diagnosis not present

## 2017-05-20 DIAGNOSIS — F17210 Nicotine dependence, cigarettes, uncomplicated: Secondary | ICD-10-CM | POA: Diagnosis not present

## 2017-05-20 DIAGNOSIS — G4700 Insomnia, unspecified: Secondary | ICD-10-CM | POA: Diagnosis not present

## 2017-05-20 DIAGNOSIS — I739 Peripheral vascular disease, unspecified: Secondary | ICD-10-CM | POA: Diagnosis not present

## 2017-05-20 DIAGNOSIS — R911 Solitary pulmonary nodule: Secondary | ICD-10-CM | POA: Diagnosis not present

## 2017-05-22 DIAGNOSIS — I739 Peripheral vascular disease, unspecified: Secondary | ICD-10-CM | POA: Diagnosis not present

## 2017-05-22 DIAGNOSIS — R262 Difficulty in walking, not elsewhere classified: Secondary | ICD-10-CM | POA: Diagnosis not present

## 2017-05-22 DIAGNOSIS — I2699 Other pulmonary embolism without acute cor pulmonale: Secondary | ICD-10-CM | POA: Diagnosis not present

## 2017-05-22 DIAGNOSIS — E1151 Type 2 diabetes mellitus with diabetic peripheral angiopathy without gangrene: Secondary | ICD-10-CM | POA: Diagnosis not present

## 2017-05-31 DIAGNOSIS — J449 Chronic obstructive pulmonary disease, unspecified: Secondary | ICD-10-CM | POA: Diagnosis not present

## 2017-05-31 DIAGNOSIS — I129 Hypertensive chronic kidney disease with stage 1 through stage 4 chronic kidney disease, or unspecified chronic kidney disease: Secondary | ICD-10-CM | POA: Diagnosis not present

## 2017-05-31 DIAGNOSIS — Z794 Long term (current) use of insulin: Secondary | ICD-10-CM | POA: Diagnosis not present

## 2017-05-31 DIAGNOSIS — I2699 Other pulmonary embolism without acute cor pulmonale: Secondary | ICD-10-CM | POA: Diagnosis not present

## 2017-05-31 DIAGNOSIS — E1142 Type 2 diabetes mellitus with diabetic polyneuropathy: Secondary | ICD-10-CM | POA: Diagnosis not present

## 2017-05-31 DIAGNOSIS — I2510 Atherosclerotic heart disease of native coronary artery without angina pectoris: Secondary | ICD-10-CM | POA: Diagnosis not present

## 2017-05-31 DIAGNOSIS — R911 Solitary pulmonary nodule: Secondary | ICD-10-CM | POA: Diagnosis not present

## 2017-05-31 DIAGNOSIS — Z7901 Long term (current) use of anticoagulants: Secondary | ICD-10-CM | POA: Diagnosis not present

## 2017-05-31 DIAGNOSIS — I75022 Atheroembolism of left lower extremity: Secondary | ICD-10-CM | POA: Diagnosis not present

## 2017-05-31 DIAGNOSIS — E1122 Type 2 diabetes mellitus with diabetic chronic kidney disease: Secondary | ICD-10-CM | POA: Diagnosis not present

## 2017-05-31 DIAGNOSIS — E1151 Type 2 diabetes mellitus with diabetic peripheral angiopathy without gangrene: Secondary | ICD-10-CM | POA: Diagnosis not present

## 2017-05-31 DIAGNOSIS — F17210 Nicotine dependence, cigarettes, uncomplicated: Secondary | ICD-10-CM | POA: Diagnosis not present

## 2017-05-31 DIAGNOSIS — N189 Chronic kidney disease, unspecified: Secondary | ICD-10-CM | POA: Diagnosis not present

## 2017-06-01 DIAGNOSIS — I2699 Other pulmonary embolism without acute cor pulmonale: Secondary | ICD-10-CM | POA: Diagnosis not present

## 2017-06-01 DIAGNOSIS — E1142 Type 2 diabetes mellitus with diabetic polyneuropathy: Secondary | ICD-10-CM | POA: Diagnosis not present

## 2017-06-01 DIAGNOSIS — E1122 Type 2 diabetes mellitus with diabetic chronic kidney disease: Secondary | ICD-10-CM | POA: Diagnosis not present

## 2017-06-01 DIAGNOSIS — I75022 Atheroembolism of left lower extremity: Secondary | ICD-10-CM | POA: Diagnosis not present

## 2017-06-01 DIAGNOSIS — E1151 Type 2 diabetes mellitus with diabetic peripheral angiopathy without gangrene: Secondary | ICD-10-CM | POA: Diagnosis not present

## 2017-06-01 DIAGNOSIS — R911 Solitary pulmonary nodule: Secondary | ICD-10-CM | POA: Diagnosis not present

## 2017-06-02 DIAGNOSIS — R911 Solitary pulmonary nodule: Secondary | ICD-10-CM | POA: Diagnosis not present

## 2017-06-02 DIAGNOSIS — E1142 Type 2 diabetes mellitus with diabetic polyneuropathy: Secondary | ICD-10-CM | POA: Diagnosis not present

## 2017-06-02 DIAGNOSIS — I2699 Other pulmonary embolism without acute cor pulmonale: Secondary | ICD-10-CM | POA: Diagnosis not present

## 2017-06-02 DIAGNOSIS — E1151 Type 2 diabetes mellitus with diabetic peripheral angiopathy without gangrene: Secondary | ICD-10-CM | POA: Diagnosis not present

## 2017-06-02 DIAGNOSIS — E1122 Type 2 diabetes mellitus with diabetic chronic kidney disease: Secondary | ICD-10-CM | POA: Diagnosis not present

## 2017-06-02 DIAGNOSIS — I75022 Atheroembolism of left lower extremity: Secondary | ICD-10-CM | POA: Diagnosis not present

## 2017-06-05 DIAGNOSIS — Z7982 Long term (current) use of aspirin: Secondary | ICD-10-CM | POA: Diagnosis not present

## 2017-06-05 DIAGNOSIS — I70203 Unspecified atherosclerosis of native arteries of extremities, bilateral legs: Secondary | ICD-10-CM | POA: Diagnosis not present

## 2017-06-05 DIAGNOSIS — I82403 Acute embolism and thrombosis of unspecified deep veins of lower extremity, bilateral: Secondary | ICD-10-CM | POA: Diagnosis not present

## 2017-06-05 DIAGNOSIS — I2699 Other pulmonary embolism without acute cor pulmonale: Secondary | ICD-10-CM | POA: Diagnosis not present

## 2017-06-05 DIAGNOSIS — I75022 Atheroembolism of left lower extremity: Secondary | ICD-10-CM | POA: Diagnosis not present

## 2017-06-05 DIAGNOSIS — R230 Cyanosis: Secondary | ICD-10-CM | POA: Diagnosis not present

## 2017-06-05 DIAGNOSIS — Z7901 Long term (current) use of anticoagulants: Secondary | ICD-10-CM | POA: Diagnosis not present

## 2017-06-09 DIAGNOSIS — E119 Type 2 diabetes mellitus without complications: Secondary | ICD-10-CM | POA: Diagnosis not present

## 2017-06-09 DIAGNOSIS — I10 Essential (primary) hypertension: Secondary | ICD-10-CM | POA: Diagnosis not present

## 2017-06-09 DIAGNOSIS — E785 Hyperlipidemia, unspecified: Secondary | ICD-10-CM | POA: Diagnosis not present

## 2017-06-09 DIAGNOSIS — F331 Major depressive disorder, recurrent, moderate: Secondary | ICD-10-CM | POA: Diagnosis not present

## 2017-06-09 DIAGNOSIS — M79672 Pain in left foot: Secondary | ICD-10-CM | POA: Diagnosis not present

## 2017-06-22 DIAGNOSIS — E1122 Type 2 diabetes mellitus with diabetic chronic kidney disease: Secondary | ICD-10-CM | POA: Diagnosis not present

## 2017-06-22 DIAGNOSIS — R911 Solitary pulmonary nodule: Secondary | ICD-10-CM | POA: Diagnosis not present

## 2017-06-22 DIAGNOSIS — E1142 Type 2 diabetes mellitus with diabetic polyneuropathy: Secondary | ICD-10-CM | POA: Diagnosis not present

## 2017-06-22 DIAGNOSIS — E1151 Type 2 diabetes mellitus with diabetic peripheral angiopathy without gangrene: Secondary | ICD-10-CM | POA: Diagnosis not present

## 2017-06-22 DIAGNOSIS — I2699 Other pulmonary embolism without acute cor pulmonale: Secondary | ICD-10-CM | POA: Diagnosis not present

## 2017-06-22 DIAGNOSIS — I75022 Atheroembolism of left lower extremity: Secondary | ICD-10-CM | POA: Diagnosis not present

## 2017-06-25 DIAGNOSIS — E1142 Type 2 diabetes mellitus with diabetic polyneuropathy: Secondary | ICD-10-CM | POA: Diagnosis not present

## 2017-06-25 DIAGNOSIS — E1151 Type 2 diabetes mellitus with diabetic peripheral angiopathy without gangrene: Secondary | ICD-10-CM | POA: Diagnosis not present

## 2017-06-25 DIAGNOSIS — R911 Solitary pulmonary nodule: Secondary | ICD-10-CM | POA: Diagnosis not present

## 2017-06-25 DIAGNOSIS — I75022 Atheroembolism of left lower extremity: Secondary | ICD-10-CM | POA: Diagnosis not present

## 2017-06-25 DIAGNOSIS — E1122 Type 2 diabetes mellitus with diabetic chronic kidney disease: Secondary | ICD-10-CM | POA: Diagnosis not present

## 2017-06-25 DIAGNOSIS — I2699 Other pulmonary embolism without acute cor pulmonale: Secondary | ICD-10-CM | POA: Diagnosis not present

## 2017-06-29 DIAGNOSIS — I75022 Atheroembolism of left lower extremity: Secondary | ICD-10-CM | POA: Diagnosis not present

## 2017-06-29 DIAGNOSIS — R911 Solitary pulmonary nodule: Secondary | ICD-10-CM | POA: Diagnosis not present

## 2017-06-29 DIAGNOSIS — E1142 Type 2 diabetes mellitus with diabetic polyneuropathy: Secondary | ICD-10-CM | POA: Diagnosis not present

## 2017-06-29 DIAGNOSIS — E1122 Type 2 diabetes mellitus with diabetic chronic kidney disease: Secondary | ICD-10-CM | POA: Diagnosis not present

## 2017-06-29 DIAGNOSIS — E1151 Type 2 diabetes mellitus with diabetic peripheral angiopathy without gangrene: Secondary | ICD-10-CM | POA: Diagnosis not present

## 2017-06-29 DIAGNOSIS — I2699 Other pulmonary embolism without acute cor pulmonale: Secondary | ICD-10-CM | POA: Diagnosis not present

## 2017-07-01 DIAGNOSIS — I75022 Atheroembolism of left lower extremity: Secondary | ICD-10-CM | POA: Diagnosis not present

## 2017-07-01 DIAGNOSIS — E1122 Type 2 diabetes mellitus with diabetic chronic kidney disease: Secondary | ICD-10-CM | POA: Diagnosis not present

## 2017-07-01 DIAGNOSIS — R911 Solitary pulmonary nodule: Secondary | ICD-10-CM | POA: Diagnosis not present

## 2017-07-01 DIAGNOSIS — E1142 Type 2 diabetes mellitus with diabetic polyneuropathy: Secondary | ICD-10-CM | POA: Diagnosis not present

## 2017-07-01 DIAGNOSIS — I2699 Other pulmonary embolism without acute cor pulmonale: Secondary | ICD-10-CM | POA: Diagnosis not present

## 2017-07-01 DIAGNOSIS — E1151 Type 2 diabetes mellitus with diabetic peripheral angiopathy without gangrene: Secondary | ICD-10-CM | POA: Diagnosis not present

## 2017-07-06 DIAGNOSIS — E1122 Type 2 diabetes mellitus with diabetic chronic kidney disease: Secondary | ICD-10-CM | POA: Diagnosis not present

## 2017-07-06 DIAGNOSIS — R911 Solitary pulmonary nodule: Secondary | ICD-10-CM | POA: Diagnosis not present

## 2017-07-06 DIAGNOSIS — E1151 Type 2 diabetes mellitus with diabetic peripheral angiopathy without gangrene: Secondary | ICD-10-CM | POA: Diagnosis not present

## 2017-07-06 DIAGNOSIS — I2699 Other pulmonary embolism without acute cor pulmonale: Secondary | ICD-10-CM | POA: Diagnosis not present

## 2017-07-06 DIAGNOSIS — I75022 Atheroembolism of left lower extremity: Secondary | ICD-10-CM | POA: Diagnosis not present

## 2017-07-06 DIAGNOSIS — E1142 Type 2 diabetes mellitus with diabetic polyneuropathy: Secondary | ICD-10-CM | POA: Diagnosis not present

## 2017-07-08 DIAGNOSIS — R911 Solitary pulmonary nodule: Secondary | ICD-10-CM | POA: Diagnosis not present

## 2017-07-08 DIAGNOSIS — E1142 Type 2 diabetes mellitus with diabetic polyneuropathy: Secondary | ICD-10-CM | POA: Diagnosis not present

## 2017-07-08 DIAGNOSIS — I2699 Other pulmonary embolism without acute cor pulmonale: Secondary | ICD-10-CM | POA: Diagnosis not present

## 2017-07-08 DIAGNOSIS — I75022 Atheroembolism of left lower extremity: Secondary | ICD-10-CM | POA: Diagnosis not present

## 2017-07-08 DIAGNOSIS — E1122 Type 2 diabetes mellitus with diabetic chronic kidney disease: Secondary | ICD-10-CM | POA: Diagnosis not present

## 2017-07-08 DIAGNOSIS — E1151 Type 2 diabetes mellitus with diabetic peripheral angiopathy without gangrene: Secondary | ICD-10-CM | POA: Diagnosis not present

## 2017-07-14 DIAGNOSIS — I2699 Other pulmonary embolism without acute cor pulmonale: Secondary | ICD-10-CM | POA: Diagnosis not present

## 2017-07-14 DIAGNOSIS — E1122 Type 2 diabetes mellitus with diabetic chronic kidney disease: Secondary | ICD-10-CM | POA: Diagnosis not present

## 2017-07-14 DIAGNOSIS — E1151 Type 2 diabetes mellitus with diabetic peripheral angiopathy without gangrene: Secondary | ICD-10-CM | POA: Diagnosis not present

## 2017-07-14 DIAGNOSIS — E1142 Type 2 diabetes mellitus with diabetic polyneuropathy: Secondary | ICD-10-CM | POA: Diagnosis not present

## 2017-07-14 DIAGNOSIS — I75022 Atheroembolism of left lower extremity: Secondary | ICD-10-CM | POA: Diagnosis not present

## 2017-07-14 DIAGNOSIS — R911 Solitary pulmonary nodule: Secondary | ICD-10-CM | POA: Diagnosis not present

## 2017-09-09 DIAGNOSIS — R0602 Shortness of breath: Secondary | ICD-10-CM | POA: Diagnosis not present

## 2017-09-09 DIAGNOSIS — T50904A Poisoning by unspecified drugs, medicaments and biological substances, undetermined, initial encounter: Secondary | ICD-10-CM | POA: Diagnosis not present

## 2017-09-09 DIAGNOSIS — J9601 Acute respiratory failure with hypoxia: Secondary | ICD-10-CM | POA: Diagnosis not present

## 2017-09-09 DIAGNOSIS — J181 Lobar pneumonia, unspecified organism: Secondary | ICD-10-CM | POA: Diagnosis not present

## 2017-09-09 DIAGNOSIS — T40602A Poisoning by unspecified narcotics, intentional self-harm, initial encounter: Secondary | ICD-10-CM | POA: Diagnosis not present

## 2017-09-09 DIAGNOSIS — R4020 Unspecified coma: Secondary | ICD-10-CM | POA: Diagnosis not present

## 2017-09-09 DIAGNOSIS — R410 Disorientation, unspecified: Secondary | ICD-10-CM | POA: Diagnosis not present

## 2017-09-09 DIAGNOSIS — R404 Transient alteration of awareness: Secondary | ICD-10-CM | POA: Diagnosis not present

## 2017-09-09 DIAGNOSIS — E1165 Type 2 diabetes mellitus with hyperglycemia: Secondary | ICD-10-CM | POA: Diagnosis not present

## 2017-09-09 DIAGNOSIS — T50901A Poisoning by unspecified drugs, medicaments and biological substances, accidental (unintentional), initial encounter: Secondary | ICD-10-CM | POA: Diagnosis not present

## 2017-09-09 DIAGNOSIS — J189 Pneumonia, unspecified organism: Secondary | ICD-10-CM | POA: Diagnosis not present

## 2017-09-09 DIAGNOSIS — N179 Acute kidney failure, unspecified: Secondary | ICD-10-CM | POA: Diagnosis not present

## 2017-09-09 DIAGNOSIS — T40601A Poisoning by unspecified narcotics, accidental (unintentional), initial encounter: Secondary | ICD-10-CM | POA: Diagnosis not present

## 2017-09-10 DIAGNOSIS — N179 Acute kidney failure, unspecified: Secondary | ICD-10-CM | POA: Diagnosis not present

## 2017-09-10 DIAGNOSIS — J189 Pneumonia, unspecified organism: Secondary | ICD-10-CM | POA: Diagnosis not present

## 2017-09-10 DIAGNOSIS — T40601A Poisoning by unspecified narcotics, accidental (unintentional), initial encounter: Secondary | ICD-10-CM | POA: Diagnosis not present

## 2017-09-11 DIAGNOSIS — F418 Other specified anxiety disorders: Secondary | ICD-10-CM | POA: Diagnosis present

## 2017-09-11 DIAGNOSIS — K219 Gastro-esophageal reflux disease without esophagitis: Secondary | ICD-10-CM | POA: Diagnosis present

## 2017-09-11 DIAGNOSIS — J690 Pneumonitis due to inhalation of food and vomit: Secondary | ICD-10-CM | POA: Diagnosis not present

## 2017-09-11 DIAGNOSIS — Z79899 Other long term (current) drug therapy: Secondary | ICD-10-CM | POA: Diagnosis not present

## 2017-09-11 DIAGNOSIS — E119 Type 2 diabetes mellitus without complications: Secondary | ICD-10-CM | POA: Diagnosis present

## 2017-09-11 DIAGNOSIS — F17210 Nicotine dependence, cigarettes, uncomplicated: Secondary | ICD-10-CM | POA: Diagnosis present

## 2017-09-11 DIAGNOSIS — Z8673 Personal history of transient ischemic attack (TIA), and cerebral infarction without residual deficits: Secondary | ICD-10-CM | POA: Diagnosis not present

## 2017-09-11 DIAGNOSIS — E7800 Pure hypercholesterolemia, unspecified: Secondary | ICD-10-CM | POA: Diagnosis present

## 2017-09-11 DIAGNOSIS — I712 Thoracic aortic aneurysm, without rupture: Secondary | ICD-10-CM | POA: Diagnosis present

## 2017-09-11 DIAGNOSIS — N400 Enlarged prostate without lower urinary tract symptoms: Secondary | ICD-10-CM | POA: Diagnosis present

## 2017-09-11 DIAGNOSIS — Z86718 Personal history of other venous thrombosis and embolism: Secondary | ICD-10-CM | POA: Diagnosis not present

## 2017-09-11 DIAGNOSIS — F329 Major depressive disorder, single episode, unspecified: Secondary | ICD-10-CM | POA: Diagnosis present

## 2017-09-11 DIAGNOSIS — G8929 Other chronic pain: Secondary | ICD-10-CM | POA: Diagnosis not present

## 2017-09-11 DIAGNOSIS — J449 Chronic obstructive pulmonary disease, unspecified: Secondary | ICD-10-CM | POA: Diagnosis present

## 2017-09-11 DIAGNOSIS — T40601A Poisoning by unspecified narcotics, accidental (unintentional), initial encounter: Secondary | ICD-10-CM | POA: Diagnosis not present

## 2017-09-11 DIAGNOSIS — Z7901 Long term (current) use of anticoagulants: Secondary | ICD-10-CM | POA: Diagnosis not present

## 2017-09-11 DIAGNOSIS — T402X1A Poisoning by other opioids, accidental (unintentional), initial encounter: Secondary | ICD-10-CM | POA: Diagnosis present

## 2017-09-11 DIAGNOSIS — I252 Old myocardial infarction: Secondary | ICD-10-CM | POA: Diagnosis not present

## 2017-09-11 DIAGNOSIS — N179 Acute kidney failure, unspecified: Secondary | ICD-10-CM | POA: Diagnosis present

## 2017-09-11 DIAGNOSIS — I10 Essential (primary) hypertension: Secondary | ICD-10-CM | POA: Diagnosis present

## 2017-09-11 DIAGNOSIS — J9811 Atelectasis: Secondary | ICD-10-CM | POA: Diagnosis not present

## 2017-09-11 DIAGNOSIS — Z7982 Long term (current) use of aspirin: Secondary | ICD-10-CM | POA: Diagnosis not present

## 2017-09-11 DIAGNOSIS — M1990 Unspecified osteoarthritis, unspecified site: Secondary | ICD-10-CM | POA: Diagnosis present

## 2017-09-11 DIAGNOSIS — J189 Pneumonia, unspecified organism: Secondary | ICD-10-CM | POA: Diagnosis not present

## 2017-09-12 DIAGNOSIS — J189 Pneumonia, unspecified organism: Secondary | ICD-10-CM | POA: Diagnosis not present

## 2017-09-12 DIAGNOSIS — T40601A Poisoning by unspecified narcotics, accidental (unintentional), initial encounter: Secondary | ICD-10-CM | POA: Diagnosis not present

## 2017-09-12 DIAGNOSIS — N179 Acute kidney failure, unspecified: Secondary | ICD-10-CM | POA: Diagnosis not present

## 2018-03-16 DIAGNOSIS — Z7901 Long term (current) use of anticoagulants: Secondary | ICD-10-CM | POA: Diagnosis not present

## 2018-03-16 DIAGNOSIS — E1140 Type 2 diabetes mellitus with diabetic neuropathy, unspecified: Secondary | ICD-10-CM | POA: Diagnosis not present

## 2018-03-16 DIAGNOSIS — I10 Essential (primary) hypertension: Secondary | ICD-10-CM | POA: Diagnosis not present

## 2018-03-16 DIAGNOSIS — E782 Mixed hyperlipidemia: Secondary | ICD-10-CM | POA: Diagnosis not present

## 2018-03-16 DIAGNOSIS — I82409 Acute embolism and thrombosis of unspecified deep veins of unspecified lower extremity: Secondary | ICD-10-CM | POA: Diagnosis not present

## 2018-03-16 DIAGNOSIS — Z1331 Encounter for screening for depression: Secondary | ICD-10-CM | POA: Diagnosis not present

## 2018-05-07 DIAGNOSIS — I82409 Acute embolism and thrombosis of unspecified deep veins of unspecified lower extremity: Secondary | ICD-10-CM | POA: Diagnosis not present

## 2018-05-07 DIAGNOSIS — Z1331 Encounter for screening for depression: Secondary | ICD-10-CM | POA: Diagnosis not present

## 2018-05-07 DIAGNOSIS — E782 Mixed hyperlipidemia: Secondary | ICD-10-CM | POA: Diagnosis not present

## 2018-05-07 DIAGNOSIS — E1140 Type 2 diabetes mellitus with diabetic neuropathy, unspecified: Secondary | ICD-10-CM | POA: Diagnosis not present

## 2018-05-07 DIAGNOSIS — I10 Essential (primary) hypertension: Secondary | ICD-10-CM | POA: Diagnosis not present

## 2018-05-07 DIAGNOSIS — Z7901 Long term (current) use of anticoagulants: Secondary | ICD-10-CM | POA: Diagnosis not present

## 2018-06-08 DIAGNOSIS — E1140 Type 2 diabetes mellitus with diabetic neuropathy, unspecified: Secondary | ICD-10-CM | POA: Diagnosis not present

## 2018-06-08 DIAGNOSIS — E782 Mixed hyperlipidemia: Secondary | ICD-10-CM | POA: Diagnosis not present

## 2018-06-08 DIAGNOSIS — F3181 Bipolar II disorder: Secondary | ICD-10-CM | POA: Diagnosis not present

## 2018-06-08 DIAGNOSIS — I82409 Acute embolism and thrombosis of unspecified deep veins of unspecified lower extremity: Secondary | ICD-10-CM | POA: Diagnosis not present

## 2018-07-09 DIAGNOSIS — I82409 Acute embolism and thrombosis of unspecified deep veins of unspecified lower extremity: Secondary | ICD-10-CM | POA: Diagnosis not present

## 2018-07-09 DIAGNOSIS — E1140 Type 2 diabetes mellitus with diabetic neuropathy, unspecified: Secondary | ICD-10-CM | POA: Diagnosis not present

## 2018-07-09 DIAGNOSIS — F3181 Bipolar II disorder: Secondary | ICD-10-CM | POA: Diagnosis not present

## 2018-07-09 DIAGNOSIS — E782 Mixed hyperlipidemia: Secondary | ICD-10-CM | POA: Diagnosis not present

## 2018-08-10 DIAGNOSIS — F3181 Bipolar II disorder: Secondary | ICD-10-CM | POA: Diagnosis not present

## 2018-08-10 DIAGNOSIS — E782 Mixed hyperlipidemia: Secondary | ICD-10-CM | POA: Diagnosis not present

## 2018-08-10 DIAGNOSIS — E1140 Type 2 diabetes mellitus with diabetic neuropathy, unspecified: Secondary | ICD-10-CM | POA: Diagnosis not present

## 2018-08-10 DIAGNOSIS — I10 Essential (primary) hypertension: Secondary | ICD-10-CM | POA: Diagnosis not present

## 2018-08-10 DIAGNOSIS — I82409 Acute embolism and thrombosis of unspecified deep veins of unspecified lower extremity: Secondary | ICD-10-CM | POA: Diagnosis not present

## 2018-09-22 DIAGNOSIS — Z6829 Body mass index (BMI) 29.0-29.9, adult: Secondary | ICD-10-CM | POA: Diagnosis not present

## 2018-09-22 DIAGNOSIS — Z0000 Encounter for general adult medical examination without abnormal findings: Secondary | ICD-10-CM | POA: Diagnosis not present

## 2018-09-22 DIAGNOSIS — Z139 Encounter for screening, unspecified: Secondary | ICD-10-CM | POA: Diagnosis not present

## 2018-09-22 DIAGNOSIS — Z125 Encounter for screening for malignant neoplasm of prostate: Secondary | ICD-10-CM | POA: Diagnosis not present

## 2018-09-22 DIAGNOSIS — Z9181 History of falling: Secondary | ICD-10-CM | POA: Diagnosis not present

## 2018-09-22 DIAGNOSIS — E785 Hyperlipidemia, unspecified: Secondary | ICD-10-CM | POA: Diagnosis not present

## 2018-09-22 DIAGNOSIS — Z1331 Encounter for screening for depression: Secondary | ICD-10-CM | POA: Diagnosis not present

## 2019-04-22 DIAGNOSIS — Z131 Encounter for screening for diabetes mellitus: Secondary | ICD-10-CM | POA: Diagnosis not present

## 2019-04-22 DIAGNOSIS — E1165 Type 2 diabetes mellitus with hyperglycemia: Secondary | ICD-10-CM | POA: Diagnosis not present

## 2019-04-22 DIAGNOSIS — F39 Unspecified mood [affective] disorder: Secondary | ICD-10-CM | POA: Diagnosis not present

## 2019-04-22 DIAGNOSIS — Z0100 Encounter for examination of eyes and vision without abnormal findings: Secondary | ICD-10-CM | POA: Diagnosis not present

## 2019-04-22 DIAGNOSIS — Z0110 Encounter for examination of ears and hearing without abnormal findings: Secondary | ICD-10-CM | POA: Diagnosis not present

## 2019-04-22 DIAGNOSIS — I10 Essential (primary) hypertension: Secondary | ICD-10-CM | POA: Diagnosis not present

## 2019-04-22 DIAGNOSIS — E669 Obesity, unspecified: Secondary | ICD-10-CM | POA: Diagnosis not present

## 2019-04-22 DIAGNOSIS — Z136 Encounter for screening for cardiovascular disorders: Secondary | ICD-10-CM | POA: Diagnosis not present

## 2019-04-23 ENCOUNTER — Emergency Department (HOSPITAL_COMMUNITY)
Admission: EM | Admit: 2019-04-23 | Discharge: 2019-04-23 | Disposition: A | Discharge status: Home / Self Care | Payer: Medicare Other | Attending: Emergency Medicine | Admitting: Emergency Medicine

## 2019-04-23 ENCOUNTER — Emergency Department (HOSPITAL_BASED_OUTPATIENT_CLINIC_OR_DEPARTMENT_OTHER): Payer: Medicare Other

## 2019-04-23 ENCOUNTER — Emergency Department (HOSPITAL_COMMUNITY): Payer: Medicare Other

## 2019-04-23 ENCOUNTER — Encounter (HOSPITAL_COMMUNITY): Payer: Self-pay | Admitting: Emergency Medicine

## 2019-04-23 DIAGNOSIS — Z794 Long term (current) use of insulin: Secondary | ICD-10-CM | POA: Insufficient documentation

## 2019-04-23 DIAGNOSIS — Z8673 Personal history of transient ischemic attack (TIA), and cerebral infarction without residual deficits: Secondary | ICD-10-CM | POA: Insufficient documentation

## 2019-04-23 DIAGNOSIS — E1165 Type 2 diabetes mellitus with hyperglycemia: Secondary | ICD-10-CM | POA: Diagnosis not present

## 2019-04-23 DIAGNOSIS — M79672 Pain in left foot: Secondary | ICD-10-CM | POA: Diagnosis not present

## 2019-04-23 DIAGNOSIS — M7989 Other specified soft tissue disorders: Secondary | ICD-10-CM

## 2019-04-23 DIAGNOSIS — I10 Essential (primary) hypertension: Secondary | ICD-10-CM | POA: Diagnosis not present

## 2019-04-23 DIAGNOSIS — M79605 Pain in left leg: Secondary | ICD-10-CM

## 2019-04-23 DIAGNOSIS — R739 Hyperglycemia, unspecified: Secondary | ICD-10-CM

## 2019-04-23 DIAGNOSIS — Z79899 Other long term (current) drug therapy: Secondary | ICD-10-CM | POA: Diagnosis not present

## 2019-04-23 DIAGNOSIS — F17210 Nicotine dependence, cigarettes, uncomplicated: Secondary | ICD-10-CM | POA: Insufficient documentation

## 2019-04-23 DIAGNOSIS — I2510 Atherosclerotic heart disease of native coronary artery without angina pectoris: Secondary | ICD-10-CM | POA: Diagnosis not present

## 2019-04-23 DIAGNOSIS — M25572 Pain in left ankle and joints of left foot: Secondary | ICD-10-CM | POA: Diagnosis not present

## 2019-04-23 DIAGNOSIS — I739 Peripheral vascular disease, unspecified: Secondary | ICD-10-CM | POA: Diagnosis not present

## 2019-04-23 LAB — URINALYSIS, ROUTINE W REFLEX MICROSCOPIC
Bacteria, UA: NONE SEEN
Bilirubin Urine: NEGATIVE
Glucose, UA: >=500 mg/dL — AB
Hgb urine dipstick: NEGATIVE
Ketones, ur: NEGATIVE mg/dL
Leukocytes,Ua: NEGATIVE
Nitrite: NEGATIVE
Protein, ur: NEGATIVE mg/dL
Specific Gravity, Urine: 1.03 (ref 1.005–1.030)
pH: 6 (ref 5.0–8.0)

## 2019-04-23 LAB — CBC
HCT: 46.7 % (ref 39.0–52.0)
Hemoglobin: 15.8 g/dL (ref 13.0–17.0)
MCH: 29.9 pg (ref 26.0–34.0)
MCHC: 33.8 g/dL (ref 30.0–36.0)
MCV: 88.3 fL (ref 80.0–100.0)
Platelets: 323 10*3/uL (ref 150–400)
RBC: 5.29 MIL/uL (ref 4.22–5.81)
RDW: 13.7 % (ref 11.5–15.5)
WBC: 9.6 10*3/uL (ref 4.0–10.5)
nRBC: 0 % (ref 0.0–0.2)

## 2019-04-23 LAB — CBG MONITORING, ED
Glucose-Capillary: 434 mg/dL — ABNORMAL HIGH (ref 70–99)
Glucose-Capillary: 371 mg/dL — ABNORMAL HIGH (ref 70–99)
Glucose-Capillary: 399 mg/dL — ABNORMAL HIGH (ref 70–99)
Glucose-Capillary: 367 mg/dL — ABNORMAL HIGH (ref 70–99)

## 2019-04-23 LAB — BASIC METABOLIC PANEL
Anion gap: 13 (ref 5–15)
BUN: 16 mg/dL (ref 8–23)
CO2: 21 mmol/L — ABNORMAL LOW (ref 22–32)
Calcium: 9.1 mg/dL (ref 8.9–10.3)
Chloride: 96 mmol/L — ABNORMAL LOW (ref 98–111)
Creatinine, Ser: 1.13 mg/dL (ref 0.61–1.24)
GFR calc Af Amer: >60 mL/min (ref >60)
GFR calc non Af Amer: >60 mL/min (ref >60)
Glucose, Bld: 457 mg/dL — ABNORMAL HIGH (ref 70–99)
Potassium: 4.2 mmol/L (ref 3.5–5.1)
Sodium: 130 mmol/L — ABNORMAL LOW (ref 135–145)

## 2019-04-23 MED ORDER — GLIPIZIDE 10 MG PO TABS
10.0000 mg | ORAL_TABLET | Freq: Every day | ORAL | Status: DC
  Administered 2019-04-23: 16:00:00 10 mg via ORAL
  Filled 2019-04-23 (×2): qty 1

## 2019-04-23 MED ORDER — SODIUM CHLORIDE 0.9 % IV BOLUS
500.0000 mL | Freq: Once | INTRAVENOUS | Status: AC
  Administered 2019-04-23: 17:00:00 500 mL via INTRAVENOUS

## 2019-04-23 MED ORDER — INSULIN ASPART 100 UNIT/ML ~~LOC~~ SOLN
2.0000 [IU] | Freq: Once | SUBCUTANEOUS | Status: AC
  Administered 2019-04-23: 17:00:00 2 [IU] via INTRAVENOUS

## 2019-04-23 MED ORDER — SODIUM CHLORIDE 0.9 % IV BOLUS
2000.0000 mL | Freq: Once | INTRAVENOUS | Status: AC
  Administered 2019-04-23: 14:00:00 2000 mL via INTRAVENOUS

## 2019-04-23 MED ORDER — METFORMIN HCL 850 MG PO TABS
850.0000 mg | ORAL_TABLET | Freq: Once | ORAL | Status: AC
  Administered 2019-04-23: 850 mg via ORAL
  Filled 2019-04-23: qty 1
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
## 2019-04-23 NOTE — Discharge Instructions (Addendum)
It was recommended that you stay in the hospital today as you have decreased blood flow to your left leg.  It is very important that you restart your blood thinning medication and start taking an 81 mg aspirin daily.  Because of the decreased blood flow in your leg, you will also need to follow-up with vascular surgery.  You will need to call Dr. Darcella Cheshire office on Monday to schedule an appointment for follow-up.  As you discussed with him, you will need to have a vascular study of your left leg on 05/02/2019.  You should stop taking your blood thinning medication 2 days prior to the procedure on 04/30/2019.  If in the meantime you experience any new or worsening symptoms then you should return the emergency department immediately.  With reguards to your elevated blood glucose today, you should resume taking your Metformin and glipizide at home.  You should make an appointment with your doctor in the next 5 to 7 days to reassess your blood sugar and whether or not this medication is optimal for you.  Again, if you have any new or worsening symptoms in the meantime please return to the emergency department immediately for further assessment.

## 2019-04-23 NOTE — Progress Notes (Signed)
VASCULAR LAB PRELIMINARY  PRELIMINARY  PRELIMINARY  PRELIMINARY  Left lower extremity venous duplex completed.    Preliminary report:  See CV proc for preliminary results.  Anselmo Rod, RN results. Attempted to call Cortni Couture, PA-C with results, but no answer.  Annamay Laymon, RVT 04/23/2019, 2:10 PM

## 2019-04-23 NOTE — ED Triage Notes (Signed)
Pt. Stated, I dont feel good , I dont check my sugar like Im suppose to and don't take my medicine like Im suppose to, I just feel really bad now.  Yesterday my sugar was 500 something over.
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
## 2019-04-25 ENCOUNTER — Other Ambulatory Visit: Payer: Self-pay

## 2019-04-29 ENCOUNTER — Other Ambulatory Visit (HOSPITAL_COMMUNITY)
Admission: RE | Admit: 2019-04-29 | Discharge: 2019-04-29 | Disposition: A | Discharge status: Home / Self Care | Payer: Medicare Other | Source: Ambulatory Visit | Attending: Vascular Surgery | Admitting: Vascular Surgery

## 2019-04-29 DIAGNOSIS — Z01812 Encounter for preprocedural laboratory examination: Secondary | ICD-10-CM | POA: Insufficient documentation

## 2019-04-29 DIAGNOSIS — Z20822 Contact with and (suspected) exposure to covid-19: Secondary | ICD-10-CM | POA: Diagnosis not present

## 2019-04-30 LAB — SARS CORONAVIRUS 2 (TAT 6-24 HRS): SARS Coronavirus 2: NEGATIVE

## 2019-05-02 ENCOUNTER — Encounter (HOSPITAL_COMMUNITY)
Admission: RE | Disposition: A | Discharge status: Home / Self Care | Payer: Self-pay | Source: Home / Self Care | Attending: Vascular Surgery

## 2019-05-02 ENCOUNTER — Other Ambulatory Visit: Payer: Self-pay

## 2019-05-02 ENCOUNTER — Ambulatory Visit (HOSPITAL_COMMUNITY)
Admission: RE | Admit: 2019-05-02 | Discharge: 2019-05-02 | Disposition: A | Discharge status: Home / Self Care | Payer: Medicare Other | Attending: Vascular Surgery | Admitting: Vascular Surgery

## 2019-05-02 DIAGNOSIS — E119 Type 2 diabetes mellitus without complications: Secondary | ICD-10-CM | POA: Insufficient documentation

## 2019-05-02 DIAGNOSIS — Z8673 Personal history of transient ischemic attack (TIA), and cerebral infarction without residual deficits: Secondary | ICD-10-CM | POA: Diagnosis not present

## 2019-05-02 DIAGNOSIS — Z79899 Other long term (current) drug therapy: Secondary | ICD-10-CM | POA: Insufficient documentation

## 2019-05-02 DIAGNOSIS — F329 Major depressive disorder, single episode, unspecified: Secondary | ICD-10-CM | POA: Insufficient documentation

## 2019-05-02 DIAGNOSIS — F17210 Nicotine dependence, cigarettes, uncomplicated: Secondary | ICD-10-CM | POA: Diagnosis not present

## 2019-05-02 DIAGNOSIS — I70222 Atherosclerosis of native arteries of extremities with rest pain, left leg: Secondary | ICD-10-CM | POA: Diagnosis not present

## 2019-05-02 DIAGNOSIS — Z951 Presence of aortocoronary bypass graft: Secondary | ICD-10-CM | POA: Insufficient documentation

## 2019-05-02 DIAGNOSIS — Z794 Long term (current) use of insulin: Secondary | ICD-10-CM | POA: Diagnosis not present

## 2019-05-02 DIAGNOSIS — I10 Essential (primary) hypertension: Secondary | ICD-10-CM | POA: Diagnosis not present

## 2019-05-02 DIAGNOSIS — I2510 Atherosclerotic heart disease of native coronary artery without angina pectoris: Secondary | ICD-10-CM | POA: Diagnosis not present

## 2019-05-02 DIAGNOSIS — I739 Peripheral vascular disease, unspecified: Secondary | ICD-10-CM | POA: Diagnosis not present

## 2019-05-02 HISTORY — PX: ABDOMINAL AORTOGRAM W/LOWER EXTREMITY: CATH118223

## 2019-05-02 LAB — POCT I-STAT, CHEM 8
BUN: 6 mg/dL — ABNORMAL LOW (ref 8–23)
Calcium, Ion: 1.11 mmol/L — ABNORMAL LOW (ref 1.15–1.40)
Chloride: 95 mmol/L — ABNORMAL LOW (ref 98–111)
Creatinine, Ser: 0.8 mg/dL (ref 0.61–1.24)
Glucose, Bld: 337 mg/dL — ABNORMAL HIGH (ref 70–99)
HCT: 44 % (ref 39.0–52.0)
Hemoglobin: 15 g/dL (ref 13.0–17.0)
Potassium: 4.3 mmol/L (ref 3.5–5.1)
Sodium: 130 mmol/L — ABNORMAL LOW (ref 135–145)
TCO2: 28 mmol/L (ref 22–32)

## 2019-05-02 LAB — GLUCOSE, CAPILLARY: Glucose-Capillary: 208 mg/dL — ABNORMAL HIGH (ref 70–99)

## 2019-05-02 SURGERY — ABDOMINAL AORTOGRAM W/LOWER EXTREMITY
Anesthesia: LOCAL | Laterality: Bilateral

## 2019-05-02 MED ORDER — HYDRALAZINE HCL 20 MG/ML IJ SOLN: 5.0000 mg | INTRAMUSCULAR | Status: DC | PRN

## 2019-05-02 MED ORDER — SODIUM CHLORIDE 0.9 % IV SOLN: INTRAVENOUS | Status: DC

## 2019-05-02 MED ORDER — INSULIN ASPART 100 UNIT/ML ~~LOC~~ SOLN
5.0000 [IU] | Freq: Once | SUBCUTANEOUS | Status: AC
  Administered 2019-05-02: 5 [IU] via SUBCUTANEOUS

## 2019-05-02 MED ORDER — HEPARIN (PORCINE) IN NACL 1000-0.9 UT/500ML-% IV SOLN
INTRAVENOUS | Status: DC | PRN
  Administered 2019-05-02 (×2): 500 mL

## 2019-05-02 MED ORDER — FENTANYL CITRATE (PF) 100 MCG/2ML IJ SOLN
INTRAMUSCULAR | Status: AC
  Filled 2019-05-02: qty 2

## 2019-05-02 MED ORDER — ACETAMINOPHEN 325 MG PO TABS: 650.0000 mg | ORAL_TABLET | ORAL | Status: DC | PRN

## 2019-05-02 MED ORDER — INSULIN ASPART 100 UNIT/ML ~~LOC~~ SOLN
SUBCUTANEOUS | Status: AC
  Filled 2019-05-02: qty 1

## 2019-05-02 MED ORDER — ONDANSETRON HCL 4 MG/2ML IJ SOLN: 4.0000 mg | Freq: Four times a day (QID) | INTRAMUSCULAR | Status: DC | PRN

## 2019-05-02 MED ORDER — SODIUM CHLORIDE 0.9% FLUSH: 3.0000 mL | INTRAVENOUS | Status: DC | PRN

## 2019-05-02 MED ORDER — HEPARIN (PORCINE) IN NACL 1000-0.9 UT/500ML-% IV SOLN
INTRAVENOUS | Status: AC
  Filled 2019-05-02: qty 1000

## 2019-05-02 MED ORDER — MIDAZOLAM HCL 2 MG/2ML IJ SOLN
INTRAMUSCULAR | Status: AC
  Filled 2019-05-02: qty 2

## 2019-05-02 MED ORDER — LIDOCAINE HCL (PF) 1 % IJ SOLN
INTRAMUSCULAR | Status: DC | PRN
  Administered 2019-05-02: 20 mL via INTRADERMAL

## 2019-05-02 MED ORDER — SODIUM CHLORIDE 0.9% FLUSH: 3.0000 mL | Freq: Two times a day (BID) | INTRAVENOUS | Status: DC

## 2019-05-02 MED ORDER — LIDOCAINE HCL (PF) 1 % IJ SOLN
INTRAMUSCULAR | Status: AC
  Filled 2019-05-02: qty 30

## 2019-05-02 MED ORDER — SODIUM CHLORIDE 0.9 % WEIGHT BASED INFUSION: 1.0000 mL/kg/h | INTRAVENOUS | Status: DC

## 2019-05-02 MED ORDER — SODIUM CHLORIDE 0.9 % IV SOLN: 250.0000 mL | INTRAVENOUS | Status: DC | PRN

## 2019-05-02 MED ORDER — IODIXANOL 320 MG/ML IV SOLN
INTRAVENOUS | Status: DC | PRN
  Administered 2019-05-02: 97 mL via INTRA_ARTERIAL

## 2019-05-02 MED ORDER — FENTANYL CITRATE (PF) 100 MCG/2ML IJ SOLN
INTRAMUSCULAR | Status: DC | PRN
  Administered 2019-05-02 (×2): 50 ug via INTRAVENOUS

## 2019-05-02 MED ORDER — MIDAZOLAM HCL 2 MG/2ML IJ SOLN
INTRAMUSCULAR | Status: DC | PRN
  Administered 2019-05-02: 1 mg via INTRAVENOUS

## 2019-05-02 MED ORDER — LABETALOL HCL 5 MG/ML IV SOLN: 10.0000 mg | INTRAVENOUS | Status: DC | PRN

## 2019-05-02 SURGICAL SUPPLY — 10 items
CATH OMNI FLUSH 5F 65CM (CATHETERS) ×1 IMPLANT
CLOSURE MYNX CONTROL 5F (Vascular Products) ×1 IMPLANT
KIT MICROPUNCTURE NIT STIFF (SHEATH) ×1 IMPLANT
KIT PV (KITS) ×2 IMPLANT
SHEATH PINNACLE 5F 10CM (SHEATH) ×1 IMPLANT
SHEATH PROBE COVER 6X72 (BAG) ×1 IMPLANT
SYR MEDRAD MARK V 150ML (SYRINGE) ×1 IMPLANT
TRANSDUCER W/STOPCOCK (MISCELLANEOUS) ×2 IMPLANT
TRAY PV CATH (CUSTOM PROCEDURE TRAY) ×2 IMPLANT
WIRE J 3MM .035X145CM (WIRE) ×1 IMPLANT

## 2019-05-02 NOTE — Discharge Instructions (Signed)
Drink plenty of fluids next 48 hours and hold metformin until 3/10 pm.  Femoral Site Care This sheet gives you information about how to care for yourself after your procedure. Your health care provider may also give you more specific instructions. If you have problems or questions, contact your health care provider. What can I expect after the procedure? After the procedure, it is common to have:  Bruising that usually fades within 1-2 weeks.  Tenderness at the site. Follow these instructions at home: Wound care  Follow instructions from your health care provider about how to take care of your insertion site. Make sure you: ? Wash your hands with soap and water before you change your bandage (dressing). If soap and water are not available, use hand sanitizer. ? Remove your dressing as told by your health care provider. In 24 hours  Do not take baths, swim, or use a hot tub until your health care provider approves.  You may shower 24-48 hours after the procedure or as told by your health care provider. ? Gently wash the site with plain soap and water. ? Pat the area dry with a clean towel. ? Do not rub the site. This may cause bleeding.  Do not apply powder or lotion to the site. Keep the site clean and dry.  Check your femoral site every day for signs of infection. Check for: ? Redness, swelling, or pain. ? Fluid or blood. ? Warmth. ? Pus or a bad smell. Activity  For the first 2-3 days after your procedure, or as long as directed: ? Avoid climbing stairs as much as possible. ? Do not squat.  Do not lift anything that is heavier than 10 lb (4.5 kg), or the limit that you are told, until your health care provider says that it is safe. For 5 days  Rest as directed. ? Avoid sitting for a long time without moving. Get up to take short walks every 1-2 hours.  Do not drive for 24 hours if you were given a medicine to help you relax (sedative). General instructions  Take  over-the-counter and prescription medicines only as told by your health care provider.  Keep all follow-up visits as told by your health care provider. This is important. Contact a health care provider if you have:  A fever or chills.  You have redness, swelling, or pain around your insertion site. Get help right away if:  The catheter insertion area swells very fast.  You pass out.  You suddenly start to sweat or your skin gets clammy.  The catheter insertion area is bleeding, and the bleeding does not stop when you hold steady pressure on the area.  The area near or just beyond the catheter insertion site becomes pale, cool, tingly, or numb. These symptoms may represent a serious problem that is an emergency. Do not wait to see if the symptoms will go away. Get medical help right away. Call your local emergency services (911 in the [place] Do not drive yourself to the hospital. Summary  After the procedure, it is common to have bruising that usually fades within 1-2 weeks.  Check your femoral site every day for signs of infection.  Do not lift anything that is heavier than 10 lb (4.5 kg), or the limit that you are told, until your health care provider says that it is safe. This information is not intended to replace advice given to you by your health care provider. Make sure you discuss any questions  you have with your health care provider. Document Revised: 02/23/2017 Document Reviewed: 02/23/2017 Elsevier Patient Education  2020 [employer].
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
## 2019-05-02 NOTE — Op Note (Signed)
    Patient name: [name] MRN: [id number] DOB: 1955-12-07 Sex: male  05/02/2019 Pre-operative Diagnosis: Peripheral arterial disease Post-operative diagnosis:  Same Surgeon:  Apolinar Junes C. Randie Heinz, MD Procedure Performed: 1.  Ultrasound-guided cannulation right common femoral artery 2.  Aortogram with bilateral extremity runoff 3.  Moderate sedation with fentanyl and Versed for 18 minutes 4.  Minx device closure right common femoral artery  Indications: 64 year old male presented to the emergency department with left lower extremity pain.  He has numbness in his bilateral lower extremities.  He had very minimal signals in the emergency department that night was indicated for angiography.  Patient decided to go home present as an outpatient.  He has no wounds at this time.  He denies rest pain.  Findings: The aorta and iliac segments although diseased are free of flow-limiting stenosis.  Left side which is the site of interest the SFA is very diminutive and occludes distally reconstitutes what appears to be below the knee.  His posterior tibial was large and the dominant runoff.  On the right side he has flush occlusion of the right SFA.  He reconstitutes below the knee popliteal artery with dominant runoff via the posterior tibial artery.  We will evaluate patient with vein mapping and ABIs and if he has continued symptoms we will consider left femoral to posterior tibial artery bypass.   Procedure:  The patient was identified in the holding area and taken to room 8.  The patient was then placed supine on the table and prepped and draped in the usual sterile fashion.  A time out was called.  Ultrasound was used to evaluate the right common femoral artery was noted to be patent.  The area was anesthetized 1% lidocaine cannulated with direct ultrasound visualization with micropuncture needle followed wire and sheath.  And images saved department record.  J-wire was placed followed by 5 [place]  sheath.  Omni catheter placed the level of L1 aortogram performed.  We pulled out the bifurcation for bilateral lower extremity runoff.  With above findings we elected not to intervene we will consider surgery if patient has persistent symptoms.  Minx device was used to close the right common femoral puncture site.  He tolerated procedure well or may complication.  Contrast: 97 cc    Shamyra Farias C. Randie Heinz, MD [facility] Office: [phone] Pager: [phone]

## 2019-05-31 ENCOUNTER — Telehealth (HOSPITAL_COMMUNITY): Payer: Self-pay

## 2019-05-31 NOTE — Telephone Encounter (Signed)

## 2019-06-01 ENCOUNTER — Other Ambulatory Visit: Payer: Self-pay | Admitting: *Deleted

## 2019-06-01 DIAGNOSIS — M79606 Pain in leg, unspecified: Secondary | ICD-10-CM

## 2019-06-02 ENCOUNTER — Encounter (HOSPITAL_COMMUNITY): Payer: Medicare Other

## 2019-06-02 ENCOUNTER — Inpatient Hospital Stay (HOSPITAL_COMMUNITY): Admit: 2019-06-02 | Payer: Medicare Other

## 2019-06-03 ENCOUNTER — Ambulatory Visit: Payer: Medicare Other | Admitting: Vascular Surgery

## 2019-08-08 DIAGNOSIS — K429 Umbilical hernia without obstruction or gangrene: Secondary | ICD-10-CM | POA: Diagnosis not present

## 2019-08-08 DIAGNOSIS — Z125 Encounter for screening for malignant neoplasm of prostate: Secondary | ICD-10-CM | POA: Diagnosis not present

## 2019-08-08 DIAGNOSIS — F418 Other specified anxiety disorders: Secondary | ICD-10-CM | POA: Diagnosis not present

## 2019-08-08 DIAGNOSIS — E119 Type 2 diabetes mellitus without complications: Secondary | ICD-10-CM | POA: Diagnosis not present

## 2019-08-08 DIAGNOSIS — G8929 Other chronic pain: Secondary | ICD-10-CM | POA: Diagnosis not present

## 2019-08-08 DIAGNOSIS — E669 Obesity, unspecified: Secondary | ICD-10-CM | POA: Diagnosis not present

## 2019-08-08 DIAGNOSIS — M5442 Lumbago with sciatica, left side: Secondary | ICD-10-CM | POA: Diagnosis not present

## 2019-08-08 DIAGNOSIS — I10 Essential (primary) hypertension: Secondary | ICD-10-CM | POA: Diagnosis not present

## 2019-08-08 DIAGNOSIS — E1165 Type 2 diabetes mellitus with hyperglycemia: Secondary | ICD-10-CM | POA: Diagnosis not present

## 2019-08-08 DIAGNOSIS — F39 Unspecified mood [affective] disorder: Secondary | ICD-10-CM | POA: Diagnosis not present

## 2019-08-08 DIAGNOSIS — Z794 Long term (current) use of insulin: Secondary | ICD-10-CM | POA: Diagnosis not present

## 2019-08-19 ENCOUNTER — Encounter: Payer: Self-pay | Admitting: Surgery

## 2019-08-31 DIAGNOSIS — R5383 Other fatigue: Secondary | ICD-10-CM | POA: Diagnosis not present

## 2019-08-31 DIAGNOSIS — Z79899 Other long term (current) drug therapy: Secondary | ICD-10-CM | POA: Diagnosis not present

## 2019-08-31 DIAGNOSIS — F333 Major depressive disorder, recurrent, severe with psychotic symptoms: Secondary | ICD-10-CM | POA: Diagnosis not present

## 2019-11-29 DIAGNOSIS — E669 Obesity, unspecified: Secondary | ICD-10-CM | POA: Diagnosis not present

## 2019-11-29 DIAGNOSIS — I10 Essential (primary) hypertension: Secondary | ICD-10-CM | POA: Diagnosis not present

## 2019-11-29 DIAGNOSIS — F418 Other specified anxiety disorders: Secondary | ICD-10-CM | POA: Diagnosis not present

## 2019-11-29 DIAGNOSIS — K429 Umbilical hernia without obstruction or gangrene: Secondary | ICD-10-CM | POA: Diagnosis not present

## 2019-11-29 DIAGNOSIS — G8929 Other chronic pain: Secondary | ICD-10-CM | POA: Diagnosis not present

## 2019-11-29 DIAGNOSIS — F39 Unspecified mood [affective] disorder: Secondary | ICD-10-CM | POA: Diagnosis not present

## 2019-11-29 DIAGNOSIS — Z794 Long term (current) use of insulin: Secondary | ICD-10-CM | POA: Diagnosis not present

## 2019-11-29 DIAGNOSIS — E119 Type 2 diabetes mellitus without complications: Secondary | ICD-10-CM | POA: Diagnosis not present

## 2019-11-29 DIAGNOSIS — R5383 Other fatigue: Secondary | ICD-10-CM | POA: Diagnosis not present

## 2019-11-29 DIAGNOSIS — M5442 Lumbago with sciatica, left side: Secondary | ICD-10-CM | POA: Diagnosis not present

## 2020-03-02 ENCOUNTER — Inpatient Hospital Stay (HOSPITAL_COMMUNITY)
Admission: EM | Admit: 2020-03-02 | Discharge: 2020-03-09 | DRG: 246 | Disposition: A | Discharge status: Home Health | Payer: Medicare Other | Attending: Internal Medicine | Admitting: Internal Medicine

## 2020-03-02 ENCOUNTER — Inpatient Hospital Stay (HOSPITAL_COMMUNITY): Payer: Medicare Other

## 2020-03-02 ENCOUNTER — Emergency Department (HOSPITAL_COMMUNITY): Payer: Medicare Other

## 2020-03-02 DIAGNOSIS — D649 Anemia, unspecified: Secondary | ICD-10-CM | POA: Diagnosis present

## 2020-03-02 DIAGNOSIS — I469 Cardiac arrest, cause unspecified: Secondary | ICD-10-CM

## 2020-03-02 DIAGNOSIS — Z8673 Personal history of transient ischemic attack (TIA), and cerebral infarction without residual deficits: Secondary | ICD-10-CM

## 2020-03-02 DIAGNOSIS — N182 Chronic kidney disease, stage 2 (mild): Secondary | ICD-10-CM | POA: Diagnosis present

## 2020-03-02 DIAGNOSIS — F1310 Sedative, hypnotic or anxiolytic abuse, uncomplicated: Secondary | ICD-10-CM | POA: Diagnosis present

## 2020-03-02 DIAGNOSIS — R7401 Elevation of levels of liver transaminase levels: Secondary | ICD-10-CM | POA: Diagnosis present

## 2020-03-02 DIAGNOSIS — Z720 Tobacco use: Secondary | ICD-10-CM

## 2020-03-02 DIAGNOSIS — R0600 Dyspnea, unspecified: Secondary | ICD-10-CM | POA: Diagnosis not present

## 2020-03-02 DIAGNOSIS — E872 Acidosis: Secondary | ICD-10-CM | POA: Diagnosis present

## 2020-03-02 DIAGNOSIS — R079 Chest pain, unspecified: Secondary | ICD-10-CM | POA: Diagnosis not present

## 2020-03-02 DIAGNOSIS — R0602 Shortness of breath: Secondary | ICD-10-CM

## 2020-03-02 DIAGNOSIS — E1165 Type 2 diabetes mellitus with hyperglycemia: Secondary | ICD-10-CM | POA: Diagnosis present

## 2020-03-02 DIAGNOSIS — J90 Pleural effusion, not elsewhere classified: Secondary | ICD-10-CM | POA: Diagnosis not present

## 2020-03-02 DIAGNOSIS — I2699 Other pulmonary embolism without acute cor pulmonale: Secondary | ICD-10-CM | POA: Diagnosis not present

## 2020-03-02 DIAGNOSIS — I130 Hypertensive heart and chronic kidney disease with heart failure and stage 1 through stage 4 chronic kidney disease, or unspecified chronic kidney disease: Secondary | ICD-10-CM | POA: Diagnosis not present

## 2020-03-02 DIAGNOSIS — I2510 Atherosclerotic heart disease of native coronary artery without angina pectoris: Secondary | ICD-10-CM | POA: Diagnosis present

## 2020-03-02 DIAGNOSIS — Z7902 Long term (current) use of antithrombotics/antiplatelets: Secondary | ICD-10-CM

## 2020-03-02 DIAGNOSIS — J9601 Acute respiratory failure with hypoxia: Principal | ICD-10-CM | POA: Diagnosis present

## 2020-03-02 DIAGNOSIS — I517 Cardiomegaly: Secondary | ICD-10-CM | POA: Diagnosis not present

## 2020-03-02 DIAGNOSIS — S2243XA Multiple fractures of ribs, bilateral, initial encounter for closed fracture: Secondary | ICD-10-CM | POA: Diagnosis present

## 2020-03-02 DIAGNOSIS — I468 Cardiac arrest due to other underlying condition: Secondary | ICD-10-CM | POA: Diagnosis present

## 2020-03-02 DIAGNOSIS — Y848 Other medical procedures as the cause of abnormal reaction of the patient, or of later complication, without mention of misadventure at the time of the procedure: Secondary | ICD-10-CM | POA: Diagnosis not present

## 2020-03-02 DIAGNOSIS — F1510 Other stimulant abuse, uncomplicated: Secondary | ICD-10-CM | POA: Diagnosis present

## 2020-03-02 DIAGNOSIS — I214 Non-ST elevation (NSTEMI) myocardial infarction: Secondary | ICD-10-CM | POA: Diagnosis not present

## 2020-03-02 DIAGNOSIS — E119 Type 2 diabetes mellitus without complications: Secondary | ICD-10-CM | POA: Diagnosis not present

## 2020-03-02 DIAGNOSIS — G9340 Encephalopathy, unspecified: Secondary | ICD-10-CM

## 2020-03-02 DIAGNOSIS — N179 Acute kidney failure, unspecified: Secondary | ICD-10-CM | POA: Diagnosis not present

## 2020-03-02 DIAGNOSIS — I25810 Atherosclerosis of coronary artery bypass graft(s) without angina pectoris: Secondary | ICD-10-CM | POA: Diagnosis not present

## 2020-03-02 DIAGNOSIS — E1122 Type 2 diabetes mellitus with diabetic chronic kidney disease: Secondary | ICD-10-CM | POA: Diagnosis present

## 2020-03-02 DIAGNOSIS — F1210 Cannabis abuse, uncomplicated: Secondary | ICD-10-CM | POA: Diagnosis present

## 2020-03-02 DIAGNOSIS — I5043 Acute on chronic combined systolic (congestive) and diastolic (congestive) heart failure: Secondary | ICD-10-CM | POA: Diagnosis not present

## 2020-03-02 DIAGNOSIS — Z951 Presence of aortocoronary bypass graft: Secondary | ICD-10-CM

## 2020-03-02 DIAGNOSIS — Z20822 Contact with and (suspected) exposure to covid-19: Secondary | ICD-10-CM | POA: Diagnosis present

## 2020-03-02 DIAGNOSIS — T45515A Adverse effect of anticoagulants, initial encounter: Secondary | ICD-10-CM | POA: Diagnosis not present

## 2020-03-02 DIAGNOSIS — I429 Cardiomyopathy, unspecified: Secondary | ICD-10-CM | POA: Diagnosis present

## 2020-03-02 DIAGNOSIS — Z7982 Long term (current) use of aspirin: Secondary | ICD-10-CM

## 2020-03-02 DIAGNOSIS — Z7901 Long term (current) use of anticoagulants: Secondary | ICD-10-CM

## 2020-03-02 DIAGNOSIS — E785 Hyperlipidemia, unspecified: Secondary | ICD-10-CM | POA: Diagnosis present

## 2020-03-02 DIAGNOSIS — R52 Pain, unspecified: Secondary | ICD-10-CM | POA: Diagnosis not present

## 2020-03-02 DIAGNOSIS — F17210 Nicotine dependence, cigarettes, uncomplicated: Secondary | ICD-10-CM | POA: Diagnosis present

## 2020-03-02 DIAGNOSIS — J9811 Atelectasis: Secondary | ICD-10-CM | POA: Diagnosis not present

## 2020-03-02 DIAGNOSIS — R042 Hemoptysis: Secondary | ICD-10-CM | POA: Diagnosis not present

## 2020-03-02 DIAGNOSIS — J9602 Acute respiratory failure with hypercapnia: Secondary | ICD-10-CM | POA: Diagnosis not present

## 2020-03-02 DIAGNOSIS — I719 Aortic aneurysm of unspecified site, without rupture: Secondary | ICD-10-CM

## 2020-03-02 DIAGNOSIS — Z6830 Body mass index (BMI) 30.0-30.9, adult: Secondary | ICD-10-CM

## 2020-03-02 DIAGNOSIS — Z955 Presence of coronary angioplasty implant and graft: Secondary | ICD-10-CM

## 2020-03-02 DIAGNOSIS — I499 Cardiac arrhythmia, unspecified: Secondary | ICD-10-CM | POA: Diagnosis not present

## 2020-03-02 DIAGNOSIS — R4020 Unspecified coma: Secondary | ICD-10-CM | POA: Diagnosis not present

## 2020-03-02 DIAGNOSIS — Z79899 Other long term (current) drug therapy: Secondary | ICD-10-CM

## 2020-03-02 DIAGNOSIS — G928 Other toxic encephalopathy: Secondary | ICD-10-CM | POA: Diagnosis present

## 2020-03-02 DIAGNOSIS — F32A Depression, unspecified: Secondary | ICD-10-CM | POA: Diagnosis present

## 2020-03-02 DIAGNOSIS — I10 Essential (primary) hypertension: Secondary | ICD-10-CM | POA: Diagnosis not present

## 2020-03-02 DIAGNOSIS — R404 Transient alteration of awareness: Secondary | ICD-10-CM | POA: Diagnosis not present

## 2020-03-02 DIAGNOSIS — Z7984 Long term (current) use of oral hypoglycemic drugs: Secondary | ICD-10-CM

## 2020-03-02 DIAGNOSIS — I712 Thoracic aortic aneurysm, without rupture: Secondary | ICD-10-CM | POA: Diagnosis not present

## 2020-03-02 DIAGNOSIS — R739 Hyperglycemia, unspecified: Secondary | ICD-10-CM

## 2020-03-02 DIAGNOSIS — E878 Other disorders of electrolyte and fluid balance, not elsewhere classified: Secondary | ICD-10-CM | POA: Diagnosis present

## 2020-03-02 DIAGNOSIS — E871 Hypo-osmolality and hyponatremia: Secondary | ICD-10-CM | POA: Diagnosis present

## 2020-03-02 DIAGNOSIS — E669 Obesity, unspecified: Secondary | ICD-10-CM | POA: Diagnosis present

## 2020-03-02 DIAGNOSIS — N289 Disorder of kidney and ureter, unspecified: Secondary | ICD-10-CM

## 2020-03-02 DIAGNOSIS — J9690 Respiratory failure, unspecified, unspecified whether with hypoxia or hypercapnia: Secondary | ICD-10-CM | POA: Diagnosis not present

## 2020-03-02 DIAGNOSIS — R4182 Altered mental status, unspecified: Secondary | ICD-10-CM | POA: Diagnosis not present

## 2020-03-02 DIAGNOSIS — R059 Cough, unspecified: Secondary | ICD-10-CM | POA: Diagnosis not present

## 2020-03-02 LAB — BASIC METABOLIC PANEL
Anion gap: 14 (ref 5–15)
BUN: 14 mg/dL (ref 8–23)
CO2: 20 mmol/L — ABNORMAL LOW (ref 22–32)
Calcium: 9 mg/dL (ref 8.9–10.3)
Chloride: 97 mmol/L — ABNORMAL LOW (ref 98–111)
Creatinine, Ser: 1.31 mg/dL — ABNORMAL HIGH (ref 0.61–1.24)
GFR, Estimated: >60 mL/min (ref >60)
Glucose, Bld: 383 mg/dL — ABNORMAL HIGH (ref 70–99)
Potassium: 4.7 mmol/L (ref 3.5–5.1)
Sodium: 131 mmol/L — ABNORMAL LOW (ref 135–145)

## 2020-03-02 LAB — COMPREHENSIVE METABOLIC PANEL
ALT: 99 U/L — ABNORMAL HIGH (ref 0–44)
AST: 138 U/L — ABNORMAL HIGH (ref 15–41)
Albumin: 3.2 g/dL — ABNORMAL LOW (ref 3.5–5.0)
Alkaline Phosphatase: 80 U/L (ref 38–126)
Anion gap: 13 (ref 5–15)
BUN: 15 mg/dL (ref 8–23)
CO2: 22 mmol/L (ref 22–32)
Calcium: 8.7 mg/dL — ABNORMAL LOW (ref 8.9–10.3)
Chloride: 96 mmol/L — ABNORMAL LOW (ref 98–111)
Creatinine, Ser: 1.55 mg/dL — ABNORMAL HIGH (ref 0.61–1.24)
GFR, Estimated: 50 mL/min — ABNORMAL LOW (ref >60)
Glucose, Bld: 476 mg/dL — ABNORMAL HIGH (ref 70–99)
Potassium: 3.9 mmol/L (ref 3.5–5.1)
Sodium: 131 mmol/L — ABNORMAL LOW (ref 135–145)
Total Bilirubin: 0.5 mg/dL (ref 0.3–1.2)
Total Protein: 6.4 g/dL — ABNORMAL LOW (ref 6.5–8.1)

## 2020-03-02 LAB — GLUCOSE, CAPILLARY
Glucose-Capillary: 208 mg/dL — ABNORMAL HIGH (ref 70–99)
Glucose-Capillary: 189 mg/dL — ABNORMAL HIGH (ref 70–99)

## 2020-03-02 LAB — RAPID URINE DRUG SCREEN, HOSP PERFORMED
Amphetamines: POSITIVE — AB
Barbiturates: NOT DETECTED
Benzodiazepines: POSITIVE — AB
Cocaine: NOT DETECTED
Opiates: NOT DETECTED
Tetrahydrocannabinol: POSITIVE — AB

## 2020-03-02 LAB — URINALYSIS, ROUTINE W REFLEX MICROSCOPIC
Bilirubin Urine: NEGATIVE
Glucose, UA: >=500 mg/dL — AB
Hgb urine dipstick: NEGATIVE
Ketones, ur: NEGATIVE mg/dL
Leukocytes,Ua: NEGATIVE
Nitrite: NEGATIVE
Protein, ur: NEGATIVE mg/dL
Specific Gravity, Urine: 1.032 — ABNORMAL HIGH (ref 1.005–1.030)
pH: 6 (ref 5.0–8.0)

## 2020-03-02 LAB — CBC WITH DIFFERENTIAL/PLATELET
Abs Immature Granulocytes: 0.1 10*3/uL — ABNORMAL HIGH (ref 0.00–0.07)
Basophils Absolute: 0.1 10*3/uL (ref 0.0–0.1)
Basophils Relative: 1 %
Eosinophils Absolute: 0.1 10*3/uL (ref 0.0–0.5)
Eosinophils Relative: 1 %
HCT: 45.4 % (ref 39.0–52.0)
Hemoglobin: 15.7 g/dL (ref 13.0–17.0)
Immature Granulocytes: 1 %
Lymphocytes Relative: 18 %
Lymphs Abs: 1.8 10*3/uL (ref 0.7–4.0)
MCH: 30.4 pg (ref 26.0–34.0)
MCHC: 34.6 g/dL (ref 30.0–36.0)
MCV: 88 fL (ref 80.0–100.0)
Monocytes Absolute: 0.5 10*3/uL (ref 0.1–1.0)
Monocytes Relative: 5 %
Neutro Abs: 7.1 10*3/uL (ref 1.7–7.7)
Neutrophils Relative %: 74 %
Platelets: 244 10*3/uL (ref 150–400)
RBC: 5.16 MIL/uL (ref 4.22–5.81)
RDW: 13.2 % (ref 11.5–15.5)
WBC: 9.6 10*3/uL (ref 4.0–10.5)
nRBC: 0 % (ref 0.0–0.2)

## 2020-03-02 LAB — I-STAT ARTERIAL BLOOD GAS, ED
Acid-Base Excess: 3 mmol/L — ABNORMAL HIGH (ref 0.0–2.0)
Bicarbonate: 29.2 mmol/L — ABNORMAL HIGH (ref 20.0–28.0)
Calcium, Ion: 1.16 mmol/L (ref 1.15–1.40)
HCT: 43 % (ref 39.0–52.0)
Hemoglobin: 14.6 g/dL (ref 13.0–17.0)
O2 Saturation: 100 %
Patient temperature: 96.9
Potassium: 4.4 mmol/L (ref 3.5–5.1)
Sodium: 130 mmol/L — ABNORMAL LOW (ref 135–145)
TCO2: 31 mmol/L (ref 22–32)
pCO2 arterial: 48.8 mmHg — ABNORMAL HIGH (ref 32.0–48.0)
pH, Arterial: 7.381 (ref 7.350–7.450)
pO2, Arterial: 455 mmHg — ABNORMAL HIGH (ref 83.0–108.0)

## 2020-03-02 LAB — LACTIC ACID, PLASMA
Lactic Acid, Venous: 3.2 mmol/L (ref 0.5–1.9)
Lactic Acid, Venous: 2.7 mmol/L (ref 0.5–1.9)
Lactic Acid, Venous: 1.2 mmol/L (ref 0.5–1.9)

## 2020-03-02 LAB — ECHOCARDIOGRAM COMPLETE
Area-P 1/2: 3.93 cm2
Calc EF: 42.3 %
Height: 68 in
S' Lateral: 3.7 cm
Single Plane A2C EF: 46 %
Single Plane A4C EF: 39.5 %
Weight: 3600 oz

## 2020-03-02 LAB — RESP PANEL BY RT-PCR (FLU A&B, COVID) ARPGX2
Influenza A by PCR: NEGATIVE
Influenza B by PCR: NEGATIVE
SARS Coronavirus 2 by RT PCR: NEGATIVE

## 2020-03-02 LAB — PHOSPHORUS: Phosphorus: 4.8 mg/dL — ABNORMAL HIGH (ref 2.5–4.6)

## 2020-03-02 LAB — CBG MONITORING, ED
Glucose-Capillary: 446 mg/dL — ABNORMAL HIGH (ref 70–99)
Glucose-Capillary: 320 mg/dL — ABNORMAL HIGH (ref 70–99)
Glucose-Capillary: 222 mg/dL — ABNORMAL HIGH (ref 70–99)

## 2020-03-02 LAB — ETHANOL: Alcohol, Ethyl (B): <10 mg/dL (ref <10)

## 2020-03-02 LAB — HIV ANTIBODY (ROUTINE TESTING W REFLEX): HIV Screen 4th Generation wRfx: NONREACTIVE

## 2020-03-02 LAB — MAGNESIUM: Magnesium: 2 mg/dL (ref 1.7–2.4)

## 2020-03-02 LAB — HEMOGLOBIN A1C
Hgb A1c MFr Bld: 11.3 % — ABNORMAL HIGH (ref 4.8–5.6)
Mean Plasma Glucose: 277.61 mg/dL

## 2020-03-02 LAB — TROPONIN I (HIGH SENSITIVITY)
Troponin I (High Sensitivity): 23 ng/L — ABNORMAL HIGH (ref <18)
Troponin I (High Sensitivity): 102 ng/L (ref <18)
Troponin I (High Sensitivity): 463 ng/L (ref <18)
Troponin I (High Sensitivity): 311 ng/L (ref <18)

## 2020-03-02 MED ORDER — ACETAMINOPHEN 325 MG PO TABS
650.0000 mg | ORAL_TABLET | Freq: Four times a day (QID) | ORAL | Status: DC | PRN
  Administered 2020-03-02 – 2020-03-03 (×2): 650 mg via ORAL
  Filled 2020-03-02 (×2): qty 2

## 2020-03-02 MED ORDER — FENTANYL BOLUS VIA INFUSION
50.0000 ug | INTRAVENOUS | Status: DC | PRN
  Filled 2020-03-02: qty 50

## 2020-03-02 MED ORDER — ROSUVASTATIN CALCIUM 20 MG PO TABS
20.0000 mg | ORAL_TABLET | Freq: Every day | ORAL | Status: DC
  Administered 2020-03-02 – 2020-03-08 (×7): 20 mg via ORAL
  Filled 2020-03-02 (×7): qty 1

## 2020-03-02 MED ORDER — ONDANSETRON HCL 4 MG/2ML IJ SOLN
4.0000 mg | Freq: Four times a day (QID) | INTRAMUSCULAR | Status: DC | PRN
  Administered 2020-03-05: 4 mg via INTRAVENOUS
  Filled 2020-03-02: qty 2

## 2020-03-02 MED ORDER — DOCUSATE SODIUM 100 MG PO CAPS
100.0000 mg | ORAL_CAPSULE | Freq: Two times a day (BID) | ORAL | Status: DC | PRN
  Filled 2020-03-02: qty 1

## 2020-03-02 MED ORDER — ASPIRIN 81 MG PO CHEW
81.0000 mg | CHEWABLE_TABLET | Freq: Every day | ORAL | Status: DC
  Administered 2020-03-03: 81 mg
  Filled 2020-03-02: qty 1

## 2020-03-02 MED ORDER — FAMOTIDINE 40 MG/5ML PO SUSR
20.0000 mg | Freq: Two times a day (BID) | ORAL | Status: DC
  Administered 2020-03-02 – 2020-03-03 (×3): 20 mg
  Filled 2020-03-02 (×4): qty 2.5

## 2020-03-02 MED ORDER — FENTANYL 2500MCG IN NS 250ML (10MCG/ML) PREMIX INFUSION
50.0000 ug/h | INTRAVENOUS | Status: DC
  Administered 2020-03-02: 50 ug/h via INTRAVENOUS
  Filled 2020-03-02: qty 250

## 2020-03-02 MED ORDER — INSULIN ASPART 100 UNIT/ML ~~LOC~~ SOLN
0.0000 [IU] | SUBCUTANEOUS | Status: DC
  Administered 2020-03-02: 21:00:00 3 [IU] via SUBCUTANEOUS
  Administered 2020-03-02: 05:00:00 11 [IU] via SUBCUTANEOUS
  Administered 2020-03-02 (×2): 5 [IU] via SUBCUTANEOUS
  Administered 2020-03-03: 3 [IU] via SUBCUTANEOUS
  Administered 2020-03-03 (×5): 2 [IU] via SUBCUTANEOUS
  Administered 2020-03-04 (×2): 3 [IU] via SUBCUTANEOUS
  Administered 2020-03-04: 10:00:00 2 [IU] via SUBCUTANEOUS
  Administered 2020-03-04 – 2020-03-06 (×7): 3 [IU] via SUBCUTANEOUS
  Administered 2020-03-06 (×2): 5 [IU] via SUBCUTANEOUS
  Administered 2020-03-07 (×2): 2 [IU] via SUBCUTANEOUS
  Administered 2020-03-07: 3 [IU] via SUBCUTANEOUS
  Administered 2020-03-07 – 2020-03-08 (×4): 2 [IU] via SUBCUTANEOUS

## 2020-03-02 MED ORDER — POLYETHYLENE GLYCOL 3350 17 G PO PACK: 17.0000 g | PACK | Freq: Every day | ORAL | Status: DC | PRN

## 2020-03-02 MED ORDER — SODIUM CHLORIDE 0.9 % IV SOLN: INTRAVENOUS | Status: DC

## 2020-03-02 MED ORDER — ENOXAPARIN SODIUM 100 MG/ML ~~LOC~~ SOLN
100.0000 mg | Freq: Two times a day (BID) | SUBCUTANEOUS | Status: DC
  Administered 2020-03-02 – 2020-03-04 (×6): 100 mg via SUBCUTANEOUS
  Filled 2020-03-02 (×8): qty 1

## 2020-03-02 MED ORDER — MIDAZOLAM HCL 2 MG/2ML IJ SOLN: 2.0000 mg | INTRAMUSCULAR | Status: DC | PRN

## 2020-03-02 MED ORDER — FENTANYL CITRATE (PF) 100 MCG/2ML IJ SOLN
100.0000 ug | Freq: Once | INTRAMUSCULAR | Status: AC
  Administered 2020-03-02: 100 ug via INTRAVENOUS
  Filled 2020-03-02: qty 2

## 2020-03-02 MED ORDER — MIDAZOLAM HCL 2 MG/2ML IJ SOLN
5.0000 mg | Freq: Once | INTRAMUSCULAR | Status: DC
  Filled 2020-03-02: qty 6

## 2020-03-02 MED ORDER — FENTANYL CITRATE (PF) 100 MCG/2ML IJ SOLN
50.0000 ug | Freq: Once | INTRAMUSCULAR | Status: AC
  Administered 2020-03-02: 50 ug via INTRAVENOUS
  Filled 2020-03-02: qty 2

## 2020-03-02 MED ORDER — DOCUSATE SODIUM 50 MG/5ML PO LIQD
100.0000 mg | Freq: Two times a day (BID) | ORAL | Status: DC
  Administered 2020-03-02 – 2020-03-03 (×3): 100 mg
  Filled 2020-03-02 (×4): qty 10

## 2020-03-02 MED ORDER — POLYETHYLENE GLYCOL 3350 17 G PO PACK
17.0000 g | PACK | Freq: Every day | ORAL | Status: DC
  Administered 2020-03-03: 17 g
  Filled 2020-03-02: qty 1

## 2020-03-02 MED ORDER — CARVEDILOL 3.125 MG PO TABS
3.1250 mg | ORAL_TABLET | Freq: Two times a day (BID) | ORAL | Status: DC
  Administered 2020-03-02 – 2020-03-08 (×12): 3.125 mg via ORAL
  Filled 2020-03-02 (×12): qty 1

## 2020-03-02 MED ORDER — COVID-19 MRNA VACC (MODERNA) 100 MCG/0.5ML IM SUSP
0.5000 mL | Freq: Once | INTRAMUSCULAR | Status: AC
  Filled 2020-03-02: qty 0.5

## 2020-03-02 MED ORDER — ENOXAPARIN SODIUM 40 MG/0.4ML ~~LOC~~ SOLN: 40.0000 mg | SUBCUTANEOUS | Status: DC

## 2020-03-02 NOTE — Progress Notes (Signed)
RN consulted for IVT to take out IO. IO previously placed was not in documented in flowsheet. IO taken out by this RN.  Penni Bombard Shelvy Perazzo,RN-VAST

## 2020-03-02 NOTE — ED Provider Notes (Signed)
[facility] EMERGENCY DEPARTMENT Provider Note   CSN: [id number] Arrival date & time: 03/02/20  0046   History No chief complaint on file.   [name] is a 65 y.o. male.  The history is provided by the EMS personnel. The history is limited by the condition of the patient (Patient unresponsive).  He has history of hypertension, diabetes, stroke, depression, substance abuse and is brought in by EMS following a witnessed arrest at home.  Bystander CPR was initiated immediately followed by fire rescue CPR.  Total amount of CPR was estimated at 7 minutes.  AED showed that a shock was not indicated.  When EMS arrived, they noted pulses present and discontinued CPR.  He never received defibrillation or ACLS drugs.  There was a report of some white powder having been found and is not certain whether he may have used any drugs.  EMS does report that patient started to get slightly agitated in route and the did have to sedate him with fentanyl and midazolam.  Past Medical History:  Diagnosis Date  . Depression   . Diabetes mellitus without complication (HCC)   . Hypertension   . Stroke [facility])     Patient Active Problem List   Diagnosis Date Noted  . Severe episode of recurrent major depressive disorder, without psychotic features (HCC)   . Opioid use disorder, severe, in sustained remission (HCC) 05/25/2015  . Benzodiazepine abuse (HCC) 05/25/2015  . Cannabis use disorder, moderate, dependence (HCC) 05/25/2015  . MDD (major depressive disorder), recurrent episode, severe (HCC) 05/23/2015  . Chronic pain syndrome 05/23/2015  . GAD (generalized anxiety disorder) 05/23/2015  . CVA (cerebral infarction) 10/15/2013  . Hypertension 10/15/2013  . Diabetes mellitus (HCC) 10/15/2013    Past Surgical History:  Procedure Laterality Date  . ABDOMINAL AORTOGRAM W/LOWER EXTREMITY Bilateral 05/02/2019   Procedure: ABDOMINAL AORTOGRAM W/LOWER EXTREMITY;  Surgeon: Maeola Harman, MD;  Location: [facility] INVASIVE CV LAB;  Service: Cardiovascular;  Laterality: Bilateral;       No family history on file.  Social History   Tobacco Use  . Smoking status: Current Every Day Smoker    Packs/day: 1.00    Types: Cigarettes  . Smokeless tobacco: Never Used  Substance Use Topics  . Alcohol use: No  . Drug use: Yes    Types: Marijuana, Cocaine    Home Medications Prior to Admission medications   Medication Sig Start Date End Date Taking? Authorizing Provider  ARISTADA 1064 MG/3.9ML PRSY Inject 1,064 mg into the skin See admin instructions. Every 2 months 04/07/19   [provider]  aspirin EC 81 MG tablet Take 81 mg by mouth daily.    [provider]  busPIRone (BUSPAR) 10 MG tablet Take 10 mg by mouth 2 (two) times daily.  04/24/19   [provider]  gabapentin (NEURONTIN) 800 MG tablet Take 800 mg by mouth in the morning, at noon, in the evening, and at bedtime.  04/24/19   [provider]  glipiZIDE (GLUCOTROL XL) 10 MG 24 hr tablet Take 10 mg by mouth daily. 04/24/19   [provider]  loratadine (CLARITIN) 10 MG tablet Take 10 mg by mouth daily. 04/24/19   [provider]  metFORMIN (GLUCOPHAGE) 850 MG tablet Take 850 mg by mouth 2 (two) times daily. 04/24/19   [provider]  mirtazapine (REMERON) 7.5 MG tablet Take 7.5 mg by mouth at bedtime. 04/24/19   [provider]  propranolol ER (INDERAL LA) 80  MG 24 hr capsule Take 80 mg by mouth daily. 04/24/19   [provider]  rosuvastatin (CRESTOR) 20 MG tablet Take 20 mg by mouth at bedtime. 04/24/19   [provider]  TRINTELLIX 10 MG TABS tablet Take 10 mg by mouth daily. 04/24/19   [provider]  XARELTO 10 MG TABS tablet Take 10 mg by mouth daily. 04/24/19   [provider]    Allergies    Patient has no known allergies.  Review of Systems   Review of Systems  Unable to perform ROS: Patient  unresponsive    Physical Exam Updated Vital Signs BP 109/75 (BP Location: Left Arm)   Pulse 96   Temp (!) 96.4 F (35.8 C) (Temporal)   Resp 16   SpO2 100%   Physical Exam Vitals and nursing note reviewed.   65 year old male, unresponsive with King airway in place. Vital signs are normal. Oxygen saturation is 100%, which is normal. Head is normocephalic and atraumatic.  Pupils are 3 mm and minimally reactive.  Corneal reflexes are present.  Neck has no adenopathy. Lungs: Symmetric air movement present. Chest moves symmetrically. Heart has regular rate and rhythm without murmur. Abdomen: Large ventral hernia present which is easily reducible. Extremities have no cyanosis or edema.  Intraosseous line present in the proximal left tibia. Neurologic: Unresponsive to verbal and painful stimuli.  Corneal reflex present but gag reflex absent.  Occasional attempts at spontaneous respiration.  No spontaneous movement.  GCS=3.  ED Results / Procedures / Treatments   Labs (all labs ordered are listed, but only abnormal results are displayed) Labs Reviewed  COMPREHENSIVE METABOLIC PANEL - Abnormal; Notable for the following components:      Result Value   Sodium 131 (*)    Chloride 96 (*)    Glucose, Bld 476 (*)    Creatinine, Ser 1.55 (*)    Calcium 8.7 (*)    Total Protein 6.4 (*)    Albumin 3.2 (*)    AST 138 (*)    ALT 99 (*)    GFR, Estimated 50 (*)    All other components within normal limits  LACTIC ACID, PLASMA - Abnormal; Notable for the following components:   Lactic Acid, Venous 3.2 (*)    All other components within normal limits  CBC WITH DIFFERENTIAL/PLATELET - Abnormal; Notable for the following components:   Abs Immature Granulocytes 0.10 (*)    All other components within normal limits  RAPID URINE DRUG SCREEN, HOSP PERFORMED - Abnormal; Notable for the following components:   Benzodiazepines POSITIVE (*)    Amphetamines POSITIVE (*)    Tetrahydrocannabinol  POSITIVE (*)    All other components within normal limits  URINALYSIS, ROUTINE W REFLEX MICROSCOPIC - Abnormal; Notable for the following components:   Specific Gravity, Urine 1.032 (*)    Glucose, UA >=500 (*)    Bacteria, UA RARE (*)    All other components within normal limits  BASIC METABOLIC PANEL - Abnormal; Notable for the following components:   Sodium 131 (*)    Chloride 97 (*)    CO2 20 (*)    Glucose, Bld 383 (*)    Creatinine, Ser 1.31 (*)    All other components within normal limits  PHOSPHORUS - Abnormal; Notable for the following components:   Phosphorus 4.8 (*)    All other components within normal limits  HEMOGLOBIN A1C - Abnormal; Notable for the following components:   Hgb A1c MFr Bld 11.3 (*)  All other components within normal limits  LACTIC ACID, PLASMA - Abnormal; Notable for the following components:   Lactic Acid, Venous 2.7 (*)    All other components within normal limits  CBG MONITORING, ED - Abnormal; Notable for the following components:   Glucose-Capillary 446 (*)    All other components within normal limits  CBG MONITORING, ED - Abnormal; Notable for the following components:   Glucose-Capillary 320 (*)    All other components within normal limits  I-STAT ARTERIAL BLOOD GAS, ED - Abnormal; Notable for the following components:   pCO2 arterial 48.8 (*)    pO2, Arterial 455 (*)    Bicarbonate 29.2 (*)    Acid-Base Excess 3.0 (*)    Sodium 130 (*)    All other components within normal limits  TROPONIN I (HIGH SENSITIVITY) - Abnormal; Notable for the following components:   Troponin I (High Sensitivity) 23 (*)    All other components within normal limits  TROPONIN I (HIGH SENSITIVITY) - Abnormal; Notable for the following components:   Troponin I (High Sensitivity) 102 (*)    All other components within normal limits  RESP PANEL BY RT-PCR (FLU A&B, COVID) ARPGX2  ETHANOL  MAGNESIUM  HIV ANTIBODY (ROUTINE TESTING W REFLEX)  LACTIC ACID, PLASMA   BLOOD GAS, ARTERIAL    EKG EKG Interpretation  Date/Time:  Friday March 02 2020 00:51:11 EST Ventricular Rate:  99 PR Interval:    QRS Duration: 105 QT Interval:  329 QTC Calculation: 423 R Axis:   83 Text Interpretation: Sinus rhythm LAE, consider biatrial enlargement Borderline right axis deviation Anteroseptal infarct, old Repol abnrm, severe global ischemia (LM/MVD) When compared with ECG of 04/23/2019, ST abnormality is more pronounced Confirmed by Dione Booze ([id number]) on 03/02/2020 12:56:30 AM   Radiology CT Head Wo Contrast  Result Date: 03/02/2020 CLINICAL DATA:  Syncope, altered mental status EXAM: CT HEAD WITHOUT CONTRAST TECHNIQUE: Contiguous axial images were obtained from the base of the skull through the vertex without intravenous contrast. COMPARISON:  MRI 10/17/2013 FINDINGS: Brain: Normal anatomic configuration. Parenchymal volume loss is commensurate with the patient's age. Mild periventricular white matter changes are present likely reflecting the sequela of small vessel ischemia. Remote bilateral cerebellar, left lentiform nucleus, and right pontine infarcts are identified. No abnormal intra or extra-axial mass lesion or fluid collection. No abnormal mass effect or midline shift. No evidence of acute intracranial hemorrhage or infarct. Ventricular size is normal. Cerebellum unremarkable. Vascular: Extensive atherosclerotic calcification is seen within the distal vertebral arteries and the basilar artery appears diminutive. Moderate atherosclerotic calcification noted within the carotid siphons bilaterally. Skull: Intact Sinuses/Orbits: There is moderate mucosal thickening within the right maxillary sinus. Remaining paranasal sinuses are clear. The orbits are unremarkable. Other: Mastoid air cells and middle ear cavities are clear. IMPRESSION: Remote left basal ganglia, pontine, and bilateral cerebellar infarcts. No evidence of acute intracranial hemorrhage or infarct. Extensive  intracranial atherosclerotic disease. Moderate right maxillary paranasal sinus disease. Electronically Signed   By: Helyn Numbers MD   On: 03/02/2020 02:10   DG Chest Port 1 View  Result Date: 03/02/2020 CLINICAL DATA:  Respiratory failure EXAM: PORTABLE CHEST 1 VIEW COMPARISON:  09/11/2017 FINDINGS: Endotracheal tube is seen at the level of the clavicular heads, 6.3 cm above the carina. Nasogastric tube extends into the upper abdomen beyond the margin of the examination. Lungs are clear. No pneumothorax or pleural effusion. Cardiac size within normal limits. Pulmonary vascularity is normal. IMPRESSION: Endotracheal tube 6.3 cm above the  carina. Nasogastric tube extends into the upper abdomen. No radiographic evidence of acute cardiopulmonary disease. Electronically Signed   By: Helyn Numbers MD   On: 03/02/2020 01:27    Procedures Date/Time: 03/02/2020 1:10 AM Performed by: Dione Booze, MD Pre-anesthesia Checklist: Patient identified, Emergency Drugs available, Suction available, Patient being monitored and Timeout performed Oxygen Delivery Method: Ambu bag Preoxygenation: Pre-oxygenation with 100% oxygen Laryngoscope Size: Glidescope and 3 Grade View: Grade I Tube size: 7.5 mm Number of attempts: 1 Airway Equipment and Method: Rigid stylet and Video-laryngoscopy Placement Confirmation: ETT inserted through vocal cords under direct vision,  CO2 detector and Breath sounds checked- equal and bilateral Secured at: 25 cm Tube secured with: ETT holder Dental Injury: Teeth and Oropharynx as per pre-operative assessment      CRITICAL CARE Performed by: Dione Booze Total critical care time: 85 minutes Critical care time was exclusive of separately billable procedures and treating other patients. Critical care was necessary to treat or prevent imminent or life-threatening deterioration. Critical care was time spent personally by me on the following activities: development of treatment plan  with patient and/or surrogate as well as nursing, discussions with consultants, evaluation of patient's response to treatment, examination of patient, obtaining history from patient or surrogate, ordering and performing treatments and interventions, ordering and review of laboratory studies, ordering and review of radiographic studies, pulse oximetry and re-evaluation of patient's condition.  Medications Ordered in ED Medications  midazolam (VERSED) injection 5 mg (0 mg Intravenous Hold 03/02/20 0117)  docusate sodium (COLACE) capsule 100 mg (has no administration in time range)  polyethylene glycol (MIRALAX / GLYCOLAX) packet 17 g (has no administration in time range)  famotidine (PEPCID) 40 MG/5ML suspension 20 mg (20 mg Per Tube Given 03/02/20 0629)  ondansetron (ZOFRAN) injection 4 mg (has no administration in time range)  insulin aspart (novoLOG) injection 0-15 Units (11 Units Subcutaneous Given 03/02/20 0430)  0.9 %  sodium chloride infusion ( Intravenous New Bag/Given 03/02/20 0407)  docusate (COLACE) 50 MG/5ML liquid 100 mg (100 mg Per Tube Given 03/02/20 0629)  polyethylene glycol (MIRALAX / GLYCOLAX) packet 17 g (has no administration in time range)  fentaNYL in NS (51mcg/ml) infusion-PREMIX (50 mcg/hr Intravenous New Bag/Given 03/02/20 0406)  fentaNYL (SUBLIMAZE) bolus via infusion 50 mcg (has no administration in time range)  midazolam (VERSED) injection 2 mg (has no administration in time range)  midazolam (VERSED) injection 2 mg (has no administration in time range)  enoxaparin (LOVENOX) injection 100 mg (100 mg Subcutaneous Given 03/02/20 0407)  aspirin chewable tablet 81 mg (has no administration in time range)  fentaNYL (SUBLIMAZE) injection 100 mcg (100 mcg Intravenous Given 03/02/20 0148)  fentaNYL (SUBLIMAZE) injection 50 mcg (50 mcg Intravenous Given 03/02/20 0406)    ED Course  I have reviewed the triage vital signs and the nursing notes.  Pertinent labs & imaging results  that were available during my care of the patient were reviewed by me and considered in my medical decision making (see chart for details).  MDM Rules/Calculators/A&P Loss of consciousness with CPR initiated.  The fact that spontaneous circulation was achieved without medication or electricity suggests that he never truly lost circulation.  However, GCS is 3.  Question whether this is primarily an intracerebral event.  He was promptly intubated on arrival and screening labs obtained and he will be sent for CT of head.  ECG shows some ST depression which is slightly more pronounced than prior ECG.  Old records do show a  prior admission for drug overdose and drug overdose certainly is a possibility today.  Drug screen has been sent.  CT of head shows no acute process, chest x-ray shows good position of endotracheal tube.  Labs today show hyperglycemia and mild renal insufficiency.  Patient has not shown any improvement in mentation.  Case is discussed with the intensivist who agrees to admit the patient.  Final Clinical Impression(s) / ED Diagnoses Final diagnoses:  Acute respiratory failure with hypoxia (HCC)  Acute encephalopathy  Hyperglycemia  Hyponatremia  Renal insufficiency    Rx / DC Orders ED Discharge Orders    None       Dione Booze, MD 03/02/20 [id number]

## 2020-03-02 NOTE — ED Triage Notes (Signed)
Pt BIB [facility] as post-CPR. Witnessed arrest by family, states that pt "locked up" & went down. CPR began immediately, total by all providers; ROSC achieved w strong carotid pulse, pt agonally breathing. King airway placed en route, fentanyl & versed used for sedation  IO in L shin EDP at bedside on arrival

## 2020-03-02 NOTE — Procedures (Signed)
Extubation Procedure Note  Patient Details:   Name: [name] DOB: January 12, 1956 MRN: [id number]   Airway Documentation:    Vent end date: 03/02/20 Vent end time: 1408   Evaluation  O2 sats: stable throughout Complications: No apparent complications Patient did tolerate procedure well. Bilateral Breath Sounds: Diminished   Yes   Pt exubated to 4L [place] per MD order. Pt stable throughout with no complications. Pt able to speak and has strong congested cough post  Extubation. Pt encouraged to use [place] to clear secretions. VS WNL. RT will continue to monitor pt  Carolan Shiver 03/02/2020, 2:08 PM

## 2020-03-02 NOTE — ED Notes (Signed)
Pharmacy messaged regarding missing dose of Pepcid & Colace

## 2020-03-02 NOTE — Consult Note (Addendum)
Cardiology Consultation:   Patient ID: [name]; [id number]; Sep 08, 1955   Admit date: 03/02/2020 Date of Consult: 03/02/2020  Primary Care Provider: [name], NP Primary Cardiologist: New to [facility]  Patient Profile:   [name] is a 65 y.o. male with a hx of CAD s/p CABG of unknown date, CVA, DM2, HTN, depression and hx of illicit drug use who is being seen today for the evaluation of  at the request of Merlene Laughter NP-C.   History of Present Illness:   [name] is a 65yo M with a hx as stated above who presented to the ED after a witnessed respiratory versus cardiac arrest.  HPI obtained through chart review as the patient does not recall any events leading to his hospitalization.  Per chart review, he was with his family at which time he fell and was unresponsive. CPR was initiated immediately. At EMS arrival, he was agonal breathing and was intubated on site. ROSC obtained without shock or ACLS. He was then transferred to the ED for further evaluation.     EKG without acute changes. UDS positive for amphetamines, benzodiazepines and THC.  Hemoglobin A1c found to be elevated at 11.3.  CXR with no evidence of acute cardiopulmonary disease.  Head CT obtained which showed no evidence of acute intracranial hemorrhage or infarct.  Unfortunately, echocardiogram performed today shows an LVEF at 45 to 50% with global hypokinesis, G1 DD and mild aortic root dilation at 42 mm.  Last echocardiogram per chart review from 10/16/2013 with LVEF of 50 to 55% with no regional wall motion abnormalities and G1 DD.  Patient reports he has a history of CAD with CABG performed in 1992 at [facility].  He has had no regular cardiology follow-up and reports no follow-up LHC or stress testing since his surgery. He does state he follows with his PCP on a regular basis.  He was last seen 2 months prior.  He denies recent angina, shortness of breath, LE edema, orthopnea,  palpitations, PND, dizziness or syncope.  He lives alone and has no wife or children.  He states he has a stepdaughter whom he keeps some close contact with.  He continues to smoke cigarettes and marijuana on a regular basis.  Reports trying methamphetamines however does not recall recent use.    Past Medical History:  Diagnosis Date  . Depression   . Diabetes mellitus without complication ([place])   . Hypertension   . Stroke [facility])     Past Surgical History:  Procedure Laterality Date  . ABDOMINAL AORTOGRAM W/LOWER EXTREMITY Bilateral 05/02/2019   Procedure: ABDOMINAL AORTOGRAM W/LOWER EXTREMITY;  Surgeon: Waynetta Sandy, MD;  Location: [place] [facility];  Service: Cardiovascular;  Laterality: Bilateral;     Prior to Admission medications   Medication Sig Start Date End Date Taking? Authorizing Provider  ARISTADA 1064 MG/3.9ML PRSY Inject 1,064 mg into the skin See admin instructions. Every 2 months 04/07/19   [provider]  aspirin EC 81 MG tablet Take 81 mg by mouth daily.    [provider]  busPIRone (BUSPAR) 10 MG tablet Take 10 mg by mouth 2 (two) times daily.  04/24/19   [provider]  gabapentin (NEURONTIN) 800 MG tablet Take 800 mg by mouth in the morning, at noon, in the evening, and at bedtime.  04/24/19   [provider]  glipiZIDE (GLUCOTROL XL) 10 MG 24 hr tablet Take 10 mg by mouth daily. 04/24/19  [provider]  loratadine (CLARITIN) 10 MG tablet Take 10 mg by mouth daily. 04/24/19   [provider]  metFORMIN (GLUCOPHAGE) 850 MG tablet Take 850 mg by mouth 2 (two) times daily. 04/24/19   [provider]  mirtazapine (REMERON) 7.5 MG tablet Take 7.5 mg by mouth at bedtime. 04/24/19   [provider]  propranolol ER (INDERAL LA) 80 MG 24 hr capsule Take 80 mg by mouth daily. 04/24/19   [provider]  rosuvastatin (CRESTOR) 20 MG tablet Take 20 mg by mouth at bedtime. 04/24/19   [provider]  TRINTELLIX 10 MG TABS tablet Take 10 mg by mouth daily. 04/24/19   [provider]  XARELTO 10 MG TABS tablet Take 10 mg by mouth daily. 04/24/19   [provider]    Inpatient Medications: Scheduled Meds: . aspirin  81 mg Per Tube Daily  . docusate  100 mg Per Tube BID  . enoxaparin (LOVENOX) injection  100 mg Subcutaneous Q12H  . famotidine  20 mg Per Tube BID  . insulin aspart  0-15 Units Subcutaneous Q4H  . polyethylene glycol  17 g Per Tube Daily   Continuous Infusions: . sodium chloride 75 mL/hr at 03/02/20 1408   PRN Meds: docusate sodium, ondansetron (ZOFRAN) IV, polyethylene glycol  Allergies:   No Known Allergies  Social History:   Social History   Socioeconomic History  . Marital status: Divorced    Spouse name: Not on file  . Number of children: Not on file  . Years of education: Not on file  . Highest education level: Not on file  Occupational History  . Not on file  Tobacco Use  . Smoking status: Current Every Day Smoker    Packs/day: 1.00    Types: Cigarettes  . Smokeless tobacco: Never Used  Substance and Sexual Activity  . Alcohol use: No  . Drug use: Yes    Types: Marijuana, Cocaine  . Sexual activity: Not on file    Comment: week ago  Other Topics Concern  . Not on file  Social History Narrative  . Not on file   Social Determinants of Health   Financial Resource Strain: Not on file  Food Insecurity: Not on file  Transportation Needs: Not on file  Physical Activity: Not on file  Stress: Not on file  Social Connections: Not on file  Intimate Partner Violence: Not on file    Family History:   No family history on file. Family Status:  No family status information on file.    ROS:  Please see the history of present illness.  All other ROS reviewed and negative.     Physical Exam/Data:   Vitals:   03/02/20 1301 03/02/20 1400 03/02/20 1407 03/02/20 1515  BP: 129/77 (!) 144/92  116/75  Pulse: 97 100   (!) 104  Resp: 12 13  18   Temp: 100 F (37.8 C) (!) 100.4 F (38 C)  (!) 101.2 F (38.4 C)  TempSrc:      SpO2: 100% 100% 100% 100%  Weight:      Height:       No intake or output data in the 24 hours ending 03/02/20 1545 Filed Weights   03/02/20 0306  Weight: 102.1 kg   Body mass index is 34.21 kg/m.   General: Appears older than stated age, NAD Neck: Negative for carotid bruits. No JVD Lungs:Clear to ausculation bilaterally. No wheezes, rales, or rhonchi. Breathing is unlabored. Cardiovascular: RRR with  S1 S2. No murmurs Abdomen: Soft, non-tender, non-distended. No obvious abdominal masses. Extremities: No edema.  Radial pulses 2+ bilaterally Neuro: Alert and oriented. No focal deficits. No facial asymmetry. MAE spontaneously. Psych: Responds to questions appropriately with normal affect.     EKG:  The EKG was personally reviewed and demonstrates: NSR with global early repolarization abnormality more pronounced when compared to EKG from 04/23/2019. Telemetry:  Telemetry was personally reviewed and demonstrates: NSR  Relevant CV Studies:  Echocardiogram 03/02/2020: 1. Left ventricular ejection fraction, by estimation, is 45 to 50%. The  left ventricle has mildly decreased function. The left ventricle  demonstrates global hypokinesis. There is mild left ventricular  hypertrophy. Left ventricular diastolic parameters  are consistent with Grade I diastolic dysfunction (impaired relaxation).  2. Right ventricular systolic function is mildly reduced. The right  ventricular size is normal. Tricuspid regurgitation signal is inadequate  for assessing PA pressure.  3. The mitral valve is normal in structure. No evidence of mitral valve  regurgitation. No evidence of mitral stenosis.  4. The aortic valve is tricuspid. Aortic valve regurgitation is not  visualized. No aortic stenosis is present.  5. Aortic dilatation noted. There is mild dilatation of the aortic root,   measuring 42 mm.  6. The inferior vena cava is normal in size with greater than 50%  respiratory variability, suggesting right atrial pressure of 3 mmHg.  CATH:   Laboratory Data:  Chemistry Recent Labs  Lab 03/02/20 0058 03/02/20 0358 03/02/20 0433  NA 131* 131* 130*  K 3.9 4.7 4.4  CL 96* 97*  --   CO2 22 20*  --   GLUCOSE 476* 383*  --   BUN 15 14  --   CREATININE 1.55* 1.31*  --   CALCIUM 8.7* 9.0  --   GFRNONAA 50* >60  --   ANIONGAP 13 14  --     Total Protein  Date Value Ref Range Status  03/02/2020 6.4 (L) 6.5 - 8.1 g/dL Final   Albumin  Date Value Ref Range Status  03/02/2020 3.2 (L) 3.5 - 5.0 g/dL Final   AST  Date Value Ref Range Status  03/02/2020 138 (H) 15 - 41 U/L Final   ALT  Date Value Ref Range Status  03/02/2020 99 (H) 0 - 44 U/L Final   Alkaline Phosphatase  Date Value Ref Range Status  03/02/2020 80 38 - 126 U/L Final   Total Bilirubin  Date Value Ref Range Status  03/02/2020 0.5 0.3 - 1.2 mg/dL Final   Hematology Recent Labs  Lab 03/02/20 0058 03/02/20 0433  WBC 9.6  --   RBC 5.16  --   HGB 15.7 14.6  HCT 45.4 43.0  MCV 88.0  --   MCH 30.4  --   MCHC 34.6  --   RDW 13.2  --   PLT 244  --    Cardiac EnzymesNo results for input(s): TROPONINI in the last 168 hours. No results for input(s): TROPIPOC in the last 168 hours.  BNPNo results for input(s): BNP, PROBNP in the last 168 hours.  DDimer No results for input(s): DDIMER in the last 168 hours. TSH:  Lab Results  Component Value Date   TSH 1.887 05/26/2015   Lipids: Lab Results  Component Value Date   CHOL 279 (H) 05/25/2015   HDL 31 (L) 05/25/2015   LDLCALC 204 (H) 05/25/2015   TRIG 219 (H) 05/25/2015   CHOLHDL 9.0 05/25/2015   HgbA1c: Lab Results  Component Value Date  HGBA1C 11.3 (H) 03/02/2020    Radiology/Studies:  CT Head Wo Contrast  Result Date: 03/02/2020 CLINICAL DATA:  Syncope, altered mental status EXAM: CT HEAD WITHOUT CONTRAST TECHNIQUE:  Contiguous axial images were obtained from the base of the skull through the vertex without intravenous contrast. COMPARISON:  MRI 10/17/2013 FINDINGS: Brain: Normal anatomic configuration. Parenchymal volume loss is commensurate with the patient's age. Mild periventricular white matter changes are present likely reflecting the sequela of small vessel ischemia. Remote bilateral cerebellar, left lentiform nucleus, and right pontine infarcts are identified. No abnormal intra or extra-axial mass lesion or fluid collection. No abnormal mass effect or midline shift. No evidence of acute intracranial hemorrhage or infarct. Ventricular size is normal. Cerebellum unremarkable. Vascular: Extensive atherosclerotic calcification is seen within the distal vertebral arteries and the basilar artery appears diminutive. Moderate atherosclerotic calcification noted within the carotid siphons bilaterally. Skull: Intact Sinuses/Orbits: There is moderate mucosal thickening within the right maxillary sinus. Remaining paranasal sinuses are clear. The orbits are unremarkable. Other: Mastoid air cells and middle ear cavities are clear. IMPRESSION: Remote left basal ganglia, pontine, and bilateral cerebellar infarcts. No evidence of acute intracranial hemorrhage or infarct. Extensive intracranial atherosclerotic disease. Moderate right maxillary paranasal sinus disease. Electronically Signed   By: Helyn Numbers MD   On: 03/02/2020 02:10   DG Chest Port 1 View  Result Date: 03/02/2020 CLINICAL DATA:  Respiratory failure EXAM: PORTABLE CHEST 1 VIEW COMPARISON:  09/11/2017 FINDINGS: Endotracheal tube is seen at the level of the clavicular heads, 6.3 cm above the carina. Nasogastric tube extends into the upper abdomen beyond the margin of the examination. Lungs are clear. No pneumothorax or pleural effusion. Cardiac size within normal limits. Pulmonary vascularity is normal. IMPRESSION: Endotracheal tube 6.3 cm above the carina. Nasogastric  tube extends into the upper abdomen. No radiographic evidence of acute cardiopulmonary disease. Electronically Signed   By: Helyn Numbers MD   On: 03/02/2020 01:27   Assessment and Plan:   1.  Out of hospital cardiac versus respiratory arrest with known history of CAD s/p remote CABG in 1992: -Patient presented after a witnessed cardiac arrest requiring CPR and intubation with ROSC without shock.  EKG on arrival with moderate repolarization abnormalities when compared to prior EKG from 04/23/2019.  Patient has a history of remote CABG from 1992 and does not follow regularly with cardiology with no postsurgical stress testing or repeat LHC.   -Echocardiogram from 2015 with LVEF at 50 to 55% and G1 DD.   -Echocardiogram this admission with mildly reduced LV function to 45 to 50% with global hypokinesis and G1 DD with mild aortic root dilation at 42 mm.  -HST initially found to be 23 however has elevated with a peak of 463. Elevation could certainly be in the setting of CPR however cannot fully rule this out given CAD hx. Patient does not recall the events leading up to his hospitalization however clearly denies recent angina, shortness of breath, palpitations. -To rule out cardiac etiology, would recommend further ischemic evaluation with LHC however will discuss further with MD.  -Continue PTA ASA 81, Crestor 20 -Add carvedilol 3.125mg   -Will also need to consider adding Heparin infusion if presumed   2.  Acute hypoxic respiratory failure secondary to cardiac arrest: -Patient found to be unresponsive with agonal breathing on EMS presentation intubated on scene however was extubated in the ED prior to bed placement  -Continues to be mildly hypoxic on RA during assessment to the 88-89% range -Add supplemental O2  and monitor  -Management per CCM  3.  DM2: -Uncontrolled with a hemoglobin A1c at 11.3 -On PTA metformin, glipizide -SSI for glucose control inpatient status -Needs close PCP follow-up  with consideration for SGLT2  4.  HTN: -Stable, 116/75>>144/92>>129/77 -On PTA propanolol ER 80 mg daily -Given CAD history, will add carvedilol  5.  Ongoing tobacco use: -Reports ongoing tobacco use with no consideration for quitting -Cessation strongly encouraged  6.  Illicit drug use: -UDS positive for THC, benzodiazepines and amphetamines -Cessation strongly encouraged  7. Chronic diastolic CHF: -Echo with mild change in LVEF from 50-55 to 45-50% with G1DD -Appears euvolemic on exam today   8. CKD stage II-III: -Cr on admission at 1.55 which has improved slightly to 1.31 today  -Baseline appears to be in the 1.1-1.2 range (remote)  9. Hx of CVA: -Previously noted to be on Xarelto per chart review   10. Aortic root and abdominal aneurysm: -Followed by [facility] for which he has undergone abdominal aorta gram with lower extrmity per Dr. Randie Heinz 05/02/2019 that showed no flow limiting stenosis in the aorta or iliacs however the SFA showed occlusions. Plan was for ABIs and vein mapping and if further lower extremity pain, then consider left femoral to posterior tibial artery bypass   For questions or updates, please contact [facility] Please consult [email or web address] for contact info under Cardiology/STEMI.   SignedGeorgie Chard NP-C [facility] Pager: [phone] 03/02/2020 3:45 PM  Patient seen and examined   I agree with findings as noted above by Arelia Longest  Pt is 65 yo with hx of DM, substance abuse and CAD He underwent remote CABG   He does not remember having angina  Thinks he might have had a stress test at some point   Not seen in cardiology for awhile.   Admitted after being found down    Pt does not remember what happened prior.  CPR initiated as outpt  Pt with agonal respirations when EMS arrived   The pt was admitted to [facility].  Initially intubated then extubated  Admtted to telemetry    Peak troponin 463  UDS screen + for benzo, cannibis and amphetiamines  (despite pt  denying using amphetamines for months)    ON exam pt in NAD  HR 110s   (ST)  Neck:  JVP is normal Lungs are relatively clear Cardiac exam:  RRR  No S3    No significant murmurs Abd is benign  Ext without edema    Echo with mild LV/ RV dysfunction   REcomm:   Given undefined event, pt's hx of remote CABG and lack of memory for any symptoms I would recomm L heart catheterization to r/o flow limiting CAD that could induce arrhythmia/arrest.  WIll need records from [facility] re details of CABG  For now he appears comfortable though he remains persistently tachycardic   Mild sinus tachycardia   Watch for for signs of withdrawal given hx of polysubstance abuse.  Dietrich Pates MD

## 2020-03-02 NOTE — Progress Notes (Signed)
PCCM Progress Note  Patient was successfully extubated this afternoon and is currently on 3L [place] and tolerating that well.   On extubation patient was asked further history questions and he stated that he did not take any drugs or medication in an attempt to harm himself or commit suicide.    He states he dissent recall many of the events prior to hospitalization. On further questions he did report he used mariajuana prior to admission. He also states he has used meth in the past but not recently. The last time was I month prior to admission. He denies any prescribed or recreations use of benzos. Thus additional history leads to a primary respiratory arrest that lead to a cardiac arrest.   Additionally patient is followed by an outpatient care management service [facility]. They are his primary contacts and help manage his medical conditions at baseline. They request copies of his health records at discharge. The Fax number is [phone] and phone number is [phone]  Delfin Gant, NP-C [facility] Contact / Pager information can be found on Amion  03/02/2020, 2:51 PM

## 2020-03-02 NOTE — H&P (Signed)
NAME:  [name], MRN:  [id number], DOB:  09/22/1955, LOS: 0 ADMISSION DATE:  03/02/2020, CONSULTATION DATE:  03/02/20 REFERRING MD:  Dr Preston Fleeting CHIEF COMPLAINT:  Post arrest  Brief History   65 yo brought in by ems after witnessed arrest 7 mins cpr to ROSC  History of present illness   65 yo brought in by ems after witnessed arrest. Pt was with family when he "suddenly locked up" and fell down. cpr was initiated immediately. When ems arrives pt was agonal with respirations but strong carotid pulse. King airway was placed along with IO in L shin and he was transported to [facility].   Pt is unresponsive and sedated on vent. All history is obtained from chart and ed physician. Pmh is significant for htn, dm2, h/o cva and depression. There is a report of a white powder substance near the pt where he was found down.   Pt achieved ROSC without shock or acls medications per ems.   Upon arrival to ed he was unresponsive with gcs 3. When he was transported to ct he began to move spontaneously req some sedation.   Upon my exam in the emergency department pt is awake following commands, nodding and shaking head to questions appropriately. He is not on sedation, hemodynamically stable but on escalated vent settings.   He denies pain     Past Medical History   Past Medical History:  Diagnosis Date  . Depression   . Diabetes mellitus without complication (HCC)   . Hypertension   . Stroke [facility])     Significant Hospital Events   1/7 admitted post witnessed arrest  Consults:  Ccm 1/7  Procedures:    Significant Diagnostic Tests:  1/7 cth: Remote left basal ganglia, pontine, and bilateral cerebellar infarcts. No evidence of acute intracranial hemorrhage or infarct.  Extensive intracranial atherosclerotic disease.  Moderate right maxillary paranasal sinus disease.  Micro Data:    Antimicrobials:  none  Interim history/subjective:  As above  Objective   Blood pressure  117/85, pulse 92, temperature (!) 96.5 F (35.8 C), resp. rate 18, height 5\' 8"  (1.727 m), SpO2 100 %.    Vent Mode: PRVC FiO2 (%):  [100 %] 100 % Set Rate:  [18 bmp] 18 bmp Vt Set:  [550 mL] 550 mL PEEP:  [5 cmH20] 5 cmH20 Plateau Pressure:  [15 cmH20] 15 cmH20  No intake or output data in the 24 hours ending 03/02/20 0257 There were no vitals filed for this visit.  Examination: General: no acute distress, awake and following commands on ventilator HEENT: NCAT, EOMI, PERRLA, MMMP Lungs: diminished bilaterally Cardiovascular: RRR, no m/g/r, well healed sternal incision noted Abdomen: soft, NT,ND, BS+, ventral hernia that is soft and easily reducible Extremities: no c/c/e but unkempt Skin: no rashes, warm and dry Neuro: moves all 4 extremities to command GU: deferred  Resolved Hospital Problem list     Assessment & Plan:  [place]:  -s/p rosc -unknown etiology -ekg without acute changes -monitor on tele -awakens with purposeful movement so no indication for TTM -echo -possible cards consult in am, pt has sternal incision that is well healed indicated either valvular replacement or cabg but unclear from chart reivew.  -uds pending  Acute hypoxic resp failure:  -2/2 recent arrest -no acute infiltrates on cxr -titrate vent -sat/sbt in am if able  Acute encephalopathy:  -s/p oohca -cth negative for acute. -pt is awake now and following commands  DM2 with hyperglycemia:  -monitor and ssi, may  need to progress to infusion if unable to improve -a1c pending  H/o htn:  -holding home agents at this time.   H/o cva:  -cont aspirin -on home xarelto but this will be held at this time pending further eval and potential need for procedures.  -no acute changes on cth  Hyponatremia/hypochloremia:  -ivf arf Lactic acidosis:  -monitor uop and indices -trend lactate for improvement -cont hydration, if no improvement would order renal studies and renal u/s  Transaminitis:   -indicative of chronic etoh use but level was negative -follow  Best practice:  Diet: npo Pain/Anxiety/Delirium protocol (if indicated): per protocol VAP protocol (if indicated): per protocol DVT prophylaxis: therapeutic lovenox until further procedures determined ( on home xarelto) GI prophylaxis: famotidine Glucose control: ssi Mobility: bedrest Code Status: full Family Communication: unable to reach Disposition: ICU  Labs   CBC: Recent Labs  Lab 03/02/20 0058  WBC 9.6  NEUTROABS 7.1  HGB 15.7  HCT 45.4  MCV 88.0  PLT 347    Basic Metabolic Panel: Recent Labs  Lab 03/02/20 0058  NA 131*  K 3.9  CL 96*  CO2 22  GLUCOSE 476*  BUN 15  CREATININE 1.55*  CALCIUM 8.7*   GFR: CrCl cannot be calculated (Unknown ideal weight.). Recent Labs  Lab 03/02/20 0058  WBC 9.6  LATICACIDVEN 3.2*    Liver Function Tests: Recent Labs  Lab 03/02/20 0058  AST 138*  ALT 99*  ALKPHOS 80  BILITOT 0.5  PROT 6.4*  ALBUMIN 3.2*   No results for input(s): LIPASE, AMYLASE in the last 168 hours. No results for input(s): AMMONIA in the last 168 hours.  ABG    Component Value Date/Time   TCO2 28 05/02/2019 0941     Coagulation Profile: No results for input(s): INR, PROTIME in the last 168 hours.  Cardiac Enzymes: No results for input(s): CKTOTAL, CKMB, CKMBINDEX, TROPONINI in the last 168 hours.  HbA1C: Hgb A1c MFr Bld  Date/Time Value Ref Range Status  05/25/2015 06:30 AM 9.4 (H) 4.8 - 5.6 % Final    Comment:    (NOTE)         Pre-diabetes: 5.7 - 6.4         Diabetes: >6.4         Glycemic control for adults with diabetes: <7.0   10/17/2013 03:50 AM 6.3 (H) <5.7 % Final    Comment:    (NOTE)                                                                       According to the ADA Clinical Practice Recommendations for 2011, when HbA1c is used as a screening test:  >=6.5%   Diagnostic of Diabetes Mellitus           (if abnormal result is  confirmed) 5.7-6.4%   Increased risk of developing Diabetes Mellitus References:Diagnosis and Classification of Diabetes Mellitus,Diabetes QQVZ,5638,75(IEPPI 1):S62-S69 and Standards of Medical Care in         Diabetes - 2011,Diabetes RJJO,8416,60 (Suppl 1):S11-S61.    CBG: Recent Labs  Lab 03/02/20 0101  GLUCAP 446*    Review of Systems:   Unobtainable 2/2 intubation but is able to shake head to pain.   Past Medical  History  He,  has a past medical history of Depression, Diabetes mellitus without complication (HCC), Hypertension, and Stroke (HCC).   Surgical History    Past Surgical History:  Procedure Laterality Date  . ABDOMINAL AORTOGRAM W/LOWER EXTREMITY Bilateral 05/02/2019   Procedure: ABDOMINAL AORTOGRAM W/LOWER EXTREMITY;  Surgeon: Maeola Harman, MD;  [facility] INVASIVE CV LAB;  Service: Cardiovascular;  Laterality: Bilateral;     Social History   reports that he has been smoking cigarettes. He has been smoking about 1.00 pack per day. He has never used smokeless tobacco. He reports current drug use. Drugs: Marijuana and Cocaine. He reports that he does not drink alcohol.   Family History   His family history is not on file.   Allergies No Known Allergies   Home Medications  Prior to Admission medications   Medication Sig Start Date End Date Taking? Authorizing Provider  ARISTADA 1064 MG/3.9ML PRSY Inject 1,064 mg into the skin See admin instructions. Every 2 months 04/07/19   [provider]  aspirin EC 81 MG tablet Take 81 mg by mouth daily.    [provider]  busPIRone (BUSPAR) 10 MG tablet Take 10 mg by mouth 2 (two) times daily.  04/24/19   [provider]  gabapentin (NEURONTIN) 800 MG tablet Take 800 mg by mouth in the morning, at noon, in the evening, and at bedtime.  04/24/19   [provider]  glipiZIDE (GLUCOTROL XL) 10 MG 24 hr tablet Take 10 mg by mouth daily. 04/24/19   [provider]   loratadine (CLARITIN) 10 MG tablet Take 10 mg by mouth daily. 04/24/19   [provider]  metFORMIN (GLUCOPHAGE) 850 MG tablet Take 850 mg by mouth 2 (two) times daily. 04/24/19   [provider]  mirtazapine (REMERON) 7.5 MG tablet Take 7.5 mg by mouth at bedtime. 04/24/19   [provider]  propranolol ER (INDERAL LA) 80 MG 24 hr capsule Take 80 mg by mouth daily. 04/24/19   [provider]  rosuvastatin (CRESTOR) 20 MG tablet Take 20 mg by mouth at bedtime. 04/24/19   [provider]  TRINTELLIX 10 MG TABS tablet Take 10 mg by mouth daily. 04/24/19   [provider]  XARELTO 10 MG TABS tablet Take 10 mg by mouth daily. 04/24/19   [provider]     Critical care time: The patient is critically ill with multiple organ systems failure and requires high complexity decision making for assessment and support, frequent evaluation and titration of therapies, application of advanced monitoring technologies and extensive interpretation of multiple databases.  Critical care time 39 mins. This represents my time independent of the NP's/PA's/med students/residents time taking care of the pt. This is excluding procedures.     Briant Sites DO [facility] 03/02/2020, 2:57 AM

## 2020-03-02 NOTE — Progress Notes (Signed)
  Echocardiogram 2D Echocardiogram has been performed.  [name] 03/02/2020, 11:53 AM

## 2020-03-02 NOTE — ED Notes (Addendum)
Pt woke up after being transferred to CT table. fentanyl given for sedation

## 2020-03-02 NOTE — Progress Notes (Signed)
NAME:  [name], MRN:  [id number], DOB:  November 26, 1955, LOS: 0 ADMISSION DATE:  03/02/2020, CONSULTATION DATE:  03/02/20 REFERRING MD:  Dr Preston Fleeting CHIEF COMPLAINT:  Post arrest  Brief History   65 yo brought in by ems after witnessed arrest 7 mins cpr to ROSC  History of present illness   65 yo brought in by ems after witnessed arrest. Pt was with family when he "suddenly locked up" and fell down. cpr was initiated immediately. When ems arrives pt was agonal with respirations but strong carotid pulse. King airway was placed along with IO in L shin and he was transported to [facility]   Pt is unresponsive and sedated on vent. All history is obtained from chart and ed physician. Pmh is significant for htn, dm2, h/o cva and depression. There is a report of a white powder substance near the pt where he was found down.   Pt achieved ROSC without shock or acls medications per ems.   Upon arrival to ed he was unresponsive with gcs 3. When he was transported to ct he began to move spontaneously req some sedation.   Upon my exam in the emergency department pt is awake following commands, nodding and shaking head to questions appropriately. He is not on sedation, hemodynamically stable but on escalated vent settings.   He denies pain  Past Medical History   Past Medical History:  Diagnosis Date  . Depression   . Diabetes mellitus without complication (HCC)   . Hypertension   . Stroke [facility])     Significant Hospital Events   1/7 admitted post witnessed arrest  Consults:  Ccm 1/7  Procedures:    Significant Diagnostic Tests:  1/7 cth > Remote left basal ganglia, pontine, and bilateral cerebellar infarcts. No evidence of acute intracranial hemorrhage or infarct. Moderate right maxillary paranasal sinus disease.  Micro Data:  3COVID 03/02/2020 > Negative  Antimicrobials:  None  Interim history/subjective:  Able to follow commands on vent  Appears alert and oriented on vent  No acute  issues overnight   Objective   Blood pressure 107/76, pulse 87, temperature 98 F (36.7 C), temperature source Core, resp. rate 18, height 5\' 8"  (1.727 m), weight 102.1 kg, SpO2 100 %.    Vent Mode: PRVC FiO2 (%):  [60 %-100 %] 60 % Set Rate:  [18 bmp] 18 bmp Vt Set:  [550 mL] 550 mL PEEP:  [5 cmH20] 5 cmH20 Plateau Pressure:  [15 cmH20-16 cmH20] 16 cmH20  No intake or output data in the 24 hours ending 03/02/20 0730 Filed Weights   03/02/20 0306  Weight: 102.1 kg    Examination: General: Middle aged male lying in bed on mechanical ventilation, in NAD HEENT: ETT, MM pink/moist, PERRL,  Neuro: Alert and interactive on vent, non-focal  CV: s1s2 regular rate and rhythm, no murmur, rubs, or gallops,  PULM:  Clear to ascultation bilaterally, no added breath sounds. Tolerating vent well  GI: soft, bowel sounds active in all 4 quadrants, non-tender, non-distended Extremities: warm/dry, no edema  Skin: no rashes or lesions  Resolved Hospital Problem list     Assessment & Plan:  Out of hospital cardiac arrest  -Pt achieved ROSC without shock or acls medications per ems.  -unknown etiology, ekg without acute changes P: Patient awakened with purposeful movement in ED on arrival so did not receive TTM Continuous telemetry  ECHO pending  UDS positive for amphetamines, Benzos, and THC Possible cards consult   Respiratory insufficiency / Acute  hypoxic respiratory failure  - Iin the setting of cardiac arrest  P: Continue ventilator support with lung protective strategies  Trial SBT if able to tolerate can likely extubate today Wean PEEP and FiO2 for sats greater than 90%. Head of bed elevated 30 degrees. Plateau pressures less than 30 cm H20.  Follow intermittent chest x-ray and ABG.   Ensure adequate pulmonary hygiene  Follow cultures  VAP bundle in place  PAD protocol  Acute encephalopathy, improving  -s/p cardiac arrest  P: CTH negative  Delirium precaution Minimize  sedation     DM2 with hyperglycemia:  -Home medications not reconciled but appears patient takes Glipizide and metformin  P: SSI  CBG checks q4hrs Hemoglobin A1C 11.3 Will provide education once appropriate  H/o htn H/o HLD  -Home meds rec pending  P: Hold home meds until verified  Continuous telemetry  PRN IV hydralazine as needed   H/o cva:  P: Continue ASA Hold home Xarelto, can likely resume once abel to take orals Continue full dose lovenox for now   Hyponatremia/hypochloremia:  P: Supplement as needed  Trend Bmet   Acute Kidney Injury  -in the setting of Above. Baseline creatinine 0.8-1.2, creatinine on admission 1.55 P: Follow renal function / urine output Trend Bmet Avoid nephrotoxins Ensure adequate renal perfusion   Lactic acidosis, improving  P: Trend lactic  Monitor urine output   Transaminitis:  -indicative of chronic etoh use but level was negative P: Trend LFT's  Best practice:  Diet: npo Pain/Anxiety/Delirium protocol (if indicated): per protocol VAP protocol (if indicated): per protocol DVT prophylaxis: therapeutic lovenox, resume home xarelto once able to take orals GI prophylaxis: famotidine Glucose control: ssi Mobility: bedrest Code Status: full Family Communication: unable to reach Disposition: ICU  Labs   CBC: Recent Labs  Lab 03/02/20 0058 03/02/20 0433  WBC 9.6  --   NEUTROABS 7.1  --   HGB 15.7 14.6  HCT 45.4 43.0  MCV 88.0  --   PLT 244  --     Basic Metabolic Panel: Recent Labs  Lab 03/02/20 0058 03/02/20 0358 03/02/20 0433  NA 131* 131* 130*  K 3.9 4.7 4.4  CL 96* 97*  --   CO2 22 20*  --   GLUCOSE 476* 383*  --   BUN 15 14  --   CREATININE 1.55* 1.31*  --   CALCIUM 8.7* 9.0  --   MG  --  2.0  --   PHOS  --  4.8*  --    GFR: Estimated Creatinine Clearance: 67.7 mL/min (A) (by C-G formula based on SCr of 1.31 mg/dL (H)). Recent Labs  Lab 03/02/20 0058 03/02/20 0433  WBC 9.6  --   LATICACIDVEN  3.2* 2.7*    Liver Function Tests: Recent Labs  Lab 03/02/20 0058  AST 138*  ALT 99*  ALKPHOS 80  BILITOT 0.5  PROT 6.4*  ALBUMIN 3.2*   No results for input(s): LIPASE, AMYLASE in the last 168 hours. No results for input(s): AMMONIA in the last 168 hours.  ABG    Component Value Date/Time   PHART 7.381 03/02/2020 0433   PCO2ART 48.8 (H) 03/02/2020 0433   PO2ART 455 (H) 03/02/2020 0433   HCO3 29.2 (H) 03/02/2020 0433   TCO2 31 03/02/2020 0433   O2SAT 100.0 03/02/2020 0433     Coagulation Profile: No results for input(s): INR, PROTIME in the last 168 hours.  Cardiac Enzymes: No results for input(s): CKTOTAL, CKMB, CKMBINDEX, TROPONINI in the  last 168 hours.  HbA1C: Hgb A1c MFr Bld  Date/Time Value Ref Range Status  03/02/2020 03:58 AM 11.3 (H) 4.8 - 5.6 % Final    Comment:    (NOTE) Pre diabetes:          5.7%-6.4%  Diabetes:              >6.4%  Glycemic control for   <7.0% adults with diabetes   05/25/2015 06:30 AM 9.4 (H) 4.8 - 5.6 % Final    Comment:    (NOTE)         Pre-diabetes: 5.7 - 6.4         Diabetes: >6.4         Glycemic control for adults with diabetes: <7.0     CBG: Recent Labs  Lab 03/02/20 0101 03/02/20 0425  GLUCAP 446* 320*    Critical care time:    Performed by: Delfin Gant  Total critical care time: 30 minutes  Critical care time was exclusive of separately billable procedures and treating other patients.  Critical care was necessary to treat or prevent imminent or life-threatening deterioration.  Critical care was time spent personally by me on the following activities: development of treatment plan with patient and/or surrogate as well as nursing, discussions with consultants, evaluation of patient's response to treatment, examination of patient, obtaining history from patient or surrogate, ordering and performing treatments and interventions, ordering and review of laboratory studies, ordering and review of  radiographic studies, pulse oximetry and re-evaluation of patient's condition.  Delfin Gant, NP-C [facility] Contact / Pager information can be found on Amion  03/02/2020, 8:47 AM

## 2020-03-03 ENCOUNTER — Inpatient Hospital Stay (HOSPITAL_COMMUNITY): Payer: Medicare Other

## 2020-03-03 DIAGNOSIS — E119 Type 2 diabetes mellitus without complications: Secondary | ICD-10-CM | POA: Diagnosis not present

## 2020-03-03 DIAGNOSIS — I469 Cardiac arrest, cause unspecified: Secondary | ICD-10-CM | POA: Diagnosis not present

## 2020-03-03 DIAGNOSIS — J9601 Acute respiratory failure with hypoxia: Secondary | ICD-10-CM | POA: Diagnosis not present

## 2020-03-03 DIAGNOSIS — I10 Essential (primary) hypertension: Secondary | ICD-10-CM | POA: Diagnosis not present

## 2020-03-03 LAB — CBC
HCT: 39.8 % (ref 39.0–52.0)
Hemoglobin: 13 g/dL (ref 13.0–17.0)
MCH: 29.4 pg (ref 26.0–34.0)
MCHC: 32.7 g/dL (ref 30.0–36.0)
MCV: 90 fL (ref 80.0–100.0)
Platelets: 167 10*3/uL (ref 150–400)
RBC: 4.42 MIL/uL (ref 4.22–5.81)
RDW: 13.2 % (ref 11.5–15.5)
WBC: 11.9 10*3/uL — ABNORMAL HIGH (ref 4.0–10.5)
nRBC: 0 % (ref 0.0–0.2)

## 2020-03-03 LAB — GLUCOSE, CAPILLARY
Glucose-Capillary: 125 mg/dL — ABNORMAL HIGH (ref 70–99)
Glucose-Capillary: 129 mg/dL — ABNORMAL HIGH (ref 70–99)
Glucose-Capillary: 135 mg/dL — ABNORMAL HIGH (ref 70–99)
Glucose-Capillary: 138 mg/dL — ABNORMAL HIGH (ref 70–99)
Glucose-Capillary: 138 mg/dL — ABNORMAL HIGH (ref 70–99)
Glucose-Capillary: 161 mg/dL — ABNORMAL HIGH (ref 70–99)

## 2020-03-03 LAB — BASIC METABOLIC PANEL
Anion gap: 11 (ref 5–15)
BUN: 18 mg/dL (ref 8–23)
CO2: 25 mmol/L (ref 22–32)
Calcium: 8.2 mg/dL — ABNORMAL LOW (ref 8.9–10.3)
Chloride: 96 mmol/L — ABNORMAL LOW (ref 98–111)
Creatinine, Ser: 1.33 mg/dL — ABNORMAL HIGH (ref 0.61–1.24)
GFR, Estimated: >60 mL/min (ref >60)
Glucose, Bld: 180 mg/dL — ABNORMAL HIGH (ref 70–99)
Potassium: 3.9 mmol/L (ref 3.5–5.1)
Sodium: 132 mmol/L — ABNORMAL LOW (ref 135–145)

## 2020-03-03 MED ORDER — POLYETHYLENE GLYCOL 3350 17 G PO PACK
17.0000 g | PACK | Freq: Every day | ORAL | Status: DC
  Administered 2020-03-04 – 2020-03-09 (×4): 17 g via ORAL
  Filled 2020-03-03 (×6): qty 1

## 2020-03-03 MED ORDER — SALINE SPRAY 0.65 % NA SOLN
1.0000 | NASAL | Status: DC | PRN
  Administered 2020-03-04: 22:00:00 1 via NASAL
  Filled 2020-03-03: qty 44

## 2020-03-03 MED ORDER — HYDROMORPHONE HCL 1 MG/ML IJ SOLN: 0.5000 mg | Freq: Once | INTRAMUSCULAR | Status: DC

## 2020-03-03 MED ORDER — MORPHINE SULFATE (PF) 2 MG/ML IV SOLN
2.0000 mg | Freq: Once | INTRAVENOUS | Status: AC
  Administered 2020-03-03: 2 mg via INTRAVENOUS
  Filled 2020-03-03: qty 1

## 2020-03-03 MED ORDER — IPRATROPIUM-ALBUTEROL 0.5-2.5 (3) MG/3ML IN SOLN
3.0000 mL | RESPIRATORY_TRACT | Status: DC | PRN
  Administered 2020-03-03: 3 mL via RESPIRATORY_TRACT
  Filled 2020-03-03: qty 3

## 2020-03-03 MED ORDER — TAMSULOSIN HCL 0.4 MG PO CAPS
0.4000 mg | ORAL_CAPSULE | Freq: Every day | ORAL | Status: DC
  Administered 2020-03-03 – 2020-03-08 (×7): 0.4 mg via ORAL
  Filled 2020-03-03 (×7): qty 1

## 2020-03-03 MED ORDER — LIDOCAINE HCL URETHRAL/MUCOSAL 2 % EX GEL
1.0000 "application " | Freq: Once | CUTANEOUS | Status: DC
  Filled 2020-03-03: qty 11

## 2020-03-03 MED ORDER — DOCUSATE SODIUM 100 MG PO CAPS
100.0000 mg | ORAL_CAPSULE | Freq: Two times a day (BID) | ORAL | Status: DC
  Administered 2020-03-03 – 2020-03-09 (×12): 100 mg via ORAL
  Filled 2020-03-03 (×12): qty 1

## 2020-03-03 MED ORDER — ASPIRIN 81 MG PO CHEW
81.0000 mg | CHEWABLE_TABLET | Freq: Every day | ORAL | Status: DC
Start: 2020-03-04 — End: 2020-03-09
  Administered 2020-03-04 – 2020-03-09 (×5): 81 mg via ORAL
  Filled 2020-03-03 (×5): qty 1

## 2020-03-03 MED ORDER — FAMOTIDINE 20 MG PO TABS
20.0000 mg | ORAL_TABLET | Freq: Two times a day (BID) | ORAL | Status: DC
  Administered 2020-03-03 – 2020-03-09 (×12): 20 mg via ORAL
  Filled 2020-03-03 (×12): qty 1

## 2020-03-03 NOTE — Progress Notes (Signed)
PT Cancellation Note  Patient Details Name: [name] MRN: [id number] DOB: 1955-12-28   Cancelled Treatment:    Reason Eval/Treat Not Completed: Pain limiting ability to participate pt states his chest and leg are too painful to move, education on chest remaining sore due to CPR, educated on importance of moving to decrease pain, pt refused  Ginette Otto, DPT Acute Rehabilitation Services [phone]   Lucretia Field 03/03/2020, 9:19 AM

## 2020-03-03 NOTE — Progress Notes (Signed)
Patient with no urine output noted since Foley cath removal at 1630 03/02/20 . Patient stated no urge to urinate, denies bladder discomforts. Bladder scan done with 217 ml noted. Nichole G from critical team notified. oredered  to repeat bladder scan at 6 am today.  Continue to monitor patient .

## 2020-03-03 NOTE — Evaluation (Addendum)
Occupational Therapy Evaluation Patient Details Name: [name] MRN: [id number] DOB: March 25, 1955 Today's Date: 03/03/2020    History of Present Illness 65 yo with past medical history significant for essential hypertension, type 2 diabetes, prior CVA and chronic depression who was brought in by ems after witnessed arrest.   Clinical Impression   Patient admitted for the above diagnosis.  Deficits impacting independence are listed below.  PTA, he lived alone, was independent with ADLs, toileting and functional mobility.  He does not drive, and relies on neighbors to help him.  Currently due to pain, he is deferring out of bed, needs up to Mod A for bed mobility, and up to max A for lower body ADL.  Patient has been unable to urinate, OT attempted edge of bed sitting to help, but he was unable to urinate.  His plan to is to return home.  OT is indicated in the acute setting to maximize self care and in room mobility prior to returning home.      Follow Up Recommendations  No OT follow up    Equipment Recommendations       Recommendations for Other Services       Precautions / Restrictions Precautions Precautions: Fall Restrictions Weight Bearing Restrictions: No Other Position/Activity Restrictions: sternal chect pain, external defibrillator in place.      Mobility Bed Mobility Overal bed mobility: Needs Assistance Bed Mobility: Sidelying to Sit;Sit to Sidelying   Sidelying to sit: Mod assist     Sit to sidelying: Mod assist      Transfers                 General transfer comment: deferred attempts to stand or get to the recliner.    Balance Overall balance assessment: Needs assistance Sitting-balance support: Single extremity supported Sitting balance-Leahy Scale: Fair                                     ADL either performed or assessed with clinical judgement   ADL Overall ADL's : Needs assistance/impaired     Grooming: Wash/dry  hands;Wash/dry face;Set up;Sitting           Upper Body Dressing : Minimal assistance;Sitting   Lower Body Dressing: Maximal assistance;Sitting/lateral leans                       Vision Baseline Vision/History: Wears glasses Patient Visual Report: No change from baseline       Perception     Praxis      Pertinent Vitals/Pain Pain Assessment: Faces Faces Pain Scale: Hurts whole lot Pain Location: L shin and sternal area from compressions. Pain Descriptors / Indicators: Stabbing;Tender;Guarding;Grimacing Pain Intervention(s): Limited activity within patient's tolerance     Hand Dominance Right   Extremity/Trunk Assessment Upper Extremity Assessment Upper Extremity Assessment: Overall WFL for tasks assessed   Lower Extremity Assessment Lower Extremity Assessment: Defer to PT evaluation   Cervical / Trunk Assessment Cervical / Trunk Assessment: Kyphotic   Communication Communication Communication: No difficulties   Cognition Arousal/Alertness: Awake/alert Behavior During Therapy: Flat affect Overall Cognitive Status: Within Functional Limits for tasks assessed                                     General Comments   HR steady in the upper 80's  throughout.      Exercises     Shoulder Instructions      Home Living Family/patient expects to be discharged to:: Private residence Living Arrangements: Alone   Type of Home: Apartment Home Access: Level entry     Home Layout: One level     Bathroom Shower/Tub: Producer, television/film/video: Handicapped height Bathroom Accessibility: Yes How Accessible: Accessible via walker Home Equipment: Walker - 2 wheels;Shower seat;Cane - single point   Additional Comments: states he has a steep incliner to get to his apartment.      Prior Functioning/Environment Level of Independence: Independent with assistive device(s)                 OT Problem List: Decreased activity  tolerance;Impaired balance (sitting and/or standing);Pain      OT Treatment/Interventions: Self-care/ADL training;DME and/or AE instruction;Balance training;Therapeutic activities    OT Goals(Current goals can be found in the care plan section) Acute Rehab OT Goals Patient Stated Goal: Not hurt so I can go home OT Goal Formulation: With patient Time For Goal Achievement: 03/17/20 Potential to Achieve Goals: Good ADL Goals Pt Will Perform Grooming: with supervision;standing Pt Will Perform Lower Body Bathing: with supervision;sit to/from stand Pt Will Perform Lower Body Dressing: with supervision;sit to/from stand Pt Will Transfer to Toilet: with supervision;ambulating Pt Will Perform Toileting - Clothing Manipulation and hygiene: with supervision  OT Frequency: Min 2X/week   Barriers to D/C:    none       Co-evaluation              AM-PAC OT "6 Clicks" Daily Activity     Outcome Measure Help from another person eating meals?: A Little   Help from another person toileting, which includes using toliet, bedpan, or urinal?: A Lot Help from another person bathing (including washing, rinsing, drying)?: A Lot Help from another person to put on and taking off regular upper body clothing?: A Little Help from another person to put on and taking off regular lower body clothing?: A Lot 6 Click Score: 12   End of Session Equipment Utilized During Treatment: Oxygen Nurse Communication: Mobility status  Activity Tolerance: Patient limited by pain Patient left: in bed;with call bell/phone within reach;with bed alarm set  OT Visit Diagnosis: Pain                Time: 1420-1440 OT Time Calculation (min): 20 min Charges:  OT General Charges $OT Visit: 1 Visit OT Evaluation $OT Eval Moderate Complexity: 1 Mod  03/03/2020  Rich, OTR/L  Acute Rehabilitation Services  Office:  [phone]   Suzanna Obey 03/03/2020, 2:50 PM

## 2020-03-03 NOTE — Progress Notes (Signed)
PROGRESS NOTE  [name] YPP:[id number] DOB: 1955-10-13 DOA: 03/02/2020 PCP: Dema Severin, NP  HPI/Recap of past 24 hours: 65 yo with past medical history significant for essential hypertension, type 2 diabetes, prior CVA and chronic depression who was brought in by ems after witnessed arrest. Pt was with family when he "suddenly locked up" and fell down. cpr was initiated immediately. When ems arrives pt was agonal with respirations but strong carotid pulse. King airway was placed along with IO in L shin and he was transported to [facility].  There was a report of a white powder substance near the patient where he was found down.  Patient achieved ROSC without shock or ACLS medications per EMS.  He was unresponsive and sedated on vent.  Admitted to the ICU.  Extubated to nasal cannula on 03/02/2020.  Transferred to [facility] service on 03/03/2020.  03/03/20: Patient was seen and examined at his bedside.  He reports chest soreness at the site of chest compression.  Also reports left lower extremity soreness.  He does not recall the event that led to this admission.   Assessment/Plan: Active Problems:   Cardiac arrest (HCC)    Acute hypoxic respiratory failure suspect secondary to atelectasis Initially intubated due to cardiac arrest and unresponsiveness Extubated on 03/02/2020 He is currently requiring 4 L to maintain O2 saturation greater than 90% Wean off oxygen supplementation as tolerated Personally reviewed chest x-ray done on 03/02/2020 showing atelectasis at the bases bilaterally.  No evidence of pulmonary edema or lobular infiltrates. Start incentive spirometer Mobilize as tolerated   Elevated troponin, rule out ACS Presented after cardiac arrest Troponin has peaked at 463 and trending down. 2D echo with reduced LVEF Seen by cardiology, plan for left heart cath Is currently on aspirin, Coreg 3.125 mg twice daily, Crestor 20 mg daily Reports chest discomfort this morning at the site of  his chest compressions.   Cardiac arrest, unknown etiology  was seen by cardiology 2D echo done on 03/02/2020 revealing LVEF 45 to 50% with left ventricle global hypokinesis, grade 1 diastolic dysfunction  AKI Baseline creatinine appears to be 0.8 with GFR 26 Presented with creatinine of 1.55 with GFR of 50 He received IV fluid hydration IV fluid discontinued on 03/03/2020 Monitor urine output Avoid nephrotoxins, hypotension Renal panel in the morning  Hypervolemic hyponatremia Serum sodium uptrending 132 from 130 Continue to monitor Repeat renal panel in the morning.  Acute on chronic combined diastolic and systolic CHF Obtain BNP Last 2D echo with findings as stated above, LVEF reduced from prior Continue Coreg, medications as recommended by cardiology Closely monitor volume status DC IV fluid Strict I's and O's and daily weights  Type 2 diabetes with hyperglycemia Hemoglobin A1c 11.3 on 03/02/2020 Continue insulin sliding scale code   Acute toxic metabolic encephalopathy  - resolved  --UDS +polysubstance  --CT head neg     Code Status: Full code  Family Communication: None at bedside.  Disposition Plan: Likely will DC to home with home health services   Consultants:  Cardiology  PCCM  Procedures:  Intubation on admission  Extubation on 03/02/2020  Antimicrobials:  None.  DVT prophylaxis: Subcu Lovenox daily  Status is: Inpatient    Dispo:  Patient From: Home  Planned Disposition: Home  Expected discharge date: 03/05/2020  Medically stable for discharge: No, ongoing management of recent cardiac arrest.         Objective: Vitals:   03/03/20 0842 03/03/20 1100 03/03/20 1200 03/03/20 1300  BP:  122/85 122/80 115/71 110/76  Pulse: (!) 104 96 94 92  Resp: 15 19 16 13   Temp: 99.2 F (37.3 C)   98.8 F (37.1 C)  TempSrc: Oral   Oral  SpO2: 98% 96% 99% 98%  Weight:      Height:        Intake/Output Summary (Last 24 hours) at 03/03/2020  1417 Last data filed at 03/02/2020 1800 Gross per 24 hour  Intake 1399.47 ml  Output -  Net 1399.47 ml   Filed Weights   03/02/20 0306  Weight: 102.1 kg    Exam:  . General: 65 y.o. year-old male well developed well nourished in no acute distress.  Alert and oriented x3. . Cardiovascular: Regular rate and rhythm with no rubs or gallops.  No thyromegaly or JVD noted.   Marland Kitchen. Respiratory: Mild rales at bases bilaterally.  No wheezing noted.  Poor inspiratory effort. . Abdomen: Soft nontender nondistended with normal bowel sounds x4 quadrants. . Musculoskeletal: No lower extremity edema. 2/4 pulses in all 4 extremities. . Skin: No ulcerative lesions noted or rashes, . Psychiatry: Mood is appropriate for condition and setting   Data Reviewed: CBC: Recent Labs  Lab 03/02/20 0058 03/02/20 0433 03/03/20 0234  WBC 9.6  --  11.9*  NEUTROABS 7.1  --   --   HGB 15.7 14.6 13.0  HCT 45.4 43.0 39.8  MCV 88.0  --  90.0  PLT 244  --  167   Basic Metabolic Panel: Recent Labs  Lab 03/02/20 0058 03/02/20 0358 03/02/20 0433 03/03/20 0234  NA 131* 131* 130* 132*  K 3.9 4.7 4.4 3.9  CL 96* 97*  --  96*  CO2 22 20*  --  25  GLUCOSE 476* 383*  --  180*  BUN 15 14  --  18  CREATININE 1.55* 1.31*  --  1.33*  CALCIUM 8.7* 9.0  --  8.2*  MG  --  2.0  --   --   PHOS  --  4.8*  --   --    GFR: Estimated Creatinine Clearance: 66.7 mL/min (A) (by C-G formula based on SCr of 1.33 mg/dL (H)). Liver Function Tests: Recent Labs  Lab 03/02/20 0058  AST 138*  ALT 99*  ALKPHOS 80  BILITOT 0.5  PROT 6.4*  ALBUMIN 3.2*   No results for input(s): LIPASE, AMYLASE in the last 168 hours. No results for input(s): AMMONIA in the last 168 hours. Coagulation Profile: No results for input(s): INR, PROTIME in the last 168 hours. Cardiac Enzymes: No results for input(s): CKTOTAL, CKMB, CKMBINDEX, TROPONINI in the last 168 hours. BNP (last 3 results) No results for input(s): PROBNP in the last 8760  hours. HbA1C: Recent Labs    03/02/20 0358  HGBA1C 11.3*   CBG: Recent Labs  Lab 03/02/20 2127 03/03/20 0033 03/03/20 0438 03/03/20 0736 03/03/20 1100  GLUCAP 189* 129* 161* 125* 138*   Lipid Profile: No results for input(s): CHOL, HDL, LDLCALC, TRIG, CHOLHDL, LDLDIRECT in the last 72 hours. Thyroid Function Tests: No results for input(s): TSH, T4TOTAL, FREET4, T3FREE, THYROIDAB in the last 72 hours. Anemia Panel: No results for input(s): VITAMINB12, FOLATE, FERRITIN, TIBC, IRON, RETICCTPCT in the last 72 hours. Urine analysis:    Component Value Date/Time   COLORURINE YELLOW 03/02/2020 0355   APPEARANCEUR CLEAR 03/02/2020 0355   LABSPEC 1.032 (H) 03/02/2020 0355   PHURINE 6.0 03/02/2020 0355   GLUCOSEU >=500 (A) 03/02/2020 0355   HGBUR NEGATIVE 03/02/2020 0355  BILIRUBINUR NEGATIVE 03/02/2020 0355   KETONESUR NEGATIVE 03/02/2020 0355   PROTEINUR NEGATIVE 03/02/2020 0355   UROBILINOGEN 0.2 10/16/2013 0837   NITRITE NEGATIVE 03/02/2020 0355   LEUKOCYTESUR NEGATIVE 03/02/2020 0355   Sepsis Labs: @LABRCNTIP (procalcitonin:4,lacticidven:4)  ) Recent Results (from the past 240 hour(s))  Resp Panel by RT-PCR (Flu A&B, Covid) Nasopharyngeal Swab     Status: None   Collection Time: 03/02/20 12:59 AM   Specimen: Nasopharyngeal Swab; Nasopharyngeal(NP) swabs in vial transport medium  Result Value Ref Range Status   SARS Coronavirus 2 by RT PCR NEGATIVE NEGATIVE Final    Comment: (NOTE) SARS-CoV-2 target nucleic acids are NOT DETECTED.  The SARS-CoV-2 RNA is generally detectable in upper respiratory specimens during the acute phase of infection. The lowest concentration of SARS-CoV-2 viral copies this assay can detect is 138 copies/mL. A negative result does not preclude SARS-Cov-2 infection and should not be used as the sole basis for treatment or other patient management decisions. A negative result may occur with  improper specimen collection/handling, submission of  specimen other than nasopharyngeal swab, presence of viral mutation(s) within the areas targeted by this assay, and inadequate number of viral copies(<138 copies/mL). A negative result must be combined with clinical observations, patient history, and epidemiological information. The expected result is Negative.  Fact Sheet for Patients:  04/30/20  Fact Sheet for Healthcare Providers:  BloggerCourse.com  This test is no t yet approved or cleared by the [employer]ious[employer]ker.[place] FDA and  has been authorized for detection and/or diagnosis of SARS-CoV-2 by FDA under an Emergency Use Authorization (EUA). This EUA will remain  in effect (meaning this test can be used) for the duration of the COVID-19 declaration under Section 564(b)(1) of the Act, 21 U.S.C.section 360bbb-3(b)(1), unless the authorization is terminated  or revoked sooner.       Influenza A by PCR NEGATIVE NEGATIVE Final   Influenza B by PCR NEGATIVE NEGATIVE Final    Comment: (NOTE) The Xpert Xpress SARS-CoV-2/FLU/RSV plus assay is intended as an aid in the diagnosis of influenza from Nasopharyngeal swab specimens and should not be used as a sole basis for treatment. Nasal washings and aspirates are unacceptable for Xpert Xpress SARS-CoV-2/FLU/RSV testing.  Fact Sheet for Patients: [place]  Fact Sheet for Healthcare Providers: BloggerCourse.com  This test is not yet approved or cleared by the SeriousBroker.it FDA and has been authorized for detection and/or diagnosis of SARS-CoV-2 by FDA under an Emergency Use Authorization (EUA). This EUA will remain in effect (meaning this test can be used) for the duration of the COVID-19 declaration under Section 564(b)(1) of the Act, 21 U.S.C. section 360bbb-3(b)(1), unless the authorization is terminated or revoked.  Performed at [facility], [address],  [place], [place]       Studies: No results found.  Scheduled Meds: . aspirin  81 mg Per Tube Daily  . carvedilol  3.125 mg Oral BID WC  . COVID-19 mRNA vaccine (Moderna)  0.5 mL Intramuscular ONCE-1600  . docusate  100 mg Per Tube BID  . enoxaparin (LOVENOX) injection  100 mg Subcutaneous Q12H  . famotidine  20 mg Per Tube BID  . insulin aspart  0-15 Units Subcutaneous Q4H  . polyethylene glycol  17 g Per Tube Daily  . rosuvastatin  20 mg Oral QHS    Continuous Infusions: . sodium chloride 75 mL/hr at 03/03/20 0519     LOS: 1 day     05/01/20, MD [facility] Pager  [phone]  If 7PM-7AM, please contact night-coverage [email or web address] Password [facility] 03/03/2020, 2:17 PM

## 2020-03-03 NOTE — Progress Notes (Signed)
Progress Note  Patient Name: Ian JubaHugh Donald [name] Date of Encounter: 03/03/2020  [facility] Cardiologist:    Subjective   No SOB  No CP    Inpatient Medications    Scheduled Meds: . aspirin  81 mg Per Tube Daily  . carvedilol  3.125 mg Oral BID WC  . COVID-19 mRNA vaccine (Moderna)  0.5 mL Intramuscular ONCE-1600  . docusate  100 mg Per Tube BID  . enoxaparin (LOVENOX) injection  100 mg Subcutaneous Q12H  . famotidine  20 mg Per Tube BID  . insulin aspart  0-15 Units Subcutaneous Q4H  . polyethylene glycol  17 g Per Tube Daily  . rosuvastatin  20 mg Oral QHS   Continuous Infusions: . sodium chloride 75 mL/hr at 03/03/20 0519   PRN Meds: acetaminophen, docusate sodium, ondansetron (ZOFRAN) IV, polyethylene glycol   Vital Signs    Vitals:   03/02/20 1829 03/02/20 2046 03/03/20 0449 03/03/20 0842  BP: 121/82 105/71 122/77 122/85  Pulse: (!) 109 100 100 (!) 104  Resp: 18 18 15 15   Temp: 99.2 F (37.3 C) 99 F (37.2 C) 98.7 F (37.1 C) 99.2 F (37.3 C)  TempSrc: Oral  Oral Oral  SpO2: 95% 96% 97% 98%  Weight:      Height:        Intake/Output Summary (Last 24 hours) at 03/03/2020 1212 Last data filed at 03/02/2020 1800 Gross per 24 hour  Intake 1399.47 ml  Output -  Net 1399.47 ml   Last 3 Weights 03/02/2020 05/02/2019 04/23/2019  Weight (lbs) 225 lb 225 lb 220 lb  Weight (kg) 102.059 kg 102.059 kg 99.791 kg  Some encounter information is confidential and restricted. Go to Review Flowsheets activity to see all data.      Telemetry    SR/ST   - Personally Reviewed  ECG    No new  - Personally Reviewed  Physical Exam   GEN: No acute distress.  Appears dissheveled   Neck: No JVD Cardiac: RRR, no murmurs, rubs, or gallops.  Respiratory: Clear to auscultation bilaterally. GI: Soft, nontender, non-distended  MS: No edema; No deformity. Neuro:  Nonfocal  Psych: Normal affect   Labs    High Sensitivity Troponin:   Recent Labs  Lab 03/02/20 0058  03/02/20 0358 03/02/20 1024 03/02/20 1442  TROPONINIHS 23* 102* 463* 311*      Chemistry Recent Labs  Lab 03/02/20 0058 03/02/20 0358 03/02/20 0433 03/03/20 0234  NA 131* 131* 130* 132*  K 3.9 4.7 4.4 3.9  CL 96* 97*  --  96*  CO2 22 20*  --  25  GLUCOSE 476* 383*  --  180*  BUN 15 14  --  18  CREATININE 1.55* 1.31*  --  1.33*  CALCIUM 8.7* 9.0  --  8.2*  PROT 6.4*  --   --   --   ALBUMIN 3.2*  --   --   --   AST 138*  --   --   --   ALT 99*  --   --   --   ALKPHOS 80  --   --   --   BILITOT 0.5  --   --   --   GFRNONAA 50* >60  --  >60  ANIONGAP 13 14  --  11     Hematology Recent Labs  Lab 03/02/20 0058 03/02/20 0433 03/03/20 0234  WBC 9.6  --  11.9*  RBC 5.16  --  4.42  HGB  15.7 14.6 13.0  HCT 45.4 43.0 39.8  MCV 88.0  --  90.0  MCH 30.4  --  29.4  MCHC 34.6  --  32.7  RDW 13.2  --  13.2  PLT 244  --  167    BNPNo results for input(s): BNP, PROBNP in the last 168 hours.   DDimer No results for input(s): DDIMER in the last 168 hours.   Radiology    CT Head Wo Contrast  Result Date: 03/02/2020 CLINICAL DATA:  Syncope, altered mental status EXAM: CT HEAD WITHOUT CONTRAST TECHNIQUE: Contiguous axial images were obtained from the base of the skull through the vertex without intravenous contrast. COMPARISON:  MRI 10/17/2013 FINDINGS: Brain: Normal anatomic configuration. Parenchymal volume loss is commensurate with the patient's age. Mild periventricular white matter changes are present likely reflecting the sequela of small vessel ischemia. Remote bilateral cerebellar, left lentiform nucleus, and right pontine infarcts are identified. No abnormal intra or extra-axial mass lesion or fluid collection. No abnormal mass effect or midline shift. No evidence of acute intracranial hemorrhage or infarct. Ventricular size is normal. Cerebellum unremarkable. Vascular: Extensive atherosclerotic calcification is seen within the distal vertebral arteries and the basilar  artery appears diminutive. Moderate atherosclerotic calcification noted within the carotid siphons bilaterally. Skull: Intact Sinuses/Orbits: There is moderate mucosal thickening within the right maxillary sinus. Remaining paranasal sinuses are clear. The orbits are unremarkable. Other: Mastoid air cells and middle ear cavities are clear. IMPRESSION: Remote left basal ganglia, pontine, and bilateral cerebellar infarcts. No evidence of acute intracranial hemorrhage or infarct. Extensive intracranial atherosclerotic disease. Moderate right maxillary paranasal sinus disease. Electronically Signed   By: Helyn Numbers MD   On: 03/02/2020 02:10   DG Chest Port 1 View  Result Date: 03/02/2020 CLINICAL DATA:  Respiratory failure EXAM: PORTABLE CHEST 1 VIEW COMPARISON:  09/11/2017 FINDINGS: Endotracheal tube is seen at the level of the clavicular heads, 6.3 cm above the carina. Nasogastric tube extends into the upper abdomen beyond the margin of the examination. Lungs are clear. No pneumothorax or pleural effusion. Cardiac size within normal limits. Pulmonary vascularity is normal. IMPRESSION: Endotracheal tube 6.3 cm above the carina. Nasogastric tube extends into the upper abdomen. No radiographic evidence of acute cardiopulmonary disease. Electronically Signed   By: Helyn Numbers MD   On: 03/02/2020 01:27   ECHOCARDIOGRAM COMPLETE  Result Date: 03/02/2020    ECHOCARDIOGRAM REPORT   Patient Name:   [name] [facility] Date of Exam: 03/02/2020 Medical Rec #:  [id number]          Height:       68.0 in Accession #:    [id number]         Weight:       225.0 lb Date of Birth:  Jan 17, 1956         BSA:          2.149 m Patient Age:    65 years           BP:           139/78 mmHg Patient Gender: M                  HR:           96 bpm. Exam Location:  Inpatient Procedure: 2D Echo, Cardiac Doppler and Color Doppler Indications:    Cardiac arrest I46.9  History:        Patient has prior history of Echocardiogram examinations,  most  recent 10/16/2013. Stroke; Risk Factors:Hypertension, Diabetes                 and Current Smoker.  Sonographer:    Renella Cunas RDCS Referring Phys: [id number] JESSICA MARSHALL  Sonographer Comments: Echo performed with patient supine and on artificial respirator. IMPRESSIONS  1. Left ventricular ejection fraction, by estimation, is 45 to 50%. The left ventricle has mildly decreased function. The left ventricle demonstrates global hypokinesis. There is mild left ventricular hypertrophy. Left ventricular diastolic parameters are consistent with Grade I diastolic dysfunction (impaired relaxation).  2. Right ventricular systolic function is mildly reduced. The right ventricular size is normal. Tricuspid regurgitation signal is inadequate for assessing PA pressure.  3. The mitral valve is normal in structure. No evidence of mitral valve regurgitation. No evidence of mitral stenosis.  4. The aortic valve is tricuspid. Aortic valve regurgitation is not visualized. No aortic stenosis is present.  5. Aortic dilatation noted. There is mild dilatation of the aortic root, measuring 42 mm.  6. The inferior vena cava is normal in size with greater than 50% respiratory variability, suggesting right atrial pressure of 3 mmHg. FINDINGS  Left Ventricle: Left ventricular ejection fraction, by estimation, is 45 to 50%. The left ventricle has mildly decreased function. The left ventricle demonstrates global hypokinesis. The left ventricular internal cavity size was normal in size. There is  mild left ventricular hypertrophy. Left ventricular diastolic parameters are consistent with Grade I diastolic dysfunction (impaired relaxation). Right Ventricle: The right ventricular size is normal. No increase in right ventricular wall thickness. Right ventricular systolic function is mildly reduced. Tricuspid regurgitation signal is inadequate for assessing PA pressure. Left Atrium: Left atrial size was normal in size. Right  Atrium: Right atrial size was normal in size. Pericardium: There is no evidence of pericardial effusion. Mitral Valve: The mitral valve is normal in structure. No evidence of mitral valve regurgitation. No evidence of mitral valve stenosis. Tricuspid Valve: The tricuspid valve is normal in structure. Tricuspid valve regurgitation is not demonstrated. Aortic Valve: The aortic valve is tricuspid. Aortic valve regurgitation is not visualized. No aortic stenosis is present. Pulmonic Valve: The pulmonic valve was normal in structure. Pulmonic valve regurgitation is trivial. Aorta: Aortic dilatation noted. There is mild dilatation of the aortic root, measuring 42 mm. Venous: The inferior vena cava is normal in size with greater than 50% respiratory variability, suggesting right atrial pressure of 3 mmHg. IAS/Shunts: No atrial level shunt detected by color flow Doppler.  LEFT VENTRICLE PLAX 2D LVIDd:         4.30 cm     Diastology LVIDs:         3.70 cm     LV e' medial:    3.68 cm/s LV PW:         1.30 cm     LV E/e' medial:  13.0 LV IVS:        1.40 cm     LV e' lateral:   4.20 cm/s LVOT diam:     2.10 cm     LV E/e' lateral: 11.4 LV SV:         36 LV SV Index:   17 LVOT Area:     3.46 cm  LV Volumes (MOD) LV vol d, MOD A2C: 78.9 ml LV vol d, MOD A4C: 95.6 ml LV vol s, MOD A2C: 42.6 ml LV vol s, MOD A4C: 57.8 ml LV SV MOD A2C:     36.3 ml LV SV MOD A4C:  95.6 ml LV SV MOD BP:      36.6 ml RIGHT VENTRICLE RV S prime:     9.75 cm/s TAPSE (M-mode): 1.6 cm LEFT ATRIUM             Index       RIGHT ATRIUM           Index LA diam:        3.20 cm 1.49 cm/m  RA Area:     11.50 cm LA Vol (A2C):   32.0 ml 14.89 ml/m RA Volume:   24.50 ml  11.40 ml/m LA Vol (A4C):   18.0 ml 8.38 ml/m LA Biplane Vol: 24.9 ml 11.59 ml/m  AORTIC VALVE LVOT Vmax:   84.60 cm/s LVOT Vmean:  60.100 cm/s LVOT VTI:    0.103 m  AORTA Ao Root diam: 4.20 cm Ao Asc diam:  4.00 cm MITRAL VALVE MV Area (PHT): 3.93 cm    SHUNTS MV Decel Time: 193 msec     Systemic VTI:  0.10 m MV E velocity: 48.00 cm/s  Systemic Diam: 2.10 cm MV A velocity: 65.80 cm/s MV E/A ratio:  0.73 Marca Ancona MD Electronically signed by Marca Ancona MD Signature Date/Time: 03/02/2020/4:15:08 PM    Final     Cardiac Studies   Echo:   03/03/19 1. Left ventricular ejection fraction, by estimation, is 45 to 50%. The left ventricle has mildly decreased function. The left ventricle demonstrates global hypokinesis. There is mild left ventricular hypertrophy. Left ventricular diastolic parameters are consistent with Grade I diastolic dysfunction (impaired relaxation). 2. Right ventricular systolic function is mildly reduced. The right ventricular size is normal. Tricuspid regurgitation signal is inadequate for assessing PA pressure. 3. The mitral valve is normal in structure. No evidence of mitral valve regurgitation. No evidence of mitral stenosis. 4. The aortic valve is tricuspid. Aortic valve regurgitation is not visualized. No aortic stenosis is present. 5. Aortic dilatation noted. There is mild dilatation of the aortic root, measuring 42 mm. 6. The inferior vena cava is normal in size with greater than 50% respiratory variability, suggesting right atrial pressure of 3 mmHg.  Patient Profile      [name] is a 65 y.o. male with a hx of CAD s/p CABG of unknown date, CVA, DM2, HTN, depression and hx of illicit drug use who is being seen today for the evaluation of  at the request of Raymon Mutton NP-C.   Assessment & Plan    1  Cardiac vs resp arrest.  No records   Out of hospital   DId undergo CPR prior to EMS arrival   Pt I poor historian    Now he is extubated.   Poor historian  Remote CABG (90s), hx meth use  Cant remember having CP   Cant remember prodrome of event Echo as noted with mild LV dysfunciton   Given above I would recomm L heart cath to define anatomy Waiting on records from [facility] for CABG op note  Continue ASA, Crestor   On Corege   Dietrich Pates MD   For questions or updates, please contact [facility] Please consult [email or web address] for contact info under        Signed, Dietrich Pates, MD  03/03/2020, 12:12 PM

## 2020-03-03 NOTE — Progress Notes (Signed)
Pt c/o unable to urinate; however, also denies discomfort.  Repeated bladder scan at 1200 and was 530 mL. Pt adamantly refused to be in/out cathed.  States he will try in 2 hrs.  Will repeat bladder scan in 2 hrs.  Hinton Dyer, RN

## 2020-03-03 NOTE — Progress Notes (Signed)
Nutrition Brief Note  RD received consult for tube feed initiation/mangement.   Patient now extubated, diet advanced to HH/CM at 0902. Spoke with MD via secure chat, okay to d/c consult order.   No nutrition interventions warranted at this time. If nutrition issues arise, please consult RD.   Lars Masson, RD, LDN Clinical Nutrition After Hours/Weekend Pager # in Amion

## 2020-03-03 NOTE — Progress Notes (Signed)
Pt voided 625 mL after receiving flomax.  Post void bladder scan showed 82 mL.  Notified Dr. Margo Aye.  Received order to continue flomax, and no foley.  Hinton Dyer, RN

## 2020-03-03 NOTE — Progress Notes (Signed)
Repeat bladder scan showed 700 mL.  Pt still refusing in/out cath due to pain from foley cath. He states he will void in a little bit.  Dr. Margo Aye made aware.  Hinton Dyer, RN

## 2020-03-03 NOTE — Progress Notes (Signed)
Upon assessment by this RN, patient's right hand noted to be blue, cold, and swollen. Patient states it has been that way since staff placed it a couple of days ago. Coban wrap noted to be wrapped tightly around wrist to secure IV. Coban wrap cut and removed. Zierle-Goshe, MD notified. Warm packs applied to hand and pulse confirmed via doppler. Continue to monitor. Sensation and fine motor control grossly intact.

## 2020-03-03 NOTE — Progress Notes (Signed)
Repeat bladder scan done at 0630 with 378 ml noted. Joycelyn Das fr critical team notified. No new order. Requested to notify [facility] this am. RN Yoko made aware  to follow call with MD.

## 2020-03-04 DIAGNOSIS — E119 Type 2 diabetes mellitus without complications: Secondary | ICD-10-CM | POA: Diagnosis not present

## 2020-03-04 DIAGNOSIS — I10 Essential (primary) hypertension: Secondary | ICD-10-CM | POA: Diagnosis not present

## 2020-03-04 DIAGNOSIS — I469 Cardiac arrest, cause unspecified: Secondary | ICD-10-CM | POA: Diagnosis not present

## 2020-03-04 DIAGNOSIS — J9601 Acute respiratory failure with hypoxia: Secondary | ICD-10-CM | POA: Diagnosis not present

## 2020-03-04 LAB — CBC
HCT: 39.4 % (ref 39.0–52.0)
Hemoglobin: 13.6 g/dL (ref 13.0–17.0)
MCH: 29.8 pg (ref 26.0–34.0)
MCHC: 34.5 g/dL (ref 30.0–36.0)
MCV: 86.2 fL (ref 80.0–100.0)
Platelets: 172 10*3/uL (ref 150–400)
RBC: 4.57 MIL/uL (ref 4.22–5.81)
RDW: 12.8 % (ref 11.5–15.5)
WBC: 5.9 10*3/uL (ref 4.0–10.5)
nRBC: 0 % (ref 0.0–0.2)

## 2020-03-04 LAB — BASIC METABOLIC PANEL
Anion gap: 10 (ref 5–15)
BUN: 12 mg/dL (ref 8–23)
CO2: 25 mmol/L (ref 22–32)
Calcium: 8.7 mg/dL — ABNORMAL LOW (ref 8.9–10.3)
Chloride: 95 mmol/L — ABNORMAL LOW (ref 98–111)
Creatinine, Ser: 0.97 mg/dL (ref 0.61–1.24)
GFR, Estimated: >60 mL/min (ref >60)
Glucose, Bld: 164 mg/dL — ABNORMAL HIGH (ref 70–99)
Potassium: 3.8 mmol/L (ref 3.5–5.1)
Sodium: 130 mmol/L — ABNORMAL LOW (ref 135–145)

## 2020-03-04 LAB — GLUCOSE, CAPILLARY
Glucose-Capillary: 165 mg/dL — ABNORMAL HIGH (ref 70–99)
Glucose-Capillary: 145 mg/dL — ABNORMAL HIGH (ref 70–99)
Glucose-Capillary: 199 mg/dL — ABNORMAL HIGH (ref 70–99)
Glucose-Capillary: 152 mg/dL — ABNORMAL HIGH (ref 70–99)
Glucose-Capillary: 178 mg/dL — ABNORMAL HIGH (ref 70–99)

## 2020-03-04 LAB — PHOSPHORUS: Phosphorus: 3.3 mg/dL (ref 2.5–4.6)

## 2020-03-04 LAB — BRAIN NATRIURETIC PEPTIDE: B Natriuretic Peptide: 52.4 pg/mL (ref 0.0–100.0)

## 2020-03-04 LAB — MAGNESIUM: Magnesium: 1.7 mg/dL (ref 1.7–2.4)

## 2020-03-04 MED ORDER — SODIUM CHLORIDE 0.9% FLUSH
3.0000 mL | Freq: Two times a day (BID) | INTRAVENOUS | Status: DC
  Administered 2020-03-04: 3 mL via INTRAVENOUS

## 2020-03-04 MED ORDER — SODIUM CHLORIDE 0.9 % IV SOLN: 250.0000 mL | INTRAVENOUS | Status: DC | PRN

## 2020-03-04 MED ORDER — ASPIRIN 81 MG PO CHEW
81.0000 mg | CHEWABLE_TABLET | ORAL | Status: AC
  Administered 2020-03-05: 81 mg via ORAL
  Filled 2020-03-04: qty 1

## 2020-03-04 MED ORDER — SODIUM CHLORIDE 0.9% FLUSH: 3.0000 mL | INTRAVENOUS | Status: DC | PRN

## 2020-03-04 MED ORDER — LORAZEPAM 2 MG/ML IJ SOLN: 1.0000 mg | INTRAMUSCULAR | Status: AC | PRN

## 2020-03-04 MED ORDER — FOLIC ACID 1 MG PO TABS
1.0000 mg | ORAL_TABLET | Freq: Every day | ORAL | Status: DC
  Administered 2020-03-04 – 2020-03-09 (×6): 1 mg via ORAL
  Filled 2020-03-04 (×6): qty 1

## 2020-03-04 MED ORDER — LORAZEPAM 1 MG PO TABS
1.0000 mg | ORAL_TABLET | ORAL | Status: AC | PRN
  Administered 2020-03-04 – 2020-03-05 (×3): 1 mg via ORAL
  Filled 2020-03-04 (×3): qty 1

## 2020-03-04 MED ORDER — ADULT MULTIVITAMIN W/MINERALS CH
1.0000 | ORAL_TABLET | Freq: Every day | ORAL | Status: DC
  Administered 2020-03-04 – 2020-03-09 (×6): 1 via ORAL
  Filled 2020-03-04 (×6): qty 1

## 2020-03-04 MED ORDER — SODIUM CHLORIDE 0.9 % IV SOLN: INTRAVENOUS | Status: DC

## 2020-03-04 MED ORDER — NICOTINE 21 MG/24HR TD PT24
21.0000 mg | MEDICATED_PATCH | Freq: Every day | TRANSDERMAL | Status: DC
  Administered 2020-03-04 – 2020-03-09 (×6): 21 mg via TRANSDERMAL
  Filled 2020-03-04 (×5): qty 1

## 2020-03-04 MED ORDER — THIAMINE HCL 100 MG/ML IJ SOLN: 100.0000 mg | Freq: Every day | INTRAMUSCULAR | Status: DC

## 2020-03-04 MED ORDER — THIAMINE HCL 100 MG PO TABS
100.0000 mg | ORAL_TABLET | Freq: Every day | ORAL | Status: DC
  Administered 2020-03-04 – 2020-03-09 (×6): 100 mg via ORAL
  Filled 2020-03-04 (×6): qty 1

## 2020-03-04 MED ORDER — HYDROCODONE-ACETAMINOPHEN 7.5-325 MG PO TABS
1.0000 | ORAL_TABLET | Freq: Four times a day (QID) | ORAL | Status: DC | PRN
  Administered 2020-03-05 – 2020-03-09 (×3): 1 via ORAL
  Filled 2020-03-04 (×3): qty 1

## 2020-03-04 MED ORDER — MORPHINE SULFATE (PF) 2 MG/ML IV SOLN
2.0000 mg | INTRAVENOUS | Status: DC | PRN
  Administered 2020-03-04 – 2020-03-08 (×5): 2 mg via INTRAVENOUS
  Filled 2020-03-04 (×5): qty 1

## 2020-03-04 NOTE — H&P (View-Only) (Signed)
Progress Note  Patient Name: [name] Date of Encounter: 03/04/2020  [facility] Cardiologist:    Subjective   Chest is sore with deep breath  Breathing is OK at rest   Inpatient Medications    Scheduled Meds: . aspirin  81 mg Oral Daily  . carvedilol  3.125 mg Oral BID WC  . COVID-19 mRNA vaccine (Moderna)  0.5 mL Intramuscular ONCE-1600  . docusate sodium  100 mg Oral BID  . enoxaparin (LOVENOX) injection  100 mg Subcutaneous Q12H  . famotidine  20 mg Oral BID  .  HYDROmorphone (DILAUDID) injection  0.5 mg Intravenous Once  . insulin aspart  0-15 Units Subcutaneous Q4H  . lidocaine  1 application Urethral Once  . polyethylene glycol  17 g Oral Daily  . rosuvastatin  20 mg Oral QHS  . tamsulosin  0.4 mg Oral QPC supper   Continuous Infusions:  PRN Meds: acetaminophen, docusate sodium, ipratropium-albuterol, ondansetron (ZOFRAN) IV, polyethylene glycol, sodium chloride   Vital Signs    Vitals:   03/03/20 2332 03/03/20 2335 03/04/20 0515 03/04/20 1038  BP: 126/75  133/75 (!) 143/78  Pulse: (!) 101  (!) 104 (!) 103  Resp: 18  15   Temp: 98.8 F (37.1 C)  98.8 F (37.1 C)   TempSrc: Oral  Oral   SpO2:  98% 98%   Weight:   94.8 kg   Height:        Intake/Output Summary (Last 24 hours) at 03/04/2020 1109 Last data filed at 03/04/2020 0516 Gross per 24 hour  Intake -  Output 1025 ml  Net -1025 ml   Last 3 Weights 03/04/2020 03/02/2020 05/02/2019  Weight (lbs) 208 lb 15.9 oz 225 lb 225 lb  Weight (kg) 94.8 kg 102.059 kg 102.059 kg  Some encounter information is confidential and restricted. Go to Review Flowsheets activity to see all data.      Telemetry    SR/ST   - Personally Reviewed  ECG    No new  - Personally Reviewed  Physical Exam   GEN: No acute distress.  Appears dissheveled   Neck: No JVD Cardiac: RRR, no murmurs Chst   Sore on palpation  Respiratory: Clear to auscultation bilaterally. GI: Soft, nontender, non-distended  MS: No  edema; No deformity. Neuro:  Nonfocal  Psych: Normal affect   Labs    High Sensitivity Troponin:   Recent Labs  Lab 03/02/20 0058 03/02/20 0358 03/02/20 1024 03/02/20 1442  TROPONINIHS 23* 102* 463* 311*      Chemistry Recent Labs  Lab 03/02/20 0058 03/02/20 0358 03/02/20 0433 03/03/20 0234 03/04/20 1012  NA 131* 131* 130* 132* 130*  K 3.9 4.7 4.4 3.9 3.8  CL 96* 97*  --  96* 95*  CO2 22 20*  --  25 25  GLUCOSE 476* 383*  --  180* 164*  BUN 15 14  --  18 12  CREATININE 1.55* 1.31*  --  1.33* 0.97  CALCIUM 8.7* 9.0  --  8.2* 8.7*  PROT 6.4*  --   --   --   --   ALBUMIN 3.2*  --   --   --   --   AST 138*  --   --   --   --   ALT 99*  --   --   --   --   ALKPHOS 80  --   --   --   --   BILITOT 0.5  --   --   --   --  GFRNONAA 50* >60  --  >60 >60  ANIONGAP 13 14  --  11 10     Hematology Recent Labs  Lab 03/02/20 0058 03/02/20 0433 03/03/20 0234  WBC 9.6  --  11.9*  RBC 5.16  --  4.42  HGB 15.7 14.6 13.0  HCT 45.4 43.0 39.8  MCV 88.0  --  90.0  MCH 30.4  --  29.4  MCHC 34.6  --  32.7  RDW 13.2  --  13.2  PLT 244  --  167    BNPNo results for input(s): BNP, PROBNP in the last 168 hours.   DDimer No results for input(s): DDIMER in the last 168 hours.   Radiology    DG CHEST PORT 1 VIEW  Result Date: 03/03/2020 CLINICAL DATA:  Dyspnea. EXAM: PORTABLE CHEST 1 VIEW COMPARISON:  Radiograph yesterday.  Chest CT 09/09/2017 FINDINGS: Interval extubation and removal of enteric tube. Lower lung volumes. Cardiomegaly is grossly stable. Bronchovascular crowding versus vascular congestion. Patchy bibasilar opacities may represent atelectasis, new. No pneumothorax or pleural effusion. Right apical scarring. Advanced degenerative change of the left shoulder. IMPRESSION: 1. Lower lung volumes after extubation. Stable cardiomegaly. Bronchovascular crowding versus vascular congestion. 2. Patchy bibasilar opacities may represent atelectasis. Electronically Signed   By:  Narda RutherfordMelanie  Sanford M.D.   On: 03/03/2020 23:36   ECHOCARDIOGRAM COMPLETE  Result Date: 03/02/2020    ECHOCARDIOGRAM REPORT   Patient Name:   [name] Date of Exam: 03/02/2020 Medical Rec #:  [id number]          Height:       68.0 in Accession #:    [id number]         Weight:       225.0 lb Date of Birth:  10-08-1955         BSA:          2.149 m Patient Age:    64 years           BP:           139/78 mmHg Patient Gender: M                  HR:           96 bpm. Exam Location:  Inpatient Procedure: 2D Echo, Cardiac Doppler and Color Doppler Indications:    Cardiac arrest I46.9  History:        Patient has prior history of Echocardiogram examinations, most                 recent 10/16/2013. Stroke; Risk Factors:Hypertension, Diabetes                 and Current Smoker.  Sonographer:    Renella CunasJulia Swaim RDCS Referring Phys: [id number] JESSICA MARSHALL  Sonographer Comments: Echo performed with patient supine and on artificial respirator. IMPRESSIONS  1. Left ventricular ejection fraction, by estimation, is 45 to 50%. The left ventricle has mildly decreased function. The left ventricle demonstrates global hypokinesis. There is mild left ventricular hypertrophy. Left ventricular diastolic parameters are consistent with Grade I diastolic dysfunction (impaired relaxation).  2. Right ventricular systolic function is mildly reduced. The right ventricular size is normal. Tricuspid regurgitation signal is inadequate for assessing PA pressure.  3. The mitral valve is normal in structure. No evidence of mitral valve regurgitation. No evidence of mitral stenosis.  4. The aortic valve is tricuspid. Aortic valve regurgitation is not visualized. No aortic stenosis is present.  5. Aortic  dilatation noted. There is mild dilatation of the aortic root, measuring 42 mm.  6. The inferior vena cava is normal in size with greater than 50% respiratory variability, suggesting right atrial pressure of 3 mmHg. FINDINGS  Left Ventricle: Left  ventricular ejection fraction, by estimation, is 45 to 50%. The left ventricle has mildly decreased function. The left ventricle demonstrates global hypokinesis. The left ventricular internal cavity size was normal in size. There is  mild left ventricular hypertrophy. Left ventricular diastolic parameters are consistent with Grade I diastolic dysfunction (impaired relaxation). Right Ventricle: The right ventricular size is normal. No increase in right ventricular wall thickness. Right ventricular systolic function is mildly reduced. Tricuspid regurgitation signal is inadequate for assessing PA pressure. Left Atrium: Left atrial size was normal in size. Right Atrium: Right atrial size was normal in size. Pericardium: There is no evidence of pericardial effusion. Mitral Valve: The mitral valve is normal in structure. No evidence of mitral valve regurgitation. No evidence of mitral valve stenosis. Tricuspid Valve: The tricuspid valve is normal in structure. Tricuspid valve regurgitation is not demonstrated. Aortic Valve: The aortic valve is tricuspid. Aortic valve regurgitation is not visualized. No aortic stenosis is present. Pulmonic Valve: The pulmonic valve was normal in structure. Pulmonic valve regurgitation is trivial. Aorta: Aortic dilatation noted. There is mild dilatation of the aortic root, measuring 42 mm. Venous: The inferior vena cava is normal in size with greater than 50% respiratory variability, suggesting right atrial pressure of 3 mmHg. IAS/Shunts: No atrial level shunt detected by color flow Doppler.  LEFT VENTRICLE PLAX 2D LVIDd:         4.30 cm     Diastology LVIDs:         3.70 cm     LV e' medial:    3.68 cm/s LV PW:         1.30 cm     LV E/e' medial:  13.0 LV IVS:        1.40 cm     LV e' lateral:   4.20 cm/s LVOT diam:     2.10 cm     LV E/e' lateral: 11.4 LV SV:         36 LV SV Index:   17 LVOT Area:     3.46 cm  LV Volumes (MOD) LV vol d, MOD A2C: 78.9 ml LV vol d, MOD A4C: 95.6 ml LV vol  s, MOD A2C: 42.6 ml LV vol s, MOD A4C: 57.8 ml LV SV MOD A2C:     36.3 ml LV SV MOD A4C:     95.6 ml LV SV MOD BP:      36.6 ml RIGHT VENTRICLE RV S prime:     9.75 cm/s TAPSE (M-mode): 1.6 cm LEFT ATRIUM             Index       RIGHT ATRIUM           Index LA diam:        3.20 cm 1.49 cm/m  RA Area:     11.50 cm LA Vol (A2C):   32.0 ml 14.89 ml/m RA Volume:   24.50 ml  11.40 ml/m LA Vol (A4C):   18.0 ml 8.38 ml/m LA Biplane Vol: 24.9 ml 11.59 ml/m  AORTIC VALVE LVOT Vmax:   84.60 cm/s LVOT Vmean:  60.100 cm/s LVOT VTI:    0.103 m  AORTA Ao Root diam: 4.20 cm Ao Asc diam:  4.00 cm MITRAL VALVE MV  Area (PHT): 3.93 cm    SHUNTS MV Decel Time: 193 msec    Systemic VTI:  0.10 m MV E velocity: 48.00 cm/s  Systemic Diam: 2.10 cm MV A velocity: 65.80 cm/s MV E/A ratio:  0.73 Marca Ancona MD Electronically signed by Marca Ancona MD Signature Date/Time: 03/02/2020/4:15:08 PM    Final     Cardiac Studies   Echo:   03/03/19 1. Left ventricular ejection fraction, by estimation, is 45 to 50%. The left ventricle has mildly decreased function. The left ventricle demonstrates global hypokinesis. There is mild left ventricular hypertrophy. Left ventricular diastolic parameters are consistent with Grade I diastolic dysfunction (impaired relaxation). 2. Right ventricular systolic function is mildly reduced. The right ventricular size is normal. Tricuspid regurgitation signal is inadequate for assessing PA pressure. 3. The mitral valve is normal in structure. No evidence of mitral valve regurgitation. No evidence of mitral stenosis. 4. The aortic valve is tricuspid. Aortic valve regurgitation is not visualized. No aortic stenosis is present. 5. Aortic dilatation noted. There is mild dilatation of the aortic root, measuring 42 mm. 6. The inferior vena cava is normal in size with greater than 50% respiratory variability, suggesting right atrial pressure of 3 mmHg.  Patient Profile      [name] is a  65 y.o. male with a hx of CAD s/p CABG of unknown date, CVA, DM2, HTN, depression and hx of illicit drug use who is being seen today for the evaluation of  at the request of Raymon Mutton NP-C.   Assessment & Plan    1  Cardiac vs resp arrest.  No records   Out of hospital   DId undergo CPR prior to EMS arrival   Pt I poor historian    Now he is extubated.   Poor historian  Remote CABG (90s), hx meth use  Cant remember having CP   Cant remember prodrome of event  Did have CPR Echo as noted with mild LV dysfunciton   Given above, recomm L heart cath to define anatomy Stii waiting on records from [facility] for CABG op note Will have clerk call  Continue ASA, Crestor   On [place]ge   Dietrich Pates MD   For questions or updates, please contact [facility] Please consult [email or web address] for contact info under        Signed, Dietrich Pates, MD  03/04/2020, 11:09 AM

## 2020-03-04 NOTE — Progress Notes (Signed)
 Progress Note  Patient Name: [name] Date of Encounter: 03/04/2020  [facility] Cardiologist:    Subjective   Chest is sore with deep breath  Breathing is OK at rest   Inpatient Medications    Scheduled Meds: . aspirin  81 mg Oral Daily  . carvedilol  3.125 mg Oral BID WC  . COVID-19 mRNA vaccine (Moderna)  0.5 mL Intramuscular ONCE-1600  . docusate sodium  100 mg Oral BID  . enoxaparin (LOVENOX) injection  100 mg Subcutaneous Q12H  . famotidine  20 mg Oral BID  .  HYDROmorphone (DILAUDID) injection  0.5 mg Intravenous Once  . insulin aspart  0-15 Units Subcutaneous Q4H  . lidocaine  1 application Urethral Once  . polyethylene glycol  17 g Oral Daily  . rosuvastatin  20 mg Oral QHS  . tamsulosin  0.4 mg Oral QPC supper   Continuous Infusions:  PRN Meds: acetaminophen, docusate sodium, ipratropium-albuterol, ondansetron (ZOFRAN) IV, polyethylene glycol, sodium chloride   Vital Signs    Vitals:   03/03/20 2332 03/03/20 2335 03/04/20 0515 03/04/20 1038  BP: 126/75  133/75 (!) 143/78  Pulse: (!) 101  (!) 104 (!) 103  Resp: 18  15   Temp: 98.8 F (37.1 C)  98.8 F (37.1 C)   TempSrc: Oral  Oral   SpO2:  98% 98%   Weight:   94.8 kg   Height:        Intake/Output Summary (Last 24 hours) at 03/04/2020 1109 Last data filed at 03/04/2020 0516 Gross per 24 hour  Intake -  Output 1025 ml  Net -1025 ml   Last 3 Weights 03/04/2020 03/02/2020 05/02/2019  Weight (lbs) 208 lb 15.9 oz 225 lb 225 lb  Weight (kg) 94.8 kg 102.059 kg 102.059 kg  Some encounter information is confidential and restricted. Go to Review Flowsheets activity to see all data.      Telemetry    SR/ST   - Personally Reviewed  ECG    No new  - Personally Reviewed  Physical Exam   GEN: No acute distress.  Appears dissheveled   Neck: No JVD Cardiac: RRR, no murmurs Chst   Sore on palpation  Respiratory: Clear to auscultation bilaterally. GI: Soft, nontender, non-distended  MS: No  edema; No deformity. Neuro:  Nonfocal  Psych: Normal affect   Labs    High Sensitivity Troponin:   Recent Labs  Lab 03/02/20 0058 03/02/20 0358 03/02/20 1024 03/02/20 1442  TROPONINIHS 23* 102* 463* 311*      Chemistry Recent Labs  Lab 03/02/20 0058 03/02/20 0358 03/02/20 0433 03/03/20 0234 03/04/20 1012  NA 131* 131* 130* 132* 130*  K 3.9 4.7 4.4 3.9 3.8  CL 96* 97*  --  96* 95*  CO2 22 20*  --  25 25  GLUCOSE 476* 383*  --  180* 164*  BUN 15 14  --  18 12  CREATININE 1.55* 1.31*  --  1.33* 0.97  CALCIUM 8.7* 9.0  --  8.2* 8.7*  PROT 6.4*  --   --   --   --   ALBUMIN 3.2*  --   --   --   --   AST 138*  --   --   --   --   ALT 99*  --   --   --   --   ALKPHOS 80  --   --   --   --   BILITOT 0.5  --   --   --   --     GFRNONAA 50* >60  --  >60 >60  ANIONGAP 13 14  --  11 10     Hematology Recent Labs  Lab 03/02/20 0058 03/02/20 0433 03/03/20 0234  WBC 9.6  --  11.9*  RBC 5.16  --  4.42  HGB 15.7 14.6 13.0  HCT 45.4 43.0 39.8  MCV 88.0  --  90.0  MCH 30.4  --  29.4  MCHC 34.6  --  32.7  RDW 13.2  --  13.2  PLT 244  --  167    BNPNo results for input(s): BNP, PROBNP in the last 168 hours.   DDimer No results for input(s): DDIMER in the last 168 hours.   Radiology    DG CHEST PORT 1 VIEW  Result Date: 03/03/2020 CLINICAL DATA:  Dyspnea. EXAM: PORTABLE CHEST 1 VIEW COMPARISON:  Radiograph yesterday.  Chest CT 09/09/2017 FINDINGS: Interval extubation and removal of enteric tube. Lower lung volumes. Cardiomegaly is grossly stable. Bronchovascular crowding versus vascular congestion. Patchy bibasilar opacities may represent atelectasis, new. No pneumothorax or pleural effusion. Right apical scarring. Advanced degenerative change of the left shoulder. IMPRESSION: 1. Lower lung volumes after extubation. Stable cardiomegaly. Bronchovascular crowding versus vascular congestion. 2. Patchy bibasilar opacities may represent atelectasis. Electronically Signed   By:  Melanie  Sanford M.D.   On: 03/03/2020 23:36   ECHOCARDIOGRAM COMPLETE  Result Date: 03/02/2020    ECHOCARDIOGRAM REPORT   Patient Name:   Rahshawn DONALD Loughmiller Date of Exam: 03/02/2020 Medical Rec #:  [id number]          Height:       68.0 in Accession #:    [id number]         Weight:       225.0 lb Date of Birth:  03/11/1955         BSA:          2.149 m Patient Age:    65 years           BP:           139/78 mmHg Patient Gender: M                  HR:           96 bpm. Exam Location:  Inpatient Procedure: 2D Echo, Cardiac Doppler and Color Doppler Indications:    Cardiac arrest I46.9  History:        Patient has prior history of Echocardiogram examinations, most                 recent 10/16/2013. Stroke; Risk Factors:Hypertension, Diabetes                 and Current Smoker.  Sonographer:    Julia Swaim RDCS Referring Phys: [id number] JESSICA MARSHALL  Sonographer Comments: Echo performed with patient supine and on artificial respirator. IMPRESSIONS  1. Left ventricular ejection fraction, by estimation, is 45 to 50%. The left ventricle has mildly decreased function. The left ventricle demonstrates global hypokinesis. There is mild left ventricular hypertrophy. Left ventricular diastolic parameters are consistent with Grade I diastolic dysfunction (impaired relaxation).  2. Right ventricular systolic function is mildly reduced. The right ventricular size is normal. Tricuspid regurgitation signal is inadequate for assessing PA pressure.  3. The mitral valve is normal in structure. No evidence of mitral valve regurgitation. No evidence of mitral stenosis.  4. The aortic valve is tricuspid. Aortic valve regurgitation is not visualized. No aortic stenosis is present.  5. Aortic   dilatation noted. There is mild dilatation of the aortic root, measuring 42 mm.  6. The inferior vena cava is normal in size with greater than 50% respiratory variability, suggesting right atrial pressure of 3 mmHg. FINDINGS  Left Ventricle: Left  ventricular ejection fraction, by estimation, is 45 to 50%. The left ventricle has mildly decreased function. The left ventricle demonstrates global hypokinesis. The left ventricular internal cavity size was normal in size. There is  mild left ventricular hypertrophy. Left ventricular diastolic parameters are consistent with Grade I diastolic dysfunction (impaired relaxation). Right Ventricle: The right ventricular size is normal. No increase in right ventricular wall thickness. Right ventricular systolic function is mildly reduced. Tricuspid regurgitation signal is inadequate for assessing PA pressure. Left Atrium: Left atrial size was normal in size. Right Atrium: Right atrial size was normal in size. Pericardium: There is no evidence of pericardial effusion. Mitral Valve: The mitral valve is normal in structure. No evidence of mitral valve regurgitation. No evidence of mitral valve stenosis. Tricuspid Valve: The tricuspid valve is normal in structure. Tricuspid valve regurgitation is not demonstrated. Aortic Valve: The aortic valve is tricuspid. Aortic valve regurgitation is not visualized. No aortic stenosis is present. Pulmonic Valve: The pulmonic valve was normal in structure. Pulmonic valve regurgitation is trivial. Aorta: Aortic dilatation noted. There is mild dilatation of the aortic root, measuring 42 mm. Venous: The inferior vena cava is normal in size with greater than 50% respiratory variability, suggesting right atrial pressure of 3 mmHg. IAS/Shunts: No atrial level shunt detected by color flow Doppler.  LEFT VENTRICLE PLAX 2D LVIDd:         4.30 cm     Diastology LVIDs:         3.70 cm     LV e' medial:    3.68 cm/s LV PW:         1.30 cm     LV E/e' medial:  13.0 LV IVS:        1.40 cm     LV e' lateral:   4.20 cm/s LVOT diam:     2.10 cm     LV E/e' lateral: 11.4 LV SV:         36 LV SV Index:   17 LVOT Area:     3.46 cm  LV Volumes (MOD) LV vol d, MOD A2C: 78.9 ml LV vol d, MOD A4C: 95.6 ml LV vol  s, MOD A2C: 42.6 ml LV vol s, MOD A4C: 57.8 ml LV SV MOD A2C:     36.3 ml LV SV MOD A4C:     95.6 ml LV SV MOD BP:      36.6 ml RIGHT VENTRICLE RV S prime:     9.75 cm/s TAPSE (M-mode): 1.6 cm LEFT ATRIUM             Index       RIGHT ATRIUM           Index LA diam:        3.20 cm 1.49 cm/m  RA Area:     11.50 cm LA Vol (A2C):   32.0 ml 14.89 ml/m RA Volume:   24.50 ml  11.40 ml/m LA Vol (A4C):   18.0 ml 8.38 ml/m LA Biplane Vol: 24.9 ml 11.59 ml/m  AORTIC VALVE LVOT Vmax:   84.60 cm/s LVOT Vmean:  60.100 cm/s LVOT VTI:    0.103 m  AORTA Ao Root diam: 4.20 cm Ao Asc diam:  4.00 cm MITRAL VALVE MV  Area (PHT): 3.93 cm    SHUNTS MV Decel Time: 193 msec    Systemic VTI:  0.10 m MV E velocity: 48.00 cm/s  Systemic Diam: 2.10 cm MV A velocity: 65.80 cm/s MV E/A ratio:  0.73 Marca Ancona MD Electronically signed by Marca Ancona MD Signature Date/Time: 03/02/2020/4:15:08 PM    Final     Cardiac Studies   Echo:   03/03/19 1. Left ventricular ejection fraction, by estimation, is 45 to 50%. The left ventricle has mildly decreased function. The left ventricle demonstrates global hypokinesis. There is mild left ventricular hypertrophy. Left ventricular diastolic parameters are consistent with Grade I diastolic dysfunction (impaired relaxation). 2. Right ventricular systolic function is mildly reduced. The right ventricular size is normal. Tricuspid regurgitation signal is inadequate for assessing PA pressure. 3. The mitral valve is normal in structure. No evidence of mitral valve regurgitation. No evidence of mitral stenosis. 4. The aortic valve is tricuspid. Aortic valve regurgitation is not visualized. No aortic stenosis is present. 5. Aortic dilatation noted. There is mild dilatation of the aortic root, measuring 42 mm. 6. The inferior vena cava is normal in size with greater than 50% respiratory variability, suggesting right atrial pressure of 3 mmHg.  Patient Profile      Tenzin Edelman is a  65 y.o. male with a hx of CAD s/p CABG of unknown date, CVA, DM2, HTN, depression and hx of illicit drug use who is being seen today for the evaluation of  at the request of Raymon Mutton NP-C.   Assessment & Plan    1  Cardiac vs resp arrest.  No records   Out of hospital   DId undergo CPR prior to EMS arrival   Pt I poor historian    Now he is extubated.   Poor historian  Remote CABG (90s), hx meth use  Cant remember having CP   Cant remember prodrome of event  Did have CPR Echo as noted with mild LV dysfunciton   Given above, recomm L heart cath to define anatomy Stii waiting on records from [facility] for CABG op note Will have clerk call  Continue ASA, Crestor   On Corege   Dietrich Pates MD   For questions or updates, please contact [facility] Please consult [email or web address] for contact info under        Signed, Dietrich Pates, MD  03/04/2020, 11:09 AM

## 2020-03-04 NOTE — Evaluation (Signed)
Physical Therapy Evaluation Patient Details Name: [name] MRN: [id number] DOB: Feb 10, 1956 Today's Date: 03/04/2020   History of Present Illness  65 yo with past medical history significant for essential hypertension, type 2 diabetes, prior CVA and chronic depression who was brought in by ems after witnessed arrest.  Clinical Impression  Pt seen for evaluation, pt limited by pain during evaluation. Pt I prior to admission but due to pain is unable to perform functional mobility tasks without increased assistance. Pt states he has neighbors who can help him once discharged. Pt was limited by pain and demonstrating deficits in strength, coordination, balance and activity tolerance. Pt will benefit from skilled PT to address deficits to maximize independence with functional mobility prior to discharge.      Follow Up Recommendations Home health PT;Supervision/Assistance - 24 hour    Equipment Recommendations  None recommended by PT    Recommendations for Other Services       Precautions / Restrictions Precautions Precautions: Fall (external defibrilator) Precaution Comments: external defibrilator Restrictions Weight Bearing Restrictions: No Other Position/Activity Restrictions: sternal chect pain, external defibrillator in place.      Mobility  Bed Mobility Overal bed mobility: Needs Assistance Bed Mobility: Rolling Rolling: Max assist         General bed mobility comments: attempted to perform rolling R and L for linen change requiring max A due to pain and unable to fully complete roll. performed bridges to scoot up in bed wtih mod A and bed in trendelenberg position    Transfers                 General transfer comment: pt refused out of bed activities. pt placed in chair position in bed with increased pain request back of bed to be reclined slightly  Ambulation/Gait                Stairs            Wheelchair Mobility    Modified  Rankin (Stroke Patients Only)       Balance                                             Pertinent Vitals/Pain Pain Assessment: 0-10 Pain Score: 10-Worst pain ever Pain Location: sternal area from compressions. Pain Descriptors / Indicators: Stabbing;Tender;Guarding;Grimacing Pain Intervention(s): Limited activity within patient's tolerance    Home Living Family/patient expects to be discharged to:: Private residence Living Arrangements: Alone   Type of Home: Apartment Home Access: Level entry     Home Layout: One level Home Equipment: Environmental consultant - 2 wheels;Shower seat;Cane - single point Additional Comments: states he has a steep incliner to get to his apartment.    Prior Function Level of Independence: Independent with assistive device(s)         Comments: pt states he uses AD 25% of the time     Hand Dominance   Dominant Hand: Right    Extremity/Trunk Assessment   Upper Extremity Assessment Upper Extremity Assessment: Defer to OT evaluation    Lower Extremity Assessment Lower Extremity Assessment: RLE deficits/detail;LLE deficits/detail RLE Deficits / Details: limited knee flexion when attempting to bridge up in bed; pt able to perform LAQ through 60% of ROM RLE: Unable to fully assess due to pain LLE Deficits / Details: pt able to perform LAQ through 80% of ROM LLE: Unable to  fully assess due to pain       Communication   Communication: No difficulties  Cognition Arousal/Alertness: Awake/alert Behavior During Therapy: Flat affect Overall Cognitive Status: Within Functional Limits for tasks assessed                                 General Comments: pt with increased pain and coughing up blood prior to eval (RN aware)      General Comments General comments (skin integrity, edema, etc.): VSS pt on O2    Exercises     Assessment/Plan    PT Assessment Patient needs continued PT services  PT Problem List  Pain;Decreased strength;Decreased mobility;Decreased coordination;Decreased activity tolerance       PT Treatment Interventions Therapeutic exercise;Gait training;Balance training;Therapeutic activities;Patient/family education    PT Goals (Current goals can be found in the Care Plan section)  Acute Rehab PT Goals Patient Stated Goal: I want my chest to stop hurting PT Goal Formulation: With patient Time For Goal Achievement: 03/18/20 Potential to Achieve Goals: Fair    Frequency Min 3X/week   Barriers to discharge        Co-evaluation               AM-PAC PT "6 Clicks" Mobility  Outcome Measure Help needed turning from your back to your side while in a flat bed without using bedrails?: Total Help needed moving from lying on your back to sitting on the side of a flat bed without using bedrails?: Total Help needed moving to and from a bed to a chair (including a wheelchair)?: Total Help needed standing up from a chair using your arms (e.g., wheelchair or bedside chair)?: Total Help needed to walk in hospital room?: Total Help needed climbing 3-5 steps with a railing? : Total 6 Click Score: 6    End of Session Equipment Utilized During Treatment: Oxygen Activity Tolerance: Patient limited by pain Patient left: in bed;with call bell/phone within reach;with chair alarm set Nurse Communication: Mobility status PT Visit Diagnosis: Pain;Muscle weakness (generalized) (M62.81) Pain - part of body:  (chest wtih any movement)    Time: 1937-9024 PT Time Calculation (min) (ACUTE ONLY): 13 min   Charges:   PT Evaluation $PT Eval Low Complexity: 1 Low          Ginette Otto, DPT Acute Rehabilitation Services [phone]  Lucretia Field 03/04/2020, 9:14 AM

## 2020-03-04 NOTE — Progress Notes (Addendum)
PROGRESS NOTE  [name] ELF:[id number] DOB: July 27, 1955 DOA: 03/02/2020 PCP: Dema Severin, NP  HPI/Recap of past 24 hours: 65 yo with past medical history significant for essential hypertension, type 2 diabetes, prior CVA and chronic depression who was brought in by ems after witnessed arrest. Pt was with family when he "suddenly locked up" and fell down. cpr was initiated immediately. When ems arrives pt was agonal with respirations but strong carotid pulse. King airway was placed along with IO in L shin and he was transported to [facility].  There was a report of a white powder substance near the patient where he was found down.  Patient achieved ROSC without shock or ACLS medications per EMS.  He was unresponsive and sedated on vent.  Admitted to the ICU.  Extubated to nasal cannula on 03/02/2020.  Transferred to [facility] service on 03/03/2020.  03/04/20: Patient was seen and examined at his bedside.  He reports chest pain located centrally at the site of CPR, worse with taking a deep breath.  Also reports hemoptysis, on full dose Lovenox BID.  His oxygen requirement has increased overnight, up to 5 L.  Assessment/Plan: Active Problems:   Cardiac arrest (HCC)    Acute hypoxic and hypercarbic respiratory failure suspect secondary to atelectasis Initially intubated due to cardiac arrest and unresponsiveness Extubated on 03/02/2020 He is currently requiring 5 L to maintain O2 saturation greater than 90% Wean off oxygen supplementation as tolerated Personally reviewed chest x-ray done on 03/02/2020 showing atelectasis at the bases bilaterally.  No evidence of pulmonary edema or lobular infiltrates. New onset hemoptysis and increase in oxygen demand, monitor on full dose lovenox  New onset hemoptysis in the setting of full dose lovenox with recent intubation/extubation Monitor H&H for now He is on aspirin and xarelto PTA due to hx of cva  History of CVA Was started on full dose lovenox while in  the ICU Restart home Xarelto for secondary CVA prevention when ok with cardiology He is also on ASA 81 mg daily  Elevated troponin, rule out ACS Presented after cardiac arrest Troponin has peaked at 463 and trended down. 2D echo with reduced LVEF 45-50% Is currently on aspirin, Coreg 3.125 mg twice daily, Crestor 20 mg daily Reports chest discomfort this morning at the site of his chest compressions. Continue close monitoring Cardiology following   Cardiac arrest, unknown etiology /chest pain/pleuritic pain was seen by cardiology 2D echo done on 03/02/2020 revealing LVEF 45 to 50% with left ventricle global hypokinesis, grade 1 diastolic dysfunction IV morphine as needed for severe pain  AKI Baseline creatinine appears to be 0.8 with GFR 26 Presented with creatinine of 1.55 with GFR of 50 CR is downtrending Avoid nephrotoxins, hypotension Renal panel in the morning  Hypervolemic hyponatremia Serum sodium 130 Continue to monitor Repeat renal panel in the morning.  Acute on chronic combined diastolic and systolic CHF Obtain BNP Last 2D echo with findings as stated above, LVEF reduced from prior Continue Coreg, medications as recommended by cardiology Closely monitor volume status Strict I's and O's and daily weights  Type 2 diabetes with hyperglycemia Hemoglobin A1c 11.3 on 03/02/2020 Continue insulin sliding scale code   Acute toxic metabolic encephalopathy  - resolved  --UDS +polysubstance  --CT head neg   Polysubstance abuse UDS + amphetamins, benzo and THC CIWA protocol for history of benzodiazepine abuse Polysubstance abuse counseling at bedside    Code Status: Full code  Family Communication: None at bedside.  Disposition Plan: Likely  will DC to home with home health services   Consultants:  Cardiology  PCCM  Procedures:  Intubation on admission  Extubation on 03/02/2020  Antimicrobials:  None.  DVT prophylaxis: Subcu full dose Lovenox  BID  Status is: Inpatient    Dispo:  Patient From: Home  Planned Disposition: Home  Expected discharge date: 03/05/2020  Medically stable for discharge: No, ongoing management of acute hypoxic hypercarbic respiratory failure.         Objective: Vitals:   03/04/20 0515 03/04/20 1038 03/04/20 1239 03/04/20 1540  BP: 133/75 (!) 143/78 126/60 (!) 147/69  Pulse: (!) 104 (!) 103 94   Resp: 15  20 20   Temp: 98.8 F (37.1 C)  98.4 F (36.9 C) 98 F (36.7 C)  TempSrc: Oral  Oral Oral  SpO2: 98%  98% 98%  Weight: 94.8 kg     Height:        Intake/Output Summary (Last 24 hours) at 03/04/2020 1632 Last data filed at 03/04/2020 1200 Gross per 24 hour  Intake --  Output 1525 ml  Net -1525 ml   Filed Weights   03/02/20 0306 03/04/20 0515  Weight: 102.1 kg 94.8 kg    Exam:  . General: 65 y.o. year-old male well-developed well-nourished appears uncomfortable due to chest pain.  Alert oriented x3.  . Cardiovascular: Regular rate and rhythm no rubs or gallops. 68 Respiratory: Mild rales at bases no wheezing noted.  Poor respiratory effort. . Abdomen: Soft nontender normal bowel sounds present.  Musculoskeletal: Trace lower extremity edema bilaterally.   . Skin: No ulcerative lesions noted. Marland Kitchen Psychiatry: Mood is appropriate for condition and setting.  Data Reviewed: CBC: Recent Labs  Lab 03/02/20 0058 03/02/20 0433 03/03/20 0234 03/04/20 1336  WBC 9.6  --  11.9* 5.9  NEUTROABS 7.1  --   --   --   HGB 15.7 14.6 13.0 13.6  HCT 45.4 43.0 39.8 39.4  MCV 88.0  --  90.0 86.2  PLT 244  --  167 172   Basic Metabolic Panel: Recent Labs  Lab 03/02/20 0058 03/02/20 0358 03/02/20 0433 03/03/20 0234 03/04/20 1012 03/04/20 1336  NA 131* 131* 130* 132* 130*  --   K 3.9 4.7 4.4 3.9 3.8  --   CL 96* 97*  --  96* 95*  --   CO2 22 20*  --  25 25  --   GLUCOSE 476* 383*  --  180* 164*  --   BUN 15 14  --  18 12  --   CREATININE 1.55* 1.31*  --  1.33* 0.97  --   CALCIUM 8.7*  9.0  --  8.2* 8.7*  --   MG  --  2.0  --   --   --  1.7  PHOS  --  4.8*  --   --   --  3.3   GFR: Estimated Creatinine Clearance: 88.2 mL/min (by C-G formula based on SCr of 0.97 mg/dL). Liver Function Tests: Recent Labs  Lab 03/02/20 0058  AST 138*  ALT 99*  ALKPHOS 80  BILITOT 0.5  PROT 6.4*  ALBUMIN 3.2*   No results for input(s): LIPASE, AMYLASE in the last 168 hours. No results for input(s): AMMONIA in the last 168 hours. Coagulation Profile: No results for input(s): INR, PROTIME in the last 168 hours. Cardiac Enzymes: No results for input(s): CKTOTAL, CKMB, CKMBINDEX, TROPONINI in the last 168 hours. BNP (last 3 results) No results for input(s): PROBNP in the last 8760  hours. HbA1C: Recent Labs    03/02/20 0358  HGBA1C 11.3*   CBG: Recent Labs  Lab 03/03/20 1723 03/03/20 2114 03/04/20 0747 03/04/20 0833 03/04/20 1208  GLUCAP 135* 138* 165* 145* 199*   Lipid Profile: No results for input(s): CHOL, HDL, LDLCALC, TRIG, CHOLHDL, LDLDIRECT in the last 72 hours. Thyroid Function Tests: No results for input(s): TSH, T4TOTAL, FREET4, T3FREE, THYROIDAB in the last 72 hours. Anemia Panel: No results for input(s): VITAMINB12, FOLATE, FERRITIN, TIBC, IRON, RETICCTPCT in the last 72 hours. Urine analysis:    Component Value Date/Time   COLORURINE YELLOW 03/02/2020 0355   APPEARANCEUR CLEAR 03/02/2020 0355   LABSPEC 1.032 (H) 03/02/2020 0355   PHURINE 6.0 03/02/2020 0355   GLUCOSEU >=500 (A) 03/02/2020 0355   HGBUR NEGATIVE 03/02/2020 0355   BILIRUBINUR NEGATIVE 03/02/2020 0355   KETONESUR NEGATIVE 03/02/2020 0355   PROTEINUR NEGATIVE 03/02/2020 0355   UROBILINOGEN 0.2 10/16/2013 0837   NITRITE NEGATIVE 03/02/2020 0355   LEUKOCYTESUR NEGATIVE 03/02/2020 0355   Sepsis Labs: @LABRCNTIP (procalcitonin:4,lacticidven:4)  ) Recent Results (from the past 240 hour(s))  Resp Panel by RT-PCR (Flu A&B, Covid) Nasopharyngeal Swab     Status: None   Collection Time:  03/02/20 12:59 AM   Specimen: Nasopharyngeal Swab; Nasopharyngeal(NP) swabs in vial transport medium  Result Value Ref Range Status   SARS Coronavirus 2 by RT PCR NEGATIVE NEGATIVE Final    Comment: (NOTE) SARS-CoV-2 target nucleic acids are NOT DETECTED.  The SARS-CoV-2 RNA is generally detectable in upper respiratory specimens during the acute phase of infection. The lowest concentration of SARS-CoV-2 viral copies this assay can detect is 138 copies/mL. A negative result does not preclude SARS-Cov-2 infection and should not be used as the sole basis for treatment or other patient management decisions. A negative result may occur with  improper specimen collection/handling, submission of specimen other than nasopharyngeal swab, presence of viral mutation(s) within the areas targeted by this assay, and inadequate number of viral copies(<138 copies/mL). A negative result must be combined with clinical observations, patient history, and epidemiological information. The expected result is Negative.  Fact Sheet for Patients:  BloggerCourse.comhttps://www.fda.gov/media/152166/download  Fact Sheet for Healthcare Providers:  SeriousBroker.ithttps://www.fda.gov/media/152162/download  This test is no t yet approved or cleared by the [place] FDA and  has been authorized for detection and/or diagnosis of SARS-CoV-2 by FDA under an Emergency Use Authorization (EUA). This EUA will remain  in effect (meaning this test can be used) for the duration of the COVID-19 declaration under Section 564(b)(1) of the Act, 21 U.S.C.section 360bbb-3(b)(1), unless the authorization is terminated  or revoked sooner.       Influenza A by PCR NEGATIVE NEGATIVE Final   Influenza B by PCR NEGATIVE NEGATIVE Final    Comment: (NOTE) The Xpert Xpress SARS-CoV-2/FLU/RSV plus assay is intended as an aid in the diagnosis of influenza from Nasopharyngeal swab specimens and should not be used as a sole basis for treatment. Nasal washings  and aspirates are unacceptable for Xpert Xpress SARS-CoV-2/FLU/RSV testing.  Fact Sheet for Patients: BloggerCourse.comhttps://www.fda.gov/media/152166/download  Fact Sheet for Healthcare Providers: SeriousBroker.ithttps://www.fda.gov/media/152162/download  This test is not yet approved or cleared by the [place] FDA and has been authorized for detection and/or diagnosis of SARS-CoV-2 by FDA under an Emergency Use Authorization (EUA). This EUA will remain in effect (meaning this test can be used) for the duration of the COVID-19 declaration under Section 564(b)(1) of the Act, 21 U.S.C. section 360bbb-3(b)(1), unless the authorization is terminated or revoked.  Performed at [facility], [address], [place], [place] [address]       Studies: DG CHEST PORT 1 VIEW  Result Date: 03/03/2020 CLINICAL DATA:  Dyspnea. EXAM: PORTABLE CHEST 1 VIEW COMPARISON:  Radiograph yesterday.  Chest CT 09/09/2017 FINDINGS: Interval extubation and removal of enteric tube. Lower lung volumes. Cardiomegaly is grossly stable. Bronchovascular crowding versus vascular congestion. Patchy bibasilar opacities may represent atelectasis, new. No pneumothorax or pleural effusion. Right apical scarring. Advanced degenerative change of the left shoulder. IMPRESSION: 1. Lower lung volumes after extubation. Stable cardiomegaly. Bronchovascular crowding versus vascular congestion. 2. Patchy bibasilar opacities may represent atelectasis. Electronically Signed   By: Narda Rutherford M.D.   On: 03/03/2020 23:36    Scheduled Meds: . aspirin  81 mg Oral Daily  . carvedilol  3.125 mg Oral BID WC  . docusate sodium  100 mg Oral BID  . enoxaparin (LOVENOX) injection  100 mg Subcutaneous Q12H  . famotidine  20 mg Oral BID  . folic acid  1 mg Oral Daily  .  HYDROmorphone (DILAUDID) injection  0.5 mg Intravenous Once  . insulin aspart  0-15 Units Subcutaneous Q4H  . lidocaine  1 application Urethral Once  . multivitamin with minerals  1 tablet  Oral Daily  . nicotine  21 mg Transdermal Daily  . polyethylene glycol  17 g Oral Daily  . rosuvastatin  20 mg Oral QHS  . tamsulosin  0.4 mg Oral QPC supper  . thiamine  100 mg Oral Daily   Or  . thiamine  100 mg Intravenous Daily    Continuous Infusions: . sodium chloride 30 mL/hr at 03/04/20 1556     LOS: 2 days     Darlin Drop, MD [facility] Pager [phone]  If 7PM-7AM, please contact night-coverage [email or web address] Password [facility] 03/04/2020, 4:32 PM

## 2020-03-05 ENCOUNTER — Encounter (HOSPITAL_COMMUNITY)
Admission: EM | Disposition: A | Discharge status: Home Health | Payer: Self-pay | Source: Home / Self Care | Attending: Internal Medicine

## 2020-03-05 ENCOUNTER — Encounter (HOSPITAL_COMMUNITY): Payer: Self-pay | Admitting: Internal Medicine

## 2020-03-05 DIAGNOSIS — I2510 Atherosclerotic heart disease of native coronary artery without angina pectoris: Secondary | ICD-10-CM | POA: Diagnosis not present

## 2020-03-05 DIAGNOSIS — I25810 Atherosclerosis of coronary artery bypass graft(s) without angina pectoris: Secondary | ICD-10-CM | POA: Diagnosis not present

## 2020-03-05 DIAGNOSIS — I214 Non-ST elevation (NSTEMI) myocardial infarction: Secondary | ICD-10-CM | POA: Diagnosis not present

## 2020-03-05 DIAGNOSIS — I2699 Other pulmonary embolism without acute cor pulmonale: Secondary | ICD-10-CM | POA: Diagnosis not present

## 2020-03-05 DIAGNOSIS — I469 Cardiac arrest, cause unspecified: Secondary | ICD-10-CM | POA: Diagnosis not present

## 2020-03-05 HISTORY — PX: CORONARY STENT INTERVENTION: CATH118234

## 2020-03-05 HISTORY — PX: LEFT HEART CATH AND CORS/GRAFTS ANGIOGRAPHY: CATH118250

## 2020-03-05 LAB — BASIC METABOLIC PANEL
Anion gap: 12 (ref 5–15)
BUN: 15 mg/dL (ref 8–23)
CO2: 26 mmol/L (ref 22–32)
Calcium: 8.8 mg/dL — ABNORMAL LOW (ref 8.9–10.3)
Chloride: 93 mmol/L — ABNORMAL LOW (ref 98–111)
Creatinine, Ser: 0.95 mg/dL (ref 0.61–1.24)
GFR, Estimated: >60 mL/min (ref >60)
Glucose, Bld: 175 mg/dL — ABNORMAL HIGH (ref 70–99)
Potassium: 3.8 mmol/L (ref 3.5–5.1)
Sodium: 131 mmol/L — ABNORMAL LOW (ref 135–145)

## 2020-03-05 LAB — GLUCOSE, CAPILLARY
Glucose-Capillary: 140 mg/dL — ABNORMAL HIGH (ref 70–99)
Glucose-Capillary: 173 mg/dL — ABNORMAL HIGH (ref 70–99)
Glucose-Capillary: 159 mg/dL — ABNORMAL HIGH (ref 70–99)
Glucose-Capillary: 180 mg/dL — ABNORMAL HIGH (ref 70–99)
Glucose-Capillary: 184 mg/dL — ABNORMAL HIGH (ref 70–99)

## 2020-03-05 LAB — POCT ACTIVATED CLOTTING TIME
Activated Clotting Time: 309 seconds
Activated Clotting Time: 184 seconds
Activated Clotting Time: 172 seconds

## 2020-03-05 SURGERY — LEFT HEART CATH AND CORS/GRAFTS ANGIOGRAPHY
Anesthesia: LOCAL

## 2020-03-05 MED ORDER — LABETALOL HCL 5 MG/ML IV SOLN
10.0000 mg | INTRAVENOUS | Status: AC | PRN
  Administered 2020-03-05: 10 mg via INTRAVENOUS

## 2020-03-05 MED ORDER — IOHEXOL 350 MG/ML SOLN
INTRAVENOUS | Status: AC
  Filled 2020-03-05: qty 1

## 2020-03-05 MED ORDER — NITROGLYCERIN 1 MG/10 ML FOR IR/CATH LAB
INTRA_ARTERIAL | Status: DC | PRN
  Administered 2020-03-05 (×2): 200 ug via INTRACORONARY

## 2020-03-05 MED ORDER — LABETALOL HCL 5 MG/ML IV SOLN
INTRAVENOUS | Status: AC
  Filled 2020-03-05: qty 4

## 2020-03-05 MED ORDER — ENOXAPARIN SODIUM 40 MG/0.4ML ~~LOC~~ SOLN
40.0000 mg | SUBCUTANEOUS | Status: DC
  Administered 2020-03-06: 40 mg via SUBCUTANEOUS
  Filled 2020-03-05: qty 0.4

## 2020-03-05 MED ORDER — MIDAZOLAM HCL 2 MG/2ML IJ SOLN
INTRAMUSCULAR | Status: DC | PRN
  Administered 2020-03-05 (×2): 1 mg via INTRAVENOUS

## 2020-03-05 MED ORDER — BIVALIRUDIN TRIFLUOROACETATE 250 MG IV SOLR
INTRAVENOUS | Status: AC
  Filled 2020-03-05: qty 250

## 2020-03-05 MED ORDER — SODIUM CHLORIDE 0.9% FLUSH: 3.0000 mL | INTRAVENOUS | Status: DC | PRN

## 2020-03-05 MED ORDER — FENTANYL CITRATE (PF) 100 MCG/2ML IJ SOLN
INTRAMUSCULAR | Status: AC
  Filled 2020-03-05: qty 2

## 2020-03-05 MED ORDER — TICAGRELOR 90 MG PO TABS
ORAL_TABLET | ORAL | Status: DC | PRN
  Administered 2020-03-05: 180 mg via ORAL

## 2020-03-05 MED ORDER — HEPARIN (PORCINE) IN NACL 1000-0.9 UT/500ML-% IV SOLN
INTRAVENOUS | Status: AC
  Filled 2020-03-05: qty 1000

## 2020-03-05 MED ORDER — HEPARIN (PORCINE) IN NACL 1000-0.9 UT/500ML-% IV SOLN
INTRAVENOUS | Status: DC | PRN
  Administered 2020-03-05 (×2): 500 mL

## 2020-03-05 MED ORDER — TICAGRELOR 90 MG PO TABS
90.0000 mg | ORAL_TABLET | Freq: Two times a day (BID) | ORAL | Status: DC
  Administered 2020-03-05 – 2020-03-06 (×2): 90 mg via ORAL
  Filled 2020-03-05 (×2): qty 1

## 2020-03-05 MED ORDER — NITROGLYCERIN 1 MG/10 ML FOR IR/CATH LAB
INTRA_ARTERIAL | Status: AC
  Filled 2020-03-05: qty 10

## 2020-03-05 MED ORDER — HYDRALAZINE HCL 20 MG/ML IJ SOLN: 10.0000 mg | INTRAMUSCULAR | Status: AC | PRN

## 2020-03-05 MED ORDER — BIVALIRUDIN BOLUS VIA INFUSION - CUPID
INTRAVENOUS | Status: DC | PRN
  Administered 2020-03-05: 68.775 mg via INTRAVENOUS

## 2020-03-05 MED ORDER — LIDOCAINE HCL (PF) 1 % IJ SOLN
INTRAMUSCULAR | Status: AC
  Filled 2020-03-05: qty 30

## 2020-03-05 MED ORDER — LIDOCAINE HCL (PF) 1 % IJ SOLN
INTRAMUSCULAR | Status: DC | PRN
  Administered 2020-03-05: 15 mL

## 2020-03-05 MED ORDER — SODIUM CHLORIDE 0.9 % IV SOLN: INTRAVENOUS | Status: AC

## 2020-03-05 MED ORDER — IOHEXOL 350 MG/ML SOLN
INTRAVENOUS | Status: DC | PRN
  Administered 2020-03-05: 210 mL

## 2020-03-05 MED ORDER — SODIUM CHLORIDE 0.9 % IV SOLN
INTRAVENOUS | Status: DC | PRN
  Administered 2020-03-05: 1.75 mg/kg/h via INTRAVENOUS

## 2020-03-05 MED ORDER — SODIUM CHLORIDE 0.9 % IV SOLN: 250.0000 mL | INTRAVENOUS | Status: DC | PRN

## 2020-03-05 MED ORDER — DM-GUAIFENESIN ER 30-600 MG PO TB12
2.0000 | ORAL_TABLET | Freq: Two times a day (BID) | ORAL | Status: AC
  Administered 2020-03-05 – 2020-03-08 (×6): 2 via ORAL
  Filled 2020-03-05 (×7): qty 2

## 2020-03-05 MED ORDER — MIDAZOLAM HCL 2 MG/2ML IJ SOLN
INTRAMUSCULAR | Status: AC
  Filled 2020-03-05: qty 2

## 2020-03-05 MED ORDER — LOPERAMIDE HCL 2 MG PO CAPS
2.0000 mg | ORAL_CAPSULE | ORAL | Status: DC | PRN
  Filled 2020-03-05: qty 1

## 2020-03-05 MED ORDER — TICAGRELOR 90 MG PO TABS
ORAL_TABLET | ORAL | Status: AC
  Filled 2020-03-05: qty 1

## 2020-03-05 MED ORDER — SODIUM CHLORIDE 0.9% FLUSH
3.0000 mL | Freq: Two times a day (BID) | INTRAVENOUS | Status: DC
  Administered 2020-03-05 – 2020-03-09 (×6): 3 mL via INTRAVENOUS

## 2020-03-05 MED ORDER — FENTANYL CITRATE (PF) 100 MCG/2ML IJ SOLN
INTRAMUSCULAR | Status: DC | PRN
  Administered 2020-03-05 (×2): 25 ug via INTRAVENOUS

## 2020-03-05 SURGICAL SUPPLY — 22 items
BALLN EMERGE MR 2.0X8 (BALLOONS) ×2
BALLN SAPPHIRE ~~LOC~~ 2.75X8 (BALLOONS) ×1 IMPLANT
BALLN WOLVERINE 2.25X10 (BALLOONS) ×2
BALLOON EMERGE MR 2.0X8 (BALLOONS) IMPLANT
BALLOON WOLVERINE 2.25X10 (BALLOONS) IMPLANT
CATH INFINITI 5 FR IM (CATHETERS) ×1 IMPLANT
CATH INFINITI 5 FR MPA2 (CATHETERS) ×1 IMPLANT
CATH INFINITI 5FR MULTPACK ANG (CATHETERS) ×1 IMPLANT
CATHETER LAUNCHER 6FR MP1 (CATHETERS) ×1 IMPLANT
ELECT DEFIB PAD ADLT CADENCE (PAD) ×1 IMPLANT
KIT ENCORE 26 ADVANTAGE (KITS) ×1 IMPLANT
KIT HEART LEFT (KITS) ×2 IMPLANT
KIT MICROPUNCTURE NIT STIFF (SHEATH) ×1 IMPLANT
PACK CARDIAC CATHETERIZATION (CUSTOM PROCEDURE TRAY) ×2 IMPLANT
SHEATH PINNACLE 5F 10CM (SHEATH) ×1 IMPLANT
SHEATH PINNACLE 6F 10CM (SHEATH) ×1 IMPLANT
SHEATH PROBE COVER 6X72 (BAG) ×1 IMPLANT
STENT RESOLUTE ONYX 2.5X12 (Permanent Stent) ×1 IMPLANT
TRANSDUCER W/STOPCOCK (MISCELLANEOUS) ×2 IMPLANT
TUBING CIL FLEX 10 FLL-RA (TUBING) ×2 IMPLANT
WIRE EMERALD 3MM-J .035X150CM (WIRE) ×1 IMPLANT
WIRE RUNTHROUGH .014X180CM (WIRE) ×1 IMPLANT

## 2020-03-05 NOTE — Progress Notes (Signed)
OT Cancellation Note  Patient Details Name: Skylier Kretschmer MRN: [id number] DOB: [birth year]   Cancelled Treatment:    Reason Eval/Treat Not Completed: Patient at procedure or test/ unavailable (Pt in cath lab. Will continue to follow.)  Evern Bio 03/05/2020, 9:40 AM  Martie Round, OTR/L Acute Rehabilitation Services Pager: [phone] Office: [phone]

## 2020-03-05 NOTE — Progress Notes (Addendum)
Site area: Right groin - 6FR sheath removed from right groin    Site Prior to Removal:  Level 0   Pressure Applied For 20 MINUTES     Bedrest Beginning at 1315   Manual:   Yes.     Patient Status During Pull:  stable   Post Pull Groin Site:  Level 0   Post Pull Instructions Given:  Yes.     Post Pull Pulses Present:  Yes.     Dressing Applied:  Yes.     Comments:  Educated patient on the need to apply pressure to site when coughing or clearing his throat

## 2020-03-05 NOTE — Progress Notes (Addendum)
Progress Note  Patient Name: [name] Date of Encounter: 03/05/2020  Primary Cardiologist: Dietrich Pates, MD  (new this admission)  Subjective   Seen in cath lab holding post cath without acute complaints. Denies CP, SOB.  Prior notes reviewed - patient has had mild hemoptysis this admission on full dose Lovenox, which was reduced to 40mg  daily starting today. He still reports scant hemoptysis this morning. Xarelto 10mg  was listed on prior to admission meds but he reports someone took him off this about 6 months ago somewhere in [place], unclear why, no records available.  Inpatient Medications    Scheduled Meds: . [MAR Hold] aspirin  81 mg Oral Daily  . [MAR Hold] carvedilol  3.125 mg Oral BID WC  . [MAR Hold] docusate sodium  100 mg Oral BID  . [MAR Hold] enoxaparin (LOVENOX) injection  100 mg Subcutaneous Q12H  . [MAR Hold] famotidine  20 mg Oral BID  . [MAR Hold] folic acid  1 mg Oral Daily  . [MAR Hold]  HYDROmorphone (DILAUDID) injection  0.5 mg Intravenous Once  . [MAR Hold] insulin aspart  0-15 Units Subcutaneous Q4H  . [MAR Hold] lidocaine  1 application Urethral Once  . [MAR Hold] multivitamin with minerals  1 tablet Oral Daily  . [MAR Hold] nicotine  21 mg Transdermal Daily  . [MAR Hold] polyethylene glycol  17 g Oral Daily  . [MAR Hold] rosuvastatin  20 mg Oral QHS  . sodium chloride flush  3 mL Intravenous Q12H  . [MAR Hold] tamsulosin  0.4 mg Oral QPC supper  . [MAR Hold] thiamine  100 mg Oral Daily   Or  . [MAR Hold] thiamine  100 mg Intravenous Daily   Continuous Infusions: . sodium chloride    . sodium chloride Stopped (03/05/20 1124)  . sodium chloride 75 mL/hr at 03/05/20 1124   PRN Meds: sodium chloride, [MAR Hold] acetaminophen, [MAR Hold] docusate sodium, [MAR Hold] HYDROcodone-acetaminophen, [MAR Hold] ipratropium-albuterol, [MAR Hold] LORazepam **OR** [MAR Hold] LORazepam, [MAR Hold]  morphine injection, [MAR Hold] ondansetron (ZOFRAN) IV,  [MAR Hold] polyethylene glycol, [MAR Hold] sodium chloride, sodium chloride flush   Vital Signs    Vitals:   03/05/20 1055 03/05/20 1100 03/05/20 1105 03/05/20 1110  BP: 131/73 (!) 144/73 137/78 128/75  Pulse: 88 88 87 88  Resp:  16 17 20   Temp:      TempSrc:      SpO2: 98% 99% 99% 100%  Weight:      Height:        Intake/Output Summary (Last 24 hours) at 03/05/2020 1129 Last data filed at 03/04/2020 2300 Gross per 24 hour  Intake 846.48 ml  Output 1600 ml  Net -753.52 ml   Last 3 Weights 03/05/2020 03/04/2020 03/02/2020  Weight (lbs) 202 lb 1.6 oz 208 lb 15.9 oz 225 lb  Weight (kg) 91.672 kg 94.8 kg 102.059 kg  Some encounter information is confidential and restricted. Go to Review Flowsheets activity to see all data.     Telemetry    NSR/ST - Personally Reviewed  Physical Exam   GEN: No acute distress.  HEENT: Normocephalic, atraumatic, sclera non-icteric. Neck: No JVD or bruits. Cardiac: RRR no murmurs, rubs, or gallops.  Radials/DP/PT 1+ and equal bilaterally.  Respiratory: Coarse BS to auscultation bilaterally. Breathing is unlabored. GI: Soft, nontender, non-distended, BS +x 4. MS: no deformity. Extremities: No clubbing or cyanosis. No edema. Distal pedal pulses are 2+ and equal bilaterally. Neuro:  AAOx3. Follows commands. Psych:  Responds to questions appropriately with a normal affect.  Labs    High Sensitivity Troponin:   Recent Labs  Lab 03/02/20 0058 03/02/20 0358 03/02/20 1024 03/02/20 1442  TROPONINIHS 23* 102* 463* 311*      Cardiac EnzymesNo results for input(s): TROPONINI in the last 168 hours. No results for input(s): TROPIPOC in the last 168 hours.   Chemistry Recent Labs  Lab 03/02/20 0058 03/02/20 0358 03/03/20 0234 03/04/20 1012 03/05/20 0221  NA 131*   < > 132* 130* 131*  K 3.9   < > 3.9 3.8 3.8  CL 96*   < > 96* 95* 93*  CO2 22   < > 25 25 26   GLUCOSE 476*   < > 180* 164* 175*  BUN 15   < > 18 12 15   CREATININE 1.55*   < >  1.33* 0.97 0.95  CALCIUM 8.7*   < > 8.2* 8.7* 8.8*  PROT 6.4*  --   --   --   --   ALBUMIN 3.2*  --   --   --   --   AST 138*  --   --   --   --   ALT 99*  --   --   --   --   ALKPHOS 80  --   --   --   --   BILITOT 0.5  --   --   --   --   GFRNONAA 50*   < > >60 >60 >60  ANIONGAP 13   < > 11 10 12    < > = values in this interval not displayed.     Hematology Recent Labs  Lab 03/02/20 0058 03/02/20 0433 03/03/20 0234 03/04/20 1336  WBC 9.6  --  11.9* 5.9  RBC 5.16  --  4.42 4.57  HGB 15.7 14.6 13.0 13.6  HCT 45.4 43.0 39.8 39.4  MCV 88.0  --  90.0 86.2  MCH 30.4  --  29.4 29.8  MCHC 34.6  --  32.7 34.5  RDW 13.2  --  13.2 12.8  PLT 244  --  167 172    BNP Recent Labs  Lab 03/04/20 1336  BNP 52.4     DDimer No results for input(s): DDIMER in the last 168 hours.   Radiology    DG CHEST PORT 1 VIEW  Result Date: 03/03/2020 CLINICAL DATA:  Dyspnea. EXAM: PORTABLE CHEST 1 VIEW COMPARISON:  Radiograph yesterday.  Chest CT 09/09/2017 FINDINGS: Interval extubation and removal of enteric tube. Lower lung volumes. Cardiomegaly is grossly stable. Bronchovascular crowding versus vascular congestion. Patchy bibasilar opacities may represent atelectasis, new. No pneumothorax or pleural effusion. Right apical scarring. Advanced degenerative change of the left shoulder. IMPRESSION: 1. Lower lung volumes after extubation. Stable cardiomegaly. Bronchovascular crowding versus vascular congestion. 2. Patchy bibasilar opacities may represent atelectasis. Electronically Signed   By: Narda RutherfordMelanie  Sanford M.D.   On: 03/03/2020 23:36    Cardiac Studies   2D echo 03/02/20  1. Left ventricular ejection fraction, by estimation, is 45 to 50%. The  left ventricle has mildly decreased function. The left ventricle  demonstrates global hypokinesis. There is mild left ventricular  hypertrophy. Left ventricular diastolic parameters  are consistent with Grade I diastolic dysfunction (impaired relaxation).   2. Right ventricular systolic function is mildly reduced. The right  ventricular size is normal. Tricuspid regurgitation signal is inadequate  for assessing PA pressure.  3. The mitral valve is normal in structure. No evidence of  mitral valve  regurgitation. No evidence of mitral stenosis.  4. The aortic valve is tricuspid. Aortic valve regurgitation is not  visualized. No aortic stenosis is present.  5. Aortic dilatation noted. There is mild dilatation of the aortic root,  measuring 42 mm.  6. The inferior vena cava is normal in size with greater than 50%  respiratory variability, suggesting right atrial pressure of 3 mmHg.   Patient Profile     65 y.o. male with CAD s/p CABG 1992 at [facility] without cardiac follow-up recently, CVA, HTN, DM2, CKD stage II-III, tobacco use, depression, aortic root and abdominal aneurysm (followed by [facility]), and history of illicit drug use (UDS + amphetamines, benzos, THC) admitted with cardiac vs respiratory arrest. CPR initiated, agonal breathing on arrival to ER, ROSC achieved without shock or ACLS. 2D echo showed mild LV dysfunction and workup showed elevated troponins. Pt denied use of amphetamines for several months.  Assessment & Plan    1. Out of hospital cardiac vs respiratory arrest with acute hypoxic respiratory failure - pt did not recall events leading to event - had CPR but no shock or ACLS meds per notes - agree with ED that the fact that spontaneous circulation was achieved without medication or electricity suggests that he never truly lost circulation - 2D echo with EF 45-50%, mildly reduced RV function, no pulmonary HTN. Do not see exclusion of PE this admission but based on echo report PE seems less likely but will ask MD to weigh in. Remains concerning to me that he remains on O2 and has been having mild hemoptysis (normal CBC this admission) - s/p cath as below  2. CAD s/p CABG with possible NSTEMI - s/p cath today, ordered by Dr. Tenny Craw.  Per Dr. Okey Dupre, culprit looks to be distal RCA where there was a 99% lesion distal to the SVG - also moderate-severe lesions in the Cx. S/p PCI to dRCA with residual disease to be treated medically - was placed on Brilinta post-cath and is on ASA/BB - there is mention of being on Xarelto 10mg  daily prior to admission but patient reports someone took him off this about 6 months ago (reportedly on for stroke, not specifically a cardiac reason otherwise) - mild hemoptysis also noted this admission so need to follow this closely - statin initiated this admission - recommend to check lipids in AM (LDL 204 in 2017) along with LFTs (abnormal on admission)  3. Cardiomyopathy, possibly ischemic - EF 45-50% by echo - continue BB - consider add ARB if BP becomes an issue  4. Essential HTN - BP relatively controlled, follow in context of the above  5. Acute kidney injury - admit Cr 1.55, improved to 0.95 today  6. H/o polysubstance use - UDS as above; patient denied use of amphetamines for several months although admit notes do report pt was found with some sort of white powder - cessation advised  7. Aortic root and abdominal aneurysm - 41mm dilation of aortic root by echo this admission - recommend continue to f/u [facility]  For questions or updates, please contact [facility] Please consult [email or web address] for contact info under Cardiology/STEMI.  Signed, 61m, PA-C 03/05/2020, 11:29 AM    Attending Note:   The patient was seen and examined.  Agree with assessment and plan as noted above.  Changes made to the above note as needed.  Patient seen and independently examined with  05/03/2020, PA .   We discussed all aspects of the encounter.  I agree with the assessment and plan as stated above.  1.   CAD :   S/p stenting of his distal RCA .  He has a patent LIMA to his LAD and diffuse disease of his LCX.  I do no think his CAD is severe enough to explain his cardiorespiratory arrest  2.   Cardiorespiratory arrest:   Most likely related to his polysubstance abuse .    3.  Aortic dilatation:   ao root is 42 mm .  Will follow as OK      I have spent a total of 40 minutes with patient reviewing hospital  notes , telemetry, EKGs, labs and examining patient as well as establishing an assessment and plan that was discussed with the patient. > 50% of time was spent in direct patient care.    Vesta Mixer, Montez Hageman., MD, [facility] 03/05/2020, 2:40 PM [address],  [address] Office [phone] Pager [phone]

## 2020-03-05 NOTE — Interval H&P Note (Signed)
History and Physical Interval Note:  03/05/2020 9:06 AM  [name]  has presented today for surgery, with the diagnosis of NSTEMI.  The various methods of treatment have been discussed with the patient and family. After consideration of risks, benefits and other options for treatment, the patient has consented to  Procedure(s): LEFT HEART CATH AND CORS/GRAFTS ANGIOGRAPHY (N/A) as a surgical intervention.  The patient's history has been reviewed, patient examined, no change in status, stable for surgery.  I have reviewed the patient's chart and labs.  Questions were answered to the patient's satisfaction.    Cath Lab Visit (complete for each Cath Lab visit)  Clinical Evaluation Leading to the Procedure:   ACS: Yes.    Non-ACS:  N/A  Haleem Hanner

## 2020-03-05 NOTE — Plan of Care (Signed)

## 2020-03-05 NOTE — Brief Op Note (Signed)
BRIEF CARDIAC CATHETERIZATION NOTE  DATE: 03/05/2020  TIME: 10:24 AM  PATIENT:  Peggyann Juba  65 y.o. male  PRE-OPERATIVE DIAGNOSIS:  NSTEMI  POST-OPERATIVE DIAGNOSIS:  NSTEMI  PROCEDURE:  Procedure(s): LEFT HEART CATH AND CORS/GRAFTS ANGIOGRAPHY (N/A) CORONARY STENT INTERVENTION (N/A)  SURGEON:  Surgeon(s) and Role:    * Detroit Frieden, Cristal Deer, MD - Primary  FINDINGS: 1. Severe 3-vessel CAD including CTO of ostial LAD, multifocal LCx/OM disease of up to 90%, and sequential 100% ostial and 99% distal RCA stenoses. 2. Widely patent LIMA-LAD and SVG-dRCA (99% dRCA stenosis lies beyond SVG anastomosis). 3. Normal LVEDP. 4. Successful PCI to distal RCA through SVG-dRCA using Resolute Onyx 2.5 x 12 mm DES.  RECOMMENDATIONS: 1. Remove right femoral artery sheath 2 hours after discontinuation of bivalirudin. 2. DAPT with aspirin and ticagrelor for at least 12 months. 3. Favor medical therapy of LCx/OM disease, given small vessel size and multifocal nature unless the patient has refractory sx. 4. Aggressive secondary prevention.  Yvonne Kendall, MD [facility]

## 2020-03-05 NOTE — TOC Initial Note (Signed)
Transition of Care (TOC) - Initial/Assessment Note  Sander Radon, BSN Transitions of Care Unit 4E- RN Case Manager See Treatment Team for direct phone # Cross coverage for 6E   Patient Details  Name: [name] MRN: [id number] Date of Birth: 10-Apr-1955  Transition of Care [facility]) CM/SW Contact:    Darrold Span, RN Phone Number: 03/05/2020, 5:28 PM  Clinical Narrative:                 Pt for cardiac cath today, noted orders placed for HHPT/OT- CM spoke with pt at bedside- discussed [facility] recommendations- pt agreeable to services- per pt he thinks he has HH vs PCS services with [facility]- call made to [facility] to check on active status- per Morrie Sheldon with [facility]- pt is not active with [facility] services at this time- Pt offered [facility] choice Per CMS guidelines from medicare.gov website with star ratings (copy placed in shadow chart)- no preference after [facility]- and defers to [facility] to secure agency- call made to [facility] with [facility] regarding [facility] referral- referral has been accepted.   Expected Discharge Plan: Home w Home Health Services Barriers to Discharge: Continued Medical Work up   Patient Goals and CMS Choice Patient states their goals for this hospitalization and ongoing recovery are:: return home CMS Medicare.gov Compare Post Acute Care list provided to:: Patient Choice offered to / list presented to : Patient  Expected Discharge Plan and Services Expected Discharge Plan: Home w Home Health Services   Discharge Planning Services: CM Consult Post Acute Care Choice: Home Health Living arrangements for the past 2 months: Apartment                           HH Arranged: PT,OT HH Agency: [facility] Date [facility] Agency Contacted: 03/05/20 Time HH Agency Contacted: 1500 Representative spoke with at [facility] Agency: Kandee Keen  Prior Living Arrangements/Services Living arrangements for the past 2 months: Apartment Lives with:: Self Patient language and need for interpreter  reviewed:: Yes Do you feel safe going back to the place where you live?: Yes      Need for Family Participation in Patient Care: Yes (Comment) Care giver support system in place?: Yes (comment) Current home services: DME Criminal Activity/Legal Involvement Pertinent to Current Situation/Hospitalization: No - Comment as needed  Activities of Daily Living      Permission Sought/Granted Permission sought to share information with : Oceanographer granted to share information with : Yes, Verbal Permission Granted     Permission granted to share info w AGENCY: HH        Emotional Assessment Appearance:: Appears stated age Attitude/Demeanor/Rapport: Engaged Affect (typically observed): Appropriate Orientation: : Oriented to Self,Oriented to Place,Oriented to  Time,Oriented to Situation Alcohol / Substance Use: Not Applicable Psych Involvement: No (comment)  Admission diagnosis:  Cardiac arrest (HCC) [I46.9] Hyponatremia [E87.1] Hyperglycemia [R73.9] Renal insufficiency [N28.9] Acute respiratory failure with hypoxia (HCC) [J96.01] Acute encephalopathy [G93.40] Patient Active Problem List   Diagnosis Date Noted  . Non-ST elevation (NSTEMI) myocardial infarction (HCC)   . Coronary artery disease involving native coronary artery of native heart without angina pectoris   . Cardiac arrest (HCC) 03/02/2020  . Severe episode of recurrent major depressive disorder, without psychotic features (HCC)   . Opioid use disorder, severe, in sustained remission (HCC) 05/25/2015  . Benzodiazepine abuse (HCC) 05/25/2015  . Cannabis use disorder, moderate, dependence (HCC) 05/25/2015  . MDD (major depressive disorder),  recurrent episode, severe (HCC) 05/23/2015  . Chronic pain syndrome 05/23/2015  . GAD (generalized anxiety disorder) 05/23/2015  . CVA (cerebral infarction) 10/15/2013  . Hypertension 10/15/2013  . Diabetes mellitus (HCC) 10/15/2013   PCP:  Dema Severin, NP Pharmacy:   [facility] - [place], [place] - [address] [place] [address] Phone: [phone] Fax: [phone]     Social Determinants of Health (SDOH) Interventions    Readmission Risk Interventions No flowsheet data found.

## 2020-03-05 NOTE — Progress Notes (Signed)
PROGRESS NOTE  [name] MVH:[id number] DOB: 05/27/1955 DOA: 03/02/2020 PCP: Dema Severin, NP  HPI/Recap of past 24 hours: 65 yo with past medical history significant for essential hypertension, type 2 diabetes, prior CVA and chronic depression who was brought in by ems after witnessed arrest. Pt was with family when he "suddenly locked up" and fell down. cpr was initiated immediately. When ems arrives pt was agonal with respirations but strong carotid pulse. King airway was placed along with IO in [place] and he was transported to [facility].  There was a report of a white powder substance near the patient where he was found down.  Patient achieved ROSC without shock or ACLS medications per EMS.  He was unresponsive and sedated on vent.  Admitted to the ICU.  Extubated to nasal cannula on 03/02/2020.  Transferred to [facility] service on 03/03/2020.  Obstacles complicated by mild hemoptysis in the setting of full dose Lovenox twice daily.  Hemoglobin has remained stable.  03/05/20: Patient was seen and examined at his bedside.  This morning he continues to complain of chest pain at the site of surgical repair.  Was taken to cardiac cath today which showed 99% lesion distal to the SVG, moderate to severe lesions in the Cx, status post PCI to distal RCA with residual disease to be treated medically.   Assessment/Plan: Active Problems:   Cardiac arrest (HCC)   Non-ST elevation (NSTEMI) myocardial infarction (HCC)    Acute hypoxic and hypercarbic respiratory failure suspect secondary to atelectasis Initially intubated due to cardiac arrest and unresponsiveness Extubated on 03/02/2020 He is currently requiring 5 L to maintain O2 saturation greater than 90% Wean off oxygen supplementation as tolerated Personally reviewed chest x-ray done on 03/02/2020 showing atelectasis at the bases bilaterally.  No evidence of pulmonary edema or lobular infiltrates. New onset hemoptysis and increase in oxygen demand,  monitor on full dose lovenox  Coronary artery disease status post CABG with possible NSTEMI Status post cardiac cath on 03/05/2020 which showed 99% lesion distal to the SVG, moderate to severe lesions in the Cx, status post PCI to distal RCA with residual disease to be treated medically. Management per cardiology  New onset hemoptysis in the setting of full dose lovenox with recent intubation/extubation Hemoglobin has remained stable, uptrending to 13.6. He is on aspirin and xarelto PTA due to hx of cva Currently on full dose Lovenox 100 mg twice daily.  History of CVA Was started on full dose lovenox while in the ICU Restart home Xarelto for secondary CVA prevention when ok with cardiology He is also on ASA 81 mg daily  Elevated troponin, possibly NSTEMI Presented after cardiac arrest Troponin has peaked at 463 and trended down. 2D echo with reduced LVEF 45-50% Continue aspirin, Coreg 3.125 mg twice daily, Crestor 20 mg daily Reports chest discomfort this morning at the site of his chest compressions. Continue close monitoring Cardiology following   Cardiac arrest, unknown etiology /chest pain/pleuritic pain was seen by cardiology 2D echo done on 03/02/2020 revealing LVEF 45 to 50% with left ventricle global hypokinesis, grade 1 diastolic dysfunction IV morphine as needed for severe pain  Resolved AKI Baseline creatinine appears to be 0.8 with GFR 26 Presented with creatinine of 1.55 with GFR of 50 Creatinine 0.95 with GFR greater than 60, back to his baseline Continue to avoid nephrotoxins, hypotension Renal panel in the morning  Mild improvement hypervolemic hyponatremia Serum sodium 130> 131. Continue to monitor Repeat renal panel in the morning.  Acute on chronic combined diastolic and systolic CHF BNP 52. Last 2D echo with findings as stated above, LVEF reduced from prior Continue Coreg, medications as recommended by cardiology Closely monitor volume status Continue  strict I's and O's and daily weights  Type 2 diabetes with hyperglycemia Hemoglobin A1c 11.3 on 03/02/2020 Continue insulin sliding scale code   Acute toxic metabolic encephalopathy  - resolved  --UDS +polysubstance  --CT head neg   Polysubstance abuse UDS + amphetamins, benzo and THC CIWA protocol for history of benzodiazepine abuse Polysubstance abuse counseling at bedside    Code Status: Full code  Family Communication: None at bedside.  Disposition Plan: Likely will DC to home with home health services   Consultants:  Cardiology  PCCM  Procedures:  Intubation on admission  Extubation on 03/02/2020  Antimicrobials:  None.  DVT prophylaxis: Subcu full dose Lovenox BID  Status is: Inpatient    Dispo:  Patient From: Home  Planned Disposition: Home with Health Care Svc  Expected discharge date: 03/06/2020  Medically stable for discharge: No, ongoing management of acute hypoxic hypercarbic respiratory failure.         Objective: Vitals:   03/05/20 1205 03/05/20 1210 03/05/20 1215 03/05/20 1220  BP: 136/80 (!) 142/82 134/75 133/81  Pulse: 80 84 82 83  Resp: (!) 27 (!) 35 (!) 23 (!) 21  Temp:      TempSrc:      SpO2: 100% 100% 98% 100%  Weight:      Height:        Intake/Output Summary (Last 24 hours) at 03/05/2020 1230 Last data filed at 03/04/2020 2300 Gross per 24 hour  Intake 606.48 ml  Output 1100 ml  Net -493.52 ml   Filed Weights   03/02/20 0306 03/04/20 0515 03/05/20 0435  Weight: 102.1 kg 94.8 kg 91.7 kg    Exam:  . General: 65 y.o. year-old male well-developed well-nourished no acute distress.  Alert and oriented x3.  Appears uncomfortable due to centrally located chest pain. . Cardiovascular: Regular rate and rhythm no rubs or gallops.  Marland Kitchen Respiratory: Mild rales at bases no wheezing noted.  Poor inspiratory effort.   . Abdomen: Soft with normal bowel sounds noted.   . Musculoskeletal: Trace lower extremity edema bilaterally.    . Skin: No ulcerative lesion.    Marland Kitchen Psychiatry: Mood is appropriate for condition and setting.   Data Reviewed: CBC: Recent Labs  Lab 03/02/20 0058 03/02/20 0433 03/03/20 0234 03/04/20 1336  WBC 9.6  --  11.9* 5.9  NEUTROABS 7.1  --   --   --   HGB 15.7 14.6 13.0 13.6  HCT 45.4 43.0 39.8 39.4  MCV 88.0  --  90.0 86.2  PLT 244  --  167 172   Basic Metabolic Panel: Recent Labs  Lab 03/02/20 0058 03/02/20 0358 03/02/20 0433 03/03/20 0234 03/04/20 1012 03/04/20 1336 03/05/20 0221  NA 131* 131* 130* 132* 130*  --  131*  K 3.9 4.7 4.4 3.9 3.8  --  3.8  CL 96* 97*  --  96* 95*  --  93*  CO2 22 20*  --  25 25  --  26  GLUCOSE 476* 383*  --  180* 164*  --  175*  BUN 15 14  --  18 12  --  15  CREATININE 1.55* 1.31*  --  1.33* 0.97  --  0.95  CALCIUM 8.7* 9.0  --  8.2* 8.7*  --  8.8*  MG  --  2.0  --   --   --  1.7  --   PHOS  --  4.8*  --   --   --  3.3  --    GFR: Estimated Creatinine Clearance: 88.6 mL/min (by C-G formula based on SCr of 0.95 mg/dL). Liver Function Tests: Recent Labs  Lab 03/02/20 0058  AST 138*  ALT 99*  ALKPHOS 80  BILITOT 0.5  PROT 6.4*  ALBUMIN 3.2*   No results for input(s): LIPASE, AMYLASE in the last 168 hours. No results for input(s): AMMONIA in the last 168 hours. Coagulation Profile: No results for input(s): INR, PROTIME in the last 168 hours. Cardiac Enzymes: No results for input(s): CKTOTAL, CKMB, CKMBINDEX, TROPONINI in the last 168 hours. BNP (last 3 results) No results for input(s): PROBNP in the last 8760 hours. HbA1C: No results for input(s): HGBA1C in the last 72 hours. CBG: Recent Labs  Lab 03/04/20 1651 03/04/20 2141 03/05/20 0434 03/05/20 0838 03/05/20 1104  GLUCAP 152* 178* 140* 173* 159*   Lipid Profile: No results for input(s): CHOL, HDL, LDLCALC, TRIG, CHOLHDL, LDLDIRECT in the last 72 hours. Thyroid Function Tests: No results for input(s): TSH, T4TOTAL, FREET4, T3FREE, THYROIDAB in the last 72  hours. Anemia Panel: No results for input(s): VITAMINB12, FOLATE, FERRITIN, TIBC, IRON, RETICCTPCT in the last 72 hours. Urine analysis:    Component Value Date/Time   COLORURINE YELLOW 03/02/2020 0355   APPEARANCEUR CLEAR 03/02/2020 0355   LABSPEC 1.032 (H) 03/02/2020 0355   PHURINE 6.0 03/02/2020 0355   GLUCOSEU >=500 (A) 03/02/2020 0355   HGBUR NEGATIVE 03/02/2020 0355   BILIRUBINUR NEGATIVE 03/02/2020 0355   KETONESUR NEGATIVE 03/02/2020 0355   PROTEINUR NEGATIVE 03/02/2020 0355   UROBILINOGEN 0.2 10/16/2013 0837   NITRITE NEGATIVE 03/02/2020 0355   LEUKOCYTESUR NEGATIVE 03/02/2020 0355   Sepsis Labs: @LABRCNTIP (procalcitonin:4,lacticidven:4)  ) Recent Results (from the past 240 hour(s))  Resp Panel by RT-PCR (Flu A&B, Covid) Nasopharyngeal Swab     Status: None   Collection Time: 03/02/20 12:59 AM   Specimen: Nasopharyngeal Swab; Nasopharyngeal(NP) swabs in vial transport medium  Result Value Ref Range Status   SARS Coronavirus 2 by RT PCR NEGATIVE NEGATIVE Final    Comment: (NOTE) SARS-CoV-2 target nucleic acids are NOT DETECTED.  The SARS-CoV-2 RNA is generally detectable in upper respiratory specimens during the acute phase of infection. The lowest concentration of SARS-CoV-2 viral copies this assay can detect is 138 copies/mL. A negative result does not preclude SARS-Cov-2 infection and should not be used as the sole basis for treatment or other patient management decisions. A negative result may occur with  improper specimen collection/handling, submission of specimen other than nasopharyngeal swab, presence of viral mutation(s) within the areas targeted by this assay, and inadequate number of viral copies(<138 copies/mL). A negative result must be combined with clinical observations, patient history, and epidemiological information. The expected result is Negative.  Fact Sheet for Patients:  BloggerCourse.comhttps://www.fda.gov/media/152166/download  Fact Sheet for  Healthcare Providers:  SeriousBroker.ithttps://www.fda.gov/media/152162/download  This test is no t yet approved or cleared by the [place] FDA and  has been authorized for detection and/or diagnosis of SARS-CoV-2 by FDA under an Emergency Use Authorization (EUA). This EUA will remain  in effect (meaning this test can be used) for the duration of the COVID-19 declaration under Section 564(b)(1) of the Act, 21 U.S.C.section 360bbb-3(b)(1), unless the authorization is terminated  or revoked sooner.       Influenza A by PCR NEGATIVE NEGATIVE Final  Influenza B by PCR NEGATIVE NEGATIVE Final    Comment: (NOTE) The Xpert Xpress SARS-CoV-2/FLU/RSV plus assay is intended as an aid in the diagnosis of influenza from Nasopharyngeal swab specimens and should not be used as a sole basis for treatment. Nasal washings and aspirates are unacceptable for Xpert Xpress SARS-CoV-2/FLU/RSV testing.  Fact Sheet for Patients: BloggerCourse.com  Fact Sheet for Healthcare Providers: SeriousBroker.it  This test is not yet approved or cleared by the [place] FDA and has been authorized for detection and/or diagnosis of SARS-CoV-2 by FDA under an Emergency Use Authorization (EUA). This EUA will remain in effect (meaning this test can be used) for the duration of the COVID-19 declaration under Section 564(b)(1) of the Act, 21 U.S.C. section 360bbb-3(b)(1), unless the authorization is terminated or revoked.  Performed at [facility], [address] [place], [place] [address]       Studies: No results found.  Scheduled Meds: . [MAR Hold] aspirin  81 mg Oral Daily  . [MAR Hold] carvedilol  3.125 mg Oral BID WC  . [MAR Hold] docusate sodium  100 mg Oral BID  . [MAR Hold] enoxaparin (LOVENOX) injection  100 mg Subcutaneous Q12H  . [MAR Hold] famotidine  20 mg Oral BID  . [MAR Hold] folic acid  1 mg Oral Daily  . [MAR Hold]  HYDROmorphone  (DILAUDID) injection  0.5 mg Intravenous Once  . [MAR Hold] insulin aspart  0-15 Units Subcutaneous Q4H  . [MAR Hold] lidocaine  1 application Urethral Once  . [MAR Hold] multivitamin with minerals  1 tablet Oral Daily  . [MAR Hold] nicotine  21 mg Transdermal Daily  . [MAR Hold] polyethylene glycol  17 g Oral Daily  . [MAR Hold] rosuvastatin  20 mg Oral QHS  . sodium chloride flush  3 mL Intravenous Q12H  . [MAR Hold] tamsulosin  0.4 mg Oral QPC supper  . [MAR Hold] thiamine  100 mg Oral Daily   Or  . [MAR Hold] thiamine  100 mg Intravenous Daily    Continuous Infusions: . sodium chloride    . sodium chloride Stopped (03/05/20 1124)  . sodium chloride 75 mL/hr at 03/05/20 1124     LOS: 3 days     Darlin Drop, MD [facility] Pager [phone]  If 7PM-7AM, please contact night-coverage [email or web address] Password [email or web address] 03/05/2020, 12:30 PM

## 2020-03-06 ENCOUNTER — Other Ambulatory Visit: Payer: Self-pay

## 2020-03-06 ENCOUNTER — Encounter (HOSPITAL_COMMUNITY): Payer: Self-pay | Admitting: Pulmonary Disease

## 2020-03-06 ENCOUNTER — Inpatient Hospital Stay (HOSPITAL_COMMUNITY): Payer: Medicare Other

## 2020-03-06 DIAGNOSIS — I469 Cardiac arrest, cause unspecified: Secondary | ICD-10-CM | POA: Diagnosis not present

## 2020-03-06 DIAGNOSIS — I2510 Atherosclerotic heart disease of native coronary artery without angina pectoris: Secondary | ICD-10-CM | POA: Diagnosis not present

## 2020-03-06 DIAGNOSIS — I2699 Other pulmonary embolism without acute cor pulmonale: Secondary | ICD-10-CM | POA: Diagnosis not present

## 2020-03-06 LAB — BASIC METABOLIC PANEL
Anion gap: 12 (ref 5–15)
BUN: 13 mg/dL (ref 8–23)
CO2: 27 mmol/L (ref 22–32)
Calcium: 8.7 mg/dL — ABNORMAL LOW (ref 8.9–10.3)
Chloride: 95 mmol/L — ABNORMAL LOW (ref 98–111)
Creatinine, Ser: 1.02 mg/dL (ref 0.61–1.24)
GFR, Estimated: >60 mL/min (ref >60)
Glucose, Bld: 157 mg/dL — ABNORMAL HIGH (ref 70–99)
Potassium: 4 mmol/L (ref 3.5–5.1)
Sodium: 134 mmol/L — ABNORMAL LOW (ref 135–145)

## 2020-03-06 LAB — LIPID PANEL
Cholesterol: 131 mg/dL (ref 0–200)
HDL: 27 mg/dL — ABNORMAL LOW (ref >40)
LDL Cholesterol: 81 mg/dL (ref 0–99)
Total CHOL/HDL Ratio: 4.9 RATIO
Triglycerides: 114 mg/dL (ref <150)
VLDL: 23 mg/dL (ref 0–40)

## 2020-03-06 LAB — CBC
HCT: 34.8 % — ABNORMAL LOW (ref 39.0–52.0)
Hemoglobin: 12.3 g/dL — ABNORMAL LOW (ref 13.0–17.0)
MCH: 30.3 pg (ref 26.0–34.0)
MCHC: 35.3 g/dL (ref 30.0–36.0)
MCV: 85.7 fL (ref 80.0–100.0)
Platelets: 218 10*3/uL (ref 150–400)
RBC: 4.06 MIL/uL — ABNORMAL LOW (ref 4.22–5.81)
RDW: 12.8 % (ref 11.5–15.5)
WBC: 4.7 10*3/uL (ref 4.0–10.5)
nRBC: 0 % (ref 0.0–0.2)

## 2020-03-06 LAB — GLUCOSE, CAPILLARY
Glucose-Capillary: 176 mg/dL — ABNORMAL HIGH (ref 70–99)
Glucose-Capillary: 101 mg/dL — ABNORMAL HIGH (ref 70–99)
Glucose-Capillary: 152 mg/dL — ABNORMAL HIGH (ref 70–99)
Glucose-Capillary: 204 mg/dL — ABNORMAL HIGH (ref 70–99)
Glucose-Capillary: 244 mg/dL — ABNORMAL HIGH (ref 70–99)
Glucose-Capillary: 161 mg/dL — ABNORMAL HIGH (ref 70–99)

## 2020-03-06 LAB — HEPATIC FUNCTION PANEL
ALT: 20 U/L (ref 0–44)
AST: 16 U/L (ref 15–41)
Albumin: 2.4 g/dL — ABNORMAL LOW (ref 3.5–5.0)
Alkaline Phosphatase: 62 U/L (ref 38–126)
Bilirubin, Direct: <0.1 mg/dL (ref 0.0–0.2)
Total Bilirubin: 0.2 mg/dL — ABNORMAL LOW (ref 0.3–1.2)
Total Protein: 6 g/dL — ABNORMAL LOW (ref 6.5–8.1)

## 2020-03-06 LAB — HEPARIN LEVEL (UNFRACTIONATED): Heparin Unfractionated: 0.25 IU/mL — ABNORMAL LOW (ref 0.30–0.70)

## 2020-03-06 MED ORDER — HEPARIN (PORCINE) 25000 UT/250ML-% IV SOLN
2000.0000 [IU]/h | INTRAVENOUS | Status: DC
  Administered 2020-03-06: 22:00:00 1600 [IU]/h via INTRAVENOUS
  Administered 2020-03-06: 14:00:00 1400 [IU]/h via INTRAVENOUS
  Administered 2020-03-07: 2000 [IU]/h via INTRAVENOUS
  Administered 2020-03-07: 1900 [IU]/h via INTRAVENOUS
  Administered 2020-03-08: 2000 [IU]/h via INTRAVENOUS
  Filled 2020-03-06 (×6): qty 250

## 2020-03-06 MED ORDER — CLOPIDOGREL BISULFATE 75 MG PO TABS
75.0000 mg | ORAL_TABLET | Freq: Every day | ORAL | Status: DC
  Administered 2020-03-07 – 2020-03-09 (×3): 75 mg via ORAL
  Filled 2020-03-06 (×3): qty 1

## 2020-03-06 MED ORDER — CLOPIDOGREL BISULFATE 75 MG PO TABS
300.0000 mg | ORAL_TABLET | Freq: Once | ORAL | Status: AC
  Administered 2020-03-06: 300 mg via ORAL
  Filled 2020-03-06: qty 4

## 2020-03-06 MED ORDER — IOHEXOL 350 MG/ML SOLN
75.0000 mL | Freq: Once | INTRAVENOUS | Status: AC | PRN
  Administered 2020-03-06: 57 mL via INTRAVENOUS

## 2020-03-06 MED ORDER — SODIUM CHLORIDE 0.9 % IV SOLN: INTRAVENOUS | Status: DC

## 2020-03-06 NOTE — Progress Notes (Addendum)
PROGRESS NOTE  [name] TJQ:[id number] DOB: Jan 05, 1956 DOA: 03/02/2020 PCP: Dema Severin, NP  HPI/Recap of past 24 hours: 65 yo with past medical history significant for essential hypertension, type 2 diabetes, prior CVA and chronic depression who was brought in by ems after witnessed arrest. Pt was with family when he "suddenly locked up" and fell down. cpr was initiated immediately. When ems arrives pt was agonal with respirations but strong carotid pulse. King airway was placed along with IO in [place] and he was transported to [facility].  There was a report of a white powder substance near the patient where he was found down.  Patient achieved ROSC without shock or ACLS medications per EMS.  He was unresponsive and sedated on vent.  Admitted to the ICU.  Extubated to nasal cannula on 03/02/2020.  Transferred to [facility] service on 03/03/2020.  Status post heart cath on 03/05/2020 by Dr. Okey Dupre which showed 99% lesion distal to the SVG, also moderate-severe lesions in the circumflex.  Status post PCI to distal RCA with residual disease to be treated medically.  He is currently on aspirin 81 mg daily, Brilinta 90 mg twice daily and Crestor 20 mg daily.  Hospital course complicated by mild hemoptysis in the setting of full dose Lovenox twice daily.  Hemoglobin has remained stable.  CTA PE done on 03/06/2018 positive right pulmonary embolism.  Started on heparin drip x48 hours.    Patient was reportedly on low-dose Xarelto as outpatient for history of stroke but has not been taking it for quite some time, at least 6 months.    03/06/20: Patient was seen and examined at his bedside.  He continues to have right-sided chest pain.  CTA PE positive for right-sided pulmonary embolism.  Started on heparin drip.  Assessment/Plan: Active Problems:   Cardiac arrest (HCC)   Non-ST elevation (NSTEMI) myocardial infarction [facility])   Coronary artery disease involving native coronary artery of native heart without  angina pectoris   Acute pulmonary embolism without acute cor pulmonale (HCC)    Acute hypoxic and hypercarbic respiratory failure secondary to right-sided pulmonary embolism diagnosed on 03/06/2020. Initially intubated due to cardiac arrest and unresponsiveness Extubated on 03/02/2020 He is currently requiring 3 L to maintain O2 saturation greater than 90% Wean off oxygen supplementation as tolerated Personally reviewed chest x-ray done on 03/02/2020 showing atelectasis at the bases bilaterally.  No evidence of pulmonary edema or lobular infiltrates. New onset hemoptysis and increase in oxygen demand, was on full dose subcu Lovenox until 03/05/2020.  CTA PE positive for pulmonary embolism, right-sided, no evidence of right heart strain on CT.  Started on heparin drip x48 hours, then switch to Eliquis.    Newly diagnosed pulmonary embolism, suspect present on admission. Monitor hemoptysis, onset 03/04/20, improved after sq lovenox dose reduced to once a day by cardiology post cath.  Monitor H&H. Management as stated above Obtained bilateral lower extremity Doppler ultrasound to rule out DVT to r/o DVT as source for pulmonary embolism, follow results. TOC consult to check for insurance coverage of Eliquis  Coronary artery disease status post CABG with possible NSTEMI Status post cardiac cath on 03/05/2020 which showed 99% lesion distal to the SVG, moderate to severe lesions in the Cx, status post PCI to distal RCA with residual disease to be treated medically. Management per cardiology Aspirin 81 mg daily, Brilinta 90 mg twice daily and Crestor 20 mg daily. Defer to cardiology to adjust antiplatelets in the setting of heparin drip.  History of CVA Was started on full dose lovenox while in the ICU, reduced to chemical DVT prophylaxis dose by cardiology post heart cath.   Patient was reportedly on low-dose Xarelto as outpatient for history of stroke but has not been taking it for quite some time, at  least 6 months.   He is currently on aspirin, Brilinta, and Crestor, now on heparin drip.  Defer to cardiology to adjust antiplatelets in the setting of anticoagulation.  Elevated troponin, possibly NSTEMI versus PE, suspect present on admission. Presented after cardiac arrest Troponin has peaked at 463 and trended down. 2D echo with reduced LVEF 45-50% Continue aspirin, Coreg 3.125 mg twice daily, Crestor 20 mg daily Continue close monitoring Cardiology following   Cardiac arrest, unknown etiology /chest pain/pleuritic pain/newly diagnosed PE Seen by cardiology 2D echo done on 03/02/2020 revealing LVEF 45 to 50% with left ventricle global hypokinesis, grade 1 diastolic dysfunction IV morphine as needed for severe pain  AKI Baseline creatinine appears to be 0.8 with GFR 26 Presented with creatinine of 1.55 with GFR of 50 Creatinine uptrending 1.02 from 0.95. Continue IV fluid in the setting of recent IV contrast x48 hours. Monitor volume status while on IV fluid Continue to avoid nephrotoxins and hypotension Monitor urine output Daily renal panel.  Improvement hypervolemic hyponatremia Serum sodium 130> 131> 134. Continue to monitor Repeat renal panel in the morning.  Acute on chronic combined diastolic and systolic CHF Last 2D echo with findings as stated above, LVEF reduced from prior Continue Coreg, and other cardiac medications as recommended by cardiology Closely monitor volume status while on IV fluid. Continue strict I's and O's and daily weights Net I&O -754 cc.  Type 2 diabetes with hyperglycemia Hemoglobin A1c 11.3 on 03/02/2020 Continue insulin sliding scale code   Acute toxic metabolic encephalopathy Resolved --UDS + for amphetamine, benzodiazepine, and THC on 03/02/2020. --CT head neg   Polysubstance abuse UDS + amphetamins, benzo and THC CIWA protocol for history of benzodiazepine abuse Polysubstance abuse counseling at bedside No evidence of  benzodiazepine withdrawal at the time of this visit.  Ascending thoracic aorta aneurysm measuring 4.5 x 4.3 cm Slightly enlarged from prior examination in 2019 Ascending thoracic aortic aneurysm. Recommend semi-annual imaging followup by CTA or MRA and referral to cardiothoracic surgery if not already obtained. This recommendation follows 2010 ACCF/AHA/AATS/ACR/ASA/SCA/SCAI/SIR/STS/SVM Guidelines. Provide referral to CTS at discharge for follow up.  Minimally displaced fractures of the anterior left 6th rib, anterior right 2nd and 5th ribs likely due to resuscitation in the setting of cardiac arrest. Pain control Closely monitor      Code Status: Full code  Family Communication: None at bedside.  Disposition Plan: Likely will DC to home with home health services on 03/08/2020 after completing 48 hours of heparin drip then will switch to Eliquis.   Consultants:  Cardiology  PCCM  Procedures:  Intubation on admission  Extubation on 03/02/2020  Antimicrobials:  None.  DVT prophylaxis: Subcu full dose Lovenox BID  Status is: Inpatient    Dispo:  Patient From: Home  Planned Disposition: Home with Health Care Svc  Expected discharge date: 03/08/20  Medically stable for discharge: No, ongoing management of acute hypoxic hypercarbic respiratory failure, and acute pulmonary embolism.         Objective: Vitals:   03/06/20 0728 03/06/20 1200 03/06/20 1614 03/06/20 1732  BP: (!) 155/92 (!) 148/95 (!) 168/84   Pulse: 90 92 96 100  Resp: 19 15 20  (!) 23  Temp: 97.7 F (  36.5 C) 97.7 F (36.5 C) 98.7 F (37.1 C)   TempSrc: Oral Oral Oral   SpO2: 98% 90% 90% 92%  Weight:      Height:        Intake/Output Summary (Last 24 hours) at 03/06/2020 1813 Last data filed at 03/06/2020 0456 Gross per 24 hour  Intake 450 ml  Output 1200 ml  Net -750 ml   Filed Weights   03/04/20 0515 03/05/20 0435 03/06/20 0429  Weight: 94.8 kg 91.7 kg 91.3 kg     Exam:  . General: 65 y.o. year-old male well-developed well-nourished in no acute distress.  Alert and oriented x3.   . Cardiovascular: Regular rate and rhythm no rubs or gallops.  Chest tender with palpation.   Marland Kitchen Respiratory: Clear to auscultation no wheezes or rales.  Poor inspiratory effort.   . Abdomen: Soft normal bowel sounds, nondistended.  Nontender.  . Musculoskeletal: No lower extremity edema bilaterally. . Skin: No ulcerative lesions noted.    Marland Kitchen Psychiatry: Mood is appropriate for condition and setting.   Data Reviewed: CBC: Recent Labs  Lab 03/02/20 0058 03/02/20 0433 03/03/20 0234 03/04/20 1336 03/06/20 0238  WBC 9.6  --  11.9* 5.9 4.7  NEUTROABS 7.1  --   --   --   --   HGB 15.7 14.6 13.0 13.6 12.3*  HCT 45.4 43.0 39.8 39.4 34.8*  MCV 88.0  --  90.0 86.2 85.7  PLT 244  --  167 172 218   Basic Metabolic Panel: Recent Labs  Lab 03/02/20 0358 03/02/20 0433 03/03/20 0234 03/04/20 1012 03/04/20 1336 03/05/20 0221 03/06/20 0238  NA 131* 130* 132* 130*  --  131* 134*  K 4.7 4.4 3.9 3.8  --  3.8 4.0  CL 97*  --  96* 95*  --  93* 95*  CO2 20*  --  25 25  --  26 27  GLUCOSE 383*  --  180* 164*  --  175* 157*  BUN 14  --  18 12  --  15 13  CREATININE 1.31*  --  1.33* 0.97  --  0.95 1.02  CALCIUM 9.0  --  8.2* 8.7*  --  8.8* 8.7*  MG 2.0  --   --   --  1.7  --   --   PHOS 4.8*  --   --   --  3.3  --   --    GFR: Estimated Creatinine Clearance: 82.4 mL/min (by C-G formula based on SCr of 1.02 mg/dL). Liver Function Tests: Recent Labs  Lab 03/02/20 0058 03/06/20 0238  AST 138* 16  ALT 99* 20  ALKPHOS 80 62  BILITOT 0.5 0.2*  PROT 6.4* 6.0*  ALBUMIN 3.2* 2.4*   No results for input(s): LIPASE, AMYLASE in the last 168 hours. No results for input(s): AMMONIA in the last 168 hours. Coagulation Profile: No results for input(s): INR, PROTIME in the last 168 hours. Cardiac Enzymes: No results for input(s): CKTOTAL, CKMB, CKMBINDEX, TROPONINI in the  last 168 hours. BNP (last 3 results) No results for input(s): PROBNP in the last 8760 hours. HbA1C: No results for input(s): HGBA1C in the last 72 hours. CBG: Recent Labs  Lab 03/06/20 0059 03/06/20 0407 03/06/20 0727 03/06/20 1158 03/06/20 1614  GLUCAP 176* 101* 152* 204* 244*   Lipid Profile: Recent Labs    03/06/20 0238  CHOL 131  HDL 27*  LDLCALC 81  TRIG 161  CHOLHDL 4.9   Thyroid Function Tests: No results  for input(s): TSH, T4TOTAL, FREET4, T3FREE, THYROIDAB in the last 72 hours. Anemia Panel: No results for input(s): VITAMINB12, FOLATE, FERRITIN, TIBC, IRON, RETICCTPCT in the last 72 hours. Urine analysis:    Component Value Date/Time   COLORURINE YELLOW 03/02/2020 0355   APPEARANCEUR CLEAR 03/02/2020 0355   LABSPEC 1.032 (H) 03/02/2020 0355   PHURINE 6.0 03/02/2020 0355   GLUCOSEU >=500 (A) 03/02/2020 0355   HGBUR NEGATIVE 03/02/2020 0355   BILIRUBINUR NEGATIVE 03/02/2020 0355   KETONESUR NEGATIVE 03/02/2020 0355   PROTEINUR NEGATIVE 03/02/2020 0355   UROBILINOGEN 0.2 10/16/2013 0837   NITRITE NEGATIVE 03/02/2020 0355   LEUKOCYTESUR NEGATIVE 03/02/2020 0355   Sepsis Labs: @LABRCNTIP (procalcitonin:4,lacticidven:4)  ) Recent Results (from the past 240 hour(s))  Resp Panel by RT-PCR (Flu A&B, Covid) Nasopharyngeal Swab     Status: None   Collection Time: 03/02/20 12:59 AM   Specimen: Nasopharyngeal Swab; Nasopharyngeal(NP) swabs in vial transport medium  Result Value Ref Range Status   SARS Coronavirus 2 by RT PCR NEGATIVE NEGATIVE Final    Comment: (NOTE) SARS-CoV-2 target nucleic acids are NOT DETECTED.  The SARS-CoV-2 RNA is generally detectable in upper respiratory specimens during the acute phase of infection. The lowest concentration of SARS-CoV-2 viral copies this assay can detect is 138 copies/mL. A negative result does not preclude SARS-Cov-2 infection and should not be used as the sole basis for treatment or other patient management  decisions. A negative result may occur with  improper specimen collection/handling, submission of specimen other than nasopharyngeal swab, presence of viral mutation(s) within the areas targeted by this assay, and inadequate number of viral copies(<138 copies/mL). A negative result must be combined with clinical observations, patient history, and epidemiological information. The expected result is Negative.  Fact Sheet for Patients:  04/30/20  Fact Sheet for Healthcare Providers:  BloggerCourse.com  This test is no t yet approved or cleared by the [employer] FDA and  has been authorized for detection and/or diagnosis of SARS-CoV-2 by FDA under an Emergency Use Authorization (EUA). This EUA will remain  in effect (meaning this test can be used) for the duration of the COVID-19 declaration under Section 564(b)(1) of the Act, 21 U.S.C.section 360bbb-3(b)(1), unless the authorization is terminated  or revoked sooner.       Influenza A by PCR NEGATIVE NEGATIVE Final   Influenza B by PCR NEGATIVE NEGATIVE Final    Comment: (NOTE) The Xpert Xpress SARS-CoV-2/FLU/RSV plus assay is intended as an aid in the diagnosis of influenza from Nasopharyngeal swab specimens and should not be used as a sole basis for treatment. Nasal washings and aspirates are unacceptable for Xpert Xpress SARS-CoV-2/FLU/RSV testing.  Fact Sheet for Patients: [place]  Fact Sheet for Healthcare Providers: BloggerCourse.com  This test is not yet approved or cleared by the [employer] FDA and has been authorized for detection and/or diagnosis of SARS-CoV-2 by FDA under an Emergency Use Authorization (EUA). This EUA will remain in effect (meaning this test can be used) for the duration of the COVID-19 declaration under Section 564(b)(1) of the Act, 21 U.S.C. section 360bbb-3(b)(1), unless the  authorization is terminated or revoked.  Performed at [facility], [address], [place], [place]       Studies: CT ANGIO CHEST PE W OR WO CONTRAST  Result Date: 03/06/2020 CLINICAL DATA:  PE suspected, status post cardiac arrest, ongoing chest pain and shortness of breath, oxygen requirement EXAM: CT ANGIOGRAPHY CHEST WITH CONTRAST TECHNIQUE: Multidetector CT imaging of the chest  was performed using the standard protocol during bolus administration of intravenous contrast. Multiplanar CT image reconstructions and MIPs were obtained to evaluate the vascular anatomy. CONTRAST:  57mL OMNIPAQUE IOHEXOL 350 MG/ML SOLN COMPARISON:  09/10/2007 FINDINGS: Cardiovascular: Satisfactory opacification of the pulmonary arteries to the segmental level. Positive examination for pulmonary embolism with embolus present in the distal right pulmonary artery and lobar to segmental branches of the right lung (series 6, image 136). There is no significant embolus noted in the left lung. Mild, global cardiomegaly. Three-vessel coronary artery calcifications. The RV LV ratio is preserved, less than 0.8. The main pulmonary artery is normal in caliber measuring 2.9 cm. Enlargement of the tubular ascending thoracic aorta, measuring up to 4.5 x 4.3 cm on this non tailored examination. Aortic atherosclerosis. No pericardial effusion. Mediastinum/Nodes: No enlarged mediastinal, hilar, or axillary lymph nodes. Thyroid gland, trachea, and esophagus demonstrate no significant findings. Lungs/Pleura: Small bilateral pleural effusions and associated atelectasis or consolidation. Upper Abdomen: No acute abnormality. Musculoskeletal: No chest wall abnormality. Minimally displaced fracture of the anterior left sixth rib (series 5, image 84) as well as the anterior right second and fifth ribs (series 5, image 33). Review of the MIP images confirms the above findings. IMPRESSION: 1. Positive examination for pulmonary  embolism with embolus present in the distal right pulmonary artery and lobar to segmental branches of the right lung. There is no significant embolus noted in the left lung. 2. Mild, global cardiomegaly. The RV LV ratio is preserved. The main pulmonary artery is normal in caliber. 3. Small bilateral pleural effusions and associated atelectasis or consolidation. 4. Coronary artery disease. 5. Enlargement of the tubular ascending thoracic aorta, measuring up to 4.5 x 4.3 cm on this non tailored examination. This is slightly enlarged compared to prior examination dated 09/09/2017 at which time it measured 4.2 x 4.1 cm. Ascending thoracic aortic aneurysm. Recommend semi-annual imaging followup by CTA or MRA and referral to cardiothoracic surgery if not already obtained. This recommendation follows 2010 ACCF/AHA/AATS/ACR/ASA/SCA/SCAI/SIR/STS/SVM Guidelines for the Diagnosis and Management of Patients With Thoracic Aortic Disease. Circulation. 2010; 121: W119-J478: E266-e369. Aortic aneurysm NOS (ICD10-I71.9) 6. Minimally displaced fractures of the anterior left sixth rib as well as the anterior right second and fifth ribs, likely due to resuscitation in the reported setting of cardiac arrest. Aortic Atherosclerosis (ICD10-I70.0). These results were called by telephone at the time of interpretation on 03/06/2020 at 9:37 am to Dr. Dow AdolphAROLE Willis Kuipers , who verbally acknowledged these results. Electronically Signed   By: Lauralyn PrimesAlex  Bibbey M.D.   On: 03/06/2020 09:38   VAS US LOWER EXTREMITY VENOUS (DVT)  Result Date: 03/06/2020  Lower Venous DVT Study Indications: Pulmonary embolism.  Comparison Study: 04/23/19 previous Performing Technologist: Blanch MediaMegan Riddle RVS  Examination Guidelines: A complete evaluation includes B-mode imaging, spectral Doppler, color Doppler, and power Doppler as needed of all accessible portions of each vessel. Bilateral testing is considered an integral part of a complete examination. Limited examinations for reoccurring  indications may be performed as noted. The reflux portion of the exam is performed with the patient in reverse Trendelenburg.  +---------+---------------+---------+-----------+----------+--------------+ RIGHT    CompressibilityPhasicitySpontaneityPropertiesThrombus Aging +---------+---------------+---------+-----------+----------+--------------+ CFV      Full           Yes      Yes                                 +---------+---------------+---------+-----------+----------+--------------+ SFJ  Full                                                        +---------+---------------+---------+-----------+----------+--------------+ FV Prox  Full                                                        +---------+---------------+---------+-----------+----------+--------------+ FV Mid   Full                                                        +---------+---------------+---------+-----------+----------+--------------+ FV DistalFull                                                        +---------+---------------+---------+-----------+----------+--------------+ PFV      Full                                                        +---------+---------------+---------+-----------+----------+--------------+ POP      Full           Yes      Yes                                 +---------+---------------+---------+-----------+----------+--------------+ PTV      Full                                                        +---------+---------------+---------+-----------+----------+--------------+ PERO     Full                                                        +---------+---------------+---------+-----------+----------+--------------+   +---------+---------------+---------+-----------+----------+--------------+ LEFT     CompressibilityPhasicitySpontaneityPropertiesThrombus Aging  +---------+---------------+---------+-----------+----------+--------------+ CFV      Full           Yes      Yes                                 +---------+---------------+---------+-----------+----------+--------------+ SFJ      Full                                                        +---------+---------------+---------+-----------+----------+--------------+  FV Prox  Full                                                        +---------+---------------+---------+-----------+----------+--------------+ FV Mid   Full                                                        +---------+---------------+---------+-----------+----------+--------------+ FV DistalFull                                                        +---------+---------------+---------+-----------+----------+--------------+ PFV      Full                                                        +---------+---------------+---------+-----------+----------+--------------+ POP      Full           Yes      Yes                                 +---------+---------------+---------+-----------+----------+--------------+ PTV      Full                                                        +---------+---------------+---------+-----------+----------+--------------+ PERO     Full                                                        +---------+---------------+---------+-----------+----------+--------------+     Summary: BILATERAL: - No evidence of deep vein thrombosis seen in the lower extremities, bilaterally. - No evidence of superficial venous thrombosis in the lower extremities, bilaterally. -No evidence of popliteal cyst, bilaterally.   *See table(s) above for measurements and observations. Electronically signed by Waverly Ferrari MD on 03/06/2020 at 2:19:49 PM.    Final     Scheduled Meds: . aspirin  81 mg Oral Daily  . carvedilol  3.125 mg Oral BID WC  . [START ON 03/07/2020] clopidogrel   75 mg Oral Daily  . dextromethorphan-guaiFENesin  2 tablet Oral BID  . docusate sodium  100 mg Oral BID  . famotidine  20 mg Oral BID  . folic acid  1 mg Oral Daily  .  HYDROmorphone (DILAUDID) injection  0.5 mg Intravenous Once  . insulin aspart  0-15 Units Subcutaneous Q4H  . lidocaine  1 application Urethral Once  . multivitamin with minerals  1 tablet Oral Daily  . nicotine  21 mg Transdermal Daily  .  polyethylene glycol  17 g Oral Daily  . rosuvastatin  20 mg Oral QHS  . sodium chloride flush  3 mL Intravenous Q12H  . tamsulosin  0.4 mg Oral QPC supper  . thiamine  100 mg Oral Daily   Or  . thiamine  100 mg Intravenous Daily    Continuous Infusions: . sodium chloride    . sodium chloride 75 mL/hr at 03/06/20 1354  . heparin 1,400 Units/hr (03/06/20 1404)     LOS: 4 days     Darlin Drop, MD [facility] Pager [phone]  If 7PM-7AM, please contact night-coverage [email or web address] Password [facility] 03/06/2020, 6:13 PM

## 2020-03-06 NOTE — Progress Notes (Signed)
ANTICOAGULATION CONSULT NOTE  Pharmacy Consult for heparin Indication: new pulmonary embolus  No Known Allergies  Patient Measurements: Height: 5\' 8"  (172.7 cm) Weight: 91.3 kg (201 lb 3.2 oz) IBW/kg (Calculated) : 68.4 Heparin Dosing Weight: 90kg  Vital Signs: Temp: 98.7 F (37.1 C) (01/11 1614) Temp Source: Oral (01/11 1614) BP: 168/84 (01/11 1614) Pulse Rate: 100 (01/11 1732)  Labs: Recent Labs    03/04/20 1012 03/04/20 1336 03/05/20 0221 03/06/20 0238 03/06/20 2015  HGB  --  13.6  --  12.3*  --   HCT  --  39.4  --  34.8*  --   PLT  --  172  --  218  --   HEPARINUNFRC  --   --   --   --  0.25*  CREATININE 0.97  --  0.95 1.02  --     Estimated Creatinine Clearance: 82.4 mL/min (by C-G formula based on SCr of 1.02 mg/dL).   Assessment: 65 year old male with history of cva on low dose rivaroxaban prior to admit, was on hold for cath, which was done yesterday. New PE noted on CT this morning. Orders to start IV heparin. Low dose enoxaparin was given this morning, will start IV heparin without a bolus. Hgb stable overnight.   Heparin level is subtherapeutic at 0.25 on 1400 units/hr. No bleeding noted. No problems with bleeding or infusion per RN.  Goal of Therapy:  Heparin level 0.3-0.7 units/ml Monitor platelets by anticoagulation protocol: Yes   Plan:  Increase heparin drip to 1600 units/hr 6 hr heparin level Daily heparin level and CBC Monitor for s/sx of bleeding  Thank you for involving pharmacy in this patient's care.  70, PharmD, BCPS Clinical Pharmacist Clinical phone for 03/06/2020 until 10p is [phone] 03/06/2020 9:11 PM  **Pharmacist phone directory can be found on [email or web address] listed under [facility]**

## 2020-03-06 NOTE — TOC Benefit Eligibility Note (Signed)
Transition of Care [facility]) Benefit Eligibility Note    Patient Details  Name: [name] MRN: [id number] Date of Birth: [birth year]   Medication/Dose: Everlene Balls  5 MG BID : CO-PAY- $ 4.00   and   BRILINTA 90 MG BID: CO-PAY- $4.00  Covered?: Yes  Tier: 3 Drug  Prescription Coverage [facility]erred [facility]: [facility]  Spoke with Person/Company/Phone Number:: CHRISTIN  @ [employer] #  [phone]  Co-Pay: $ 4.00  Prior Approval: No  Deductible: Unmet       Mardene Sayer Phone Number: 03/06/2020, 12:31 PM

## 2020-03-06 NOTE — Progress Notes (Signed)
CARDIAC REHAB PHASE I   PRE:  Rate/Rhythm: 91 SR    BP: lying asleep 148/89    SaO2: 92 RA  Came to ambulate however pt sleeping soundly and RN had just received heparin. will hold today. Will f/u tomorrow. [phone] Ethelda Chick CES, ACSM 2:04 PM 03/06/2020

## 2020-03-06 NOTE — Progress Notes (Signed)
ANTICOAGULATION CONSULT NOTE - Initial Consult  Pharmacy Consult for heparin Indication: new pulmonary embolus  No Known Allergies  Patient Measurements: Height: 5\' 8"  (172.7 cm) Weight: 91.3 kg (201 lb 3.2 oz) IBW/kg (Calculated) : 68.4 Heparin Dosing Weight: 90kg  Vital Signs: Temp: 97.7 F (36.5 C) (01/11 0728) Temp Source: Oral (01/11 0728) BP: 155/92 (01/11 0728) Pulse Rate: 90 (01/11 0728)  Labs: Recent Labs    03/04/20 1012 03/04/20 1336 03/05/20 0221 03/06/20 0238  HGB  --  13.6  --  12.3*  HCT  --  39.4  --  34.8*  PLT  --  172  --  218  CREATININE 0.97  --  0.95 1.02    Estimated Creatinine Clearance: 82.4 mL/min (by C-G formula based on SCr of 1.02 mg/dL).   Medical History: Past Medical History:  Diagnosis Date  . Depression   . Diabetes mellitus without complication (HCC)   . Hypertension   . Stroke [facility])     Assessment: 65 year old male with history of cva on low dose rivaroxaban prior to admit, was on hold for cath, which was done yesterday. New PE noted on CT this morning. Orders to start IV heparin. Low dose enoxaparin was given this morning, will start IV heparin without a bolus. Hgb stable overnight.   Goal of Therapy:  Heparin level 0.3-0.7 units/ml Monitor platelets by anticoagulation protocol: Yes   Plan:  Start heparin infusion at 1400 units/hr units/hr Check anti-Xa level in 6 hours and daily while on heparin Continue to monitor H&H and platelets  68 PharmD., BCPS Clinical Pharmacist 03/06/2020 12:17 PM

## 2020-03-06 NOTE — Progress Notes (Signed)
Physical Therapy Treatment Patient Details Name: [name] MRN: [id number] DOB: [birth year] Today's Date: 03/06/2020    History of Present Illness 65 yo with past medical history significant for essential hypertension, type 2 diabetes, prior CVA and chronic depression who was brought in by ems after witnessed arrest.    PT Comments    Pt making good progress with mobility. Able to amb in hall with supervision. Recommend home with [facility] when medically ready.    Follow Up Recommendations  Home health PT;Supervision - Intermittent     Equipment Recommendations  None recommended by PT    Recommendations for Other Services       Precautions / Restrictions Precautions Precautions: Fall    Mobility  Bed Mobility Overal bed mobility: Modified Independent             General bed mobility comments: Pt had gotten up unassisted  Transfers Overall transfer level: Needs assistance Equipment used: Rolling walker (2 wheeled) Transfers: Sit to/from Stand Sit to Stand: Supervision         General transfer comment: supervision for lines and safety  Ambulation/Gait Ambulation/Gait assistance: Supervision Gait Distance (Feet): 150 Feet Assistive device: Rolling walker (2 wheeled) Gait Pattern/deviations: Step-through pattern;Decreased stride length Gait velocity: decr Gait velocity interpretation: 1.31 - 2.62 ft/sec, indicative of limited community ambulator General Gait Details: Assist for safety. No loss of balance   Stairs             Wheelchair Mobility    Modified Rankin (Stroke Patients Only)       Balance Overall balance assessment: Needs assistance Sitting-balance support: No upper extremity supported;Feet supported Sitting balance-Leahy Scale: Good     Standing balance support: During functional activity;No upper extremity supported Standing balance-Leahy Scale: Fair                              Cognition Arousal/Alertness:  Awake/alert Behavior During Therapy: Impulsive Overall Cognitive Status: No family/caregiver present to determine baseline cognitive functioning                                 General Comments: Pt had gotten OOB and walked around the bed to urinate in the trashcan      Exercises      General Comments General comments (skin integrity, edema, etc.): VSS. SpO2 95% on RA with amb      Pertinent Vitals/Pain Pain Assessment: No/denies pain    Home Living                      Prior Function            PT Goals (current goals can now be found in the care plan section) Acute Rehab PT Goals Potential to Achieve Goals: Fair Progress towards PT goals: Progressing toward goals    Frequency    Min 3X/week      PT Plan Current plan remains appropriate    Co-evaluation              AM-PAC PT "6 Clicks" Mobility   Outcome Measure  Help needed turning from your back to your side while in a flat bed without using bedrails?: None Help needed moving from lying on your back to sitting on the side of a flat bed without using bedrails?: None Help needed moving to and from a bed to a chair (  including a wheelchair)?: A Little Help needed standing up from a chair using your arms (e.g., wheelchair or bedside chair)?: A Little Help needed to walk in hospital room?: A Little Help needed climbing 3-5 steps with a railing? : A Little 6 Click Score: 20    End of Session Equipment Utilized During Treatment: Gait belt Activity Tolerance: Patient tolerated treatment well Patient left: with call bell/phone within reach;with chair alarm set;in chair Nurse Communication: Mobility status PT Visit Diagnosis: Muscle weakness (generalized) (M62.81);Other abnormalities of gait and mobility (R26.89)     Time: 1102-1130 PT Time Calculation (min) (ACUTE ONLY): 28 min  Charges:  $Gait Training: 23-37 mins                     [facility] PT Acute Rehabilitation  Services Pager [phone] Office [phone]    Angelina Ok [facility] 03/06/2020, 12:46 PM

## 2020-03-06 NOTE — Progress Notes (Signed)
Lower extremity venous has been completed.   Preliminary results in CV Proc.   Blanch Media 03/06/2020 11:51 AM

## 2020-03-06 NOTE — Progress Notes (Addendum)
Progress Note  Patient Name: Ian JubaHugh Donald [name] Date of Encounter: 03/06/2020  Primary Cardiologist: Dietrich PatesPaula Ross, MD  Subjective   Feeling good this morning, no further hemoptysis, no CP or SOB. Was able to sleep late.  Interim developments: CTA + for PE. Of note, patient was reportedly on low dose Xarelto as OP for hx of stroke but had not been taking this for quite some time, at least 6 months, citing that someone took him off this.  Inpatient Medications    Scheduled Meds: . aspirin  81 mg Oral Daily  . carvedilol  3.125 mg Oral BID WC  . dextromethorphan-guaiFENesin  2 tablet Oral BID  . docusate sodium  100 mg Oral BID  . enoxaparin (LOVENOX) injection  40 mg Subcutaneous Q24H  . famotidine  20 mg Oral BID  . folic acid  1 mg Oral Daily  .  HYDROmorphone (DILAUDID) injection  0.5 mg Intravenous Once  . insulin aspart  0-15 Units Subcutaneous Q4H  . lidocaine  1 application Urethral Once  . multivitamin with minerals  1 tablet Oral Daily  . nicotine  21 mg Transdermal Daily  . polyethylene glycol  17 g Oral Daily  . rosuvastatin  20 mg Oral QHS  . sodium chloride flush  3 mL Intravenous Q12H  . tamsulosin  0.4 mg Oral QPC supper  . thiamine  100 mg Oral Daily   Or  . thiamine  100 mg Intravenous Daily  . ticagrelor  90 mg Oral BID   Continuous Infusions: . sodium chloride     PRN Meds: sodium chloride, acetaminophen, docusate sodium, HYDROcodone-acetaminophen, ipratropium-albuterol, loperamide, LORazepam **OR** LORazepam, morphine injection, ondansetron (ZOFRAN) IV, polyethylene glycol, sodium chloride, sodium chloride flush   Vital Signs    Vitals:   03/06/20 0100 03/06/20 0300 03/06/20 0429 03/06/20 0728  BP: 122/82 126/69  (!) 155/92  Pulse:    90  Resp:    19  Temp: 98.9 F (37.2 C)   97.7 F (36.5 C)  TempSrc: Oral   Oral  SpO2:    98%  Weight:   91.3 kg   Height:        Intake/Output Summary (Last 24 hours) at 03/06/2020 0941 Last data filed at  03/06/2020 0456 Gross per 24 hour  Intake 1134.55 ml  Output 1750 ml  Net -615.45 ml   Last 3 Weights 03/06/2020 03/05/2020 03/04/2020  Weight (lbs) 201 lb 3.2 oz 202 lb 1.6 oz 208 lb 15.9 oz  Weight (kg) 91.264 kg 91.672 kg 94.8 kg  Some encounter information is confidential and restricted. Go to Review Flowsheets activity to see all data.     Telemetry    NSR - Personally Reviewed  ECG    NSR 91bpm NSSTW changes - Personally Reviewed  Physical Exam   GEN: No acute distress.  HEENT: Normocephalic, atraumatic, sclera non-icteric. Neck: No JVD or bruits. Cardiac: RRR no murmurs, rubs, or gallops.  Radials/DP/PT 1+ and equal bilaterally.  Respiratory: Coarse BS to auscultation bilaterally. Breathing is unlabored. GI: Soft, nontender, non-distended, BS +x 4. MS: no deformity. Extremities: No clubbing or cyanosis. No edema. Distal pedal pulses are 2+ and equal bilaterally. Right groin cath site without hematoma, ecchymosis, or bruit. Neuro:  AAOx3. Follows commands. Psych:  Responds to questions appropriately with a normal affect.  Labs    High Sensitivity Troponin:   Recent Labs  Lab 03/02/20 0058 03/02/20 0358 03/02/20 1024 03/02/20 1442  TROPONINIHS 23* 102* 463* 311*  Cardiac EnzymesNo results for input(s): TROPONINI in the last 168 hours. No results for input(s): TROPIPOC in the last 168 hours.   Chemistry Recent Labs  Lab 03/02/20 0058 03/02/20 0358 03/04/20 1012 03/05/20 0221 03/06/20 0238  NA 131*   < > 130* 131* 134*  K 3.9   < > 3.8 3.8 4.0  CL 96*   < > 95* 93* 95*  CO2 22   < > 25 26 27   GLUCOSE 476*   < > 164* 175* 157*  BUN 15   < > 12 15 13   CREATININE 1.55*   < > 0.97 0.95 1.02  CALCIUM 8.7*   < > 8.7* 8.8* 8.7*  PROT 6.4*  --   --   --  6.0*  ALBUMIN 3.2*  --   --   --  2.4*  AST 138*  --   --   --  16  ALT 99*  --   --   --  20  ALKPHOS 80  --   --   --  62  BILITOT 0.5  --   --   --  0.2*  GFRNONAA 50*   < > >60 >60 >60  ANIONGAP 13    < > 10 12 12    < > = values in this interval not displayed.     Hematology Recent Labs  Lab 03/03/20 0234 03/04/20 1336 03/06/20 0238  WBC 11.9* 5.9 4.7  RBC 4.42 4.57 4.06*  HGB 13.0 13.6 12.3*  HCT 39.8 39.4 34.8*  MCV 90.0 86.2 85.7  MCH 29.4 29.8 30.3  MCHC 32.7 34.5 35.3  RDW 13.2 12.8 12.8  PLT 167 172 218    BNP Recent Labs  Lab 03/04/20 1336  BNP 52.4     DDimer No results for input(s): DDIMER in the last 168 hours.   Radiology    CT ANGIO CHEST PE W OR WO CONTRAST  Result Date: 03/06/2020 CLINICAL DATA:  PE suspected, status post cardiac arrest, ongoing chest pain and shortness of breath, oxygen requirement EXAM: CT ANGIOGRAPHY CHEST WITH CONTRAST TECHNIQUE: Multidetector CT imaging of the chest was performed using the standard protocol during bolus administration of intravenous contrast. Multiplanar CT image reconstructions and MIPs were obtained to evaluate the vascular anatomy. CONTRAST:  57mL OMNIPAQUE IOHEXOL 350 MG/ML SOLN COMPARISON:  09/10/2007 FINDINGS: Cardiovascular: Satisfactory opacification of the pulmonary arteries to the segmental level. Positive examination for pulmonary embolism with embolus present in the distal right pulmonary artery and lobar to segmental branches of the right lung (series 6, image 136). There is no significant embolus noted in the left lung. Mild, global cardiomegaly. Three-vessel coronary artery calcifications. The RV LV ratio is preserved, less than 0.8. The main pulmonary artery is normal in caliber measuring 2.9 cm. Enlargement of the tubular ascending thoracic aorta, measuring up to 4.5 x 4.3 cm on this non tailored examination. Aortic atherosclerosis. No pericardial effusion. Mediastinum/Nodes: No enlarged mediastinal, hilar, or axillary lymph nodes. Thyroid gland, trachea, and esophagus demonstrate no significant findings. Lungs/Pleura: Small bilateral pleural effusions and associated atelectasis or consolidation. Upper  Abdomen: No acute abnormality. Musculoskeletal: No chest wall abnormality. Minimally displaced fracture of the anterior left sixth rib (series 5, image 84) as well as the anterior right second and fifth ribs (series 5, image 33). Review of the MIP images confirms the above findings. IMPRESSION: 1. Positive examination for pulmonary embolism with embolus present in the distal right pulmonary artery and lobar to segmental branches of the right  lung. There is no significant embolus noted in the left lung. 2. Mild, global cardiomegaly. The RV LV ratio is preserved. The main pulmonary artery is normal in caliber. 3. Small bilateral pleural effusions and associated atelectasis or consolidation. 4. Coronary artery disease. 5. Enlargement of the tubular ascending thoracic aorta, measuring up to 4.5 x 4.3 cm on this non tailored examination. This is slightly enlarged compared to prior examination dated 09/09/2017 at which time it measured 4.2 x 4.1 cm. Ascending thoracic aortic aneurysm. Recommend semi-annual imaging followup by CTA or MRA and referral to cardiothoracic surgery if not already obtained. This recommendation follows 2010 ACCF/AHA/AATS/ACR/ASA/SCA/SCAI/SIR/STS/SVM Guidelines for the Diagnosis and Management of Patients With Thoracic Aortic Disease. Circulation. 2010; 121: D664-Q034. Aortic aneurysm NOS (ICD10-I71.9) 6. Minimally displaced fractures of the anterior left sixth rib as well as the anterior right second and fifth ribs, likely due to resuscitation in the reported setting of cardiac arrest. Aortic Atherosclerosis (ICD10-I70.0). These results were called by telephone at the time of interpretation on 03/06/2020 at 9:37 am to Dr. Dow Adolph , who verbally acknowledged these results. Electronically Signed   By: Lauralyn Primes M.D.   On: 03/06/2020 09:38   CARDIAC CATHETERIZATION  Addendum Date: 03/05/2020   Conclusions: 1. Severe native coronary artery disease with chronic total occlusions of the ostial  LAD and RCA as well as 99% stenosis of distal RCA that is supplied by SVG to RCA and multifocal moderate to severe disease disease involving the LCx and OM1. 2. Widely patent LIMA to LAD. 3. Widely patent SVG to distal RCA with 99% stenosis in the native RCA beyond the SVG anastomosis. 4. Upper normal left ventricular filling pressure. 5. Successful PCI to distal RCA through SVG to RCA using Resolute Onyx 2.5 x 12 mm drug-eluting stent (postdilated to 2.9 mm) with 0% residual stenosis and TIMI-3 flow. 6. Incidental note made of occluded proximal right superficial femoral artery on sheath angiogram. Recommendations: 1. Dual antiplatelet therapy with aspirin and ticagrelor for at least 12 months. 2. Aggressive secondary prevention. 3. Favor medical therapy for LCx/OM disease. 4. Optimize goal-directed medical therapy for mildly reduced LVEF. 5. Remove right femoral artery sheath with manual compression 2 hours after discontinuation of bivalirudin at the end of the procedure. 6. Consider outpatient lower extremity noninvasive vascular testing. Yvonne Kendall, MD [facility]   Result Date: 03/05/2020 Conclusions: 1. Severe native coronary artery disease with chronic total occlusions of the ostial LAD and RCA as well as 99% stenosis of distal RCA that is supplied by SVG to RCA and multifocal moderate to severe disease disease involving the LCx and OM1. 2. Widely patent LIMA to LAD. 3. Widely patent SVG to distal RCA with 99% stenosis in the native RCA beyond the SVG anastomosis. 4. Upper normal left ventricular filling pressure. 5. Successful PCI to distal RCA through SVG to RCA using Resolute Onyx 2.5 x 12 mm drug-eluting stent (postdilated to 2.9 mm) with 0% residual stenosis and TIMI-3 flow. Recommendations: 1. Dual antiplatelet therapy with aspirin and ticagrelor for at least 12 months. 2. Aggressive secondary prevention. 3. Favor medical therapy for LCx/OM disease. 4. Optimize goal-directed medical therapy for  mildly reduced LVEF. 5. Remove right femoral artery sheath with manual compression 2 hours after discontinuation of bivalirudin at the end of the procedure. Yvonne Kendall, MD [facility]    Cardiac Studies   2D echo 03/02/20  1. Left ventricular ejection fraction, by estimation, is 45 to 50%. The  left ventricle has mildly decreased  function. The left ventricle  demonstrates global hypokinesis. There is mild left ventricular  hypertrophy. Left ventricular diastolic parameters  are consistent with Grade I diastolic dysfunction (impaired relaxation).  2. Right ventricular systolic function is mildly reduced. The right  ventricular size is normal. Tricuspid regurgitation signal is inadequate  for assessing PA pressure.  3. The mitral valve is normal in structure. No evidence of mitral valve  regurgitation. No evidence of mitral stenosis.  4. The aortic valve is tricuspid. Aortic valve regurgitation is not  visualized. No aortic stenosis is present.  5. Aortic dilatation noted. There is mild dilatation of the aortic root,  measuring 42 mm.  6. The inferior vena cava is normal in size with greater than 50%  respiratory variability, suggesting right atrial pressure of 3 mmHg.   Patient Profile     65 y.o. male with CAD s/p CABG 1992 at [facility] without cardiac follow-up recently, CVA, HTN, DM2, CKD stage II-III, tobacco use, depression, aortic root and abdominal aneurysm (followed by [facility]), and history of illicit drug use admitted with cardiac vs respiratory arrest. CPR initiated, agonal breathing on arrival to ER, ROSC achieved without shock or ACLS. Possibly found with white powder nearby.  2D echo showed mild LV dysfunction and workup showed elevated troponins. Pt denied use of amphetamines for several months. UDS + amphetamines, benzos, THC. Cardiology seeing for elevation in troponin with resultant cath above. Also found to have persistent hypoxia/hemoptysis with + PE.  Assessment &  Plan    1. Out of hospital cardiac vs respiratory arrest with acute hypoxic respiratory failure - pt did not recall events leading to event - had CPR but no shock or ACLS meds per notes - agree with ED that the fact that spontaneous circulation was achieved without medication or electricity suggests that he never truly lost circulation - concern yesterday raised for possible PE given continued hypoxia and mild hemoptysis, and the fact that coronary disease could not totally explain his presentation - CT angio positive for pulmonary embolism, so now on heparin per pharmacy  2. CAD s/p CABG with possible NSTEMI - s/p cath yesterday, ordered by Dr. Tenny Craw. Per Dr. Okey Dupre, culprit looked to be distal RCA where there was a 99% lesion distal to the SVG - also moderate-severe lesions in the Cx. S/p PCI to dRCA with residual disease to be treated medically - was placed on Brilinta in addition to home ASA post cath - suspect we will need to switch Brilinta to Plavix given need for triple therapy due to pulmonary embolism - UDS + amphetamines, benzos, THC)  - 2D echo with EF 45-50%, mildly reduced RV function, no pulmonary HTN - statin initiated this admission - If the patient is tolerating statin at time of follow-up appointment, would consider rechecking liver function/lipid panel in 6-8 weeks.  3. Cardiomyopathy, possibly ischemic - EF 45-50% by echo - continue BB - consider add ARB if BP remains elevated   4. Essential HTN - SBP 120s-150s, follow in context of the above  5. Acute kidney injury - admit Cr 1.55, improved and remains stable  6. H/o polysubstance use - UDS as above; patient denied use of amphetamines for several months although admit notes do report pt was found with some sort of white powder - cessation advised  7. Aortic root and abdominal aneurysm - 58mm dilation of aortic root by echo this admission - 4.5x4.3 enlargement of tubular ascending thoracic aorta - recommend to  continue to follow with [facility] as  outpatient  For questions or updates, please contact [facility] Please consult [email or web address] for contact info under Cardiology/STEMI.  Signed, Laurann Montana, PA-C 03/06/2020, 9:41 AM    Attending Note:   The patient was seen and examined.  Agree with assessment and plan as noted above.  Changes made to the above note as needed.  Patient seen and independently examined with  Samuella Bruin, PA .   We discussed all aspects of the encounter. I agree with the assessment and plan as stated above.  1.   Out of hospital arrest-?  Respiratory versus cardiac: The patient had a heart catheterization yesterday.  Was found to have a tight distal right coronary artery stenosis but this vessel was not big enough to explain his cardiorespiratory arrest.  He also has been found to have a pulmonary embolus.    In addition, he had positive UDS for multiple drugs.  He is loss of consciousness was likely a combination of these issues.  2.  Pulmonary embolus: He will need to be started on DOAC on discharge.  We will change his Brilinta to Plavix.  I anticipate it will take Plavix, aspirin, for 1 month.  At that point we will discontinue the aspirin and continue Plavix plus DOAC.   I have spent a total of 40 minutes with patient reviewing hospital  notes , telemetry, EKGs, labs and examining patient as well as establishing an assessment and plan that was discussed with the patient. > 50% of time was spent in direct patient care.    Vesta Mixer, Montez Hageman., MD, [facility] 03/06/2020, 10:24 AM [address],  [address] Office [phone] Pager [phone]

## 2020-03-07 DIAGNOSIS — I2699 Other pulmonary embolism without acute cor pulmonale: Secondary | ICD-10-CM | POA: Diagnosis not present

## 2020-03-07 DIAGNOSIS — I2510 Atherosclerotic heart disease of native coronary artery without angina pectoris: Secondary | ICD-10-CM | POA: Diagnosis not present

## 2020-03-07 DIAGNOSIS — I214 Non-ST elevation (NSTEMI) myocardial infarction: Secondary | ICD-10-CM | POA: Diagnosis not present

## 2020-03-07 DIAGNOSIS — I469 Cardiac arrest, cause unspecified: Secondary | ICD-10-CM | POA: Diagnosis not present

## 2020-03-07 LAB — CBC
HCT: 36.1 % — ABNORMAL LOW (ref 39.0–52.0)
Hemoglobin: 12.1 g/dL — ABNORMAL LOW (ref 13.0–17.0)
MCH: 29.3 pg (ref 26.0–34.0)
MCHC: 33.5 g/dL (ref 30.0–36.0)
MCV: 87.4 fL (ref 80.0–100.0)
Platelets: 230 10*3/uL (ref 150–400)
RBC: 4.13 MIL/uL — ABNORMAL LOW (ref 4.22–5.81)
RDW: 12.6 % (ref 11.5–15.5)
WBC: 5.2 10*3/uL (ref 4.0–10.5)
nRBC: 0 % (ref 0.0–0.2)

## 2020-03-07 LAB — HEPARIN LEVEL (UNFRACTIONATED)
Heparin Unfractionated: 0.25 IU/mL — ABNORMAL LOW (ref 0.30–0.70)
Heparin Unfractionated: 0.36 IU/mL (ref 0.30–0.70)

## 2020-03-07 LAB — BASIC METABOLIC PANEL
Anion gap: 12 (ref 5–15)
BUN: 10 mg/dL (ref 8–23)
CO2: 22 mmol/L (ref 22–32)
Calcium: 8.3 mg/dL — ABNORMAL LOW (ref 8.9–10.3)
Chloride: 99 mmol/L (ref 98–111)
Creatinine, Ser: 0.94 mg/dL (ref 0.61–1.24)
GFR, Estimated: >60 mL/min (ref >60)
Glucose, Bld: 136 mg/dL — ABNORMAL HIGH (ref 70–99)
Potassium: 3.8 mmol/L (ref 3.5–5.1)
Sodium: 133 mmol/L — ABNORMAL LOW (ref 135–145)

## 2020-03-07 LAB — GLUCOSE, CAPILLARY
Glucose-Capillary: 105 mg/dL — ABNORMAL HIGH (ref 70–99)
Glucose-Capillary: 130 mg/dL — ABNORMAL HIGH (ref 70–99)
Glucose-Capillary: 130 mg/dL — ABNORMAL HIGH (ref 70–99)
Glucose-Capillary: 133 mg/dL — ABNORMAL HIGH (ref 70–99)
Glucose-Capillary: 135 mg/dL — ABNORMAL HIGH (ref 70–99)
Glucose-Capillary: 154 mg/dL — ABNORMAL HIGH (ref 70–99)

## 2020-03-07 MED ORDER — SACUBITRIL-VALSARTAN 24-26 MG PO TABS
1.0000 | ORAL_TABLET | Freq: Two times a day (BID) | ORAL | Status: DC
  Administered 2020-03-07 – 2020-03-09 (×4): 1 via ORAL
  Filled 2020-03-07 (×4): qty 1

## 2020-03-07 NOTE — Progress Notes (Signed)
ANTICOAGULATION CONSULT NOTE  Pharmacy Consult for heparin Indication: new pulmonary embolus  No Known Allergies  Patient Measurements: Height: 5\' 8"  (172.7 cm) Weight: 90.1 kg (198 lb 10.2 oz) IBW/kg (Calculated) : 68.4 Heparin Dosing Weight: 90kg  Vital Signs: Temp: 98.3 F (36.8 C) (01/12 0435) Temp Source: Oral (01/12 0435) BP: 156/92 (01/12 0435) Pulse Rate: 94 (01/12 0435)  Labs: Recent Labs    03/04/20 1336 03/05/20 0221 03/06/20 0238 03/06/20 2015 03/07/20 0426  HGB 13.6  --  12.3*  --  12.1*  HCT 39.4  --  34.8*  --  36.1*  PLT 172  --  218  --  230  HEPARINUNFRC  --   --   --  0.25* 0.25*  CREATININE  --  0.95 1.02  --  0.94    Estimated Creatinine Clearance: 88.9 mL/min (by C-G formula based on SCr of 0.94 mg/dL).   Assessment: 65 year old male with history of cva on low dose rivaroxaban prior to admit, was on hold for cath, which was done yesterday. New PE noted on CT this morning. Orders to start IV heparin. Low dose enoxaparin was given this morning, will start IV heparin without a bolus. Hgb stable overnight.   Heparin level is now at goal at 0.36 on 1900 units/hr. No bleeding noted. No problems with bleeding or infusion per RN. Will increase to keep in middle of range given PE.   Goal of Therapy:  Heparin level 0.3-0.7 units/ml Monitor platelets by anticoagulation protocol: Yes   Plan:  Increase heparin drip to 2000 units/hr Daily heparin level and CBC Monitor for s/sx of bleeding  Thank you for involving pharmacy in this patient's care.  68 PharmD., BCPS Clinical Pharmacist 03/07/2020 7:38 AM  **Pharmacist phone directory can be found on [email or web address] listed under [facility]**

## 2020-03-07 NOTE — Progress Notes (Addendum)
Progress Note  Patient Name: Tavin Vernet Date of Encounter: 03/07/2020  [facility] Cardiologist: Dorris Carnes, MD   Subjective   Remains on O2 [place], no pain.  Inpatient Medications    Scheduled Meds: . aspirin  81 mg Oral Daily  . carvedilol  3.125 mg Oral BID WC  . clopidogrel  75 mg Oral Daily  . dextromethorphan-guaiFENesin  2 tablet Oral BID  . docusate sodium  100 mg Oral BID  . famotidine  20 mg Oral BID  . folic acid  1 mg Oral Daily  .  HYDROmorphone (DILAUDID) injection  0.5 mg Intravenous Once  . insulin aspart  0-15 Units Subcutaneous Q4H  . lidocaine  1 application Urethral Once  . multivitamin with minerals  1 tablet Oral Daily  . nicotine  21 mg Transdermal Daily  . polyethylene glycol  17 g Oral Daily  . rosuvastatin  20 mg Oral QHS  . sodium chloride flush  3 mL Intravenous Q12H  . tamsulosin  0.4 mg Oral QPC supper  . thiamine  100 mg Oral Daily   Or  . thiamine  100 mg Intravenous Daily   Continuous Infusions: . sodium chloride    . sodium chloride 75 mL/hr at 03/07/20 0832  . heparin 1,900 Units/hr (03/07/20 0545)   PRN Meds: sodium chloride, acetaminophen, docusate sodium, HYDROcodone-acetaminophen, ipratropium-albuterol, loperamide, LORazepam **OR** LORazepam, morphine injection, ondansetron (ZOFRAN) IV, polyethylene glycol, sodium chloride, sodium chloride flush   Vital Signs    Vitals:   03/06/20 2140 03/07/20 0016 03/07/20 0435 03/07/20 0751  BP: (!) 159/94 (!) 162/96 (!) 156/92 (!) 176/106  Pulse: 93 93 94 87  Resp: _0 Temp: 99.1 F (37.3 C) 99 F (37.2 C) 98.3 F (36.8 C) 97.7 F (36.5 C)  TempSrc: Oral Oral Oral Oral  SpO2: 92% 94% 94% 94%  Weight:   90.1 kg   Height:        Intake/Output Summary (Last 24 hours) at 03/07/2020 0857 Last data filed at 03/07/2020 0755 Gross per 24 hour  Intake 240 ml  Output 1000 ml  Net -760 ml   Last 3 Weights 03/07/2020 03/06/2020 03/05/2020  Weight (lbs) 198 lb 10.2 oz 201  lb 3.2 oz 202 lb 1.6 oz  Weight (kg) 90.1 kg 91.264 kg 91.672 kg  Some encounter information is confidential and restricted. Go to Review Flowsheets activity to see all data.      Telemetry    Sinus in the 80-90s - Personally Reviewed  ECG    No new tracings - Personally Reviewed  Physical Exam   GEN: No acute distress.   Neck: No JVD Cardiac: RRR, no murmurs, rubs, or gallops.  Respiratory: Clear to auscultation bilaterally. GI: Soft, nontender, non-distended  MS: No edema; No deformity. Neuro:  Nonfocal  Psych: Normal affect    Labs    High Sensitivity Troponin:   Recent Labs  Lab 03/02/20 0058 03/02/20 0358 03/02/20 1024 03/02/20 1442  TROPONINIHS 23* 102* 463* 311*      Chemistry Recent Labs  Lab 03/02/20 0058 03/02/20 0358 03/05/20 0221 03/06/20 0238 03/07/20 0426  NA 131*   < > 131* 134* 133*  K 3.9   < > 3.8 4.0 3.8  CL 96*   < > 93* 95* 99  CO2 22   < > _1 GLUCOSE 476*   < > 175* 157* 136*  BUN 15   < > _2 CREATININE 1.55*   < >  0.95 1.02 0.94  CALCIUM 8.7*   < > 8.8* 8.7* 8.3*  PROT 6.4*  --   --  6.0*  --   ALBUMIN 3.2*  --   --  2.4*  --   AST 138*  --   --  16  --   ALT 99*  --   --  20  --   ALKPHOS 80  --   --  62  --   BILITOT 0.5  --   --  0.2*  --   GFRNONAA 50*   < > >60 >60 >60  ANIONGAP 13   < > _0 < > = values in this interval not displayed.     Hematology Recent Labs  Lab 03/04/20 1336 03/06/20 0238 03/07/20 0426  WBC 5.9 4.7 5.2  RBC 4.57 4.06* 4.13*  HGB 13.6 12.3* 12.1*  HCT 39.4 34.8* 36.1*  MCV 86.2 85.7 87.4  MCH 29.8 30.3 29.3  MCHC 34.5 35.3 33.5  RDW 12.8 12.8 12.6  PLT 172 218 230    BNP Recent Labs  Lab 03/04/20 1336  BNP 52.4     DDimer No results for input(s): DDIMER in the last 168 hours.   Radiology    CT ANGIO CHEST PE W OR WO CONTRAST  Result Date: 03/06/2020 CLINICAL DATA:  PE suspected, status post cardiac arrest, ongoing chest pain and shortness of breath,  oxygen requirement EXAM: CT ANGIOGRAPHY CHEST WITH CONTRAST TECHNIQUE: Multidetector CT imaging of the chest was performed using the standard protocol during bolus administration of intravenous contrast. Multiplanar CT image reconstructions and MIPs were obtained to evaluate the vascular anatomy. CONTRAST:  29m OMNIPAQUE IOHEXOL 350 MG/ML SOLN COMPARISON:  09/10/2007 FINDINGS: Cardiovascular: Satisfactory opacification of the pulmonary arteries to the segmental level. Positive examination for pulmonary embolism with embolus present in the distal right pulmonary artery and lobar to segmental branches of the right lung (series 6, image 136). There is no significant embolus noted in the left lung. Mild, global cardiomegaly. Three-vessel coronary artery calcifications. The RV LV ratio is preserved, less than 0.8. The main pulmonary artery is normal in caliber measuring 2.9 cm. Enlargement of the tubular ascending thoracic aorta, measuring up to 4.5 x 4.3 cm on this non tailored examination. Aortic atherosclerosis. No pericardial effusion. Mediastinum/Nodes: No enlarged mediastinal, hilar, or axillary lymph nodes. Thyroid gland, trachea, and esophagus demonstrate no significant findings. Lungs/Pleura: Small bilateral pleural effusions and associated atelectasis or consolidation. Upper Abdomen: No acute abnormality. Musculoskeletal: No chest wall abnormality. Minimally displaced fracture of the anterior left sixth rib (series 5, image 84) as well as the anterior right second and fifth ribs (series 5, image 33). Review of the MIP images confirms the above findings. IMPRESSION: 1. Positive examination for pulmonary embolism with embolus present in the distal right pulmonary artery and lobar to segmental branches of the right lung. There is no significant embolus noted in the left lung. 2. Mild, global cardiomegaly. The RV LV ratio is preserved. The main pulmonary artery is normal in caliber. 3. Small bilateral pleural  effusions and associated atelectasis or consolidation. 4. Coronary artery disease. 5. Enlargement of the tubular ascending thoracic aorta, measuring up to 4.5 x 4.3 cm on this non tailored examination. This is slightly enlarged compared to prior examination dated 09/09/2017 at which time it measured 4.2 x 4.1 cm. Ascending thoracic aortic aneurysm. Recommend semi-annual imaging followup by CTA or MRA and referral to cardiothoracic surgery if not already obtained. This recommendation  follows 2010 ACCF/AHA/AATS/ACR/ASA/SCA/SCAI/SIR/STS/SVM Guidelines for the Diagnosis and Management of Patients With Thoracic Aortic Disease. Circulation. 2010; 121: C623-J628. Aortic aneurysm NOS (ICD10-I71.9) 6. Minimally displaced fractures of the anterior left sixth rib as well as the anterior right second and fifth ribs, likely due to resuscitation in the reported setting of cardiac arrest. Aortic Atherosclerosis (ICD10-I70.0). These results were called by telephone at the time of interpretation on 03/06/2020 at 9:37 am to Dr. Irene Pap , who verbally acknowledged these results. Electronically Signed   By: Eddie Candle M.D.   On: 03/06/2020 09:38   CARDIAC CATHETERIZATION  Addendum Date: 03/05/2020   Conclusions: 1. Severe native coronary artery disease with chronic total occlusions of the ostial LAD and RCA as well as 99% stenosis of distal RCA that is supplied by SVG to RCA and multifocal moderate to severe disease disease involving the LCx and OM1. 2. Widely patent LIMA to LAD. 3. Widely patent SVG to distal RCA with 99% stenosis in the native RCA beyond the SVG anastomosis. 4. Upper normal left ventricular filling pressure. 5. Successful PCI to distal RCA through SVG to RCA using Resolute Onyx 2.5 x 12 mm drug-eluting stent (postdilated to 2.9 mm) with 0% residual stenosis and TIMI-3 flow. 6. Incidental note made of occluded proximal right superficial femoral artery on sheath angiogram. Recommendations: 1. Dual antiplatelet  therapy with aspirin and ticagrelor for at least 12 months. 2. Aggressive secondary prevention. 3. Favor medical therapy for LCx/OM disease. 4. Optimize goal-directed medical therapy for mildly reduced LVEF. 5. Remove right femoral artery sheath with manual compression 2 hours after discontinuation of bivalirudin at the end of the procedure. 6. Consider outpatient lower extremity noninvasive vascular testing. Nelva Bush, MD [facility]   Result Date: 03/05/2020 Conclusions: 1. Severe native coronary artery disease with chronic total occlusions of the ostial LAD and RCA as well as 99% stenosis of distal RCA that is supplied by SVG to RCA and multifocal moderate to severe disease disease involving the LCx and OM1. 2. Widely patent LIMA to LAD. 3. Widely patent SVG to distal RCA with 99% stenosis in the native RCA beyond the SVG anastomosis. 4. Upper normal left ventricular filling pressure. 5. Successful PCI to distal RCA through SVG to RCA using Resolute Onyx 2.5 x 12 mm drug-eluting stent (postdilated to 2.9 mm) with 0% residual stenosis and TIMI-3 flow. Recommendations: 1. Dual antiplatelet therapy with aspirin and ticagrelor for at least 12 months. 2. Aggressive secondary prevention. 3. Favor medical therapy for LCx/OM disease. 4. Optimize goal-directed medical therapy for mildly reduced LVEF. 5. Remove right femoral artery sheath with manual compression 2 hours after discontinuation of bivalirudin at the end of the procedure. Nelva Bush, MD [facility]   VAS [place] LOWER EXTREMITY VENOUS (DVT)  Result Date: 03/06/2020  Lower Venous DVT Study Indications: Pulmonary embolism.  Comparison Study: 04/23/19 previous Performing Technologist: Abram Sander RVS  Examination Guidelines: A complete evaluation includes B-mode imaging, spectral Doppler, color Doppler, and power Doppler as needed of all accessible portions of each vessel. Bilateral testing is considered an integral part of a complete  examination. Limited examinations for reoccurring indications may be performed as noted. The reflux portion of the exam is performed with the patient in reverse Trendelenburg.  +---------+---------------+---------+-----------+----------+--------------+ RIGHT    CompressibilityPhasicitySpontaneityPropertiesThrombus Aging +---------+---------------+---------+-----------+----------+--------------+ CFV      Full           Yes      Yes                                 +---------+---------------+---------+-----------+----------+--------------+  SFJ      Full                                                        +---------+---------------+---------+-----------+----------+--------------+ FV Prox  Full                                                        +---------+---------------+---------+-----------+----------+--------------+ FV Mid   Full                                                        +---------+---------------+---------+-----------+----------+--------------+ FV DistalFull                                                        +---------+---------------+---------+-----------+----------+--------------+ PFV      Full                                                        +---------+---------------+---------+-----------+----------+--------------+ POP      Full           Yes      Yes                                 +---------+---------------+---------+-----------+----------+--------------+ PTV      Full                                                        +---------+---------------+---------+-----------+----------+--------------+ PERO     Full                                                        +---------+---------------+---------+-----------+----------+--------------+   +---------+---------------+---------+-----------+----------+--------------+ LEFT     CompressibilityPhasicitySpontaneityPropertiesThrombus Aging  +---------+---------------+---------+-----------+----------+--------------+ CFV      Full           Yes      Yes                                 +---------+---------------+---------+-----------+----------+--------------+ SFJ      Full                                                        +---------+---------------+---------+-----------+----------+--------------+  FV Prox  Full                                                        +---------+---------------+---------+-----------+----------+--------------+ FV Mid   Full                                                        +---------+---------------+---------+-----------+----------+--------------+ FV DistalFull                                                        +---------+---------------+---------+-----------+----------+--------------+ PFV      Full                                                        +---------+---------------+---------+-----------+----------+--------------+ POP      Full           Yes      Yes                                 +---------+---------------+---------+-----------+----------+--------------+ PTV      Full                                                        +---------+---------------+---------+-----------+----------+--------------+ PERO     Full                                                        +---------+---------------+---------+-----------+----------+--------------+     Summary: BILATERAL: - No evidence of deep vein thrombosis seen in the lower extremities, bilaterally. - No evidence of superficial venous thrombosis in the lower extremities, bilaterally. -No evidence of popliteal cyst, bilaterally.   *See table(s) above for measurements and observations. Electronically signed by Deitra Mayo MD on 03/06/2020 at 2:19:49 PM.    Final     Cardiac Studies   Left heart cath 03/05/20: Conclusions: 1. Severe native coronary artery disease with chronic total  occlusions of the ostial LAD and RCA as well as 99% stenosis of distal RCA that is supplied by SVG to RCA and multifocal moderate to severe disease disease involving the LCx and OM1. 2. Widely patent LIMA to LAD. 3. Widely patent SVG to distal RCA with 99% stenosis in the native RCA beyond the SVG anastomosis. 4. Upper normal left ventricular filling pressure. 5. Successful PCI to distal RCA through SVG to RCA using Resolute Onyx 2.5 x 12 mm drug-eluting stent (postdilated to 2.9 mm) with 0% residual stenosis  and TIMI-3 flow. 6. Incidental note made of occluded proximal right superficial femoral artery on sheath angiogram.  Recommendations: 1. Dual antiplatelet therapy with aspirin and ticagrelor for at least 12 months. 2. Aggressive secondary prevention. 3. Favor medical therapy for LCx/OM disease. 4. Optimize goal-directed medical therapy for mildly reduced LVEF. 5. Remove right femoral artery sheath with manual compression 2 hours after discontinuation of bivalirudin at the end of the procedure. 6. Consider outpatient lower extremity noninvasive vascular testing.   Echo 03/02/20: 1. Left ventricular ejection fraction, by estimation, is 45 to 50%. The  left ventricle has mildly decreased function. The left ventricle  demonstrates global hypokinesis. There is mild left ventricular  hypertrophy. Left ventricular diastolic parameters  are consistent with Grade I diastolic dysfunction (impaired relaxation).  2. Right ventricular systolic function is mildly reduced. The right  ventricular size is normal. Tricuspid regurgitation signal is inadequate  for assessing PA pressure.  3. The mitral valve is normal in structure. No evidence of mitral valve  regurgitation. No evidence of mitral stenosis.  4. The aortic valve is tricuspid. Aortic valve regurgitation is not  visualized. No aortic stenosis is present.  5. Aortic dilatation noted. There is mild dilatation of the aortic root,   measuring 42 mm.  6. The inferior vena cava is normal in size with greater than 50%  respiratory variability, suggesting right atrial pressure of 3 mmHg.    Patient Profile     65 y.o. male with CAD s/p CABG 1992 at [facility] without cardiac follow-up recently, CVA, HTN, DM2, CKD stage II-III, tobacco use, depression, aortic root and abdominal aneurysm (followed by [facility]), and history of illicit drug use admitted with cardiac vs respiratory arrest. CPR initiated, agonal breathing on arrival to ER, ROSC achieved without shock or ACLS. Possibly found with white powder nearby.  2D echo showed mild LV dysfunction and workup showed elevated troponins. Pt denied use of amphetamines for several months. UDS + amphetamines, benzos, THC. Cardiology seeing for elevation in troponin with resultant cath above. Also found to have persistent hypoxia/hemoptysis with + PE.  Assessment & Plan    Out of hospital cardiac vs respiratory arrest with acute hypoxic respiratory failure Acute PE - CPR, no shock or ACLS medications - question if there was a circulatory arrest since no shock or medications were required  - CTA positive for PE --> heparin drip started - pt reportedly was not taking xarelto - was on low dose xarelto for history of stroke, but reports being taken off this for hx of stroke - both eliquis copay will be $4 - will require 10 mg eliquis BID x 7 days then reduce to 5 mg eliquis BID - TOC pharmacy can help with getting patient eliquis kit for PE treatment, 5 mg BID script can be sent separately to his primary pharmacy   CAD s/p CABG NSTEMI - hs troponin peaked at 463 before down-trending - heart cath with 99% lesion in distal RCA as culprit lesion distal to SVG, moderate to severe lesions in the LCx - s/p PCI to distal RCA, medical treatment of remaining disease - started on ASA and brilinta, but switched to plavix given need for anticoagulation - continue triple therapy for one month then  discontinue ASA    Polysubstance abuse - UDS positive for amphetamines, benzos, THC - recommended cessation    Aortic root dilation Ascending aorta enlargement - thoracic aorta enlargement 4.5 x 4.3 cm which is enlarged from prior study in 2019 that measured 4.2  x 4.1 cm - 42 mm aortic root on echo this admission - close BP control - following with [facility]   Cardiomyopathy - suspect component of ischemic cardiomyopathy - EF 45-50% - continue BB --> HR in the 80-90s, increase coreg to 6.25 mg BID - pressure remains elevated - renal function normal - will add 75 mg irbesartan - repeat echo in 3 months   Hypertension - BP during my exam was 188/102 - increasing BB and adding ARB as above    For questions or updates, please contact [facility] Please consult [email or web address] for contact info under        Signed, Ledora Bottcher, PA  03/07/2020, 8:57 AM     Attending Note:   The patient was seen and examined.  Agree with assessment and plan as noted above.  Changes made to the above note as needed.  Patient seen and independently examined with Doreene Adas, PA .   We discussed all aspects of the encounter. I agree with the assessment and plan as stated above.  1.   Out of hospital respiratory arrest:   Likely a comb of poly-substance abuse, pulmonary embolus,  Cath showed severe native CAD with patent LIMA to LAD, SVG to RCA but with a 99% stenosis in the native RCA beyond the anastomosis. S/p successful PCI to distal RCa I doubt that this lesion is the cause of his cardiopulmonary arrest      2. CAD :   S/p CABG - c/p PCI of his distal RCA this admission   3. Aortic root dilatation :   Will follow with [facility]  4.  Chronic combined CHF:   We have increased Coreg and added Irbesartan  Follow up with Dr. Harrington Challenger    I have spent a total of 40 minutes with patient reviewing hospital  notes , telemetry, EKGs, labs and examining patient as well as establishing an  assessment and plan that was discussed with the patient. > 50% of time was spent in direct patient care.  From a cardiac standpoint, he appears ready for DC  Will defer to Dr. Myra Gianotti for final DC plans. He will need to follow up with Dr. Harrington Challenger / APP in a month or so .  Follow up with his primary MD also   Thayer Headings, Brooke Bonito., MD, [facility] 03/07/2020, 11:29 AM [address]  [place] Pager [phone]

## 2020-03-07 NOTE — Progress Notes (Addendum)
Heart Failure Stewardship Pharmacist Progress Note   PCP: Dema Severin, NP PCP-Cardiologist: Dietrich Pates, MD    HPI:  65 yo M with PMH of CAD s/p CABG in 1992, CVA, HTN, DM2, CKD II-III, tobacco use, depression, and history of illicit drug use. He was admitted for cardiac vs respiratory arrest. CTA positive for PE. An ECHO was done on 03/02/20 and LVEF was mildly reduced to 45-50% (was 50-55% in August 2015).   Current HF Medications: Carvedilol 3.125 mg BID  Prior to admission HF Medications: None  Pertinent Lab Values: . Serum creatinine 0.94, BUN 10, Potassium 3.8, Sodium 133, BNP 52, Magnesium 1.7   Vital Signs: . Weight: 198 lbs (admission weight: 208 lbs) . Blood pressure: 150-170/80-90s  . Heart rate: 80s   Medication Assistance / Insurance Benefits Check: Does the patient have prescription insurance?  Yes Type of insurance plan: [employer]  Outpatient Pharmacy:  Prior to admission outpatient pharmacy: [facility] Is the patient willing to use [facility] TOC pharmacy at discharge? Yes   Assessment: 1. Chronic diastolic CHF (EF 16-01%), due to ICM. NYHA class II symptoms. - Continue carvedilol 6.25 mg BID - Consider starting Entresto 24/26 mg BID (indicated for LVEF <57%)  - Consider starting Jardiance 10 mg daily for both HFpEF and DM (A1c 11.3) prior to discharge   Plan: 1) Medication changes recommended at this time: - Start Entresto 24/26 mg BID  2) Patient assistance application(s): - None pending - Eliqiuis copay $4 per month - Entresto copay $4 per month  3)  Education  - To be completed prior to discharge  Sharen Hones, PharmD, BCPS Heart Failure Engineer, building services Phone [phone]

## 2020-03-07 NOTE — Progress Notes (Signed)
CARDIAC REHAB PHASE I   PRE:  Rate/Rhythm: 92 SR    BP: sitting 159/100    SaO2: 97 2L  MODE:  Ambulation: 100 ft   POST:  Rate/Rhythm: 102 ST    BP: sitting 159/82     SaO2: 89-92 RA  Pt asleep on arrival as he was this am. Agreeable to ambulate, "lets get it over with". Asked for RW today as he felt dizzy. Used RW, gait belt, assist x1. Somewhat unsteady, seemed to struggle with foot placement. No LOB. C/o dizziness and SOB. To recliner. Pt cleaned up, left on RA in recliner on chair alarm.   Discussed some ed. He has his stent card. Declined MI book or other written materials (smoking cessation, diet, exercise). Discussed importance of Plavix and he agreed. Encouraged increased activity/exercise. Will refer to [place] CRPII although I am not sure he will do. He does st he wants to try to quit smoking. Encouraged nicotine patch and distractions when he has mental cravings.   [id number]  Harriet Masson CES, ACSM 03/07/2020 1:52 PM

## 2020-03-07 NOTE — Care Management Important Message (Signed)
Important Message  Patient Details  Name: [name] MRN: [id number] Date of Birth: [birth year]   Medicare Important Message Given:  Yes     Renie Ora 03/07/2020, 10:01 AM

## 2020-03-07 NOTE — Progress Notes (Signed)
Physical Therapy Treatment Patient Details Name: [name] MRN: [id number] DOB: [birth year] Today's Date: 03/07/2020    History of Present Illness 65 yo with past medical history significant for essential hypertension, type 2 diabetes, prior CVA and chronic depression who was brought in by ems after witnessed arrest.    PT Comments    Pt has declined OOB even to chair and demonstrates a reduced tolerance for any movement.  However, did agree to exercise on bed with PT assisting.  [name] is complaining of chest pain from CPR, but describes as central sternal pain.  Pt is downplaying the event when nursing arrives to room and will therefore continue on with PT as ordered, work on his LE strength and control of standing and transfers.     Follow Up Recommendations  Home health PT;Supervision - Intermittent     Equipment Recommendations  None recommended by PT    Recommendations for Other Services       Precautions / Restrictions Precautions Precautions: Fall Precaution Comments: external defibrilator Restrictions Weight Bearing Restrictions: No Other Position/Activity Restrictions: sternal chect pain, external defibrillator in place.    Mobility  Bed Mobility               General bed mobility comments: declined OOB  Transfers                    Ambulation/Gait                 Stairs             Wheelchair Mobility    Modified Rankin (Stroke Patients Only)       Balance                                            Cognition Arousal/Alertness: Awake/alert Behavior During Therapy: Anxious Overall Cognitive Status: No family/caregiver present to determine baseline cognitive functioning                                 General Comments: reports pain in chest but when nursing came in right after PT was told pain not an issue      Exercises General Exercises - Lower Extremity Ankle Circles/Pumps:  AAROM;5 reps Gluteal Sets: AAROM;10 reps Heel Slides: AAROM;10 reps Hip ABduction/ADduction: AAROM;10 reps Straight Leg Raises: AAROM;10 reps Hip Flexion/Marching: AAROM;10 reps    General Comments General comments (skin integrity, edema, etc.): Pt was reading 95% on O2 sats, moving well on LE's but declined OOB to walk due to chest pain and fatigue      Pertinent Vitals/Pain Pain Assessment: Faces Pain Score: 2  Pain Location: sternal area from compressions. Pain Descriptors / Indicators: Guarding;Grimacing Pain Intervention(s): Limited activity within patient's tolerance;Monitored during session;Premedicated before session;Repositioned    Home Living                      Prior Function            PT Goals (current goals can now be found in the care plan section) Acute Rehab PT Goals Patient Stated Goal: reduction of chest pain    Frequency    Min 3X/week      PT Plan Current plan remains appropriate    Co-evaluation  AM-PAC PT "6 Clicks" Mobility   Outcome Measure  Help needed turning from your back to your side while in a flat bed without using bedrails?: None Help needed moving from lying on your back to sitting on the side of a flat bed without using bedrails?: None Help needed moving to and from a bed to a chair (including a wheelchair)?: None Help needed standing up from a chair using your arms (e.g., wheelchair or bedside chair)?: A Little Help needed to walk in hospital room?: A Little   6 Click Score: 18    End of Session   Activity Tolerance: Patient tolerated treatment well Patient left: with call bell/phone within reach;with chair alarm set;in chair Nurse Communication: Mobility status PT Visit Diagnosis: Muscle weakness (generalized) (M62.81);Other abnormalities of gait and mobility (R26.89)     Time: 1950-9326 PT Time Calculation (min) (ACUTE ONLY): 16 min  Charges:  $Therapeutic Exercise: 8-22 mins                  Ivar Drape 03/07/2020, 6:47 PM  Samul Dada, PT MS Acute Rehab Dept. Number: [facility] [id number] and [facility] [phone]

## 2020-03-07 NOTE — Progress Notes (Signed)
PROGRESS NOTE    Ian JubaHugh Donald [name]  ZOX:[id number] DOB: March 06, 1955 DOA: 03/02/2020 PCP: Dema SeverinYork, Regina F, NP   Brief Narrative:  The patient is a 65 yo with past medical history significant for essential hypertension, type 2 diabetes, prior CVA and chronic depression who was brought in by ems after witnessed arrest. Pt was with family when he "suddenly locked up" and fell down. cpr was initiated immediately. When ems arrives pt was agonal with respirations but strong carotid pulse. King airway was placed along with IO in [place] and [facility]  There was a report of a white powder substance near the patient where he was found down.  Patient achieved ROSC without shock or ACLS medications per EMS.  He was unresponsive and sedated on vent.  Admitted to the ICU.  Extubated to nasal cannula on 03/02/2020.  [facility]  Status post heart cath on 03/05/2020 by Dr. Okey DupreEnd which showed 99% lesion distal to the SVG, also moderate-severe lesions in the circumflex.  Status post PCI to distal RCA with residual disease to be treated medically.  He is currently on aspirin 81 mg daily, Brilinta 90 mg twice daily and Crestor 20 mg daily.  Hospital course complicated by mild hemoptysis in the setting of full dose Lovenox twice daily.  Hemoglobin has remained stable.  CTA PE done on 03/06/2018 positive right pulmonary embolism.  Started on heparin drip x48 hours.    Patient was reportedly on low-dose Xarelto as outpatient for history of stroke but has not been taking it for quite some time, at least 6 months.    **Interim History He is improving and Cardiology recommending triple Therapy for 1 month. Will wean off of O2 and obtain PT/OT evaluation.   Assessment & Plan:   Active Problems:   Cardiac arrest (HCC)   Non-ST elevation (NSTEMI) myocardial infarction [facility](HCC)   Coronary artery disease involving native coronary artery of native heart without angina pectoris   Acute  pulmonary embolism without acute cor pulmonale (HCC)   Acute hypoxic and hypercarbic respiratory failure and Respiratory Arrest in the [place] of right-sided pulmonary embolism diagnosed on 03/06/2020. And Polysubstance Abuse -Initially intubated due to cardiac arrest and unresponsiveness -Extubated on 03/02/2020 -He is currently requiring 3 L to maintain O2 saturation greater than 90% -Wean off oxygen supplementation as tolerated -Personally reviewed chest x-ray done on 03/02/2020 showing atelectasis at the bases bilaterally.  No evidence of pulmonary edema or lobular infiltrates. -New onset hemoptysis and increase in oxygen demand, was on full dose subcu Lovenox until 03/05/2020.  -CTA PE positive for pulmonary embolism, right-sided, no evidence of right heart strain on CT.  -Started on heparin drip x48 hours (Day 2 today), then switch to Eliquis.   -SpO2: 93 % O2 Flow Rate (L/min): 3 L/min FiO2 (%): 40 % -PT/OT to evaluate and Treat; Will need an Ambulatory Home O2 Screen prior to D/C  -Repeat CXR in the AM   Newly diagnosed pulmonary embolism, suspect present on admission. -Monitor hemoptysis, onset 03/04/20, improved after sq lovenox dose reduced to once a day by cardiology post cath.  Monitor H&H. -Management as stated above -Obtained bilateral lower extremity Doppler ultrasound to rule out DVT to r/o DVT as source for pulmonary embolism and was Negative -TOC consult to check for insurance coverage of Eliquis  Coronary artery disease status post CABG and successful PCI to Distal RCA NSTEMI -Status post cardiac cath on 03/05/2020 which showed 99% lesion distal  to the SVG, moderate to severe lesions in the Cx, status post PCI to distal RCA with residual disease to be treated medically. -C/w Management per cardiology -Aspirin 81 mg daily, Brilinta 90 mg twice daily changed to Clopidogrel 75 mg po Daily and Crestor 20 mg daily. -Cath Showed "Severe native CAD with patent LIMA to LAD, SVG to  RCA but with a 99% stenosis in the native RCA beyond the anastomosis" -S/p successful PCI to distal RCA this Admission -Cardiology also increasing Carvedilol to 6.25 mg po BID and adding 75 mg Irbesartan -Defer to cardiology to adjust antiplatelets in the setting of heparin drip.  -Cardiology recommending Triple Therapy x1 Month then Discontinue ASA  History of CVA -Was started on full dose lovenox while in the ICU, reduced to chemical DVT prophylaxis dose by cardiology post heart cath.   -Patient was reportedly on low-dose Xarelto as outpatient for history of stroke but has not been taking it for quite some time, at least 6 months.   -He is currently on aspirin, now Clopidogrel and off of Brilanta, and Crestor, now on heparin drip.  Defer to cardiology to adjust antiplatelets in the setting of anticoagulation and will transition to po Eliquis in the AM   Elevated Troponin, possibly NSTEMI versus PE, suspect present on admission. -Presented after cardiac arrest -Troponin has peaked at 463 and trended down. -2D echo with reduced LVEF 45-50% -Continue aspirin, Coreg 3.125 mg twice daily, Crestor 20 mg daily; Now started on Clopidogrel 75 mg po Daily  -Continue close monitoring -Cardiology following and appreciate further evaluation and recc's  Cardiac arrest, unknown etiology /chest pain/pleuritic pain/newly diagnosed PE -Seen by Cardiology -2D echo done on 03/02/2020 revealing LVEF 45 to 50% with left ventricle global hypokinesis, grade 1 diastolic dysfunction -IV morphine as needed for severe pain  AKI -Baseline creatinine appears to be 0.8 with GFR 26 -Presented with creatinine of 1.55 with GFR of 50 -Patient's BUN/Cr is now 10/0.94 -Continued IV fluid in the setting of recent IV contrast x48 hours but reduced rate and will stop . -Monitor volume status while on IV fluid and will stop IVF now  -Avoid Nephrotoxic Medications, Contrast Dyes, Hypotension and Renally Adjust  medications -Continue to Monitor urine output -Repeat CMP in the AM   Improvement hypervolemic hyponatremia -Serum sodium 130> 131> 134 > 134 -Continue to Monitor and Trend -Repeat CMP in the AM   Acute on Chronic Combined Diastolic and Systolic CHF -Last 2D echo with findings as stated above, LVEF reduced from prior -Continue Coreg, and other cardiac medications as recommended by cardiology -Closely monitor volume status while on IV fluid and now will stop -Cardiology added Irbesartan 75 mg po Daily and Cardiology increased Carvedilol to 6.25 mg po BID; Cardiology deferring starting Entresto 24/26 BID for now and can discuss starting Entresto in the outpatient setting  -Continue strict I's and O's and daily weights -Patient is -2.514 Liters -Continue to Monitor for S/Sx of Volume Overload  Uncontrolled Diabetes Mellitus Type 2 with Hyperglycemia -Hemoglobin A1c 11.3 on 03/02/2020 -Continue Moderate Novolog SSI q4h -Continue to Monitor CBG's per Protocol -CBG's ranging from 105-161 -Consulted Diabetes Education Coordinator   Normocytic Anemia -Patient's Hgb/Hct 12.3/34.8 -> 12.1/36.1 -Check Anemia Panel in the AM  -Continue to Monitor for S/Sx of bleeding as he is on Triple Therapy; Currently no overt Bleeding noted but did have Hemoptysis  -Repeat CBC in the AM   Acute Toxic Metabolic Encephalopathy -Resolved -UDS + for amphetamine, benzodiazepine, and THC on  03/02/2020. -CT head neg  Tobacco Abuse -Smoking Cessation Counseling -C/w Nicotine 21 mg TD q24h  HLD -C/w Rosuvastatin 20 mg po qHS  Polysubstance Abuse -UDS + amphetamins, benzo and THC -CIWA protocol for history of benzodiazepine abuse -Polysubstance abuse counseling at bedside -No evidence of benzodiazepine withdrawal at the time of this visit.  Ascending thoracic aorta aneurysm measuring 4.5 x 4.3 cm Slightly enlarged from prior examination in 2019 -Ascending thoracic aortic aneurysm. -Recommend  semi-annual imaging followup by CTA or MRA and referral to cardiothoracic surgery if not already obtained. This recommendation follows 2010 ACCF/AHA/AATS/ACR/ASA/SCA/SCAI/SIR/STS/SVM Guidelines. -Provide referral to CTS vs VVS at discharge for follow up.  Minimally displaced fractures of the anterior left 6th rib, anterior right 2nd and 5th ribs likely due to resuscitation in the setting of cardiac arrest. -C/w Pain control with Hydrocodone-Acetaminophen 1 tab po q6hprn -Closely monitor  Obesity -Complicates overall prognosis and care -Estimated body mass index is 30.2 kg/m as calculated from the following:   Height as of this encounter: 5\' 8"  (1.727 m).   Weight as of this encounter: 90.1 kg. -Weight Loss and Dietary Counseling given   DVT prophylaxis: Heparin gtt  Code Status: FULL CODE  Family Communication: No family present at bedside  Disposition Plan: Pending further clinical improvement, weaning off of O2, and PT/OT evaluation  Status is: Inpatient  Remains inpatient appropriate because:Unsafe d/c plan, IV treatments appropriate due to intensity of illness or inability to take PO and Inpatient level of care appropriate due to severity of illness   Dispo:  Patient From: Home  Planned Disposition: Home with Health Care Svc  Expected discharge date: 03/08/2020  Medically stable for discharge: No  Consultants:   Cardiology  PCCM   Procedures:  Left heart cath 03/05/20: Conclusions: 1. Severe native coronary artery disease with chronic total occlusions of the ostial LAD and RCA as well as 99% stenosis of distal RCA that is supplied by SVG to RCA and multifocal moderate to severe disease disease involving the LCx and OM1. 2. Widely patent LIMA to LAD. 3. Widely patent SVG to distal RCA with 99% stenosis in the native RCA beyond the SVG anastomosis. 4. Upper normal left ventricular filling pressure. 5. Successful PCI to distal RCA through SVG to RCA using Resolute Onyx  2.5 x 12 mm drug-eluting stent (postdilated to 2.9 mm) with 0% residual stenosis and TIMI-3 flow. 6. Incidental note made of occluded proximal right superficial femoral artery on sheath angiogram.  Recommendations: 1. Dual antiplatelet therapy with aspirin and ticagrelor for at least 12 months. 2. Aggressive secondary prevention. 3. Favor medical therapy for LCx/OM disease. 4. Optimize goal-directed medical therapy for mildly reduced LVEF. 5. Remove right femoral artery sheath with manual compression 2 hours after discontinuation of bivalirudin at the end of the procedure. 6. Consider outpatient lower extremity noninvasive vascular testing.   Echo 03/02/20: 1. Left ventricular ejection fraction, by estimation, is 45 to 50%. The  left ventricle has mildly decreased function. The left ventricle  demonstrates global hypokinesis. There is mild left ventricular  hypertrophy. Left ventricular diastolic parameters  are consistent with Grade I diastolic dysfunction (impaired relaxation).  2. Right ventricular systolic function is mildly reduced. The right  ventricular size is normal. Tricuspid regurgitation signal is inadequate  for assessing PA pressure.  3. The mitral valve is normal in structure. No evidence of mitral valve  regurgitation. No evidence of mitral stenosis.  4. The aortic valve is tricuspid. Aortic valve regurgitation is not  visualized. No  aortic stenosis is present.  5. Aortic dilatation noted. There is mild dilatation of the aortic root,  measuring 42 mm.  6. The inferior vena cava is normal in size with greater than 50%  respiratory variability, suggesting right atrial pressure of 3 mmHg.   Antimicrobials: Anti-infectives (From admission, onward)   None       Subjective: Seen and examined at bedside and states he is feeling better and is breathing easier. No CP or SOB. Still has some discomfort. Slept ok. No lightheadedness or dizziness. No other concerns or  complaints at this time.   Objective: Vitals:   03/07/20 0435 03/07/20 0751 03/07/20 1136 03/07/20 1608  BP: (!) 156/92 (!) 176/106 (!) 166/95 (!) 176/100  Pulse: 94 87 84 92  Resp: 16 17 15 18   Temp: 98.3 F (36.8 C) 97.7 F (36.5 C)  97.6 F (36.4 C)  TempSrc: Oral Oral  Axillary  SpO2: 94% 94% 94% 93%  Weight: 90.1 kg     Height:        Intake/Output Summary (Last 24 hours) at 03/07/2020 1612 Last data filed at 03/07/2020 1500 Gross per 24 hour  Intake 240 ml  Output 2000 ml  Net -1760 ml   Filed Weights   03/05/20 0435 03/06/20 0429 03/07/20 0435  Weight: 91.7 kg 91.3 kg 90.1 kg   Examination: Physical Exam:  Constitutional: WN/WD obese Caucasian male in NAD and appears calm and comfortable Eyes: Lids and conjunctivae normal, sclerae anicteric  ENMT: External Ears, Nose appear normal. Grossly normal hearing.  Neck: Appears normal, supple, no cervical masses, normal ROM, no appreciable thyromegaly Respiratory: Diminished to auscultation bilaterally with coarse breath sounds, no wheezing, rales, rhonchi or crackles. Normal respiratory effort and patient is not tachypenic. No accessory muscle use. Wearing supplemental O2 via [place]  Cardiovascular: RRR, no murmurs / rubs / gallops. S1 and S2 auscultated. No carotid bruits.  Abdomen: Soft, non-tender, Distended 2/2 to body habitus. Bowel sounds positive.  GU: Deferred. Musculoskeletal: No clubbing / cyanosis of digits/nails. No joint deformity upper and lower extremities.  Skin: No rashes, lesions, ulcers on a limited skin evaluation. No induration; Warm and dry.  Neurologic: CN 2-12 grossly intact with no focal deficits. Romberg sign and cerebellar reflexes not assessed.  Psychiatric: Normal judgment and insight. Alert and oriented x 3. Normal mood and appropriate affect.   Data Reviewed: I have personally reviewed following labs and imaging studies  CBC: Recent Labs  Lab 03/02/20 0058 03/02/20 0433 03/03/20 0234  03/04/20 1336 03/06/20 0238 03/07/20 0426  WBC 9.6  --  11.9* 5.9 4.7 5.2  NEUTROABS 7.1  --   --   --   --   --   HGB 15.7 14.6 13.0 13.6 12.3* 12.1*  HCT 45.4 43.0 39.8 39.4 34.8* 36.1*  MCV 88.0  --  90.0 86.2 85.7 87.4  PLT 244  --  167 172 218 230   Basic Metabolic Panel: Recent Labs  Lab 03/02/20 0358 03/02/20 0433 03/03/20 0234 03/04/20 1012 03/04/20 1336 03/05/20 0221 03/06/20 0238 03/07/20 0426  NA 131*   < > 132* 130*  --  131* 134* 133*  K 4.7   < > 3.9 3.8  --  3.8 4.0 3.8  CL 97*  --  96* 95*  --  93* 95* 99  CO2 20*  --  25 25  --  26 27 22   GLUCOSE 383*  --  180* 164*  --  175* 157* 136*  BUN 14  --  18 12  --  15 13 10   CREATININE 1.31*  --  1.33* 0.97  --  0.95 1.02 0.94  CALCIUM 9.0  --  8.2* 8.7*  --  8.8* 8.7* 8.3*  MG 2.0  --   --   --  1.7  --   --   --   PHOS 4.8*  --   --   --  3.3  --   --   --    < > = values in this interval not displayed.   GFR: Estimated Creatinine Clearance: 88.9 mL/min (by C-G formula based on SCr of 0.94 mg/dL). Liver Function Tests: Recent Labs  Lab 03/02/20 0058 03/06/20 0238  AST 138* 16  ALT 99* 20  ALKPHOS 80 62  BILITOT 0.5 0.2*  PROT 6.4* 6.0*  ALBUMIN 3.2* 2.4*   No results for input(s): LIPASE, AMYLASE in the last 168 hours. No results for input(s): AMMONIA in the last 168 hours. Coagulation Profile: No results for input(s): INR, PROTIME in the last 168 hours. Cardiac Enzymes: No results for input(s): CKTOTAL, CKMB, CKMBINDEX, TROPONINI in the last 168 hours. BNP (last 3 results) No results for input(s): PROBNP in the last 8760 hours. HbA1C: No results for input(s): HGBA1C in the last 72 hours. CBG: Recent Labs  Lab 03/07/20 0013 03/07/20 0432 03/07/20 0749 03/07/20 1134 03/07/20 1606  GLUCAP 105* 135* 130* 133* 154*   Lipid Profile: Recent Labs    03/06/20 0238  CHOL 131  HDL 27*  LDLCALC 81  TRIG 05/04/20  CHOLHDL 4.9   Thyroid Function Tests: No results for input(s): TSH, T4TOTAL,  FREET4, T3FREE, THYROIDAB in the last 72 hours. Anemia Panel: No results for input(s): VITAMINB12, FOLATE, FERRITIN, TIBC, IRON, RETICCTPCT in the last 72 hours. Sepsis Labs: Recent Labs  Lab 03/02/20 0058 03/02/20 0433 03/02/20 1442  LATICACIDVEN 3.2* 2.7* 1.2    Recent Results (from the past 240 hour(s))  Resp Panel by RT-PCR (Flu A&B, Covid) Nasopharyngeal Swab     Status: None   Collection Time: 03/02/20 12:59 AM   Specimen: Nasopharyngeal Swab; Nasopharyngeal(NP) swabs in vial transport medium  Result Value Ref Range Status   SARS Coronavirus 2 by RT PCR NEGATIVE NEGATIVE Final    Comment: (NOTE) SARS-CoV-2 target nucleic acids are NOT DETECTED.  The SARS-CoV-2 RNA is generally detectable in upper respiratory specimens during the acute phase of infection. The lowest concentration of SARS-CoV-2 viral copies this assay can detect is 138 copies/mL. A negative result does not preclude SARS-Cov-2 infection and should not be used as the sole basis for treatment or other patient management decisions. A negative result may occur with  improper specimen collection/handling, submission of specimen other than nasopharyngeal swab, presence of viral mutation(s) within the areas targeted by this assay, and inadequate number of viral copies(<138 copies/mL). A negative result must be combined with clinical observations, patient history, and epidemiological information. The expected result is Negative.  Fact Sheet for Patients:  04/30/20  Fact Sheet for Healthcare Providers:  BloggerCourse.com  This test is no t yet approved or cleared by the [place] FDA and  has been authorized for detection and/or diagnosis of SARS-CoV-2 by FDA under an Emergency Use Authorization (EUA). This EUA will remain  in effect (meaning this test can be used) for the duration of the COVID-19 declaration under Section 564(b)(1) of the Act,  21 U.S.C.section 360bbb-3(b)(1), unless the authorization is terminated  or revoked sooner.       Influenza A by  PCR NEGATIVE NEGATIVE Final   Influenza B by PCR NEGATIVE NEGATIVE Final    Comment: (NOTE) The Xpert Xpress SARS-CoV-2/FLU/RSV plus assay is intended as an aid in the diagnosis of influenza from Nasopharyngeal swab specimens and should not be used as a sole basis for treatment. Nasal washings and aspirates are unacceptable for Xpert Xpress SARS-CoV-2/FLU/RSV testing.  Fact Sheet for Patients: BloggerCourse.com  Fact Sheet for Healthcare Providers: SeriousBroker.it  This test is not yet approved or cleared by the [place] FDA and has been authorized for detection and/or diagnosis of SARS-CoV-2 by FDA under an Emergency Use Authorization (EUA). This EUA will remain in effect (meaning this test can be used) for the duration of the COVID-19 declaration under Section 564(b)(1) of the Act, 21 U.S.C. section 360bbb-3(b)(1), unless the authorization is terminated or revoked.  Performed at [facility], [address] [place], [place] [address]     RN Pressure Injury Documentation:     Estimated body mass index is 30.2 kg/m as calculated from the following:   Height as of this encounter: 5\' 8"  (1.727 m).   Weight as of this encounter: 90.1 kg.  Malnutrition Type:   Malnutrition Characteristics:   Nutrition Interventions:    Radiology Studies: CT ANGIO CHEST PE W OR WO CONTRAST  Result Date: 03/06/2020 CLINICAL DATA:  PE suspected, status post cardiac arrest, ongoing chest pain and shortness of breath, oxygen requirement EXAM: CT ANGIOGRAPHY CHEST WITH CONTRAST TECHNIQUE: Multidetector CT imaging of the chest was performed using the standard protocol during bolus administration of intravenous contrast. Multiplanar CT image reconstructions and MIPs were obtained to evaluate the vascular anatomy. CONTRAST:   57mL OMNIPAQUE IOHEXOL 350 MG/ML SOLN COMPARISON:  09/10/2007 FINDINGS: Cardiovascular: Satisfactory opacification of the pulmonary arteries to the segmental level. Positive examination for pulmonary embolism with embolus present in the distal right pulmonary artery and lobar to segmental branches of the right lung (series 6, image 136). There is no significant embolus noted in the left lung. Mild, global cardiomegaly. Three-vessel coronary artery calcifications. The RV LV ratio is preserved, less than 0.8. The main pulmonary artery is normal in caliber measuring 2.9 cm. Enlargement of the tubular ascending thoracic aorta, measuring up to 4.5 x 4.3 cm on this non tailored examination. Aortic atherosclerosis. No pericardial effusion. Mediastinum/Nodes: No enlarged mediastinal, hilar, or axillary lymph nodes. Thyroid gland, trachea, and esophagus demonstrate no significant findings. Lungs/Pleura: Small bilateral pleural effusions and associated atelectasis or consolidation. Upper Abdomen: No acute abnormality. Musculoskeletal: No chest wall abnormality. Minimally displaced fracture of the anterior left sixth rib (series 5, image 84) as well as the anterior right second and fifth ribs (series 5, image 33). Review of the MIP images confirms the above findings. IMPRESSION: 1. Positive examination for pulmonary embolism with embolus present in the distal right pulmonary artery and lobar to segmental branches of the right lung. There is no significant embolus noted in the left lung. 2. Mild, global cardiomegaly. The RV LV ratio is preserved. The main pulmonary artery is normal in caliber. 3. Small bilateral pleural effusions and associated atelectasis or consolidation. 4. Coronary artery disease. 5. Enlargement of the tubular ascending thoracic aorta, measuring up to 4.5 x 4.3 cm on this non tailored examination. This is slightly enlarged compared to prior examination dated 09/09/2017 at which time it measured 4.2 x 4.1  cm. Ascending thoracic aortic aneurysm. Recommend semi-annual imaging followup by CTA or MRA and referral to cardiothoracic surgery if not already obtained. This recommendation follows  2010 ACCF/AHA/AATS/ACR/ASA/SCA/SCAI/SIR/STS/SVM Guidelines for the Diagnosis and Management of Patients With Thoracic Aortic Disease. Circulation. 2010; 121: Z610-R604. Aortic aneurysm NOS (ICD10-I71.9) 6. Minimally displaced fractures of the anterior left sixth rib as well as the anterior right second and fifth ribs, likely due to resuscitation in the reported setting of cardiac arrest. Aortic Atherosclerosis (ICD10-I70.0). These results were called by telephone at the time of interpretation on 03/06/2020 at 9:37 am to Dr. Dow Adolph , who verbally acknowledged these results. Electronically Signed   By: Lauralyn Primes M.D.   On: 03/06/2020 09:38   VAS [place] LOWER EXTREMITY VENOUS (DVT)  Result Date: 03/06/2020  Lower Venous DVT Study Indications: Pulmonary embolism.  Comparison Study: 04/23/19 previous Performing Technologist: Blanch Media RVS  Examination Guidelines: A complete evaluation includes B-mode imaging, spectral Doppler, color Doppler, and power Doppler as needed of all accessible portions of each vessel. Bilateral testing is considered an integral part of a complete examination. Limited examinations for reoccurring indications may be performed as noted. The reflux portion of the exam is performed with the patient in reverse Trendelenburg.  +---------+---------------+---------+-----------+----------+--------------+ RIGHT    CompressibilityPhasicitySpontaneityPropertiesThrombus Aging +---------+---------------+---------+-----------+----------+--------------+ CFV      Full           Yes      Yes                                 +---------+---------------+---------+-----------+----------+--------------+ SFJ      Full                                                         +---------+---------------+---------+-----------+----------+--------------+ FV Prox  Full                                                        +---------+---------------+---------+-----------+----------+--------------+ FV Mid   Full                                                        +---------+---------------+---------+-----------+----------+--------------+ FV DistalFull                                                        +---------+---------------+---------+-----------+----------+--------------+ PFV      Full                                                        +---------+---------------+---------+-----------+----------+--------------+ POP      Full           Yes      Yes                                 +---------+---------------+---------+-----------+----------+--------------+  PTV      Full                                                        +---------+---------------+---------+-----------+----------+--------------+ PERO     Full                                                        +---------+---------------+---------+-----------+----------+--------------+   +---------+---------------+---------+-----------+----------+--------------+ LEFT     CompressibilityPhasicitySpontaneityPropertiesThrombus Aging +---------+---------------+---------+-----------+----------+--------------+ CFV      Full           Yes      Yes                                 +---------+---------------+---------+-----------+----------+--------------+ SFJ      Full                                                        +---------+---------------+---------+-----------+----------+--------------+ FV Prox  Full                                                        +---------+---------------+---------+-----------+----------+--------------+ FV Mid   Full                                                         +---------+---------------+---------+-----------+----------+--------------+ FV DistalFull                                                        +---------+---------------+---------+-----------+----------+--------------+ PFV      Full                                                        +---------+---------------+---------+-----------+----------+--------------+ POP      Full           Yes      Yes                                 +---------+---------------+---------+-----------+----------+--------------+ PTV      Full                                                        +---------+---------------+---------+-----------+----------+--------------+  PERO     Full                                                        +---------+---------------+---------+-----------+----------+--------------+     Summary: BILATERAL: - No evidence of deep vein thrombosis seen in the lower extremities, bilaterally. - No evidence of superficial venous thrombosis in the lower extremities, bilaterally. -No evidence of popliteal cyst, bilaterally.   *See table(s) above for measurements and observations. Electronically signed by Waverly Ferrarihristopher Dickson MD on 03/06/2020 at 2:19:49 PM.    Final     Scheduled Meds: . aspirin  81 mg Oral Daily  . carvedilol  3.125 mg Oral BID WC  . clopidogrel  75 mg Oral Daily  . dextromethorphan-guaiFENesin  2 tablet Oral BID  . docusate sodium  100 mg Oral BID  . famotidine  20 mg Oral BID  . folic acid  1 mg Oral Daily  .  HYDROmorphone (DILAUDID) injection  0.5 mg Intravenous Once  . insulin aspart  0-15 Units Subcutaneous Q4H  . lidocaine  1 application Urethral Once  . multivitamin with minerals  1 tablet Oral Daily  . nicotine  21 mg Transdermal Daily  . polyethylene glycol  17 g Oral Daily  . rosuvastatin  20 mg Oral QHS  . sacubitril-valsartan  1 tablet Oral BID  . sodium chloride flush  3 mL Intravenous Q12H  . tamsulosin  0.4 mg Oral QPC supper  .  thiamine  100 mg Oral Daily   Or  . thiamine  100 mg Intravenous Daily   Continuous Infusions: . sodium chloride    . sodium chloride 75 mL/hr at 03/07/20 0832  . heparin 2,000 Units/hr (03/07/20 1426)    LOS: 5 days   Merlene Laughtermair Latif Kalena Mander, DO [facility] PAGER is on AMION  If 7PM-7AM, please contact night-coverage [email or web address]

## 2020-03-07 NOTE — Progress Notes (Signed)
ANTICOAGULATION CONSULT NOTE - Follow Up Consult  Pharmacy Consult for heparin Indication: pulmonary embolus  Labs: Recent Labs    03/04/20 1336 03/05/20 0221 03/06/20 0238 03/06/20 2015 03/07/20 0426  HGB 13.6  --  12.3*  --   --   HCT 39.4  --  34.8*  --   --   PLT 172  --  218  --   --   HEPARINUNFRC  --   --   --  0.25* 0.25*  CREATININE  --  0.95 1.02  --  0.94    Assessment: 64yo male remains subtherapeutic on heparin with no change in heparin level despite rate change, possibly due to PTA Xarelto clearing; no gtt issues or signs of bleeding per RN.  Goal of Therapy:  Heparin level 0.3-0.7 units/ml   Plan:  Will increase heparin gtt by 2-3 units/kg/hr to 1900 units/hr and check level in 6 hours.    Vernard Gambles, PharmD, BCPS  03/07/2020,5:40 AM

## 2020-03-07 NOTE — Progress Notes (Signed)
CSW received consult for substance use resources. CSW spoke with patient at bedside. CSW offered patient outpatient substance use treatment services resources. Patient accepted.  CSW will continue to follow.

## 2020-03-07 NOTE — Progress Notes (Signed)
Heart Failure Nurse Navigator Progress Note  PCP: Dema Severin, NP PCP-Cardiologist: Dietrich Pates, MD Admission Diagnosis: Resp arrest vs cardiac arrest Admitted from: Home by self  Presentation:   Peggyann Juba presented with cardiac vs respiratory arrest. CTA positive for PE. An ECHO was done on 03/02/20 and LVEF was mildly reduced to 45-50% (was 50-55% in August 2015).  ECHO/ LVEF: 45-50%, 09/2013 50-55%  Clinical Course:  Past Medical History:  Diagnosis Date  . Depression   . Diabetes mellitus without complication (HCC)   . Hypertension   . Stroke [facility])      Current smoker-not ready to quit. Educated and encouraged cessation. States he smokes marijuana sometimes. Tox screen positive for amphetamines--pt "does not know how that happened" as he "does not remember taking anything like that" "used to use cocaine many years ago", not currently using per pt statement. Pt not ready to quit marijuana- educated and encouraged cessation. List of substance abuse programs on bedside table, pt stated "no one talked to him about it" also "don't need that".  Social Determinants of Health    Financial Resource Strain: yes, SSI $900ish per month. Rent $300 + bills  Food Insecurity: yes, has gone without food d/t financial strain.  Transportation Needs: no, utilizes transportation service outside of [employer]  Physical Activity: not on file  Stress: moderate, financial insecurity, low support system  Social Connections: lives alone. Has friends he talks with but are "not good influences"    High Risk Criteria for Readmission and/or Poor Patient Outcomes:  Heart failure hospital admissions (last 6 months): 1   No Show rate: 27%  Difficult social situation: yes  Demonstrates medication adherence: no, "forgets to take sometimes", has bubble packs at home  Primary Language: English  Literacy level: concern for retaining information and comprehension/cognitive ability or  willingness to learn.   Barriers of Care:   Understanding importance of medication regimen. Lack of social support. Transportation.  Substance abuse. Financial strain.  Considerations/Referrals:   Referral made to Heart Failure Pharmacist Stewardship: yes, appreciated Referral made to Heart Impact Team RNCM/CSW: yes  Will continue to follow through DC.  Ozella Rocks, RN, BSN Heart Failure Nurse Navigator [facility] [phone]

## 2020-03-08 ENCOUNTER — Inpatient Hospital Stay (HOSPITAL_COMMUNITY): Payer: Medicare Other

## 2020-03-08 DIAGNOSIS — I214 Non-ST elevation (NSTEMI) myocardial infarction: Secondary | ICD-10-CM | POA: Diagnosis not present

## 2020-03-08 DIAGNOSIS — I2699 Other pulmonary embolism without acute cor pulmonale: Secondary | ICD-10-CM | POA: Diagnosis not present

## 2020-03-08 DIAGNOSIS — I469 Cardiac arrest, cause unspecified: Secondary | ICD-10-CM | POA: Diagnosis not present

## 2020-03-08 DIAGNOSIS — I2510 Atherosclerotic heart disease of native coronary artery without angina pectoris: Secondary | ICD-10-CM | POA: Diagnosis not present

## 2020-03-08 LAB — VITAMIN B12: Vitamin B-12: 413 pg/mL (ref 180–914)

## 2020-03-08 LAB — BASIC METABOLIC PANEL
Anion gap: 12 (ref 5–15)
BUN: 11 mg/dL (ref 8–23)
CO2: 23 mmol/L (ref 22–32)
Calcium: 8.5 mg/dL — ABNORMAL LOW (ref 8.9–10.3)
Chloride: 99 mmol/L (ref 98–111)
Creatinine, Ser: 0.92 mg/dL (ref 0.61–1.24)
GFR, Estimated: >60 mL/min (ref >60)
Glucose, Bld: 125 mg/dL — ABNORMAL HIGH (ref 70–99)
Potassium: 3.7 mmol/L (ref 3.5–5.1)
Sodium: 134 mmol/L — ABNORMAL LOW (ref 135–145)

## 2020-03-08 LAB — CBC
HCT: 37.8 % — ABNORMAL LOW (ref 39.0–52.0)
Hemoglobin: 12.8 g/dL — ABNORMAL LOW (ref 13.0–17.0)
MCH: 29.1 pg (ref 26.0–34.0)
MCHC: 33.9 g/dL (ref 30.0–36.0)
MCV: 85.9 fL (ref 80.0–100.0)
Platelets: 265 10*3/uL (ref 150–400)
RBC: 4.4 MIL/uL (ref 4.22–5.81)
RDW: 12.5 % (ref 11.5–15.5)
WBC: 5.8 10*3/uL (ref 4.0–10.5)
nRBC: 0 % (ref 0.0–0.2)

## 2020-03-08 LAB — IRON AND TIBC
Iron: 59 ug/dL (ref 45–182)
Saturation Ratios: 20 % (ref 17.9–39.5)
TIBC: 302 ug/dL (ref 250–450)
UIBC: 243 ug/dL

## 2020-03-08 LAB — GLUCOSE, CAPILLARY
Glucose-Capillary: 112 mg/dL — ABNORMAL HIGH (ref 70–99)
Glucose-Capillary: 127 mg/dL — ABNORMAL HIGH (ref 70–99)
Glucose-Capillary: 128 mg/dL — ABNORMAL HIGH (ref 70–99)
Glucose-Capillary: 145 mg/dL — ABNORMAL HIGH (ref 70–99)
Glucose-Capillary: 148 mg/dL — ABNORMAL HIGH (ref 70–99)
Glucose-Capillary: 157 mg/dL — ABNORMAL HIGH (ref 70–99)
Glucose-Capillary: 212 mg/dL — ABNORMAL HIGH (ref 70–99)

## 2020-03-08 LAB — FOLATE: Folate: 12.9 ng/mL (ref >5.9)

## 2020-03-08 LAB — HEPARIN LEVEL (UNFRACTIONATED): Heparin Unfractionated: 0.46 IU/mL (ref 0.30–0.70)

## 2020-03-08 LAB — FERRITIN: Ferritin: 364 ng/mL — ABNORMAL HIGH (ref 24–336)

## 2020-03-08 LAB — RETICULOCYTES
Immature Retic Fract: 23.8 % — ABNORMAL HIGH (ref 2.3–15.9)
RBC.: 4.96 MIL/uL (ref 4.22–5.81)
Retic Count, Absolute: 99.2 10*3/uL (ref 19.0–186.0)
Retic Ct Pct: 2 % (ref 0.4–3.1)

## 2020-03-08 MED ORDER — CARVEDILOL 6.25 MG PO TABS
6.2500 mg | ORAL_TABLET | Freq: Two times a day (BID) | ORAL | Status: DC
  Administered 2020-03-08 – 2020-03-09 (×2): 6.25 mg via ORAL
  Filled 2020-03-08 (×2): qty 1

## 2020-03-08 MED ORDER — INSULIN ASPART 100 UNIT/ML ~~LOC~~ SOLN: 0.0000 [IU] | Freq: Every day | SUBCUTANEOUS | Status: DC

## 2020-03-08 MED ORDER — CARVEDILOL 6.25 MG PO TABS: 6.2500 mg | ORAL_TABLET | Freq: Two times a day (BID) | ORAL | Status: DC

## 2020-03-08 MED ORDER — CARVEDILOL 3.125 MG PO TABS
3.1250 mg | ORAL_TABLET | Freq: Once | ORAL | Status: AC
  Administered 2020-03-08: 3.125 mg via ORAL
  Filled 2020-03-08: qty 1

## 2020-03-08 MED ORDER — APIXABAN 5 MG PO TABS
10.0000 mg | ORAL_TABLET | Freq: Two times a day (BID) | ORAL | Status: DC
  Administered 2020-03-08 – 2020-03-09 (×2): 10 mg via ORAL
  Filled 2020-03-08 (×2): qty 2

## 2020-03-08 MED ORDER — INSULIN ASPART 100 UNIT/ML ~~LOC~~ SOLN
0.0000 [IU] | Freq: Three times a day (TID) | SUBCUTANEOUS | Status: DC
  Administered 2020-03-09: 3 [IU] via SUBCUTANEOUS

## 2020-03-08 MED ORDER — APIXABAN 5 MG PO TABS: 5.0000 mg | ORAL_TABLET | Freq: Two times a day (BID) | ORAL | Status: DC

## 2020-03-08 MED ORDER — CARVEDILOL 3.125 MG PO TABS: 3.1250 mg | ORAL_TABLET | Freq: Once | ORAL | Status: DC

## 2020-03-08 NOTE — Progress Notes (Signed)
PROGRESS NOTE    Alexa Golebiewski  ZOX:[id number] DOB: 1956-01-02 DOA: 03/02/2020 PCP: Dema Severin, NP   Brief Narrative:  The patient is a 65 yo with past medical history significant for essential hypertension, type 2 diabetes, prior CVA and chronic depression who was brought in by ems after witnessed arrest. Pt was with family when he "suddenly locked up" and fell down. cpr was initiated immediately. When ems arrives pt was agonal with respirations but strong carotid pulse. King airway was placed along with IO in [place] and he was transported to [facility].  There was a report of a white powder substance near the patient where he was found down.  Patient achieved ROSC without shock or ACLS medications per EMS.  He was unresponsive and sedated on vent.  Admitted to the ICU.  Extubated to nasal cannula on 03/02/2020.  Transferred to [facility] service on 03/03/2020.  Status post heart cath on 03/05/2020 by Dr. Okey Dupre which showed 99% lesion distal to the SVG, also moderate-severe lesions in the circumflex.  Status post PCI to distal RCA with residual disease to be treated medically.  He is currently on aspirin 81 mg daily, Brilinta 90 mg twice daily and Crestor 20 mg daily.  Hospital course complicated by mild hemoptysis in the setting of full dose Lovenox twice daily.  Hemoglobin has remained stable.  CTA PE done on 03/06/2018 positive right pulmonary embolism.  Started on heparin drip x48 hours.    Patient was reportedly on low-dose Xarelto as outpatient for history of stroke but has not been taking it for quite some time, at least 6 months.    **Interim History He is improving and Cardiology recommending triple Therapy for 1 month. Will wean off of O2 and obtain PT/OT evaluation.  PT and OT are recommending home health with intermittent supervision.  We will wean him off the heparin drip to Eliquis p.o. today and monitor him closely as he is still having some Hemoptysis. Will repeat A CXR in the AM. He was  weaned off of Supplemental O2 via [place]   Assessment & Plan:   Active Problems:   Cardiac arrest (HCC)   Non-ST elevation (NSTEMI) myocardial infarction [facility])   Coronary artery disease involving native coronary artery of native heart without angina pectoris   Acute pulmonary embolism without acute cor pulmonale (HCC)   Acute hypoxic and hypercarbic respiratory failure and Respiratory Arrest in the Set[place] of right-sided pulmonary embolism diagnosed on 03/06/2020. And Polysubstance Abuse -Initially intubated due to cardiac arrest and unresponsiveness -Extubated on 03/02/2020 -He is currently requiring 3 L to maintain O2 saturation greater than 90% -Wean off oxygen supplementation as tolerated -Personally reviewed chest x-ray done on 03/02/2020 showing atelectasis at the bases bilaterally.  No evidence of pulmonary edema or lobular infiltrates. -New onset hemoptysis and increase in oxygen demand, was on full dose subcu Lovenox until 03/05/2020.  -CTA PE positive for pulmonary embolism, right-sided, no evidence of right heart strain on CT.  -Started on heparin drip x48 hour and will switch to Eliquis today. He is having some Hemoptysis so will need to watch carefully on Eliquis    -SpO2: 95 % O2 Flow Rate (L/min): 3 L/min FiO2 (%): 40 %; Was weaned off of Supplemental O2 this AM -PT/OT to evaluate and Treat; Will need an Ambulatory Home O2 Screen prior to D/C and will need to be documented -Repeat CXR in the AM; CXR this AM showed "Probable skin fold projects over the periphery of the  right lung. Pneumothorax significantly less favored however if there is persisting clinical concern, patient could be repositioned and reimaged portably or an AP and lateral radiograph could be obtained. 2. Previously seen pleural effusions are not well visualized on this radiograph. 3. Streaky opacities with a mid to lower lung predominance are favored to reflect some persistent atelectatic changes." -Continue to Monitor  His Hemoptysis as he still has Some   Newly diagnosed pulmonary embolism, suspect present on admission. -Monitor hemoptysis, onset 03/04/20, improved after sq lovenox dose reduced to once a day by cardiology post cath.  Monitor H&H. -Management as stated above -Obtained bilateral lower extremity Doppler ultrasound to rule out DVT to r/o DVT as source for pulmonary embolism and was Negative -TOC consult to check for insurance coverage of Eliquis; Will transition him to po Eliquis today   Coronary artery disease status post CABG and successful PCI to Distal RCA NSTEMI -Status post cardiac cath on 03/05/2020 which showed 99% lesion distal to the SVG, moderate to severe lesions in the Cx, status post PCI to distal RCA with residual disease to be treated medically. -C/w Management per cardiology -Aspirin 81 mg daily, Brilinta 90 mg twice daily changed to Clopidogrel 75 mg po Daily and Crestor 20 mg daily. -Cath Showed "Severe native CAD with patent LIMA to LAD, SVG to RCA but with a 99% stenosis in the native RCA beyond the anastomosis" -S/p successful PCI to distal RCA this Admission -Cardiology also increasing Carvedilol to 6.25 mg po BID and adding 75 mg Irbesartan -Defer to cardiology to adjust antiplatelets in the setting of heparin drip.  -Cardiology recommending Triple Therapy x1 Month then Discontinue ASA; Monitor Hgb and Hemoptysis on Eliquis, ASA and Plavix  History of CVA -Was started on full dose lovenox while in the ICU, reduced to chemical DVT prophylaxis dose by cardiology post heart cath.   -Patient was reportedly on low-dose Xarelto as outpatient for history of stroke but has not been taking it for quite some time, at least 6 months.   -He is currently on aspirin, now Clopidogrel and off of Brilanta, and Crestor, now on heparin drip.  Defer to cardiology to adjust antiplatelets in the setting of anticoagulation and will transition to po Eliquis in the AM   Elevated Troponin,  possibly NSTEMI versus PE, suspect present on admission. -Presented after cardiac arrest -Troponin has peaked at 463 and trended down. -2D echo with reduced LVEF 45-50% -Continue aspirin, Coreg 3.125 mg twice daily, Crestor 20 mg daily; Now started on Clopidogrel 75 mg po Daily  -Continue close monitoring -Cardiology following and appreciate further evaluation and recc's  Cardiac arrest, unknown etiology /chest pain/pleuritic pain/newly diagnosed PE -Seen by Cardiology -2D echo done on 03/02/2020 revealing LVEF 45 to 50% with left ventricle global hypokinesis, grade 1 diastolic dysfunction -IV morphine as needed for severe pain  AKI -Baseline creatinine appears to be 0.8 with GFR 26 -Presented with creatinine of 1.55 with GFR of 50 -Patient's BUN/Cr is now 11/0.92 -Continued IV fluid in the setting of recent IV contrast x48 hours but reduced rate and will stop . -Monitor volume status while on IV fluid and will stop IVF now  -Avoid Nephrotoxic Medications, Contrast Dyes, Hypotension and Renally Adjust medications -Continue to Monitor urine output -Repeat CMP in the AM   Improvement hypervolemic hyponatremia -Serum sodium 130> 131> 134 > is again 134 today -Continue to Monitor and Trend -Repeat CMP in the AM   Acute on Chronic Combined Diastolic and Systolic CHF -  Last 2D echo with findings as stated above, LVEF reduced from prior -Continue Coreg, and other cardiac medications as recommended by cardiology -Closely monitor volume status while on IV fluid and now will stop -Cardiology added Irbesartan 75 mg po Daily and Cardiology increased Carvedilol to 6.25 mg po BID; Cardiology deferring starting Entresto 24/26 BID for now and can discuss starting Entresto in the outpatient setting  -Continue strict I's and O's and daily weights -Patient is - 4.894 liters -Continue to Monitor for S/Sx of Volume Overload  Uncontrolled Diabetes Mellitus Type 2 with Hyperglycemia -Hemoglobin A1c  11.3 on 03/02/2020 -Continue Moderate Novolog SSI q4h and no changes to before meals and at bedtime -Continue to Monitor CBG's per Protocol -CBG's ranging from 105-161 -Consulted Diabetes Education Coordinator   Normocytic Anemia -Patient's Hgb/Hct 12.3/34.8 -> 12.1/36.1 and today it is 12.8/37.8 despite him having hemoptysis -Check Anemia Panel in the AM  -Continue to Monitor for S/Sx of bleeding as he is on Triple Therapy; Currently no overt Bleeding noted but did have Hemoptysis again today -Repeat CBC in the AM   Acute Toxic Metabolic Encephalopathy -Resolved -UDS + for amphetamine, benzodiazepine, and THC on 03/02/2020. -CT head neg  Tobacco Abuse -Smoking Cessation Counseling -C/w Nicotine 21 mg TD q24h  HLD -C/w Rosuvastatin 20 mg po qHS  Polysubstance Abuse -UDS + amphetamins, benzo and THC -CIWA protocol for history of benzodiazepine abuse -Polysubstance abuse counseling at bedside -No evidence of benzodiazepine withdrawal at the time of this visit.  Ascending thoracic aorta aneurysm measuring 4.5 x 4.3 cm Slightly enlarged from prior examination in 2019 -Ascending thoracic aortic aneurysm. -Recommend semi-annual imaging followup by CTA or MRA and referral to cardiothoracic surgery if not already obtained. This recommendation follows 2010 ACCF/AHA/AATS/ACR/ASA/SCA/SCAI/SIR/STS/SVM Guidelines. -Provide referral to CTS vs VVS at discharge for follow up.  Minimally displaced fractures of the anterior left 6th rib, anterior right 2nd and 5th ribs likely due to resuscitation in the setting of cardiac arrest. -C/w Pain control with Hydrocodone-Acetaminophen 1 tab po q6hprn -Closely monitor  Obesity -Complicates overall prognosis and care -Estimated body mass index is 30.17 kg/m as calculated from the following:   Height as of this encounter: 5\' 8"  (1.727 m).   Weight as of this encounter: 90 kg. -Weight Loss and Dietary Counseling given   DVT prophylaxis:  Heparin gtt  Code Status: FULL CODE  Family Communication: No family present at bedside  Disposition Plan: Pending further clinical improvement, weaning off of O2, and PT/OT evaluation  Status is: Inpatient  Remains inpatient appropriate because:Unsafe d/c plan, IV treatments appropriate due to intensity of illness or inability to take PO and Inpatient level of care appropriate due to severity of illness   Dispo:  Patient From: Home  Planned Disposition: Home with Health Care Svc  Expected discharge date: 03/09/2020  Medically stable for discharge: No  Consultants:   Cardiology  PCCM   Procedures:  Left heart cath 03/05/20: Conclusions: 1. Severe native coronary artery disease with chronic total occlusions of the ostial LAD and RCA as well as 99% stenosis of distal RCA that is supplied by SVG to RCA and multifocal moderate to severe disease disease involving the LCx and OM1. 2. Widely patent LIMA to LAD. 3. Widely patent SVG to distal RCA with 99% stenosis in the native RCA beyond the SVG anastomosis. 4. Upper normal left ventricular filling pressure. 5. Successful PCI to distal RCA through SVG to RCA using Resolute Onyx 2.5 x 12 mm drug-eluting stent (postdilated to 2.9  mm) with 0% residual stenosis and TIMI-3 flow. 6. Incidental note made of occluded proximal right superficial femoral artery on sheath angiogram.  Recommendations: 1. Dual antiplatelet therapy with aspirin and ticagrelor for at least 12 months. 2. Aggressive secondary prevention. 3. Favor medical therapy for LCx/OM disease. 4. Optimize goal-directed medical therapy for mildly reduced LVEF. 5. Remove right femoral artery sheath with manual compression 2 hours after discontinuation of bivalirudin at the end of the procedure. 6. Consider outpatient lower extremity noninvasive vascular testing.   Echo 03/02/20: 1. Left ventricular ejection fraction, by estimation, is 45 to 50%. The  left ventricle has mildly  decreased function. The left ventricle  demonstrates global hypokinesis. There is mild left ventricular  hypertrophy. Left ventricular diastolic parameters  are consistent with Grade I diastolic dysfunction (impaired relaxation).  2. Right ventricular systolic function is mildly reduced. The right  ventricular size is normal. Tricuspid regurgitation signal is inadequate  for assessing PA pressure.  3. The mitral valve is normal in structure. No evidence of mitral valve  regurgitation. No evidence of mitral stenosis.  4. The aortic valve is tricuspid. Aortic valve regurgitation is not  visualized. No aortic stenosis is present.  5. Aortic dilatation noted. There is mild dilatation of the aortic root,  measuring 42 mm.  6. The inferior vena cava is normal in size with greater than 50%  respiratory variability, suggesting right atrial pressure of 3 mmHg.   Antimicrobials: Anti-infectives (From admission, onward)   None       Subjective: Seen and examined at bedside and states that he has some chest discomfort or shortness of breath with coughing and continues to have some hemoptysis today.  States that he has follow-up with better and has been weaned off of oxygen.  No nausea or vomiting.  Denies any lightheadedness or dizziness.  No other concerns or complaints at this time.  Objective: Vitals:   03/08/20 0028 03/08/20 0603 03/08/20 0700 03/08/20 0808  BP: (!) 153/98 (!) 163/98 (!) 149/93   Pulse: 88 97 97   Resp: 15 20 14 20   Temp: 98.7 F (37.1 C) 98.7 F (37.1 C) (!) 97.5 F (36.4 C)   TempSrc: Oral Oral Oral   SpO2: 91% 92% 95%   Weight:  90 kg    Height:        Intake/Output Summary (Last 24 hours) at 03/08/2020 1744 Last data filed at 03/08/2020 [id number] Gross per 24 hour  Intake 120 ml  Output 2500 ml  Net -2380 ml   Filed Weights   03/06/20 0429 03/07/20 0435 03/08/20 0603  Weight: 91.3 kg 90.1 kg 90 kg   Examination: Physical Exam:  Constitutional: WN/WD  obese Caucasian male currently in no acute distress appears calm and resting in bed Eyes: Lids and conjunctivae normal, sclerae anicteric  ENMT: External Ears, Nose appear normal. Grossly normal hearing.   Neck: Appears normal, supple, no cervical masses, normal ROM, no appreciable thyromegaly; no JVD Respiratory: Diminished to auscultation bilaterally with coarse breath sounds, no wheezing, rales, rhonchi or crackles. Normal respiratory effort and patient is not tachypenic. No accessory muscle use. Unlabored breathing Cardiovascular: RRR, no murmurs / rubs / gallops. S1 and S2 auscultated. 1+ LE edema Abdomen: Soft, non-tender, non-distended. Bowel sounds positive.  GU: Deferred. Musculoskeletal: No clubbing / cyanosis of digits/nails. No joint deformity upper and lower extremities. Skin: No rashes, lesions, ulcers on a limited skin evaluation. No induration; Warm and dry.  Neurologic: CN 2-12 grossly intact with no focal deficits.  Romberg sign and cerebellar reflexes not assessed.  Psychiatric: Normal judgment and insight. Alert and oriented x 3. Normal mood and appropriate affect.   Data Reviewed: I have personally reviewed following labs and imaging studies  CBC: Recent Labs  Lab 03/02/20 0058 03/02/20 0433 03/03/20 0234 03/04/20 1336 03/06/20 0238 03/07/20 0426 03/08/20 0227  WBC 9.6  --  11.9* 5.9 4.7 5.2 5.8  NEUTROABS 7.1  --   --   --   --   --   --   HGB 15.7   < > 13.0 13.6 12.3* 12.1* 12.8*  HCT 45.4   < > 39.8 39.4 34.8* 36.1* 37.8*  MCV 88.0  --  90.0 86.2 85.7 87.4 85.9  PLT 244  --  167 172 218 230 265   < > = values in this interval not displayed.   Basic Metabolic Panel: Recent Labs  Lab 03/02/20 0358 03/02/20 0433 03/04/20 1012 03/04/20 1336 03/05/20 0221 03/06/20 0238 03/07/20 0426 03/08/20 0227  NA 131*   < > 130*  --  131* 134* 133* 134*  K 4.7   < > 3.8  --  3.8 4.0 3.8 3.7  CL 97*   < > 95*  --  93* 95* 99 99  CO2 20*   < > 25  --  26 27 22 23    GLUCOSE 383*   < > 164*  --  175* 157* 136* 125*  BUN 14   < > 12  --  15 13 10 11   CREATININE 1.31*   < > 0.97  --  0.95 1.02 0.94 0.92  CALCIUM 9.0   < > 8.7*  --  8.8* 8.7* 8.3* 8.5*  MG 2.0  --   --  1.7  --   --   --   --   PHOS 4.8*  --   --  3.3  --   --   --   --    < > = values in this interval not displayed.   GFR: Estimated Creatinine Clearance: 90.7 mL/min (by C-G formula based on SCr of 0.92 mg/dL). Liver Function Tests: Recent Labs  Lab 03/02/20 0058 03/06/20 0238  AST 138* 16  ALT 99* 20  ALKPHOS 80 62  BILITOT 0.5 0.2*  PROT 6.4* 6.0*  ALBUMIN 3.2* 2.4*   No results for input(s): LIPASE, AMYLASE in the last 168 hours. No results for input(s): AMMONIA in the last 168 hours. Coagulation Profile: No results for input(s): INR, PROTIME in the last 168 hours. Cardiac Enzymes: No results for input(s): CKTOTAL, CKMB, CKMBINDEX, TROPONINI in the last 168 hours. BNP (last 3 results) No results for input(s): PROBNP in the last 8760 hours. HbA1C: No results for input(s): HGBA1C in the last 72 hours. CBG: Recent Labs  Lab 03/08/20 0025 03/08/20 0518 03/08/20 0754 03/08/20 1212 03/08/20 1714  GLUCAP 112* 128* 127* 145* 157*   Lipid Profile: Recent Labs    03/06/20 0238  CHOL 131  HDL 27*  LDLCALC 81  TRIG 161  CHOLHDL 4.9   Thyroid Function Tests: No results for input(s): TSH, T4TOTAL, FREET4, T3FREE, THYROIDAB in the last 72 hours. Anemia Panel: No results for input(s): VITAMINB12, FOLATE, FERRITIN, TIBC, IRON, RETICCTPCT in the last 72 hours. Sepsis Labs: Recent Labs  Lab 03/02/20 0058 03/02/20 0433 03/02/20 1442  LATICACIDVEN 3.2* 2.7* 1.2    Recent Results (from the past 240 hour(s))  Resp Panel by RT-PCR (Flu A&B, Covid) Nasopharyngeal Swab     Status:  None   Collection Time: 03/02/20 12:59 AM   Specimen: Nasopharyngeal Swab; Nasopharyngeal(NP) swabs in vial transport medium  Result Value Ref Range Status   SARS Coronavirus 2 by RT PCR  NEGATIVE NEGATIVE Final    Comment: (NOTE) SARS-CoV-2 target nucleic acids are NOT DETECTED.  The SARS-CoV-2 RNA is generally detectable in upper respiratory specimens during the acute phase of infection. The lowest concentration of SARS-CoV-2 viral copies this assay can detect is 138 copies/mL. A negative result does not preclude SARS-Cov-2 infection and should not be used as the sole basis for treatment or other patient management decisions. A negative result may occur with  improper specimen collection/handling, submission of specimen other than nasopharyngeal swab, presence of viral mutation(s) within the areas targeted by this assay, and inadequate number of viral copies(<138 copies/mL). A negative result must be combined with clinical observations, patient history, and epidemiological information. The expected result is Negative.  Fact Sheet for Patients:  BloggerCourse.com  Fact Sheet for Healthcare Providers:  SeriousBroker.it  This test is no t yet approved or cleared by the [place] FDA and  has been authorized for detection and/or diagnosis of SARS-CoV-2 by FDA under an Emergency Use Authorization (EUA). This EUA will remain  in effect (meaning this test can be used) for the duration of the COVID-19 declaration under Section 564(b)(1) of the Act, 21 U.S.C.section 360bbb-3(b)(1), unless the authorization is terminated  or revoked sooner.       Influenza A by PCR NEGATIVE NEGATIVE Final   Influenza B by PCR NEGATIVE NEGATIVE Final    Comment: (NOTE) The Xpert Xpress SARS-CoV-2/FLU/RSV plus assay is intended as an aid in the diagnosis of influenza from Nasopharyngeal swab specimens and should not be used as a sole basis for treatment. Nasal washings and aspirates are unacceptable for Xpert Xpress SARS-CoV-2/FLU/RSV testing.  Fact Sheet for Patients: BloggerCourse.com  Fact Sheet for  Healthcare Providers: SeriousBroker.it  This test is not yet approved or cleared by the [place] FDA and has been authorized for detection and/or diagnosis of SARS-CoV-2 by FDA under an Emergency Use Authorization (EUA). This EUA will remain in effect (meaning this test can be used) for the duration of the COVID-19 declaration under Section 564(b)(1) of the Act, 21 U.S.C. section 360bbb-3(b)(1), unless the authorization is terminated or revoked.  Performed at [facility], [address] [place], [place] [address]     RN Pressure Injury Documentation:     Estimated body mass index is 30.17 kg/m as calculated from the following:   Height as of this encounter: 5\' 8"  (1.727 m).   Weight as of this encounter: 90 kg.  Malnutrition Type:   Malnutrition Characteristics:   Nutrition Interventions:    Radiology Studies: DG CHEST PORT 1 VIEW  Result Date: 03/08/2020 CLINICAL DATA:  Shortness of breath, cough EXAM: PORTABLE CHEST 1 VIEW COMPARISON:  CT 03/06/2020 FINDINGS: Probable skin fold projects over the periphery of the right lung (see annotated image). No clear pneumothorax. Previously seen pleural effusions are not well visualized on this radiograph. Some streaky opacities with a mid to lower lung predominance are favored to reflect some persistent atelectatic changes. Redemonstrated postsurgical changes from prior sternotomy and CABG with fracture of the inferior most sternal suture, unchanged from comparison imaging. Stable cardiomediastinal contours with a calcified aorta. No acute osseous or soft tissue abnormality. Severe degenerative changes in the left shoulder. Additional multilevel degenerative changes in the spine. Telemetry leads overlie the chest. IMPRESSION: 1. Probable skin fold  projects over the periphery of the right lung. Pneumothorax significantly less favored however if there is persisting clinical concern, patient could be  repositioned and reimaged portably or an AP and lateral radiograph could be obtained. 2. Previously seen pleural effusions are not well visualized on this radiograph. 3. Streaky opacities with a mid to lower lung predominance are favored to reflect some persistent atelectatic changes. These results will be called to the ordering clinician or representative by the Radiologist Assistant, and communication documented in the PACS or [employer]. Electronically Signed   By: Kreg Shropshire M.D.   On: 03/08/2020 05:48    Scheduled Meds: . aspirin  81 mg Oral Daily  . carvedilol  6.25 mg Oral BID WC  . clopidogrel  75 mg Oral Daily  . docusate sodium  100 mg Oral BID  . famotidine  20 mg Oral BID  . folic acid  1 mg Oral Daily  .  HYDROmorphone (DILAUDID) injection  0.5 mg Intravenous Once  . [START ON 03/09/2020] insulin aspart  0-15 Units Subcutaneous TID WC  . insulin aspart  0-5 Units Subcutaneous QHS  . lidocaine  1 application Urethral Once  . multivitamin with minerals  1 tablet Oral Daily  . nicotine  21 mg Transdermal Daily  . polyethylene glycol  17 g Oral Daily  . rosuvastatin  20 mg Oral QHS  . sacubitril-valsartan  1 tablet Oral BID  . sodium chloride flush  3 mL Intravenous Q12H  . tamsulosin  0.4 mg Oral QPC supper  . thiamine  100 mg Oral Daily   Or  . thiamine  100 mg Intravenous Daily   Continuous Infusions: . sodium chloride    . heparin 2,000 Units/hr (03/08/20 1220)    LOS: 6 days   Merlene Laughter, DO [facility] PAGER is on AMION  If 7PM-7AM, please contact night-coverage [email or web address]

## 2020-03-08 NOTE — Progress Notes (Signed)
CARDIAC REHAB PHASE I   PRE:  Rate/Rhythm: 84 SR  BP:  Supine: 179/106  Sitting:   Standing:    SaO2: 95%RA  MODE:  Ambulation: 200 ft   POST:  Rate/Rhythm: 101 ST  BP:  Supine: 174/96  Sitting:   Standing:    SaO2: 95%RA 1115-1135 Pt agreeable to walk. Pt walked 200 ft on RA with gait belt use, rolling walker and asst x 1. Did well. Tolerated increase in distance well. Offered recliner but pt stated he wanted to go back to bed. Put IS near bed.  BP elevated. No c/o CP.   Luetta Nutting, RN BSN  03/08/2020 11:34 AM

## 2020-03-08 NOTE — Progress Notes (Addendum)
Inpatient Diabetes Program Recommendations  AACE/ADA: New Consensus Statement on Inpatient Glycemic Control (2015)  Target Ranges:  Prepandial:   less than 140 mg/dL      Peak postprandial:   less than 180 mg/dL (1-2 hours)      Critically ill patients:  140 - 180 mg/dL   Lab Results  Component Value Date   GLUCAP 127 (H) 03/08/2020   HGBA1C 11.3 (H) 03/02/2020    Review of Glycemic Control Results for BLAIDEN, WERTH (MRN [id number]) as of 03/08/2020 11:05  Ref. Range 03/07/2020 16:06 03/07/2020 21:17 03/08/2020 00:25 03/08/2020 05:18 03/08/2020 07:54  Glucose-Capillary Latest Ref Range: 70 - 99 mg/dL 194 (H) 174 (H) 081 (H) 128 (H) 127 (H)   Diabetes history:  DM2 Outpatient Diabetes medications:  Glipizide 10 mg daily Metformin 850 mg BID Current orders for Inpatient glycemic control:  Novolog 0-15 units Q4H  Inpatient Diabetes Program Recommendations:    Please consider Novlog 0-15 units TID and 0-5 QHS as patient is eating.  Will speak with patient regarding A1C.  Might consider SGLT2 at discharge.  Will continue to follow while inpatient.  Thank you, Dulce Sellar, RN, BSN Diabetes Coordinator Inpatient Diabetes Program [phone] (team pager from 8a-5p)

## 2020-03-08 NOTE — Progress Notes (Addendum)
Progress Note  Patient Name: [name] Date of Encounter: 03/08/2020  [facility] Cardiologist: Dorris Carnes, MD   Subjective   Chest pain with coughing and breathing. Blood in sputum.   Inpatient Medications    Scheduled Meds: . aspirin  81 mg Oral Daily  . carvedilol  6.25 mg Oral BID WC  . clopidogrel  75 mg Oral Daily  . docusate sodium  100 mg Oral BID  . famotidine  20 mg Oral BID  . folic acid  1 mg Oral Daily  .  HYDROmorphone (DILAUDID) injection  0.5 mg Intravenous Once  . insulin aspart  0-15 Units Subcutaneous Q4H  . lidocaine  1 application Urethral Once  . multivitamin with minerals  1 tablet Oral Daily  . nicotine  21 mg Transdermal Daily  . polyethylene glycol  17 g Oral Daily  . rosuvastatin  20 mg Oral QHS  . sacubitril-valsartan  1 tablet Oral BID  . sodium chloride flush  3 mL Intravenous Q12H  . tamsulosin  0.4 mg Oral QPC supper  . thiamine  100 mg Oral Daily   Or  . thiamine  100 mg Intravenous Daily   Continuous Infusions: . sodium chloride    . heparin 2,000 Units/hr (03/07/20 2331)   PRN Meds: sodium chloride, acetaminophen, docusate sodium, HYDROcodone-acetaminophen, ipratropium-albuterol, loperamide, morphine injection, ondansetron (ZOFRAN) IV, polyethylene glycol, sodium chloride, sodium chloride flush   Vital Signs    Vitals:   03/07/20 1908 03/08/20 0028 03/08/20 0603 03/08/20 0700  BP: (!) 142/80 (!) 153/98 (!) 163/98 (!) 149/93  Pulse: 93 88 97 97  Resp: $Remo'14 15 20 14  'BmYbt$ Temp: 97.6 F (36.4 C) 98.7 F (37.1 C) 98.7 F (37.1 C) (!) 97.5 F (36.4 C)  TempSrc: Axillary Oral Oral Oral  SpO2: 94% 91% 92% 95%  Weight:   90 kg   Height:        Intake/Output Summary (Last 24 hours) at 03/08/2020 0931 Last data filed at 03/08/2020 0843 Gross per 24 hour  Intake 120 ml  Output 3500 ml  Net -3380 ml   Last 3 Weights 03/08/2020 03/07/2020 03/06/2020  Weight (lbs) 198 lb 6.6 oz 198 lb 10.2 oz 201 lb 3.2 oz  Weight (kg) 90 kg  90.1 kg 91.264 kg  Some encounter information is confidential and restricted. Go to Review Flowsheets activity to see all data.      Telemetry    Sinus rhythm with HR 80-90s - Personally Reviewed  ECG    No new tracings - Personally Reviewed  Physical Exam   GEN: No acute distress.   Neck: No JVD Cardiac: RRR, no murmurs, rubs, or gallops.  Respiratory: Clear to auscultation bilaterally. GI: Soft, nontender, non-distended  MS: No edema; No deformity. Neuro:  Nonfocal  Psych: Normal affect  Groin soft without hemtoma  Labs    High Sensitivity Troponin:   Recent Labs  Lab 03/02/20 0058 03/02/20 0358 03/02/20 1024 03/02/20 1442  TROPONINIHS 23* 102* 463* 311*      Chemistry Recent Labs  Lab 03/02/20 0058 03/02/20 0358 03/06/20 0238 03/07/20 0426 03/08/20 0227  NA 131*   < > 134* 133* 134*  K 3.9   < > 4.0 3.8 3.7  CL 96*   < > 95* 99 99  CO2 22   < > $R'27 22 23  'gC$ GLUCOSE 476*   < > 157* 136* 125*  BUN 15   < > $R'13 10 11  'AP$ CREATININE 1.55*   < >  1.02 0.94 0.92  CALCIUM 8.7*   < > 8.7* 8.3* 8.5*  PROT 6.4*  --  6.0*  --   --   ALBUMIN 3.2*  --  2.4*  --   --   AST 138*  --  16  --   --   ALT 99*  --  20  --   --   ALKPHOS 80  --  62  --   --   BILITOT 0.5  --  0.2*  --   --   GFRNONAA 50*   < > >60 >60 >60  ANIONGAP 13   < > $R'12 12 12   'tr$ < > = values in this interval not displayed.     Hematology Recent Labs  Lab 03/06/20 0238 03/07/20 0426 03/08/20 0227  WBC 4.7 5.2 5.8  RBC 4.06* 4.13* 4.40  HGB 12.3* 12.1* 12.8*  HCT 34.8* 36.1* 37.8*  MCV 85.7 87.4 85.9  MCH 30.3 29.3 29.1  MCHC 35.3 33.5 33.9  RDW 12.8 12.6 12.5  PLT 218 230 265    BNP Recent Labs  Lab 03/04/20 1336  BNP 52.4     DDimer No results for input(s): DDIMER in the last 168 hours.   Radiology    DG CHEST PORT 1 VIEW  Result Date: 03/08/2020 CLINICAL DATA:  Shortness of breath, cough EXAM: PORTABLE CHEST 1 VIEW COMPARISON:  CT 03/06/2020 FINDINGS: Probable skin fold  projects over the periphery of the right lung (see annotated image). No clear pneumothorax. Previously seen pleural effusions are not well visualized on this radiograph. Some streaky opacities with a mid to lower lung predominance are favored to reflect some persistent atelectatic changes. Redemonstrated postsurgical changes from prior sternotomy and CABG with fracture of the inferior most sternal suture, unchanged from comparison imaging. Stable cardiomediastinal contours with a calcified aorta. No acute osseous or soft tissue abnormality. Severe degenerative changes in the left shoulder. Additional multilevel degenerative changes in the spine. Telemetry leads overlie the chest. IMPRESSION: 1. Probable skin fold projects over the periphery of the right lung. Pneumothorax significantly less favored however if there is persisting clinical concern, patient could be repositioned and reimaged portably or an AP and lateral radiograph could be obtained. 2. Previously seen pleural effusions are not well visualized on this radiograph. 3. Streaky opacities with a mid to lower lung predominance are favored to reflect some persistent atelectatic changes. These results will be called to the ordering clinician or representative by the Radiologist Assistant, and communication documented in the PACS or [employer]. Electronically Signed   By: Lovena Le M.D.   On: 03/08/2020 05:48   VAS [place] LOWER EXTREMITY VENOUS (DVT)  Result Date: 03/06/2020  Lower Venous DVT Study Indications: Pulmonary embolism.  Comparison Study: 04/23/19 previous Performing Technologist: Abram Sander RVS  Examination Guidelines: A complete evaluation includes B-mode imaging, spectral Doppler, color Doppler, and power Doppler as needed of all accessible portions of each vessel. Bilateral testing is considered an integral part of a complete examination. Limited examinations for reoccurring indications may be performed as noted. The reflux portion of  the exam is performed with the patient in reverse Trendelenburg.  +---------+---------------+---------+-----------+----------+--------------+ RIGHT    CompressibilityPhasicitySpontaneityPropertiesThrombus Aging +---------+---------------+---------+-----------+----------+--------------+ CFV      Full           Yes      Yes                                 +---------+---------------+---------+-----------+----------+--------------+  SFJ      Full                                                        +---------+---------------+---------+-----------+----------+--------------+ FV Prox  Full                                                        +---------+---------------+---------+-----------+----------+--------------+ FV Mid   Full                                                        +---------+---------------+---------+-----------+----------+--------------+ FV DistalFull                                                        +---------+---------------+---------+-----------+----------+--------------+ PFV      Full                                                        +---------+---------------+---------+-----------+----------+--------------+ POP      Full           Yes      Yes                                 +---------+---------------+---------+-----------+----------+--------------+ PTV      Full                                                        +---------+---------------+---------+-----------+----------+--------------+ PERO     Full                                                        +---------+---------------+---------+-----------+----------+--------------+   +---------+---------------+---------+-----------+----------+--------------+ LEFT     CompressibilityPhasicitySpontaneityPropertiesThrombus Aging +---------+---------------+---------+-----------+----------+--------------+ CFV      Full           Yes      Yes                                  +---------+---------------+---------+-----------+----------+--------------+ SFJ      Full                                                        +---------+---------------+---------+-----------+----------+--------------+  FV Prox  Full                                                        +---------+---------------+---------+-----------+----------+--------------+ FV Mid   Full                                                        +---------+---------------+---------+-----------+----------+--------------+ FV DistalFull                                                        +---------+---------------+---------+-----------+----------+--------------+ PFV      Full                                                        +---------+---------------+---------+-----------+----------+--------------+ POP      Full           Yes      Yes                                 +---------+---------------+---------+-----------+----------+--------------+ PTV      Full                                                        +---------+---------------+---------+-----------+----------+--------------+ PERO     Full                                                        +---------+---------------+---------+-----------+----------+--------------+     Summary: BILATERAL: - No evidence of deep vein thrombosis seen in the lower extremities, bilaterally. - No evidence of superficial venous thrombosis in the lower extremities, bilaterally. -No evidence of popliteal cyst, bilaterally.   *See table(s) above for measurements and observations. Electronically signed by Deitra Mayo MD on 03/06/2020 at 2:19:49 PM.    Final     Cardiac Studies   Left heart cath 03/05/20: Conclusions: 1. Severe native coronary artery disease with chronic total occlusions of the ostial LAD and RCA as well as 99% stenosis of distal RCA that is supplied by SVG to RCA and multifocal moderate to  severe disease disease involving the LCx and OM1. 2. Widely patent LIMA to LAD. 3. Widely patent SVG to distal RCA with 99% stenosis in the native RCA beyond the SVG anastomosis. 4. Upper normal left ventricular filling pressure. 5. Successful PCI to distal RCA through SVG to RCA using Resolute Onyx 2.5 x 12 mm drug-eluting stent (postdilated to 2.9 mm) with 0% residual stenosis  and TIMI-3 flow. 6. Incidental note made of occluded proximal right superficial femoral artery on sheath angiogram.  Recommendations: 1. Dual antiplatelet therapy with aspirin and ticagrelor for at least 12 months. 2. Aggressive secondary prevention. 3. Favor medical therapy for LCx/OM disease. 4. Optimize goal-directed medical therapy for mildly reduced LVEF. 5. Remove right femoral artery sheath with manual compression 2 hours after discontinuation of bivalirudin at the end of the procedure. 6. Consider outpatient lower extremity noninvasive vascular testing.   Echo 03/02/20: 1. Left ventricular ejection fraction, by estimation, is 45 to 50%. The  left ventricle has mildly decreased function. The left ventricle  demonstrates global hypokinesis. There is mild left ventricular  hypertrophy. Left ventricular diastolic parameters  are consistent with Grade I diastolic dysfunction (impaired relaxation).  2. Right ventricular systolic function is mildly reduced. The right  ventricular size is normal. Tricuspid regurgitation signal is inadequate  for assessing PA pressure.  3. The mitral valve is normal in structure. No evidence of mitral valve  regurgitation. No evidence of mitral stenosis.  4. The aortic valve is tricuspid. Aortic valve regurgitation is not  visualized. No aortic stenosis is present.  5. Aortic dilatation noted. There is mild dilatation of the aortic root,  measuring 42 mm.  6. The inferior vena cava is normal in size with greater than 50%  respiratory variability, suggesting right atrial  pressure of 3 mmHg.    Patient Profile     65 y.o. male with CAD s/p CABG 1992 at [facility] without cardiac follow-up recently, CVA, HTN, DM2, CKD stage II-III, tobacco use, depression, aortic root and abdominal aneurysm (followed by [facility]), and history of illicit drug use admitted with cardiac vs respiratory arrest. CPR initiated, agonal breathing on arrival to ER, ROSC achieved without shock or ACLS.Possibly found with white powder nearby.2D echo showed mild LV dysfunction and workup showed elevated troponins. Pt denied use of amphetamines for several months. UDS + amphetamines, benzos, THC. Cardiology seeing for elevation in troponin with resultant cath above. Also found to have persistent hypoxia/hemoptysis with + PE.  Assessment & Plan    Out of hospital cardiac vs respiratory arrest with acute hypoxic respiratory failure Acute PE - CPR, now shock or ACLS medications --> question if there was a circulatory arrest since no shock or medications were required  - CTA positive for PE --> heparin drip initiated - pt reportedly was not taking xarelto - was on low dose xarelto for history of stroke, bur reports being taken off this  - per primary, transition to treatment dose of eliquis 10 mg BID x 7 days - TOC pharmacy can help with arranging a kit, but will need 5 mg BID to go to his home pharmacy   CAD s/p CABG NSTEMI - hs troponin peaked at 463 before down-trending - heart cath with 99% lesion in the distal RCA distall to SVG, moderate to severe lesion in the LCx - s/p PCI to distal RCA, medical treatment for remaining disease - triple therapy for one month, then D/C ASA    Chest pain Hemoptysis - pt complains of chest pain when he coughs and when he inhales deeply - do not suspect cardiac source for this - also coughing up blood in sputum - will need to monitor him on full dose eliquis   Polysubstance abuse - UDS positive for amphetamines, benzos, and THC - recommend  cessation   Aortic root dilation Ascending aorta enlargement - thoracic aorta enlargement 4.5 x 4.3 cm which is enlarged  from prior study in 2019 that measured 4.2 x 4.1 cm - 42 mm aortic root on echo this admission - will need good BP control - follow with [facility]   Cardiomyopathy  - suspect a component of ischemic cardiomyopathy - EF 45-50% - continue 6.25 mg coreg BID --> may need to titrate outpatient - started low dose entresto   Hypertension - pressure still elevated, has not received full dose coreg yet - 159/93 HR 90s after low dose entresto and 3.125 mg coreg - continue to monitor, may need to titrate coreg to 12.5 mg BID for pressure and heart rate prior to discharge - question possible dehydration if entresto is increased    For questions or updates, please contact [facility] Please consult [email or web address] for contact info under        Signed, Ledora Bottcher, PA  03/08/2020, 9:31 AM    Attending Note:   The patient was seen and examined.  Agree with assessment and plan as noted above.  Changes made to the above note as needed.  Patient seen and independently examined with  Doreene Adas, PA .   We discussed all aspects of the encounter. I agree with the assessment and plan as stated above.  1.   CAD :  Stable from a cardiac standpoint.    S/p stenting  No angina  2.  Pulmonary embolus:   In iv heparin currently .  The plan is to transition to eliquis if his insurance covers.      I have spent a total of 40 minutes with patient reviewing hospital  notes , telemetry, EKGs, labs and examining patient as well as establishing an assessment and plan that was discussed with the patient. > 50% of time was spent in direct patient care.    Thayer Headings, Brooke Bonito., MD, [facility] 03/08/2020, 11:53 AM [address],  [place] Pager [phone]

## 2020-03-08 NOTE — Progress Notes (Signed)
Occupational Therapy Treatment Patient Details Name: [name] MRN: [id number] DOB: 1955/06/11 Today's Date: 03/08/2020    History of present illness 65 yo with past medical history significant for essential hypertension, type 2 diabetes, prior CVA and chronic depression who was brought in by ems after witnessed arrest.   OT comments  Pt reporting fatigue and ongoing post CPR chest pain. Agreed to sit EOB for grooming, reports sitting EOB for meals and participating with nursing staff during ADL. Pt declined attempt to don his own socks. Educated pt in importance of activity to prepare him to return home.  Educated pt in use of pillow to splint chest. Will continue efforts.  Follow Up Recommendations  Home health OT    Equipment Recommendations  None recommended by OT    Recommendations for Other Services      Precautions / Restrictions Precautions Precautions: Fall       Mobility Bed Mobility Overal bed mobility: Modified Independent             General bed mobility comments: sidelying<>sit  Transfers                 General transfer comment: declined OOB, citing fatigue    Balance Overall balance assessment: Needs assistance Sitting-balance support: No upper extremity supported;Feet supported Sitting balance-Leahy Scale: Good Sitting balance - Comments: reports he has been eating at EOB consistently                                   ADL either performed or assessed with clinical judgement   ADL Overall ADL's : Needs assistance/impaired     Grooming: Wash/dry hands;Wash/dry face;Sitting;Set up               Lower Body Dressing: Maximal assistance;Bed level Lower Body Dressing Details (indicate cue type and reason): refused attempt to don socks                     Vision       Perception     Praxis      Cognition Arousal/Alertness: Awake/alert Behavior During Therapy: Flat affect Overall Cognitive  Status: No family/caregiver present to determine baseline cognitive functioning                                 General Comments: overall poor insight into skills necessary for pt to return home        Exercises     Shoulder Instructions       General Comments      Pertinent Vitals/ Pain       Pain Assessment: Faces Faces Pain Scale: Hurts little more Pain Location: sternal area from compressions. Pain Descriptors / Indicators: Guarding;Grimacing Pain Intervention(s): Monitored during session;Repositioned  Home Living                                          Prior Functioning/Environment              Frequency  Min 2X/week        Progress Toward Goals  OT Goals(current goals can now be found in the care plan section)  Progress towards OT goals: Progressing toward goals  Acute Rehab OT Goals Patient Stated Goal: reduction of  chest pain OT Goal Formulation: With patient Time For Goal Achievement: 03/17/20 Potential to Achieve Goals: Good  Plan Discharge plan needs to be updated    Co-evaluation                 AM-PAC OT "6 Clicks" Daily Activity     Outcome Measure   Help from another person eating meals?: None Help from another person taking care of personal grooming?: A Little Help from another person toileting, which includes using toliet, bedpan, or urinal?: A Little Help from another person bathing (including washing, rinsing, drying)?: A Little Help from another person to put on and taking off regular upper body clothing?: None Help from another person to put on and taking off regular lower body clothing?: A Little 6 Click Score: 20    End of Session    OT Visit Diagnosis: Pain   Activity Tolerance Patient limited by fatigue   Patient Left in bed;with call bell/phone within reach;with bed alarm set   Nurse Communication          Time: 6812-7517 OT Time Calculation (min): 17 min  Charges: OT  General Charges $OT Visit: 1 Visit OT Treatments $Self Care/Home Management : 8-22 mins  Martie Round, OTR/L Acute Rehabilitation Services Pager: [phone] Office: [phone]   Evern Bio 03/08/2020, 10:53 AM

## 2020-03-08 NOTE — Progress Notes (Signed)
Chest Xray results back. On call MD made aware.

## 2020-03-08 NOTE — Progress Notes (Addendum)
Heart Failure Stewardship Pharmacist Progress Note   PCP: Dema Severin, NP PCP-Cardiologist: Dietrich Pates, MD    HPI:  65 yo M with PMH of CAD s/p CABG in 1992, CVA, HTN, DM2, CKD II-III, tobacco use, depression, and history of illicit drug use. He was admitted for cardiac vs respiratory arrest. CTA positive for PE. An ECHO was done on 03/02/20 and LVEF was mildly reduced to 45-50% (was 50-55% in August 2015).   Current HF Medications: Carvedilol 6.25 mg BID Entresto 24/26 mg BID  Prior to admission HF Medications: None  Pertinent Lab Values: . Serum creatinine 0.92, BUN 11, Potassium 3.7, Sodium 134, BNP 52, Magnesium 1.7   Vital Signs: . Weight: 198 lbs (admission weight: 208 lbs) . Blood pressure: 150-170/80-90s  . Heart rate: 90s   Medication Assistance / Insurance Benefits Check: Does the patient have prescription insurance?  Yes Type of insurance plan: [employer]  Outpatient Pharmacy:  Prior to admission outpatient pharmacy: [facility] Is the patient willing to use [facility] TOC pharmacy at discharge? Yes   Assessment: 1. Chronic diastolic CHF (EF 18-56%), due to ICM. NYHA class II symptoms. - Continue carvedilol 6.25 mg BID - Continue Entresto 24/26 mg BID (indicated for LVEF <57%)  - Consider starting Jardiance 10 mg daily for both HFpEF and DM (A1c 11.3) prior to discharge, however, with history of non-adherence this may be better initiated outpatient once patient is amenable to other medication additions   Plan: 1) Medication changes recommended at this time: - No recommendations to make at this time   2) Patient assistance application(s): - None pending - Eliqiuis copay $4 per month - Entresto copay $4 per month  3)  Education  - To be completed prior to discharge  Theodis Sato, PharmD PGY-1 Community Pharmacy Resident   03/08/2020 9:13 AM   Sharen Hones, PharmD, BCPS Heart Failure Stewardship Pharmacist Phone [phone]

## 2020-03-08 NOTE — Progress Notes (Signed)
ANTICOAGULATION CONSULT NOTE  Pharmacy Consult for heparin Indication: new pulmonary embolus  No Known Allergies  Patient Measurements: Height: 5\' 8"  (172.7 cm) Weight: 90 kg (198 lb 6.6 oz) IBW/kg (Calculated) : 68.4 Heparin Dosing Weight: 90kg  Vital Signs: Temp: 97.5 F (36.4 C) (01/13 0700) Temp Source: Oral (01/13 0700) BP: 149/93 (01/13 0700) Pulse Rate: 97 (01/13 0700)  Labs: Recent Labs    03/06/20 0238 03/06/20 2015 03/07/20 0426 03/07/20 1111 03/08/20 0227  HGB 12.3*  --  12.1*  --  12.8*  HCT 34.8*  --  36.1*  --  37.8*  PLT 218  --  230  --  265  HEPARINUNFRC  --    < > 0.25* 0.36 0.46  CREATININE 1.02  --  0.94  --  0.92   < > = values in this interval not displayed.    Estimated Creatinine Clearance: 90.7 mL/min (by C-G formula based on SCr of 0.92 mg/dL).   Assessment: 65 year old male with history of cva on low dose rivaroxaban prior to admit, was on hold for cath, which was done yesterday. New PE noted on CT this morning. Orders to start IV heparin. Low dose enoxaparin was given this morning, will start IV heparin without a bolus. Hgb stable overnight.   Heparin level continues to be at goal on 2000 units/hr. No bleeding noted. No problems with bleeding or infusion per RN.  CBC stable.   Goal of Therapy:  Heparin level 0.3-0.7 units/ml Monitor platelets by anticoagulation protocol: Yes   Plan:  Continue heparin drip at 2000 units/hr Follow up transition to apixaban 10mg  bid x7 days then 5mg  bid  Thank you for involving pharmacy in this patient's care.  68 PharmD., BCPS Clinical Pharmacist 03/08/2020 12:49 PM  **Pharmacist phone directory can be found on [email or web address] listed under [facility]**

## 2020-03-08 NOTE — Progress Notes (Addendum)
ANTICOAGULATION CONSULT NOTE  Pharmacy Consult for Apixaban Indication: new pulmonary embolus  No Known Allergies  Patient Measurements: Height: 5\' 8"  (172.7 cm) Weight: 90 kg (198 lb 6.6 oz) IBW/kg (Calculated) : 68.4 Heparin Dosing Weight: 90kg  Vital Signs: Temp: 97.5 F (36.4 C) (01/13 0700) Temp Source: Oral (01/13 0700) BP: 149/93 (01/13 0700) Pulse Rate: 97 (01/13 0700)  Labs: Recent Labs    03/06/20 0238 03/06/20 2015 03/07/20 0426 03/07/20 1111 03/08/20 0227  HGB 12.3*  --  12.1*  --  12.8*  HCT 34.8*  --  36.1*  --  37.8*  PLT 218  --  230  --  265  HEPARINUNFRC  --    < > 0.25* 0.36 0.46  CREATININE 1.02  --  0.94  --  0.92   < > = values in this interval not displayed.    Estimated Creatinine Clearance: 90.7 mL/min (by C-G formula based on SCr of 0.92 mg/dL).   Assessment: 65 year old male with history of cva on low dose rivaroxaban prior to admit, was on hold for cath, which was done yesterday. New PE noted on CT this morning.  Patient now s/p IV Heparin for 48 hours. Pharmacy consulted to change IV Heparin to Apixaban therapy. Dr. 68 is aware of current hemoptysis (CVTS as well) - ok to start Apixaban therapy but monitor hemoptysis closely.   CBC has remains stable.   Goal of Therapy:  Heparin level 0.3-0.7 units/ml Monitor platelets by anticoagulation protocol: Yes   Plan:  Discontinue IV Heparin at the same time you give the first dose of Apixaban.  Start Apixaban therapy 10mg  po BID x7 days then 5mg  po BID.   Thank you for involving pharmacy in this patient's care.  Marland Mcalpine, PharmD, BCPS, BCCCP Clinical Pharmacist, [phone] until 10:30PM 03/08/2020 5:50 PM  **Pharmacist phone directory can be found on [email or web address] listed under [facility]**

## 2020-03-09 ENCOUNTER — Inpatient Hospital Stay (HOSPITAL_COMMUNITY): Payer: Medicare Other

## 2020-03-09 ENCOUNTER — Other Ambulatory Visit (HOSPITAL_COMMUNITY): Payer: Self-pay | Admitting: Internal Medicine

## 2020-03-09 DIAGNOSIS — I469 Cardiac arrest, cause unspecified: Secondary | ICD-10-CM | POA: Diagnosis not present

## 2020-03-09 DIAGNOSIS — I2510 Atherosclerotic heart disease of native coronary artery without angina pectoris: Secondary | ICD-10-CM | POA: Diagnosis not present

## 2020-03-09 DIAGNOSIS — I2699 Other pulmonary embolism without acute cor pulmonale: Secondary | ICD-10-CM | POA: Diagnosis not present

## 2020-03-09 DIAGNOSIS — I214 Non-ST elevation (NSTEMI) myocardial infarction: Secondary | ICD-10-CM | POA: Diagnosis not present

## 2020-03-09 LAB — CBC WITH DIFFERENTIAL/PLATELET
Abs Immature Granulocytes: 0.03 10*3/uL (ref 0.00–0.07)
Basophils Absolute: 0 10*3/uL (ref 0.0–0.1)
Basophils Relative: 1 %
Eosinophils Absolute: 0.2 10*3/uL (ref 0.0–0.5)
Eosinophils Relative: 3 %
HCT: 39.6 % (ref 39.0–52.0)
Hemoglobin: 13.3 g/dL (ref 13.0–17.0)
Immature Granulocytes: 1 %
Lymphocytes Relative: 30 %
Lymphs Abs: 1.8 10*3/uL (ref 0.7–4.0)
MCH: 29 pg (ref 26.0–34.0)
MCHC: 33.6 g/dL (ref 30.0–36.0)
MCV: 86.3 fL (ref 80.0–100.0)
Monocytes Absolute: 0.5 10*3/uL (ref 0.1–1.0)
Monocytes Relative: 8 %
Neutro Abs: 3.5 10*3/uL (ref 1.7–7.7)
Neutrophils Relative %: 57 %
Platelets: 326 10*3/uL (ref 150–400)
RBC: 4.59 MIL/uL (ref 4.22–5.81)
RDW: 12.7 % (ref 11.5–15.5)
WBC: 6 10*3/uL (ref 4.0–10.5)
nRBC: 0 % (ref 0.0–0.2)

## 2020-03-09 LAB — COMPREHENSIVE METABOLIC PANEL
ALT: 20 U/L (ref 0–44)
AST: 14 U/L — ABNORMAL LOW (ref 15–41)
Albumin: 2.6 g/dL — ABNORMAL LOW (ref 3.5–5.0)
Alkaline Phosphatase: 61 U/L (ref 38–126)
Anion gap: 11 (ref 5–15)
BUN: 14 mg/dL (ref 8–23)
CO2: 23 mmol/L (ref 22–32)
Calcium: 8.7 mg/dL — ABNORMAL LOW (ref 8.9–10.3)
Chloride: 98 mmol/L (ref 98–111)
Creatinine, Ser: 1 mg/dL (ref 0.61–1.24)
GFR, Estimated: >60 mL/min (ref >60)
Glucose, Bld: 143 mg/dL — ABNORMAL HIGH (ref 70–99)
Potassium: 3.7 mmol/L (ref 3.5–5.1)
Sodium: 132 mmol/L — ABNORMAL LOW (ref 135–145)
Total Bilirubin: 0.6 mg/dL (ref 0.3–1.2)
Total Protein: 6.2 g/dL — ABNORMAL LOW (ref 6.5–8.1)

## 2020-03-09 LAB — GLUCOSE, CAPILLARY
Glucose-Capillary: 151 mg/dL — ABNORMAL HIGH (ref 70–99)
Glucose-Capillary: 222 mg/dL — ABNORMAL HIGH (ref 70–99)

## 2020-03-09 LAB — MAGNESIUM: Magnesium: 2 mg/dL (ref 1.7–2.4)

## 2020-03-09 LAB — PHOSPHORUS: Phosphorus: 3.7 mg/dL (ref 2.5–4.6)

## 2020-03-09 MED ORDER — SALINE SPRAY 0.65 % NA SOLN: 1.0000 | NASAL | 0 refills | Status: DC | PRN

## 2020-03-09 MED ORDER — METFORMIN HCL 850 MG PO TABS: 850.0000 mg | ORAL_TABLET | Freq: Two times a day (BID) | ORAL | 0 refills | Status: DC

## 2020-03-09 MED ORDER — ACETAMINOPHEN 325 MG PO TABS: 650.0000 mg | ORAL_TABLET | Freq: Four times a day (QID) | ORAL | 0 refills | Status: DC | PRN

## 2020-03-09 MED ORDER — DOCUSATE SODIUM 100 MG PO CAPS: 100.0000 mg | ORAL_CAPSULE | Freq: Two times a day (BID) | ORAL | 0 refills | Status: DC

## 2020-03-09 MED ORDER — GLIPIZIDE ER 10 MG PO TB24: 10.0000 mg | ORAL_TABLET | Freq: Every day | ORAL | 0 refills | Status: DC

## 2020-03-09 MED ORDER — NICOTINE 21 MG/24HR TD PT24: 21.0000 mg | MEDICATED_PATCH | Freq: Every day | TRANSDERMAL | 0 refills | Status: DC

## 2020-03-09 MED ORDER — TAMSULOSIN HCL 0.4 MG PO CAPS: 0.4000 mg | ORAL_CAPSULE | Freq: Every day | ORAL | 0 refills | Status: DC

## 2020-03-09 MED ORDER — APIXABAN 5 MG PO TABS: ORAL_TABLET | ORAL | 0 refills | Status: DC

## 2020-03-09 MED ORDER — CARVEDILOL 6.25 MG PO TABS: 6.2500 mg | ORAL_TABLET | Freq: Two times a day (BID) | ORAL | 0 refills | Status: DC

## 2020-03-09 MED ORDER — POLYETHYLENE GLYCOL 3350 17 G PO PACK: 17.0000 g | PACK | Freq: Every day | ORAL | 0 refills | Status: DC

## 2020-03-09 MED ORDER — BLOOD GLUCOSE MONITOR KIT: PACK | 0 refills | Status: DC

## 2020-03-09 MED ORDER — ADULT MULTIVITAMIN W/MINERALS CH: 1.0000 | ORAL_TABLET | Freq: Every day | ORAL | 0 refills | Status: DC

## 2020-03-09 MED ORDER — BLOOD GLUCOSE MONITOR KIT: PACK | 0 refills | Status: AC

## 2020-03-09 MED ORDER — CLOPIDOGREL BISULFATE 75 MG PO TABS: 75.0000 mg | ORAL_TABLET | Freq: Every day | ORAL | 0 refills | Status: DC

## 2020-03-09 MED ORDER — THIAMINE HCL 100 MG PO TABS: 100.0000 mg | ORAL_TABLET | Freq: Every day | ORAL | 0 refills | Status: DC

## 2020-03-09 MED ORDER — ASPIRIN 81 MG PO CHEW: 81.0000 mg | CHEWABLE_TABLET | Freq: Every day | ORAL | 0 refills | Status: DC

## 2020-03-09 MED ORDER — SACUBITRIL-VALSARTAN 24-26 MG PO TABS: 1.0000 | ORAL_TABLET | Freq: Two times a day (BID) | ORAL | 0 refills | Status: DC

## 2020-03-09 MED ORDER — FAMOTIDINE 20 MG PO TABS: 20.0000 mg | ORAL_TABLET | Freq: Two times a day (BID) | ORAL | 0 refills | Status: DC

## 2020-03-09 MED ORDER — FOLIC ACID 1 MG PO TABS: 1.0000 mg | ORAL_TABLET | Freq: Every day | ORAL | 0 refills | Status: DC

## 2020-03-09 MED FILL — SM NASAL SPRAY SALINE 0.65: 0.65 | 10 days supply | Qty: 88 | Fill #0

## 2020-03-09 MED FILL — FOLIC ACID 1 MG TABS: 1 | 30 days supply | Qty: 30 | Fill #0

## 2020-03-09 MED FILL — VITAMIN B-1 100 MG TABS: 100 | 30 days supply | Qty: 30 | Fill #0

## 2020-03-09 MED FILL — glipiZIDE ER 10 MG TB24: 10 | 30 days supply | Qty: 30 | Fill #0

## 2020-03-09 MED FILL — CLOPIDOGREL 75 MG TABLET: 75 | 30 days supply | Qty: 30 | Fill #0

## 2020-03-09 MED FILL — METFORMIN HCL 850 MG TABS: 850 | 30 days supply | Qty: 60 | Fill #0

## 2020-03-09 MED FILL — POLYETHYLENE GLYCOL 3350 PO: 17 | 30 days supply | Qty: 510 | Fill #0

## 2020-03-09 MED FILL — ASPIRIN LOW DOSE 81 MG CHEW: 81 | 30 days supply | Qty: 30 | Fill #0

## 2020-03-09 MED FILL — CERTAVITE/ANTIOXIDANTS TABS: 30 days supply | Qty: 30 | Fill #0

## 2020-03-09 MED FILL — TAMSULOSIN HCL 0.4 MG CAP: 0.4 | 30 days supply | Qty: 30 | Fill #0

## 2020-03-09 MED FILL — ENTRESTO 24 MG-26 MG TABLET: 24-26 | 30 days supply | Qty: 60 | Fill #0

## 2020-03-09 MED FILL — CARVEDILOL 6.25 MG TABLET: 6.25 | 30 days supply | Qty: 60 | Fill #0

## 2020-03-09 MED FILL — ACETAMINOPHEN 325 MG TABS: 325 | 5 days supply | Qty: 30 | Fill #0

## 2020-03-09 MED FILL — ELIQUIS 5 MG TABLET: 5 | 30 days supply | Qty: 74 | Fill #0

## 2020-03-09 MED FILL — DOCUSATE SODIUM 100 MG CAPS: 100 | 5 days supply | Qty: 10 | Fill #0

## 2020-03-09 MED FILL — FAMOTIDINE 20 MG TABS: 20 | 30 days supply | Qty: 60 | Fill #0

## 2020-03-09 NOTE — Progress Notes (Signed)
SATURATION QUALIFICATIONS: (This note is used to comply with regulatory documentation for home oxygen)  Patient Saturations on Room Air at Rest = 98%  Patient Saturations on Room Air while Ambulating = 90%  Patient Saturations on n/aLiters of oxygen while Ambulating = n/a%  Please briefly explain why patient needs home oxygen: Pt does not require O2 at home.

## 2020-03-09 NOTE — Progress Notes (Signed)
Called in his multiple vitamins and blood glucometer kit to [facility] [facility] in [place].  Called pt's house to notify him, but unable to leave a message.  Will follow up again later.  Idolina Primer, RN

## 2020-03-09 NOTE — Progress Notes (Signed)
Inpatient Diabetes Program Recommendations  AACE/ADA: New Consensus Statement on Inpatient Glycemic Control (2015)  Target Ranges:  Prepandial:   less than 140 mg/dL      Peak postprandial:   less than 180 mg/dL (1-2 hours)      Critically ill patients:  140 - 180 mg/dL   Lab Results  Component Value Date   GLUCAP 151 (H) 03/09/2020   HGBA1C 11.3 (H) 03/02/2020    Review of Glycemic Control Results for BARTON, WANT (MRN [id number]) as of 03/09/2020 10:50  Ref. Range 03/08/2020 23:04 03/09/2020 08:27  Glucose-Capillary Latest Ref Range: 70 - 99 mg/dL 130 (H) 865 (H)   Diabetes history:  DM2 Outpatient Diabetes medications:  Glipizide 10 mg daily Metformin 850 mg BID Current orders for Inpatient glycemic control:  Novolog 0-15 units TID, Novolog 0-5 units QHS  Inpatient Diabetes Program Recommendations:    Spoke with patient regarding outpatient diabetes management. Patient verifies home meds but reports had not been taking as prescribed; would frequently miss doses. Plans to do better now. Reviewed patient's current A1c of 11.3%. Explained what a A1c is and what it measures. Also reviewed goal A1c with patient, importance of good glucose control @ home, and blood sugar goals. Reviewed patho of DM, role of pancreas, impact on cardiovascular system, current inpatient trends and insulin requirements differing from A1C average, vascular changes and comorbidites. Patient will need a meter at discharge. Blood glucose meter (includes lancets and strips) (#[id number]). Reviewed recommended frequency of checking CBGs and encouraged to take values to next PCP visit.  patient plans to make appointment.  Admits to frequently drinking sugary beverages and states, "I was not doing as I should have been, I have to make some changes". Reviewed alternatives and importance of being mindful of CHO intake. Patient has no further questions at this time.   Thanks, Lujean Rave, MSN,  RNC-OB Diabetes Coordinator [phone] (8a-5p)

## 2020-03-09 NOTE — Discharge Summary (Signed)
Physician Discharge Summary  [name] LHT:[id number] DOB: 1955/03/27 DOA: 03/02/2020  PCP: Ian Riches, NP  Admit date: 03/02/2020 Discharge date: 03/09/2020  Admitted From: Home Disposition: Home with Home Health PT/OT  Recommendations for Outpatient Follow-up:  1. Follow up with PCP in 1-2 weeks 2. Follow up with Cardiology within 1-2 weeks  3. Please obtain CMP/CBC, Mag, Phos in one week 4. Check TFTs within 4-6 weeks 5. Repeat CXR in 3-6 weeks 6. Please follow up on the following pending results:  Home Health: Yes Equipment/Devices: None    Discharge Condition: Stable  CODE STATUS: FULL CODE   Diet recommendation: Heart Healthy Carb Modified Diet   Brief/Interim Summary: The patient is a 65 yo with past medical history significant for essential hypertension, type 2 diabetes, prior CVA and chronic depression who was brought in by ems after witnessed arrest. Pt was with family when he "suddenly locked up" and fell down. cpr was initiated immediately. When ems arrives pt was agonal with respirations but strong carotid pulse. King airway was placed along with IO in [place] and he was transported to [facility]. There was a report of a white powder substance near the patient where he was found down. Patient achieved ROSC without shock or ACLS medications per EMS. He was unresponsive and sedated on vent. Admitted to the ICU. Extubated to nasal cannula on 03/02/2020.  Transferred to [facility] service on 03/03/2020.  Status post heart cath on 1/10/2022by Dr. Saunders Revel which showed 99% lesion distal to the SVG, also moderate-severe lesions in the circumflex. Status post PCI to distal RCA with residual disease to be treated medically. He is currently on aspirin 81 mg daily,Brilinta 90 mg twice daily and Crestor 20 mg daily.  Hospital coursecomplicated by mild hemoptysis in the setting of full dose Lovenox twice daily. Hemoglobin has remained stable.CTA PE done on 03/06/2018 positive right  pulmonary embolism.Started on heparin drip x48 hours.   Patient was reportedly on low-dose Xarelto as outpatient for history of stroke but has not been taking it for quite some time, at least 6 months.  **Interim History He is improving and Cardiology recommending triple Therapy for 1 month and then stopping ASA. Will wean off of O2 and obtain PT/OT evaluation.  PT and OT are recommending home health with intermittent supervision.  He was weaned off the heparin drip to Eliquis p.o. yesterday and monitor him closely as he is still having some Hemoptysis. Hempotysis is imprvoing and very minimal now. Will CXR in the AM stable. He was weaned off of Supplemental O2 via [place]; he ambulated and did not desaturate.  He is cleared from a cardiac perspective and will be going home with home health services.  He will follow-up with cardiology within 1 to 2 weeks.  He stable for discharge at this time.  Discharge Diagnoses:  Active Problems:   Cardiac arrest (HCC)   Non-ST elevation (NSTEMI) myocardial infarction [facility])   Coronary artery disease involving native coronary artery of native heart without angina pectoris   Acute pulmonary embolism without acute cor pulmonale (HCC)   Acute hypoxic and hypercarbic respiratory failureand Respiratory Arrest in the Seting of right-sided pulmonary embolism diagnosed on 03/06/2020. And Polysubstance Abuse -Initially intubated due to cardiac arrest and unresponsiveness -Extubated on 03/02/2020 -He is currently requiring3L to maintain O2 saturation greater than 90% -Wean off oxygen supplementation as tolerated -Personally reviewed chest x-ray done on 03/02/2020 showing atelectasis at the bases bilaterally. No evidence of pulmonary edema or lobular infiltrates. -New  onset hemoptysis and increase in oxygen demand,was on full dose subcu Lovenox until 03/05/2020.  -CTA PE positive for pulmonary embolism, right-sided, no evidence of right heart strain on CT.  -Started  on heparin drip x48 hour and will switch to Eliquis today. He is having some Hemoptysis so will need to watch carefully on Eliquis   -SpO2: 95 % O2 Flow Rate (L/min): 3 L/min FiO2 (%): 40 %; Was weaned off of Supplemental O2 this AM -PT/OT to evaluate and Treat; Will need an Ambulatory Home O2 Screen prior to D/C and will need to be documented -Repeat CXR in the AM; CXR yesterday AM showed "Probable skin fold projects over the periphery of the right lung. Pneumothorax significantly less favored however if there is persisting clinical concern, patient could be repositioned and reimaged portably or an AP and lateral radiograph could be obtained. 2. Previously seen pleural effusions are not well visualized on this radiograph. 3. Streaky opacities with a mid to lower lung predominance are favored to reflect some persistent atelectatic changes." -Continue to Monitor His Hemoptysis as he still has Some  but this is now much improved.  Newly diagnosed pulmonary embolism, suspect present on admission. -Monitor hemoptysis, onset 03/04/20, improved after sq lovenox dose reduced to once a day by cardiology post cath.Monitor H&H. -Management as stated above -Obtained bilateral lower extremity Doppler ultrasound to rule out DVTto r/o DVT as source for pulmonary embolism and was Negative -TOC consulttocheck for insurance coverage of Eliquis; is transition to p.o. Eliquis yesterday and tolerating this well and hemoglobin has gone up and he has had minimal hemoptysis so he is stable to be discharged  Coronary artery disease status post CABG and successful PCI to Distal RCA NSTEMI -Status post cardiac cath on 03/05/2020 which showed 99% lesion distal to the SVG, moderate to severe lesions in the Cx, status post PCI to distal RCA with residual disease to be treated medically. -C/w Management per cardiology -Aspirin 81 mg daily,Brilinta 90 mg twice daily changed to Clopidogrel 75 mg po Daily and Crestor 20 mg  daily. -Cath Showed "Severe native CAD with patent LIMA to LAD, SVG to RCA but with a 99% stenosis in the native RCA beyond the anastomosis" -S/p successful PCI to distal RCA this Admission -Cardiology also increasing Carvedilol to 6.25 mg po BID and adding 75 mg Irbesartan -Defer to cardiology to adjust antiplatelets in the setting of heparin drip.  -Cardiology recommending Triple Therapy x1 Month then Discontinue ASA; Monitor Hgb and Hemoptysis on Eliquis, ASA and Plavix as an outpatient as well given that he is stable to be discharged today  History of CVA -Was started on full dose lovenox while in the ICU,reduced to chemical DVT prophylaxis dose by cardiology post heart cath.  -Patient was reportedly on low-dose Xarelto as outpatient for history of stroke but has not been taking it for quite some time, at least 6 months.  -He is currently on aspirin,now Clopidogrel and off of Brilanta,and Crestor, now on heparin drip.Defer to cardiology to adjust antiplatelets in the setting of anticoagulation and will transition to po Eliquis in the AM   Elevated Troponin, possibly NSTEMIversus PE,suspect present on admission. -Presented after cardiac arrest -Troponin has peaked at 463 and trended down. -2D echo with reduced LVEF 45-50% -Continue aspirin, Coreg 3.125 mg twice daily, Crestor 20 mg daily; Now started on Clopidogrel 75 mg po Daily  -Continue close monitoring -Cardiology following and appreciate further evaluation and recc's  Cardiac arrest, unknown etiology /chest pain/pleuritic pain/newly  diagnosed PE -Seen by Cardiology -2D echo done on 03/02/2020 revealing LVEF 45 to 50% with left ventricle global hypokinesis, grade 1 diastolic dysfunction -IV morphine as needed for severe pain  AKI -Baseline creatinine appears to be 0.8 with GFR 26 -Presented with creatinine of 1.55 with GFR of 50 -Patient's BUN/Cr is now  14/1.00 stable -Continued IV fluid in the setting of recent IV  contrast x48 hours but reduced rate and will stop . -Monitor volume status while on IV fluid and will stop IVF now  -Avoid Nephrotoxic Medications, Contrast Dyes, Hypotension and Renally Adjust medications -Continue to Monitor urine output -Repeat CMP in the AM   Improvement hypervolemic hyponatremia -Serum sodium 130> 131>134 > is again 134 yesterday and today it is 132 and stable -Continue to Monitor and Trend -Repeat CMP in the AM   Acute on Chronic Combined Diastolic and Systolic CHF -Last 2D echo with findings as stated above, LVEF reduced from prior -Continue Coreg,and other cardiacmedications as recommended by cardiology -Closely monitor volume status while on IV fluid and now will stop -Cardiology added Irbesartan 75 mg po Daily and Cardiology increased Carvedilol to 6.25 mg po BID; Cardiology deferring starting Entresto 24/26 BID for now and can discuss starting Entresto in the outpatient setting  -Continuestrict I's and O's and daily weights -Patient is - 5.526 L liters -Continue to Monitor for S/Sx of Volume Overload  Uncontrolled Diabetes Mellitus Type 2 with Hyperglycemia -Hemoglobin A1c 11.3 on 03/02/2020 -ContinueModerate Novolog SSI q4h and no changes to before meals and at bedtime -Continue to Monitor CBG's per Protocol -CBG's ranging from 105-161 -Consulted Diabetes Education Coordinator   Normocytic Anemia -Patient's Hgb/Hct 12.3/34.8 -> 12.1/36.1 and today it is 12.8/37.8 despite him having hemoptysis, has gone up to 13.3/39.6 -Check Anemia Panel and showed an iron level of 59, U IBC of 243, TIBC 302, saturation is over 20%, ferritin level 364, 4 level 12.9, vitamin B12 413 -Continue to Monitor for S/Sx of bleeding as he is on Triple Therapy; Currently no overt Bleeding noted but did have Hemoptysis again today -Repeat CBC in the AM   Acute Toxic Metabolic Encephalopathy -Resolved -UDS +for amphetamine, benzodiazepine, and THC on 03/02/2020. -CT head  neg  Tobacco Abuse -Smoking Cessation Counseling -C/w Nicotine 21 mg TD q24h  HLD -C/w Rosuvastatin 20 mg po qHS  Polysubstance Abuse -UDS + amphetamins, benzo and THC -CIWA protocol for history of benzodiazepine abuse -Polysubstance abuse counseling at bedside -No evidence of benzodiazepine withdrawal at the time of this visit.  Ascending thoracic aorta aneurysm measuring 4.5 x 4.3 cm Slightly enlarged from prior examination in 2019 -Ascending thoracic aortic aneurysm. -Recommend semi-annual imaging followup by CTA or MRA and referral to cardiothoracic surgery if not already obtained. This recommendation follows 2010 ACCF/AHA/AATS/ACR/ASA/SCA/SCAI/SIR/STS/SVM Guidelines. -Provide referral to CTS vs VVS at discharge for follow up.  Minimally displaced fractures of the anterior left 6th rib, anterior right 2nd and 5th ribs likely due to resuscitation in the setting of cardiac arrest. -C/w Pain control with Hydrocodone-Acetaminophen 1 tab po q6hprn -Closely monitor  Obesity -Complicates overall prognosis and care -Estimated body mass index is 30.17 kg/m as calculated from the following:   Height as of this encounter: _0  (1.727 m).   Weight as of this encounter: 90 kg. -Weight Loss and Dietary Counseling given   Discharge Instructions  Discharge Instructions    Amb Referral to Cardiac Rehabilitation   Complete by: As directed    Diagnosis:  Coronary Stents NSTEMI PTCA  After initial evaluation and assessments completed: Virtual Based Care may be provided alone or in conjunction with Phase 2 Cardiac Rehab based on patient barriers.: Yes     Allergies as of 03/09/2020   No Known Allergies     Medication List    STOP taking these medications   Aristada 1064 MG/3.9ML Prsy Generic drug: ARIPiprazole Lauroxil ER   aspirin EC 81 MG tablet Replaced by: aspirin 81 MG chewable tablet   busPIRone 10 MG tablet Commonly known as: BUSPAR   Dialyvite Vitamin  D3 Max 1.25 MG (50000 UT) Tabs Generic drug: Cholecalciferol   mirtazapine 7.5 MG tablet Commonly known as: REMERON   propranolol ER 80 MG 24 hr capsule Commonly known as: INDERAL LA   Trintellix 10 MG Tabs tablet Generic drug: vortioxetine HBr   Xarelto 10 MG Tabs tablet Generic drug: rivaroxaban   zolpidem 5 MG tablet Commonly known as: AMBIEN     TAKE these medications   acetaminophen 325 MG tablet Commonly known as: TYLENOL Take 2 tablets (650 mg total) by mouth every 6 (six) hours as needed for fever.   apixaban 5 MG Tabs tablet Commonly known as: ELIQUIS Take 2 tablets (10 mg total) by mouth 2 (two) times daily for 7 days, THEN 1 tablet (5 mg total) 2 (two) times daily for 24 days. Start taking on: March 15, 2020   aspirin 81 MG chewable tablet Chew 1 tablet (81 mg total) by mouth daily. Start taking on: March 10, 2020 Replaces: aspirin EC 81 MG tablet   blood glucose meter kit and supplies Kit Dispense based on patient and insurance preference. Use up to four times daily as directed. (FOR ICD-9 250.00, 250.01).   carvedilol 6.25 MG tablet Commonly known as: COREG Take 1 tablet (6.25 mg total) by mouth 2 (two) times daily with a meal.   clopidogrel 75 MG tablet Commonly known as: PLAVIX Take 1 tablet (75 mg total) by mouth daily. Start taking on: March 10, 2020   docusate sodium 100 MG capsule Commonly known as: COLACE Take 1 capsule (100 mg total) by mouth 2 (two) times daily.   famotidine 20 MG tablet Commonly known as: PEPCID Take 1 tablet (20 mg total) by mouth 2 (two) times daily.   folic acid 1 MG tablet Commonly known as: FOLVITE Take 1 tablet (1 mg total) by mouth daily. Start taking on: March 10, 2020   gabapentin 800 MG tablet Commonly known as: NEURONTIN Take 800 mg by mouth in the morning, at noon, in the evening, and at bedtime.   glipiZIDE 10 MG 24 hr tablet Commonly known as: GLUCOTROL XL Take 1 tablet (10 mg total) by  mouth daily.   loratadine 10 MG tablet Commonly known as: CLARITIN Take 10 mg by mouth daily.   metFORMIN 850 MG tablet Commonly known as: GLUCOPHAGE Take 1 tablet (850 mg total) by mouth 2 (two) times daily.   multivitamin with minerals Tabs tablet Take 1 tablet by mouth daily. Start taking on: March 10, 2020   nicotine 21 mg/24hr patch Commonly known as: NICODERM CQ - dosed in mg/24 hours Place 1 patch (21 mg total) onto the skin daily. Start taking on: March 10, 2020   polyethylene glycol 17 g packet Commonly known as: MIRALAX / GLYCOLAX Take 17 g by mouth daily. Start taking on: March 10, 2020   rosuvastatin 20 MG tablet Commonly known as: CRESTOR Take 20 mg by mouth at bedtime.   sacubitril-valsartan 24-26 MG Commonly known as:  ENTRESTO Take 1 tablet by mouth 2 (two) times daily.   sodium chloride 0.65 % Soln nasal spray Commonly known as: OCEAN Place 1 spray into both nostrils as needed for congestion.   tamsulosin 0.4 MG Caps capsule Commonly known as: FLOMAX Take 1 capsule (0.4 mg total) by mouth daily after supper.   thiamine 100 MG tablet Take 1 tablet (100 mg total) by mouth daily. Start taking on: March 10, 2020       Follow-up Information    [facility] Follow up.   Specialty: [place] Why: HHPT/OT arranged - they will contact you post discharge to schedule home visits Contact information: [address] [place] [address] [phone]        Fay Records, MD Follow up.   Specialty: Cardiology Why: [place] location - Tuesday Apr 03, 2020 at 8:20 AM (Arrive by 8:05 AM). Contact information: [place] [address] [phone]              No Known Allergies  Consultations:  Pulmonary  Cardiology  Procedures/Studies: CT Head Wo Contrast  Result Date: 03/02/2020 CLINICAL DATA:  Syncope, altered mental status EXAM:  CT HEAD WITHOUT CONTRAST TECHNIQUE: Contiguous axial images were obtained from the base of the skull through the vertex without intravenous contrast. COMPARISON:  MRI 10/17/2013 FINDINGS: Brain: Normal anatomic configuration. Parenchymal volume loss is commensurate with the patient's age. Mild periventricular white matter changes are present likely reflecting the sequela of small vessel ischemia. Remote bilateral cerebellar, left lentiform nucleus, and right pontine infarcts are identified. No abnormal intra or extra-axial mass lesion or fluid collection. No abnormal mass effect or midline shift. No evidence of acute intracranial hemorrhage or infarct. Ventricular size is normal. Cerebellum unremarkable. Vascular: Extensive atherosclerotic calcification is seen within the distal vertebral arteries and the basilar artery appears diminutive. Moderate atherosclerotic calcification noted within the carotid siphons bilaterally. Skull: Intact Sinuses/Orbits: There is moderate mucosal thickening within the right maxillary sinus. Remaining paranasal sinuses are clear. The orbits are unremarkable. Other: Mastoid air cells and middle ear cavities are clear. IMPRESSION: Remote left basal ganglia, pontine, and bilateral cerebellar infarcts. No evidence of acute intracranial hemorrhage or infarct. Extensive intracranial atherosclerotic disease. Moderate right maxillary paranasal sinus disease. Electronically Signed   By: Fidela Salisbury MD   On: 03/02/2020 02:10   CT ANGIO CHEST PE W OR WO CONTRAST  Result Date: 03/06/2020 CLINICAL DATA:  PE suspected, status post cardiac arrest, ongoing chest pain and shortness of breath, oxygen requirement EXAM: CT ANGIOGRAPHY CHEST WITH CONTRAST TECHNIQUE: Multidetector CT imaging of the chest was performed using the standard protocol during bolus administration of intravenous contrast. Multiplanar CT image reconstructions and MIPs were obtained to evaluate the vascular anatomy. CONTRAST:   16m OMNIPAQUE IOHEXOL 350 MG/ML SOLN COMPARISON:  09/10/2007 FINDINGS: Cardiovascular: Satisfactory opacification of the pulmonary arteries to the segmental level. Positive examination for pulmonary embolism with embolus present in the distal right pulmonary artery and lobar to segmental branches of the right lung (series 6, image 136). There is no significant embolus noted in the left lung. Mild, global cardiomegaly. Three-vessel coronary artery calcifications. The RV LV ratio is preserved, less than 0.8. The main pulmonary artery is normal in caliber measuring 2.9 cm. Enlargement of the tubular ascending thoracic aorta, measuring up to 4.5 x 4.3 cm on this non tailored examination. Aortic atherosclerosis. No pericardial effusion. Mediastinum/Nodes: No enlarged mediastinal, hilar,  or axillary lymph nodes. Thyroid gland, trachea, and esophagus demonstrate no significant findings. Lungs/Pleura: Small bilateral pleural effusions and associated atelectasis or consolidation. Upper Abdomen: No acute abnormality. Musculoskeletal: No chest wall abnormality. Minimally displaced fracture of the anterior left sixth rib (series 5, image 84) as well as the anterior right second and fifth ribs (series 5, image 33). Review of the MIP images confirms the above findings. IMPRESSION: 1. Positive examination for pulmonary embolism with embolus present in the distal right pulmonary artery and lobar to segmental branches of the right lung. There is no significant embolus noted in the left lung. 2. Mild, global cardiomegaly. The RV LV ratio is preserved. The main pulmonary artery is normal in caliber. 3. Small bilateral pleural effusions and associated atelectasis or consolidation. 4. Coronary artery disease. 5. Enlargement of the tubular ascending thoracic aorta, measuring up to 4.5 x 4.3 cm on this non tailored examination. This is slightly enlarged compared to prior examination dated 09/09/2017 at which time it measured 4.2 x 4.1  cm. Ascending thoracic aortic aneurysm. Recommend semi-annual imaging followup by CTA or MRA and referral to cardiothoracic surgery if not already obtained. This recommendation follows 2010 ACCF/AHA/AATS/ACR/ASA/SCA/SCAI/SIR/STS/SVM Guidelines for the Diagnosis and Management of Patients With Thoracic Aortic Disease. Circulation. 2010; 121: Y850-Y774. Aortic aneurysm NOS (ICD10-I71.9) 6. Minimally displaced fractures of the anterior left sixth rib as well as the anterior right second and fifth ribs, likely due to resuscitation in the reported setting of cardiac arrest. Aortic Atherosclerosis (ICD10-I70.0). These results were called by telephone at the time of interpretation on 03/06/2020 at 9:37 am to Dr. Irene Pap , who verbally acknowledged these results. Electronically Signed   By: Eddie Candle M.D.   On: 03/06/2020 09:38   CARDIAC CATHETERIZATION  Addendum Date: 03/05/2020   Conclusions: 1. Severe native coronary artery disease with chronic total occlusions of the ostial LAD and RCA as well as 99% stenosis of distal RCA that is supplied by SVG to RCA and multifocal moderate to severe disease disease involving the LCx and OM1. 2. Widely patent LIMA to LAD. 3. Widely patent SVG to distal RCA with 99% stenosis in the native RCA beyond the SVG anastomosis. 4. Upper normal left ventricular filling pressure. 5. Successful PCI to distal RCA through SVG to RCA using Resolute Onyx 2.5 x 12 mm drug-eluting stent (postdilated to 2.9 mm) with 0% residual stenosis and TIMI-3 flow. 6. Incidental note made of occluded proximal right superficial femoral artery on sheath angiogram. Recommendations: 1. Dual antiplatelet therapy with aspirin and ticagrelor for at least 12 months. 2. Aggressive secondary prevention. 3. Favor medical therapy for LCx/OM disease. 4. Optimize goal-directed medical therapy for mildly reduced LVEF. 5. Remove right femoral artery sheath with manual compression 2 hours after discontinuation of  bivalirudin at the end of the procedure. 6. Consider outpatient lower extremity noninvasive vascular testing. Nelva Bush, MD [facility]   Result Date: 03/05/2020 Conclusions: 1. Severe native coronary artery disease with chronic total occlusions of the ostial LAD and RCA as well as 99% stenosis of distal RCA that is supplied by SVG to RCA and multifocal moderate to severe disease disease involving the LCx and OM1. 2. Widely patent LIMA to LAD. 3. Widely patent SVG to distal RCA with 99% stenosis in the native RCA beyond the SVG anastomosis. 4. Upper normal left ventricular filling pressure. 5. Successful PCI to distal RCA through SVG to RCA using Resolute Onyx 2.5 x 12 mm drug-eluting stent (postdilated to 2.9 mm) with 0% residual  stenosis and TIMI-3 flow. Recommendations: 1. Dual antiplatelet therapy with aspirin and ticagrelor for at least 12 months. 2. Aggressive secondary prevention. 3. Favor medical therapy for LCx/OM disease. 4. Optimize goal-directed medical therapy for mildly reduced LVEF. 5. Remove right femoral artery sheath with manual compression 2 hours after discontinuation of bivalirudin at the end of the procedure. Nelva Bush, MD [facility]   DG CHEST PORT 1 VIEW  Result Date: 03/09/2020 CLINICAL DATA:  Short of breath EXAM: PORTABLE CHEST 1 VIEW COMPARISON:  03/08/2020 FINDINGS: Both lungs are clear without infiltrate effusion or pneumothorax. Prior open heart surgery. Negative for heart failure. IMPRESSION: No active disease. Electronically Signed   By: Franchot Gallo M.D.   On: 03/09/2020 08:01   DG CHEST PORT 1 VIEW  Result Date: 03/08/2020 CLINICAL DATA:  Shortness of breath, cough EXAM: PORTABLE CHEST 1 VIEW COMPARISON:  CT 03/06/2020 FINDINGS: Probable skin fold projects over the periphery of the right lung (see annotated image). No clear pneumothorax. Previously seen pleural effusions are not well visualized on this radiograph. Some streaky opacities with a mid to  lower lung predominance are favored to reflect some persistent atelectatic changes. Redemonstrated postsurgical changes from prior sternotomy and CABG with fracture of the inferior most sternal suture, unchanged from comparison imaging. Stable cardiomediastinal contours with a calcified aorta. No acute osseous or soft tissue abnormality. Severe degenerative changes in the left shoulder. Additional multilevel degenerative changes in the spine. Telemetry leads overlie the chest. IMPRESSION: 1. Probable skin fold projects over the periphery of the right lung. Pneumothorax significantly less favored however if there is persisting clinical concern, patient could be repositioned and reimaged portably or an AP and lateral radiograph could be obtained. 2. Previously seen pleural effusions are not well visualized on this radiograph. 3. Streaky opacities with a mid to lower lung predominance are favored to reflect some persistent atelectatic changes. These results will be called to the ordering clinician or representative by the Radiologist Assistant, and communication documented in the PACS or [employer]. Electronically Signed   By: Lovena Le M.D.   On: 03/08/2020 05:48   DG CHEST PORT 1 VIEW  Result Date: 03/03/2020 CLINICAL DATA:  Dyspnea. EXAM: PORTABLE CHEST 1 VIEW COMPARISON:  Radiograph yesterday.  Chest CT 09/09/2017 FINDINGS: Interval extubation and removal of enteric tube. Lower lung volumes. Cardiomegaly is grossly stable. Bronchovascular crowding versus vascular congestion. Patchy bibasilar opacities may represent atelectasis, new. No pneumothorax or pleural effusion. Right apical scarring. Advanced degenerative change of the left shoulder. IMPRESSION: 1. Lower lung volumes after extubation. Stable cardiomegaly. Bronchovascular crowding versus vascular congestion. 2. Patchy bibasilar opacities may represent atelectasis. Electronically Signed   By: Keith Rake M.D.   On: 03/03/2020 23:36   DG  Chest Port 1 View  Result Date: 03/02/2020 CLINICAL DATA:  Respiratory failure EXAM: PORTABLE CHEST 1 VIEW COMPARISON:  09/11/2017 FINDINGS: Endotracheal tube is seen at the level of the clavicular heads, 6.3 cm above the carina. Nasogastric tube extends into the upper abdomen beyond the margin of the examination. Lungs are clear. No pneumothorax or pleural effusion. Cardiac size within normal limits. Pulmonary vascularity is normal. IMPRESSION: Endotracheal tube 6.3 cm above the carina. Nasogastric tube extends into the upper abdomen. No radiographic evidence of acute cardiopulmonary disease. Electronically Signed   By: Fidela Salisbury MD   On: 03/02/2020 01:27   ECHOCARDIOGRAM COMPLETE  Result Date: 03/02/2020    ECHOCARDIOGRAM REPORT   Patient Name:   [name] Date of Exam: 03/02/2020  Medical Rec #:  [id number]          Height:       68.0 in Accession #:    [id number]         Weight:       225.0 lb Date of Birth:  [birth year]         BSA:          2.149 m Patient Age:    65 years           BP:           139/78 mmHg Patient Gender: M                  HR:           96 bpm. Exam Location:  Inpatient Procedure: 2D Echo, Cardiac Doppler and Color Doppler Indications:    Cardiac arrest I46.9  History:        Patient has prior history of Echocardiogram examinations, most                 recent 10/16/2013. Stroke; Risk Factors:Hypertension, Diabetes                 and Current Smoker.  Sonographer:    Vickie Epley RDCS Referring Phys: [id number] [place]  Sonographer Comments: Echo performed with patient supine and on artificial respirator. IMPRESSIONS  1. Left ventricular ejection fraction, by estimation, is 45 to 50%. The left ventricle has mildly decreased function. The left ventricle demonstrates global hypokinesis. There is mild left ventricular hypertrophy. Left ventricular diastolic parameters are consistent with Grade I diastolic dysfunction (impaired relaxation).  2. Right ventricular systolic  function is mildly reduced. The right ventricular size is normal. Tricuspid regurgitation signal is inadequate for assessing PA pressure.  3. The mitral valve is normal in structure. No evidence of mitral valve regurgitation. No evidence of mitral stenosis.  4. The aortic valve is tricuspid. Aortic valve regurgitation is not visualized. No aortic stenosis is present.  5. Aortic dilatation noted. There is mild dilatation of the aortic root, measuring 42 mm.  6. The inferior vena cava is normal in size with greater than 50% respiratory variability, suggesting right atrial pressure of 3 mmHg. FINDINGS  Left Ventricle: Left ventricular ejection fraction, by estimation, is 45 to 50%. The left ventricle has mildly decreased function. The left ventricle demonstrates global hypokinesis. The left ventricular internal cavity size was normal in size. There is  mild left ventricular hypertrophy. Left ventricular diastolic parameters are consistent with Grade I diastolic dysfunction (impaired relaxation). Right Ventricle: The right ventricular size is normal. No increase in right ventricular wall thickness. Right ventricular systolic function is mildly reduced. Tricuspid regurgitation signal is inadequate for assessing PA pressure. Left Atrium: Left atrial size was normal in size. Right Atrium: Right atrial size was normal in size. Pericardium: There is no evidence of pericardial effusion. Mitral Valve: The mitral valve is normal in structure. No evidence of mitral valve regurgitation. No evidence of mitral valve stenosis. Tricuspid Valve: The tricuspid valve is normal in structure. Tricuspid valve regurgitation is not demonstrated. Aortic Valve: The aortic valve is tricuspid. Aortic valve regurgitation is not visualized. No aortic stenosis is present. Pulmonic Valve: The pulmonic valve was normal in structure. Pulmonic valve regurgitation is trivial. Aorta: Aortic dilatation noted. There is mild dilatation of the aortic root,  measuring 42 mm. Venous: The inferior vena cava is normal in size with greater than 50% respiratory variability, suggesting right atrial pressure  of 3 mmHg. IAS/Shunts: No atrial level shunt detected by color flow Doppler.  LEFT VENTRICLE PLAX 2D LVIDd:         4.30 cm     Diastology LVIDs:         3.70 cm     LV e' medial:    3.68 cm/s LV PW:         1.30 cm     LV E/e' medial:  13.0 LV IVS:        1.40 cm     LV e' lateral:   4.20 cm/s LVOT diam:     2.10 cm     LV E/e' lateral: 11.4 LV SV:         36 LV SV Index:   17 LVOT Area:     3.46 cm  LV Volumes (MOD) LV vol d, MOD A2C: 78.9 ml LV vol d, MOD A4C: 95.6 ml LV vol s, MOD A2C: 42.6 ml LV vol s, MOD A4C: 57.8 ml LV SV MOD A2C:     36.3 ml LV SV MOD A4C:     95.6 ml LV SV MOD BP:      36.6 ml RIGHT VENTRICLE RV S prime:     9.75 cm/s TAPSE (M-mode): 1.6 cm LEFT ATRIUM             Index       RIGHT ATRIUM           Index LA diam:        3.20 cm 1.49 cm/m  RA Area:     11.50 cm LA Vol (A2C):   32.0 ml 14.89 ml/m RA Volume:   24.50 ml  11.40 ml/m LA Vol (A4C):   18.0 ml 8.38 ml/m LA Biplane Vol: 24.9 ml 11.59 ml/m  AORTIC VALVE LVOT Vmax:   84.60 cm/s LVOT Vmean:  60.100 cm/s LVOT VTI:    0.103 m  AORTA Ao Root diam: 4.20 cm Ao Asc diam:  4.00 cm MITRAL VALVE MV Area (PHT): 3.93 cm    SHUNTS MV Decel Time: 193 msec    Systemic VTI:  0.10 m MV E velocity: 48.00 cm/s  Systemic Diam: 2.10 cm MV A velocity: 65.80 cm/s MV E/A ratio:  0.73 Loralie Champagne MD Electronically signed by Loralie Champagne MD Signature Date/Time: 03/02/2020/4:15:08 PM    Final    VAS [place] LOWER EXTREMITY VENOUS (DVT)  Result Date: 03/06/2020  Lower Venous DVT Study Indications: Pulmonary embolism.  Comparison Study: 04/23/19 previous Performing Technologist: Abram Sander RVS  Examination Guidelines: A complete evaluation includes B-mode imaging, spectral Doppler, color Doppler, and power Doppler as needed of all accessible portions of each vessel. Bilateral testing is considered an integral  part of a complete examination. Limited examinations for reoccurring indications may be performed as noted. The reflux portion of the exam is performed with the patient in reverse Trendelenburg.  +---------+---------------+---------+-----------+----------+--------------+ RIGHT    CompressibilityPhasicitySpontaneityPropertiesThrombus Aging +---------+---------------+---------+-----------+----------+--------------+ CFV      Full           Yes      Yes                                 +---------+---------------+---------+-----------+----------+--------------+ SFJ      Full                                                        +---------+---------------+---------+-----------+----------+--------------+  FV Prox  Full                                                        +---------+---------------+---------+-----------+----------+--------------+ FV Mid   Full                                                        +---------+---------------+---------+-----------+----------+--------------+ FV DistalFull                                                        +---------+---------------+---------+-----------+----------+--------------+ PFV      Full                                                        +---------+---------------+---------+-----------+----------+--------------+ POP      Full           Yes      Yes                                 +---------+---------------+---------+-----------+----------+--------------+ PTV      Full                                                        +---------+---------------+---------+-----------+----------+--------------+ PERO     Full                                                        +---------+---------------+---------+-----------+----------+--------------+   +---------+---------------+---------+-----------+----------+--------------+ LEFT     CompressibilityPhasicitySpontaneityPropertiesThrombus Aging  +---------+---------------+---------+-----------+----------+--------------+ CFV      Full           Yes      Yes                                 +---------+---------------+---------+-----------+----------+--------------+ SFJ      Full                                                        +---------+---------------+---------+-----------+----------+--------------+ FV Prox  Full                                                        +---------+---------------+---------+-----------+----------+--------------+  FV Mid   Full                                                        +---------+---------------+---------+-----------+----------+--------------+ FV DistalFull                                                        +---------+---------------+---------+-----------+----------+--------------+ PFV      Full                                                        +---------+---------------+---------+-----------+----------+--------------+ POP      Full           Yes      Yes                                 +---------+---------------+---------+-----------+----------+--------------+ PTV      Full                                                        +---------+---------------+---------+-----------+----------+--------------+ PERO     Full                                                        +---------+---------------+---------+-----------+----------+--------------+     Summary: BILATERAL: - No evidence of deep vein thrombosis seen in the lower extremities, bilaterally. - No evidence of superficial venous thrombosis in the lower extremities, bilaterally. -No evidence of popliteal cyst, bilaterally.   *See table(s) above for measurements and observations. Electronically signed by Deitra Mayo MD on 03/06/2020 at 2:19:49 PM.    Final      Procedures:  Left heart cath 03/05/20: Conclusions: 1. Severe native coronary artery disease with chronic total  occlusions of the ostial LAD and RCA as well as 99% stenosis of distal RCA that is supplied by SVG to RCA and multifocal moderate to severe disease disease involving the LCx and OM1. 2. Widely patent LIMA to LAD. 3. Widely patent SVG to distal RCA with 99% stenosis in the native RCA beyond the SVG anastomosis. 4. Upper normal left ventricular filling pressure. 5. Successful PCI to distal RCA through SVG to RCA using Resolute Onyx 2.5 x 12 mm drug-eluting stent (postdilated to 2.9 mm) with 0% residual stenosis and TIMI-3 flow. 6. Incidental note made of occluded proximal right superficial femoral artery on sheath angiogram.  Recommendations: 1. Dual antiplatelet therapy with aspirin and ticagrelor for at least 12 months. 2. Aggressive secondary prevention. 3. Favor medical therapy for LCx/OM disease. 4. Optimize goal-directed medical therapy for mildly reduced LVEF. 5. Remove right femoral artery sheath with manual compression  2 hours after discontinuation of bivalirudin at the end of the procedure. 6. Consider outpatient lower extremity noninvasive vascular testing.   Echo 03/02/20: 1. Left ventricular ejection fraction, by estimation, is 45 to 50%. The  left ventricle has mildly decreased function. The left ventricle  demonstrates global hypokinesis. There is mild left ventricular  hypertrophy. Left ventricular diastolic parameters  are consistent with Grade I diastolic dysfunction (impaired relaxation).  2. Right ventricular systolic function is mildly reduced. The right  ventricular size is normal. Tricuspid regurgitation signal is inadequate  for assessing PA pressure.  3. The mitral valve is normal in structure. No evidence of mitral valve  regurgitation. No evidence of mitral stenosis.  4. The aortic valve is tricuspid. Aortic valve regurgitation is not  visualized. No aortic stenosis is present.  5. Aortic dilatation noted. There is mild dilatation of the aortic root,   measuring 42 mm.  6. The inferior vena cava is normal in size with greater than 50%  respiratory variability, suggesting right atrial pressure of 3 mmHg.    Subjective: Seen and examined at bedside and he is doing much better.  Hemoptysis is very minimal.  Continues to have some chest discomfort but is not too bad.  Ambulated without issue and did not desaturate.  Stable for discharge and cardiology is cleared him and he will follow-up with them within 1 to 2 weeks.  Discharge Exam: Vitals:   03/09/20 0830 03/09/20 1102  BP: (!) 126/93   Pulse: 95 96  Resp: 15 17  Temp:    SpO2: 94% 95%   Vitals:   03/09/20 0103 03/09/20 0500 03/09/20 0830 03/09/20 1102  BP: (!) 147/95 (!) 161/96 (!) 126/93   Pulse: 84 88 95 96  Resp: _0 Temp: 98 F (36.7 C) 98.5 F (36.9 C)    TempSrc: Oral Oral    SpO2: 93% 92% 94% 95%  Weight:      Height:       General: Pt is alert, awake, not in acute distress Cardiovascular: RRR, S1/S2 +, no rubs, no gallops Respiratory: Diminished bilaterally, no wheezing, no rhonchi Abdominal: Soft, NT, slightly distended, bowel sounds + Extremities: No appreciable extremity edema, no cyanosis  The results of significant diagnostics from this hospitalization (including imaging, microbiology, ancillary and laboratory) are listed below for reference.    Microbiology: Recent Results (from the past 240 hour(s))  Resp Panel by RT-PCR (Flu A&B, Covid) Nasopharyngeal Swab     Status: None   Collection Time: 03/02/20 12:59 AM   Specimen: Nasopharyngeal Swab; Nasopharyngeal(NP) swabs in vial transport medium  Result Value Ref Range Status   SARS Coronavirus 2 by RT PCR NEGATIVE NEGATIVE Final    Comment: (NOTE) SARS-CoV-2 target nucleic acids are NOT DETECTED.  The SARS-CoV-2 RNA is generally detectable in upper respiratory specimens during the acute phase of infection. The lowest concentration of SARS-CoV-2 viral copies this assay can detect is 138  copies/mL. A negative result does not preclude SARS-Cov-2 infection and should not be used as the sole basis for treatment or other patient management decisions. A negative result may occur with  improper specimen collection/handling, submission of specimen other than nasopharyngeal swab, presence of viral mutation(s) within the areas targeted by this assay, and inadequate number of viral copies(<138 copies/mL). A negative result must be combined with clinical observations, patient history, and epidemiological information. The expected result is Negative.  Fact Sheet for Patients:  EntrepreneurPulse.com.au  Fact Sheet for Healthcare Providers:  IncredibleEmployment.be  This test is no t yet approved or cleared by the [place] and  has been authorized for detection and/or diagnosis of SARS-CoV-2 by FDA under an Emergency Use Authorization (EUA). This EUA will remain  in effect (meaning this test can be used) for the duration of the COVID-19 declaration under Section 564(b)(1) of the Act, 21 U.S.C.section 360bbb-3(b)(1), unless the authorization is terminated  or revoked sooner.       Influenza A by PCR NEGATIVE NEGATIVE Final   Influenza B by PCR NEGATIVE NEGATIVE Final    Comment: (NOTE) The Xpert Xpress SARS-CoV-2/FLU/RSV plus assay is intended as an aid in the diagnosis of influenza from Nasopharyngeal swab specimens and should not be used as a sole basis for treatment. Nasal washings and aspirates are unacceptable for Xpert Xpress SARS-CoV-2/FLU/RSV testing.  Fact Sheet for Patients: EntrepreneurPulse.com.au  Fact Sheet for Healthcare Providers: IncredibleEmployment.be  This test is not yet approved or cleared by the [place] FDA and has been authorized for detection and/or diagnosis of SARS-CoV-2 by FDA under an Emergency Use Authorization (EUA). This EUA will remain in effect (meaning  this test can be used) for the duration of the COVID-19 declaration under Section 564(b)(1) of the Act, 21 U.S.C. section 360bbb-3(b)(1), unless the authorization is terminated or revoked.  Performed at [facility], [place] [address] [place], [place] [address]     Labs: BNP (last 3 results) Recent Labs    03/04/20 1336  BNP 60.4   Basic Metabolic Panel: Recent Labs  Lab 03/04/20 1336 03/05/20 0221 03/06/20 0238 03/07/20 0426 03/08/20 0227 03/09/20 0126  NA  --  131* 134* 133* 134* 132*  K  --  3.8 4.0 3.8 3.7 3.7  CL  --  93* 95* 99 99 98  CO2  --  _0 GLUCOSE  --  175* 157* 136* 125* 143*  BUN  --  _1 CREATININE  --  0.95 1.02 0.94 0.92 1.00  CALCIUM  --  8.8* 8.7* 8.3* 8.5* 8.7*  MG 1.7  --   --   --   --  2.0  PHOS 3.3  --   --   --   --  3.7   Liver Function Tests: Recent Labs  Lab 03/06/20 0238 03/09/20 0126  AST 16 14*  ALT 20 20  ALKPHOS 62 61  BILITOT 0.2* 0.6  PROT 6.0* 6.2*  ALBUMIN 2.4* 2.6*   No results for input(s): LIPASE, AMYLASE in the last 168 hours. No results for input(s): AMMONIA in the last 168 hours. CBC: Recent Labs  Lab 03/04/20 1336 03/06/20 0238 03/07/20 0426 03/08/20 0227 03/09/20 0126  WBC 5.9 4.7 5.2 5.8 6.0  NEUTROABS  --   --   --   --  3.5  HGB 13.6 12.3* 12.1* 12.8* 13.3  HCT 39.4 34.8* 36.1* 37.8* 39.6  MCV 86.2 85.7 87.4 85.9 86.3  PLT 172 218 230 265 326   Cardiac Enzymes: No results for input(s): CKTOTAL, CKMB, CKMBINDEX, TROPONINI in the last 168 hours. BNP: Invalid input(s): POCBNP CBG: Recent Labs  Lab 03/08/20 1212 03/08/20 1714 03/08/20 2106 03/08/20 2304 03/09/20 0827  GLUCAP 145* 157* 212* 148* 151*   D-Dimer No results for input(s): DDIMER in the last 72 hours. Hgb A1c No results for input(s): HGBA1C in the last 72 hours. Lipid Profile No results for input(s): CHOL, HDL, LDLCALC, TRIG, CHOLHDL, LDLDIRECT in the last 72 hours. Thyroid function  studies No  results for input(s): TSH, T4TOTAL, T3FREE, THYROIDAB in the last 72 hours.  Invalid input(s): FREET3 Anemia work up Recent Labs    03/08/20 1808  VITAMINB12 413  FOLATE 12.9  FERRITIN 364*  TIBC 302  IRON 59  RETICCTPCT 2.0   Urinalysis    Component Value Date/Time   COLORURINE YELLOW 03/02/2020 [place] 03/02/2020 0355   LABSPEC 1.032 (H) 03/02/2020 0355   PHURINE 6.0 03/02/2020 0355   GLUCOSEU >=500 (A) 03/02/2020 0355   HGBUR NEGATIVE 03/02/2020 0355   BILIRUBINUR NEGATIVE 03/02/2020 0355   KETONESUR NEGATIVE 03/02/2020 0355   PROTEINUR NEGATIVE 03/02/2020 0355   UROBILINOGEN 0.2 10/16/2013 0837   NITRITE NEGATIVE 03/02/2020 0355   LEUKOCYTESUR NEGATIVE 03/02/2020 0355   Sepsis Labs Invalid input(s): PROCALCITONIN,  WBC,  LACTICIDVEN Microbiology Recent Results (from the past 240 hour(s))  Resp Panel by RT-PCR (Flu A&B, Covid) Nasopharyngeal Swab     Status: None   Collection Time: 03/02/20 12:59 AM   Specimen: Nasopharyngeal Swab; Nasopharyngeal(NP) swabs in vial transport medium  Result Value Ref Range Status   SARS Coronavirus 2 by RT PCR NEGATIVE NEGATIVE Final    Comment: (NOTE) SARS-CoV-2 target nucleic acids are NOT DETECTED.  The SARS-CoV-2 RNA is generally detectable in upper respiratory specimens during the acute phase of infection. The lowest concentration of SARS-CoV-2 viral copies this assay can detect is 138 copies/mL. A negative result does not preclude SARS-Cov-2 infection and should not be used as the sole basis for treatment or other patient management decisions. A negative result may occur with  improper specimen collection/handling, submission of specimen other than nasopharyngeal swab, presence of viral mutation(s) within the areas targeted by this assay, and inadequate number of viral copies(<138 copies/mL). A negative result must be combined with clinical observations, patient history, and epidemiological information.  The expected result is Negative.  Fact Sheet for Patients:  EntrepreneurPulse.com.au  Fact Sheet for Healthcare Providers:  IncredibleEmployment.be  This test is no t yet approved or cleared by the [place] FDA and  has been authorized for detection and/or diagnosis of SARS-CoV-2 by FDA under an Emergency Use Authorization (EUA). This EUA will remain  in effect (meaning this test can be used) for the duration of the COVID-19 declaration under Section 564(b)(1) of the Act, 21 U.S.C.section 360bbb-3(b)(1), unless the authorization is terminated  or revoked sooner.       Influenza A by PCR NEGATIVE NEGATIVE Final   Influenza B by PCR NEGATIVE NEGATIVE Final    Comment: (NOTE) The Xpert Xpress SARS-CoV-2/FLU/RSV plus assay is intended as an aid in the diagnosis of influenza from Nasopharyngeal swab specimens and should not be used as a sole basis for treatment. Nasal washings and aspirates are unacceptable for Xpert Xpress SARS-CoV-2/FLU/RSV testing.  Fact Sheet for Patients: EntrepreneurPulse.com.au  Fact Sheet for Healthcare Providers: IncredibleEmployment.be  This test is not yet approved or cleared by the [place] FDA and has been authorized for detection and/or diagnosis of SARS-CoV-2 by FDA under an Emergency Use Authorization (EUA). This EUA will remain in effect (meaning this test can be used) for the duration of the COVID-19 declaration under Section 564(b)(1) of the Act, 21 U.S.C. section 360bbb-3(b)(1), unless the authorization is terminated or revoked.  Performed at [facility], [place] [address], [place], [place] [address]    Time coordinating discharge: 35 minutes  SIGNED:  Kerney Elbe, DO [facility] 03/09/2020, 12:36 PM Pager is on [place]  If 7PM-7AM,  please contact night-coverage [email or web address]

## 2020-03-09 NOTE — Progress Notes (Signed)
Reviewed with pt Plavix, smoking cessation, walking and cutting out regular soda. Pt receptive. Sts he wants to make some changes and is feeling more confident than he has before.  [id number] Ethelda Chick CES, ACSM 2:12 PM 03/09/2020

## 2020-03-09 NOTE — Discharge Instructions (Signed)
Information on my medicine - ELIQUIS (apixaban)  Why was Eliquis prescribed for you? Eliquis was prescribed to treat blood clots that may have been found in the veins of your legs (deep vein thrombosis) or in your lungs (pulmonary embolism) and to reduce the risk of them occurring again.  What do You need to know about Eliquis ? The starting dose is 10 mg (two 5 mg tablets) taken TWICE daily for the FIRST SEVEN (7) DAYS, then on 03/16/20 the dose is reduced to ONE 5 mg tablet taken TWICE daily.  Eliquis may be taken with or without food.   Try to take the dose about the same time in the morning and in the evening. If you have difficulty swallowing the tablet whole please discuss with your pharmacist how to take the medication safely.  Take Eliquis exactly as prescribed and DO NOT stop taking Eliquis without talking to the doctor who prescribed the medication.  Stopping may increase your risk of developing a new blood clot.  Refill your prescription before you run out.  After discharge, you should have regular check-up appointments with your healthcare provider that is prescribing your Eliquis.    What do you do if you miss a dose? If a dose of ELIQUIS is not taken at the scheduled time, take it as soon as possible on the same day and twice-daily administration should be resumed. The dose should not be doubled to make up for a missed dose.  Important Safety Information A possible side effect of Eliquis is bleeding. You should call your healthcare provider right away if you experience any of the following: ? Bleeding from an injury or your nose that does not stop. ? Unusual colored urine (red or dark brown) or unusual colored stools (red or black). ? Unusual bruising for unknown reasons. ? A serious fall or if you hit your head (even if there is no bleeding).  Some medicines may interact with Eliquis and might increase your risk of bleeding or clotting while on Eliquis. To help avoid  this, consult your healthcare provider or pharmacist prior to using any new prescription or non-prescription medications, including herbals, vitamins, non-steroidal anti-inflammatory drugs (NSAIDs) and supplements.  This website has more information on Eliquis (apixaban): http://www.eliquis.com/eliquis/home

## 2020-03-09 NOTE — Progress Notes (Signed)
Physical Therapy Treatment Patient Details Name: [name] MRN: [id number] DOB: 03/06/1955 Today's Date: 03/09/2020    History of Present Illness 65 yo with past medical history significant for essential hypertension, type 2 diabetes, prior CVA and chronic depression who was brought in by ems after witnessed arrest.    PT Comments    Pt supine in bed on arrival.  Pt with orders to d/c home.  Performed short bout of gt training with mild DOE.  SPO2 95% on RA.  Once back in room reviewed standing exercises for HEP use at home.  Encouraged patient to continue ambulation at home.  Pt sitting edge of bed with OT and RN at end of session.      Follow Up Recommendations  Home health PT;Supervision - Intermittent     Equipment Recommendations       Recommendations for Other Services       Precautions / Restrictions Precautions Precautions: Fall    Mobility  Bed Mobility Overal bed mobility: Modified Independent Bed Mobility: Supine to Sit   Sidelying to sit: Supervision       General bed mobility comments: Pt able to move into sitting unsupported this session.  Transfers Overall transfer level: Needs assistance Equipment used: Rolling walker (2 wheeled) Transfers: Sit to/from Stand Sit to Stand: Supervision         General transfer comment: poor eccentric load and hand placement to seated surface.  Ambulation/Gait Ambulation/Gait assistance: Supervision Gait Distance (Feet): 120 Feet Assistive device: Rolling walker (2 wheeled) Gait Pattern/deviations: Step-through pattern;Decreased stride length Gait velocity: decr   General Gait Details: Assist for safety. No loss of balance   Stairs             Wheelchair Mobility    Modified Rankin (Stroke Patients Only)       Balance Overall balance assessment: Needs assistance Sitting-balance support: No upper extremity supported;Feet supported Sitting balance-Leahy Scale: Good       Standing  balance-Leahy Scale: Fair                              Cognition Arousal/Alertness: Awake/alert Behavior During Therapy: Flat affect;WFL for tasks assessed/performed Overall Cognitive Status: Within Functional Limits for tasks assessed                                        Exercises Other Exercises Other Exercises: Performed 2-3 reps for clarification of HEP issued; Including: heel raises, marching, mini squats and hip abduction.  Medbridge access code: [email or web address]    General Comments        Pertinent Vitals/Pain Pain Assessment: Faces Faces Pain Scale: Hurts little more Pain Location: rib cage and sternum Pain Descriptors / Indicators: Guarding;Grimacing Pain Intervention(s): Monitored during session;Repositioned    Home Living                      Prior Function            PT Goals (current goals can now be found in the care plan section) Acute Rehab PT Goals Patient Stated Goal: to go home Potential to Achieve Goals: Fair Progress towards PT goals: Progressing toward goals    Frequency    Min 3X/week      PT Plan Current plan remains appropriate    Co-evaluation  AM-PAC PT "6 Clicks" Mobility   Outcome Measure  Help needed turning from your back to your side while in a flat bed without using bedrails?: None Help needed moving from lying on your back to sitting on the side of a flat bed without using bedrails?: None Help needed moving to and from a bed to a chair (including a wheelchair)?: A Little Help needed standing up from a chair using your arms (e.g., wheelchair or bedside chair)?: A Little Help needed to walk in hospital room?: A Little Help needed climbing 3-5 steps with a railing? : A Little 6 Click Score: 20    End of Session Equipment Utilized During Treatment: Gait belt Activity Tolerance: Patient tolerated treatment well Patient left: with call bell/phone within reach;in bed (Sitting  edge of bed with OT post session.) Nurse Communication: Mobility status PT Visit Diagnosis: Muscle weakness (generalized) (M62.81);Other abnormalities of gait and mobility (R26.89) Pain - part of body:  (chest)     Time: 8657-8469 PT Time Calculation (min) (ACUTE ONLY): 13 min  Charges:  $Therapeutic Activity: 8-22 mins                     Bonney Leitz , PTA Acute Rehabilitation Services Pager [phone] Office [phone]     Mirabelle Cyphers Artis Delay 03/09/2020, 2:04 PM

## 2020-03-09 NOTE — Progress Notes (Signed)
Pt provided clothing to wear home as he is to discharge today. Able to dress himself. Educated pt in continued use of IS when he returns home.    03/09/20 1451  OT Visit Information  Last OT Received On 03/09/20  Assistance Needed +1  History of Present Illness 65 yo with past medical history significant for essential hypertension, type 2 diabetes, prior CVA and chronic depression who was brought in by ems after witnessed arrest.  Precautions  Precautions Fall  Pain Assessment  Pain Assessment Faces  Faces Pain Scale 4  Pain Location rib cage and sternum  Pain Descriptors / Indicators Guarding;Grimacing  Pain Intervention(s) Monitored during session  Cognition  Arousal/Alertness Awake/alert  Behavior During Therapy Flat affect  Overall Cognitive Status Within Functional Limits for tasks assessed  ADL  Overall ADL's  Modified independent  General ADL Comments provided clothing from case management department for pt to wear home, no clothing in hospital  Bed Mobility  Overal bed mobility Modified Independent  Balance  Sitting balance-Leahy Scale Good  Standing balance-Leahy Scale Fair  Standing balance comment no LOB with LB dressing  Transfers  Overall transfer level Needs assistance  Equipment used 4-wheeled walker  Sit to Stand Supervision  General transfer comment cues to control descent  OT - End of Session  Equipment Utilized During Treatment Rolling walker  Activity Tolerance Patient tolerated treatment well  Patient left in bed;with call bell/phone within reach;with nursing/sitter in room  OT Assessment/Plan  OT Plan Discharge plan remains appropriate  OT Visit Diagnosis Pain  OT Frequency (ACUTE ONLY) Min 2X/week  Follow Up Recommendations Home health OT  OT Equipment None recommended by OT  AM-PAC OT "6 Clicks" Daily Activity Outcome Measure (Version 2)  Help from another person eating meals? 4  Help from another person taking care of personal grooming? 4  Help  from another person toileting, which includes using toliet, bedpan, or urinal? 4  Help from another person bathing (including washing, rinsing, drying)? 4  Help from another person to put on and taking off regular upper body clothing? 4  Help from another person to put on and taking off regular lower body clothing? 4  6 Click Score 24  OT Goal Progression  Progress towards OT goals Progressing toward goals  Acute Rehab OT Goals  Patient Stated Goal to go home  OT Goal Formulation With patient  Time For Goal Achievement 03/17/20  Potential to Achieve Goals Good  OT Time Calculation  OT Start Time (ACUTE ONLY) 1320  OT Stop Time (ACUTE ONLY) 1335  OT Time Calculation (min) 15 min  OT General Charges  $OT Visit 1 Visit  OT Treatments  $Self Care/Home Management  8-22 mins  Martie Round, OTR/L Acute Rehabilitation Services Pager: [phone] Office: [phone]

## 2020-03-09 NOTE — Plan of Care (Signed)

## 2020-03-09 NOTE — Care Management (Signed)
03-09-20 1402 Case Manager spoke with patient and he left his wallet at home and has no family support. Patient states he is unable to pay for his medications via [facility]. Case Manager used petty cash to pay for medications. Medications have been delivered to the room and paid for. Patient will have transportation via safe transport. Case Manager will assist with getting that set up. Case Manager will continue to follow for any additional needs. Gala Lewandowsky, RN,BSN Case Manager

## 2020-03-09 NOTE — Progress Notes (Signed)
Heart Failure Stewardship Pharmacist Progress Note   PCP: Dema Severin, NP PCP-Cardiologist: Dietrich Pates, MD    HPI:  65 yo M with PMH of CAD s/p CABG in 1992, CVA, HTN, DM2, CKD II-III, tobacco use, depression, and history of illicit drug use. He was admitted for cardiac vs respiratory arrest. CTA positive for PE. An ECHO was done on 03/02/20 and LVEF was mildly reduced to 45-50% (was 50-55% in August 2015).   Current HF Medications: Carvedilol 6.25 mg BID Entresto 24/26 mg BID  Prior to admission HF Medications: None  Pertinent Lab Values: . Serum creatinine 1.00, BUN 14, Potassium 3.7, Sodium 132, BNP 52, Magnesium 2.0  Vital Signs: . Weight: 198 lbs (admission weight: 208 lbs) . Blood pressure: 150-170/80-90s  . Heart rate: 90s   Medication Assistance / Insurance Benefits Check: Does the patient have prescription insurance?  Yes Type of insurance plan: [employer]  Outpatient Pharmacy:  Prior to admission outpatient pharmacy: [facility] Is the patient willing to use [facility] TOC pharmacy at discharge? Yes   Assessment: 1. Chronic diastolic CHF (EF 25-63%), due to ICM. NYHA class II symptoms. - Continue carvedilol 6.25 mg BID - Continue Entresto 24/26 mg BID (indicated for LVEF <57%)  - Consider starting Jardiance 10 mg daily for both HFpEF and DM (A1c 11.3) prior to discharge, however, with history of non-adherence this may be better initiated outpatient once patient is amenable to other medication additions   Plan: 1) Medication changes recommended at this time: - No recommendations to make at this time   2) Patient assistance application(s): - None pending - Eliqiuis copay $4 per month - Entresto copay $4 per month  3)  Education  - Patient has been educated on current HF medications (carvedilol, Entresto) and potential additions to HF medication regimen (Jardiance) - Patient verbalizes understanding that over the next few months, these medication  doses may change and more medications may be added to optimize HF regimen - Patient has been educated on basic disease state pathophysiology and goals of therapy - Time spent (30 mins)   Sharen Hones, PharmD, BCPS Heart Failure Stewardship Pharmacist Phone [phone]

## 2020-03-09 NOTE — Progress Notes (Addendum)
Progress Note  Patient Name: [name] Date of Encounter: 03/09/2020  Primary Cardiologist: Dietrich Pates, MD  Subjective   Still with mild hemoptysis but not as pronounced as it was before. Hgb wnl. Chest pain on coughing but no anginal pressure or dyspnea. Feels weak.  Inpatient Medications    Scheduled Meds: . apixaban  10 mg Oral BID   Followed by  . [START ON 03/15/2020] apixaban  5 mg Oral BID  . aspirin  81 mg Oral Daily  . carvedilol  6.25 mg Oral BID WC  . clopidogrel  75 mg Oral Daily  . docusate sodium  100 mg Oral BID  . famotidine  20 mg Oral BID  . folic acid  1 mg Oral Daily  .  HYDROmorphone (DILAUDID) injection  0.5 mg Intravenous Once  . insulin aspart  0-15 Units Subcutaneous TID WC  . insulin aspart  0-5 Units Subcutaneous QHS  . lidocaine  1 application Urethral Once  . multivitamin with minerals  1 tablet Oral Daily  . nicotine  21 mg Transdermal Daily  . polyethylene glycol  17 g Oral Daily  . rosuvastatin  20 mg Oral QHS  . sacubitril-valsartan  1 tablet Oral BID  . sodium chloride flush  3 mL Intravenous Q12H  . tamsulosin  0.4 mg Oral QPC supper  . thiamine  100 mg Oral Daily   Or  . thiamine  100 mg Intravenous Daily   Continuous Infusions: . sodium chloride     PRN Meds: sodium chloride, acetaminophen, docusate sodium, HYDROcodone-acetaminophen, ipratropium-albuterol, loperamide, morphine injection, ondansetron (ZOFRAN) IV, polyethylene glycol, sodium chloride, sodium chloride flush   Vital Signs    Vitals:   03/09/20 0103 03/09/20 0500 03/09/20 0830 03/09/20 1102  BP: (!) 147/95 (!) 161/96 (!) 126/93   Pulse: 84 88 95 96  Resp: 18 16 15 17   Temp: 98 F (36.7 C) 98.5 F (36.9 C)    TempSrc: Oral Oral    SpO2: 93% 92% 94% 95%  Weight:      Height:        Intake/Output Summary (Last 24 hours) at 03/09/2020 1135 Last data filed at 03/09/2020 0100 Gross per 24 hour  Intake 3 ml  Output 875 ml  Net -872 ml   Last 3  Weights 03/08/2020 03/07/2020 03/06/2020  Weight (lbs) 198 lb 6.6 oz 198 lb 10.2 oz 201 lb 3.2 oz  Weight (kg) 90 kg 90.1 kg 91.264 kg  Some encounter information is confidential and restricted. Go to Review Flowsheets activity to see all data.     Telemetry    NSR - Personally Reviewed  Physical Exam   GEN: No acute distress.  HEENT: Normocephalic, atraumatic, sclera non-icteric. Neck: No JVD or bruits. Cardiac: RRR no murmurs, rubs, or gallops.  Radials/DP/PT 1+ and equal bilaterally.  Respiratory: Clear to auscultation bilaterally. Breathing is unlabored. GI: Soft, nontender, non-distended, BS +x 4. MS: no deformity. Extremities: No clubbing or cyanosis. No edema. Distal pedal pulses are 2+ and equal bilaterally. Neuro:  AAOx3. Follows commands. Psych:  Responds to questions appropriately with a normal affect.  Labs    High Sensitivity Troponin:   Recent Labs  Lab 03/02/20 0058 03/02/20 0358 03/02/20 1024 03/02/20 1442  TROPONINIHS 23* 102* 463* 311*      Cardiac EnzymesNo results for input(s): TROPONINI in the last 168 hours. No results for input(s): TROPIPOC in the last 168 hours.   Chemistry Recent Labs  Lab 03/06/20 0238 03/07/20 0426 03/08/20  0227 03/09/20 0126  NA 134* 133* 134* 132*  K 4.0 3.8 3.7 3.7  CL 95* 99 99 98  CO2 27 22 23 23   GLUCOSE 157* 136* 125* 143*  BUN 13 10 11 14   CREATININE 1.02 0.94 0.92 1.00  CALCIUM 8.7* 8.3* 8.5* 8.7*  PROT 6.0*  --   --  6.2*  ALBUMIN 2.4*  --   --  2.6*  AST 16  --   --  14*  ALT 20  --   --  20  ALKPHOS 62  --   --  61  BILITOT 0.2*  --   --  0.6  GFRNONAA >60 >60 >60 >60  ANIONGAP 12 12 12 11      Hematology Recent Labs  Lab 03/07/20 0426 03/08/20 0227 03/08/20 1808 03/09/20 0126  WBC 5.2 5.8  --  6.0  RBC 4.13* 4.40 4.96 4.59  HGB 12.1* 12.8*  --  13.3  HCT 36.1* 37.8*  --  39.6  MCV 87.4 85.9  --  86.3  MCH 29.3 29.1  --  29.0  MCHC 33.5 33.9  --  33.6  RDW 12.6 12.5  --  12.7  PLT 230 265   --  326    BNP Recent Labs  Lab 03/04/20 1336  BNP 52.4     DDimer No results for input(s): DDIMER in the last 168 hours.   Radiology    DG CHEST PORT 1 VIEW  Result Date: 03/09/2020 CLINICAL DATA:  Short of breath EXAM: PORTABLE CHEST 1 VIEW COMPARISON:  03/08/2020 FINDINGS: Both lungs are clear without infiltrate effusion or pneumothorax. Prior open heart surgery. Negative for heart failure. IMPRESSION: No active disease. Electronically Signed   By: 05/02/20 M.D.   On: 03/09/2020 08:01   DG CHEST PORT 1 VIEW  Result Date: 03/08/2020 CLINICAL DATA:  Shortness of breath, cough EXAM: PORTABLE CHEST 1 VIEW COMPARISON:  CT 03/06/2020 FINDINGS: Probable skin fold projects over the periphery of the right lung (see annotated image). No clear pneumothorax. Previously seen pleural effusions are not well visualized on this radiograph. Some streaky opacities with a mid to lower lung predominance are favored to reflect some persistent atelectatic changes. Redemonstrated postsurgical changes from prior sternotomy and CABG with fracture of the inferior most sternal suture, unchanged from comparison imaging. Stable cardiomediastinal contours with a calcified aorta. No acute osseous or soft tissue abnormality. Severe degenerative changes in the left shoulder. Additional multilevel degenerative changes in the spine. Telemetry leads overlie the chest. IMPRESSION: 1. Probable skin fold projects over the periphery of the right lung. Pneumothorax significantly less favored however if there is persisting clinical concern, patient could be repositioned and reimaged portably or an AP and lateral radiograph could be obtained. 2. Previously seen pleural effusions are not well visualized on this radiograph. 3. Streaky opacities with a mid to lower lung predominance are favored to reflect some persistent atelectatic changes. These results will be called to the ordering clinician or representative by the Radiologist  Assistant, and communication documented in the PACS or 03/11/2020. Electronically Signed   By: 03/10/2020 M.D.   On: 03/08/2020 05:48    Cardiac Studies   2D echo 03/02/20 1. Left ventricular ejection fraction, by estimation, is 45 to 50%. The  left ventricle has mildly decreased function. The left ventricle  demonstrates global hypokinesis. There is mild left ventricular  hypertrophy. Left ventricular diastolic parameters  are consistent with Grade I diastolic dysfunction (impaired relaxation).  2. Right  ventricular systolic function is mildly reduced. The right  ventricular size is normal. Tricuspid regurgitation signal is inadequate  for assessing PA pressure.  3. The mitral valve is normal in structure. No evidence of mitral valve  regurgitation. No evidence of mitral stenosis.  4. The aortic valve is tricuspid. Aortic valve regurgitation is not  visualized. No aortic stenosis is present.  5. Aortic dilatation noted. There is mild dilatation of the aortic root,  measuring 42 mm.  6. The inferior vena cava is normal in size with greater than 50%  respiratory variability, suggesting right atrial pressure of 3 mmHg.   Cardiac Cath 03/05/20 Conclusions: 1. Severe native coronary artery disease with chronic total occlusions of the ostial LAD and RCA as well as 99% stenosis of distal RCA that is supplied by SVG to RCA and multifocal moderate to severe disease disease involving the LCx and OM1. 2. Widely patent LIMA to LAD. 3. Widely patent SVG to distal RCA with 99% stenosis in the native RCA beyond the SVG anastomosis. 4. Upper normal left ventricular filling pressure. 5. Successful PCI to distal RCA through SVG to RCA using Resolute Onyx 2.5 x 12 mm drug-eluting stent (postdilated to 2.9 mm) with 0% residual stenosis and TIMI-3 flow. 6. Incidental note made of occluded proximal right superficial femoral artery on sheath angiogram.  Recommendations: 1. Dual antiplatelet  therapy with aspirin and ticagrelor for at least 12 months. 2. Aggressive secondary prevention. 3. Favor medical therapy for LCx/OM disease. 4. Optimize goal-directed medical therapy for mildly reduced LVEF. 5. Remove right femoral artery sheath with manual compression 2 hours after discontinuation of bivalirudin at the end of the procedure. 6. Consider outpatient lower extremity noninvasive vascular testing.  Yvonne Kendallhristopher End, MD [facility]   Patient Profile        65 y.o.malewith CAD s/p CABG [facility] without cardiac follow-up recently, CVA, HTN, DM2, CKD stage II-III, tobacco use, depression, aortic root and abdominal aneurysm (followed by [facility]), and history of illicit drug use admitted with cardiac vs respiratory arrest. CPR initiated, agonal breathing on arrival to ER, ROSC achieved without shock or ACLS. Possibly found with white powder nearby.  2D echo showed mild LV dysfunction and workup showed elevated troponins. Pt denied use of amphetamines for several months. UDS + amphetamines, benzos, THC. Cardiology saw patient for elevation in troponin with resultant cath/PCI as above. Also found to have persistent hypoxia/hemoptysis with + PE.   Assessment & Plan    1. Out of hospital cardiac vs respiratory arrestwith acute hypoxic respiratory failure and hemoptysis with newly diagnosed PE - pt did not recall events leading to event - had CPR but no shock or ACLS meds per notes - agree with ED that the fact that spontaneous circulation was achieved without medication or electricity suggests that he never truly lost circulation - cardiac cath performed as below which could not account for the entire event therefore further workup for hypoxia/hemoptysis demonstrated + PE (venous duplex negative for DVT) - management of PE/hemoptysis per IM - will need primary care follow-up for this - UDS + amphetamines, benzos, THC  2. CAD s/p CABGwith possible NSTEMI - s/p cath as above by  Dr. Tenny Crawoss. Per Dr. Okey DupreEnd, culprit looked to be distal RCA where there was a 99% lesion distal to the SVG - also moderate-severe lesions in the Cx. S/p PCI to dRCA with residual disease to be treated medically - was placed on Brilinta in addition to home ASA post cath - due  to finding of PE and need for DOAC, Brilinta changed to Plavix with plan for triple therapy for 1 month then drop aspirin - 2D echo with EF 45-50%, mildly reduced RV function, no pulmonary HTN - statin initiated this admission - If the patient is tolerating statin at time of follow-up appointment, would consider rechecking liver function/lipid panel in 6-8 weeks.  3. Cardiomyopathy, possibly ischemic - EF 45-50% by echo - continue BB - Entresto added 03/07/20  4. Essential HTN - SBP acceptable, follow in context of the above  5. Acute kidney injury - admit Cr 1.55, improved and remains stable  6. H/o polysubstance use - UDS as above;patientdenied use of amphetamines for several months although admit notes do report pt was found with some sort of white powder - cessation advised  7. Aortic root and abdominal aneurysm - 37mm dilation of aortic root by echothis admission - 4.5x4.3 enlargement of tubular ascending thoracic aorta - recommend to continue to follow with [facility] as outpatient  Anticipate sign off today. Pt has f/u with Dr. Tenny Craw scheduled 04/03/20 - last day of aspirin would be through 04/05/20. (Will review final recs with MD given continued mild hemoptysis.)  For questions or updates, please contact [facility] Please consult [email or web address] for contact info under Cardiology/STEMI.  Signed, Laurann Montana, PA-C 03/09/2020, 11:35 AM    Attending Note:   The patient was seen and examined.  Agree with assessment and plan as noted above.  Changes made to the above note as needed.  Patient seen and independently examined with Ronie Spies, PA .   We discussed all aspects of the encounter. I agree with the  assessment and plan as stated above.  1.  CAD :  No angina ,  S/p stenting of his distal RCA   2. Cardiac arrest:    Has been found to have a pulmonary embolus.  Also poly substance abuse. His RCA stenosis is so small that I do not think it was responsible for his out of hospital arrest.   3.   HTN:   Stable   [facility] will sign off.   Medication Recommendations:  Cont medical therapy  Other recommendations (labs, testing, etc):   Follow up as an outpatient:  With Dr.  Tenny Craw or APP in a month      I have spent a total of 40 minutes with patient reviewing hospital  notes , telemetry, EKGs, labs and examining patient as well as establishing an assessment and plan that was discussed with the patient. > 50% of time was spent in direct patient care.    Vesta Mixer, Montez Hageman., MD, [facility] 03/09/2020, 1:51 PM [address]  [address] Office [phone] Pager [phone]

## 2020-03-19 DIAGNOSIS — F39 Unspecified mood [affective] disorder: Secondary | ICD-10-CM | POA: Diagnosis not present

## 2020-03-19 DIAGNOSIS — E119 Type 2 diabetes mellitus without complications: Secondary | ICD-10-CM | POA: Diagnosis not present

## 2020-03-19 DIAGNOSIS — G8929 Other chronic pain: Secondary | ICD-10-CM | POA: Diagnosis not present

## 2020-03-19 DIAGNOSIS — I5043 Acute on chronic combined systolic (congestive) and diastolic (congestive) heart failure: Secondary | ICD-10-CM | POA: Diagnosis not present

## 2020-03-19 DIAGNOSIS — I2510 Atherosclerotic heart disease of native coronary artery without angina pectoris: Secondary | ICD-10-CM | POA: Diagnosis not present

## 2020-03-19 DIAGNOSIS — R5383 Other fatigue: Secondary | ICD-10-CM | POA: Diagnosis not present

## 2020-03-19 DIAGNOSIS — M5442 Lumbago with sciatica, left side: Secondary | ICD-10-CM | POA: Diagnosis not present

## 2020-03-19 DIAGNOSIS — F418 Other specified anxiety disorders: Secondary | ICD-10-CM | POA: Diagnosis not present

## 2020-03-19 DIAGNOSIS — I10 Essential (primary) hypertension: Secondary | ICD-10-CM | POA: Diagnosis not present

## 2020-03-19 DIAGNOSIS — E669 Obesity, unspecified: Secondary | ICD-10-CM | POA: Diagnosis not present

## 2020-03-21 ENCOUNTER — Telehealth (HOSPITAL_COMMUNITY): Payer: Self-pay

## 2020-03-21 ENCOUNTER — Encounter (HOSPITAL_COMMUNITY): Payer: Self-pay

## 2020-03-21 NOTE — Telephone Encounter (Signed)
Per phase I pt unsure about wanting to do cardiac rehab unable to reach pt and unable to leave VM. Mailed letter.

## 2020-04-03 ENCOUNTER — Ambulatory Visit: Payer: Medicare Other | Admitting: Internal Medicine

## 2020-04-05 NOTE — Telephone Encounter (Signed)
No response from pt.  Closed referral  

## 2020-04-16 ENCOUNTER — Encounter: Payer: Medicare Other | Admitting: Cardiothoracic Surgery

## 2020-04-17 ENCOUNTER — Telehealth: Payer: Self-pay | Admitting: *Deleted

## 2020-04-17 NOTE — Telephone Encounter (Signed)
Called to r/s appointment with Dr Alisa Graff called [name] answer

## 2020-05-04 ENCOUNTER — Telehealth: Payer: Self-pay | Admitting: *Deleted

## 2020-05-04 NOTE — Telephone Encounter (Signed)
tried to call numerous times to r/s appt that was missed but no answer/no vm

## 2020-06-13 ENCOUNTER — Other Ambulatory Visit: Payer: Self-pay

## 2020-06-13 ENCOUNTER — Emergency Department (HOSPITAL_COMMUNITY): Payer: Medicare Other

## 2020-06-13 ENCOUNTER — Encounter (HOSPITAL_COMMUNITY): Payer: Self-pay

## 2020-06-13 ENCOUNTER — Emergency Department (HOSPITAL_COMMUNITY)
Admission: EM | Admit: 2020-06-13 | Discharge: 2020-06-15 | Disposition: A | Discharge status: Home / Self Care | Payer: Medicare Other | Attending: Emergency Medicine | Admitting: Emergency Medicine

## 2020-06-13 DIAGNOSIS — F17210 Nicotine dependence, cigarettes, uncomplicated: Secondary | ICD-10-CM | POA: Diagnosis not present

## 2020-06-13 DIAGNOSIS — Z7901 Long term (current) use of anticoagulants: Secondary | ICD-10-CM | POA: Insufficient documentation

## 2020-06-13 DIAGNOSIS — M545 Low back pain, unspecified: Secondary | ICD-10-CM | POA: Diagnosis not present

## 2020-06-13 DIAGNOSIS — R2681 Unsteadiness on feet: Secondary | ICD-10-CM | POA: Diagnosis not present

## 2020-06-13 DIAGNOSIS — S80812A Abrasion, left lower leg, initial encounter: Secondary | ICD-10-CM | POA: Insufficient documentation

## 2020-06-13 DIAGNOSIS — Z7984 Long term (current) use of oral hypoglycemic drugs: Secondary | ICD-10-CM | POA: Insufficient documentation

## 2020-06-13 DIAGNOSIS — Z7982 Long term (current) use of aspirin: Secondary | ICD-10-CM | POA: Insufficient documentation

## 2020-06-13 DIAGNOSIS — Z7902 Long term (current) use of antithrombotics/antiplatelets: Secondary | ICD-10-CM | POA: Diagnosis not present

## 2020-06-13 DIAGNOSIS — W19XXXA Unspecified fall, initial encounter: Secondary | ICD-10-CM | POA: Insufficient documentation

## 2020-06-13 DIAGNOSIS — Z20822 Contact with and (suspected) exposure to covid-19: Secondary | ICD-10-CM | POA: Diagnosis not present

## 2020-06-13 DIAGNOSIS — R5381 Other malaise: Secondary | ICD-10-CM | POA: Diagnosis not present

## 2020-06-13 DIAGNOSIS — Z79899 Other long term (current) drug therapy: Secondary | ICD-10-CM | POA: Diagnosis not present

## 2020-06-13 DIAGNOSIS — I10 Essential (primary) hypertension: Secondary | ICD-10-CM | POA: Insufficient documentation

## 2020-06-13 DIAGNOSIS — F419 Anxiety disorder, unspecified: Secondary | ICD-10-CM | POA: Insufficient documentation

## 2020-06-13 DIAGNOSIS — I2510 Atherosclerotic heart disease of native coronary artery without angina pectoris: Secondary | ICD-10-CM | POA: Insufficient documentation

## 2020-06-13 DIAGNOSIS — S8011XA Contusion of right lower leg, initial encounter: Secondary | ICD-10-CM | POA: Insufficient documentation

## 2020-06-13 DIAGNOSIS — R531 Weakness: Secondary | ICD-10-CM | POA: Diagnosis not present

## 2020-06-13 DIAGNOSIS — E119 Type 2 diabetes mellitus without complications: Secondary | ICD-10-CM | POA: Insufficient documentation

## 2020-06-13 DIAGNOSIS — R000 Tachycardia, unspecified: Secondary | ICD-10-CM | POA: Diagnosis not present

## 2020-06-13 DIAGNOSIS — S8991XA Unspecified injury of right lower leg, initial encounter: Secondary | ICD-10-CM | POA: Diagnosis present

## 2020-06-13 DIAGNOSIS — M5136 Other intervertebral disc degeneration, lumbar region: Secondary | ICD-10-CM | POA: Diagnosis not present

## 2020-06-13 DIAGNOSIS — M47816 Spondylosis without myelopathy or radiculopathy, lumbar region: Secondary | ICD-10-CM | POA: Diagnosis not present

## 2020-06-13 LAB — CBC WITH DIFFERENTIAL/PLATELET
Abs Immature Granulocytes: 0.01 10*3/uL (ref 0.00–0.07)
Basophils Absolute: 0.1 10*3/uL (ref 0.0–0.1)
Basophils Relative: 1 %
Eosinophils Absolute: 0.1 10*3/uL (ref 0.0–0.5)
Eosinophils Relative: 2 %
HCT: 40 % (ref 39.0–52.0)
Hemoglobin: 13.4 g/dL (ref 13.0–17.0)
Immature Granulocytes: 0 %
Lymphocytes Relative: 19 %
Lymphs Abs: 1.4 10*3/uL (ref 0.7–4.0)
MCH: 28.5 pg (ref 26.0–34.0)
MCHC: 33.5 g/dL (ref 30.0–36.0)
MCV: 85.1 fL (ref 80.0–100.0)
Monocytes Absolute: 0.5 10*3/uL (ref 0.1–1.0)
Monocytes Relative: 8 %
Neutro Abs: 5.1 10*3/uL (ref 1.7–7.7)
Neutrophils Relative %: 70 %
Platelets: 362 10*3/uL (ref 150–400)
RBC: 4.7 MIL/uL (ref 4.22–5.81)
RDW: 14.4 % (ref 11.5–15.5)
WBC: 7.1 10*3/uL (ref 4.0–10.5)
nRBC: 0 % (ref 0.0–0.2)

## 2020-06-13 LAB — COMPREHENSIVE METABOLIC PANEL
ALT: 13 U/L (ref 0–44)
AST: 16 U/L (ref 15–41)
Albumin: 3.3 g/dL — ABNORMAL LOW (ref 3.5–5.0)
Alkaline Phosphatase: 96 U/L (ref 38–126)
Anion gap: 9 (ref 5–15)
BUN: 16 mg/dL (ref 8–23)
CO2: 26 mmol/L (ref 22–32)
Calcium: 9.4 mg/dL (ref 8.9–10.3)
Chloride: 96 mmol/L — ABNORMAL LOW (ref 98–111)
Creatinine, Ser: 1.13 mg/dL (ref 0.61–1.24)
GFR, Estimated: >60 mL/min (ref >60)
Glucose, Bld: 125 mg/dL — ABNORMAL HIGH (ref 70–99)
Potassium: 4 mmol/L (ref 3.5–5.1)
Sodium: 131 mmol/L — ABNORMAL LOW (ref 135–145)
Total Bilirubin: 0.6 mg/dL (ref 0.3–1.2)
Total Protein: 7.7 g/dL (ref 6.5–8.1)

## 2020-06-13 LAB — CK: Total CK: 44 U/L — ABNORMAL LOW (ref 49–397)

## 2020-06-13 LAB — LACTIC ACID, PLASMA: Lactic Acid, Venous: 1.3 mmol/L (ref 0.5–1.9)

## 2020-06-13 LAB — TSH: TSH: 1.723 u[IU]/mL (ref 0.350–4.500)

## 2020-06-13 LAB — CBG MONITORING, ED: Glucose-Capillary: 115 mg/dL — ABNORMAL HIGH (ref 70–99)

## 2020-06-13 MED ORDER — GABAPENTIN 800 MG PO TABS
800.0000 mg | ORAL_TABLET | Freq: Three times a day (TID) | ORAL | Status: DC
  Filled 2020-06-13: qty 1

## 2020-06-13 MED ORDER — ROSUVASTATIN CALCIUM 20 MG PO TABS
20.0000 mg | ORAL_TABLET | Freq: Every day | ORAL | Status: DC
  Administered 2020-06-14 (×2): 20 mg via ORAL
  Filled 2020-06-13 (×2): qty 1

## 2020-06-13 MED ORDER — FAMOTIDINE 20 MG PO TABS
20.0000 mg | ORAL_TABLET | Freq: Two times a day (BID) | ORAL | Status: DC
  Administered 2020-06-14 – 2020-06-15 (×4): 20 mg via ORAL
  Filled 2020-06-13 (×4): qty 1

## 2020-06-13 MED ORDER — ASPIRIN 81 MG PO CHEW
81.0000 mg | CHEWABLE_TABLET | Freq: Every day | ORAL | Status: DC
  Administered 2020-06-13 – 2020-06-15 (×3): 81 mg via ORAL
  Filled 2020-06-13 (×3): qty 1

## 2020-06-13 MED ORDER — THIAMINE HCL 100 MG PO TABS
50.0000 mg | ORAL_TABLET | Freq: Every day | ORAL | Status: DC
  Administered 2020-06-13 – 2020-06-15 (×3): 50 mg via ORAL
  Filled 2020-06-13 (×3): qty 1

## 2020-06-13 MED ORDER — CLOPIDOGREL BISULFATE 75 MG PO TABS
75.0000 mg | ORAL_TABLET | Freq: Every day | ORAL | Status: DC
  Administered 2020-06-13 – 2020-06-15 (×3): 75 mg via ORAL
  Filled 2020-06-13 (×3): qty 1

## 2020-06-13 MED ORDER — ACETAMINOPHEN 325 MG PO TABS: 650.0000 mg | ORAL_TABLET | Freq: Four times a day (QID) | ORAL | Status: DC | PRN

## 2020-06-13 MED ORDER — METFORMIN HCL 850 MG PO TABS
850.0000 mg | ORAL_TABLET | Freq: Two times a day (BID) | ORAL | Status: DC
  Administered 2020-06-14 – 2020-06-15 (×3): 850 mg via ORAL
  Filled 2020-06-13 (×5): qty 1

## 2020-06-13 MED ORDER — GABAPENTIN 300 MG PO CAPS
800.0000 mg | ORAL_CAPSULE | Freq: Three times a day (TID) | ORAL | Status: DC
  Administered 2020-06-14 – 2020-06-15 (×5): 800 mg via ORAL
  Filled 2020-06-13 (×5): qty 2

## 2020-06-13 MED ORDER — APIXABAN 5 MG PO TABS
5.0000 mg | ORAL_TABLET | Freq: Two times a day (BID) | ORAL | Status: DC
  Administered 2020-06-14 – 2020-06-15 (×4): 5 mg via ORAL
  Filled 2020-06-13 (×5): qty 1

## 2020-06-13 MED ORDER — SACUBITRIL-VALSARTAN 24-26 MG PO TABS
1.0000 | ORAL_TABLET | Freq: Two times a day (BID) | ORAL | Status: DC
  Administered 2020-06-14 – 2020-06-15 (×4): 1 via ORAL
  Filled 2020-06-13 (×6): qty 1

## 2020-06-13 MED ORDER — GLIPIZIDE ER 10 MG PO TB24
10.0000 mg | ORAL_TABLET | Freq: Every day | ORAL | Status: DC
  Administered 2020-06-14 – 2020-06-15 (×3): 10 mg via ORAL
  Filled 2020-06-13 (×4): qty 1

## 2020-06-13 MED ORDER — LACTATED RINGERS IV BOLUS
1000.0000 mL | Freq: Once | INTRAVENOUS | Status: AC
  Administered 2020-06-13: 1000 mL via INTRAVENOUS

## 2020-06-13 MED ORDER — CARVEDILOL 12.5 MG PO TABS
6.2500 mg | ORAL_TABLET | Freq: Two times a day (BID) | ORAL | Status: DC
  Administered 2020-06-14 – 2020-06-15 (×3): 6.25 mg via ORAL
  Filled 2020-06-13 (×3): qty 1

## 2020-06-13 NOTE — ED Notes (Signed)
Patient transported to MRI 

## 2020-06-13 NOTE — ED Provider Notes (Signed)
[facility] EMERGENCY DEPARTMENT Provider Note   CSN: [id number] Arrival date & time: 06/13/20  1130     History Chief Complaint  Patient presents with  . Weakness    [name] is a 65 y.o. male.  4-year-old male with extensive past medical history below including CAD, PE on anticoagulation, CVA, type 2 diabetes mellitus who presents with weakness.  Patient is currently living in a motel.  He states that over the past 2 weeks, he has had persistent bilateral leg weakness causing him to have difficulty walking and have several falls.  He feels like the weakness began relatively suddenly and has been persistent.  He has no arm weakness.  He has some pins-and-needles sensation of his feet from his diabetes but no change in this recently.  No associated saddle anesthesia or incontinence.  With his falls, he states that he has scraped up his legs but he denies any head injury or loss of consciousness.  He reports some back pain in his lower back, has history of rod in his back.  States that he has had back pain previously but this feels a [place] bit different.  He denies any fevers, vomiting, IV drug use, headache, or vision changes.  He has not been eating as much because he has not wanted to try to get up to go to the bathroom.  The history is provided by the patient.  Weakness      Past Medical History:  Diagnosis Date  . Depression   . Diabetes mellitus without complication ([place])   . Hypertension   . Stroke [facility])     Patient Active Problem List   Diagnosis Date Noted  . Acute pulmonary embolism without acute cor pulmonale (HCC)   . Non-ST elevation (NSTEMI) myocardial infarction ([place])   . Coronary artery disease involving native coronary artery of native heart without angina pectoris   . Cardiac arrest ([place]) 03/02/2020  . Severe episode of recurrent major depressive disorder, without psychotic features ([place])   . Opioid use disorder, severe, in  sustained remission ([place]) 05/25/2015  . Benzodiazepine abuse ([place]) 05/25/2015  . Cannabis use disorder, moderate, dependence ([place]) 05/25/2015  . MDD (major depressive disorder), recurrent episode, severe ([place]) 05/23/2015  . Chronic pain syndrome 05/23/2015  . GAD (generalized anxiety disorder) 05/23/2015  . CVA (cerebral infarction) 10/15/2013  . Hypertension 10/15/2013  . Diabetes mellitus ([place]) 10/15/2013    Past Surgical History:  Procedure Laterality Date  . ABDOMINAL AORTOGRAM W/LOWER EXTREMITY Bilateral 05/02/2019   Procedure: ABDOMINAL AORTOGRAM W/LOWER EXTREMITY;  Surgeon: Waynetta Sandy, MD;  Location: [facility] CV LAB;  Service: Cardiovascular;  Laterality: Bilateral;  . CORONARY STENT INTERVENTION N/A 03/05/2020   Procedure: CORONARY STENT INTERVENTION;  Surgeon: Nelva Bush, MD;  Location: [facility] CV LAB;  Service: Cardiovascular;  Laterality: N/A;  . LEFT HEART CATH AND CORS/GRAFTS ANGIOGRAPHY N/A 03/05/2020   Procedure: LEFT HEART CATH AND CORS/GRAFTS ANGIOGRAPHY;  Surgeon: Nelva Bush, MD;  Location: [facility] CV LAB;  Service: Cardiovascular;  Laterality: N/A;       History reviewed. No pertinent family history.  Social History   Tobacco Use  . Smoking status: Current Every Day Smoker    Packs/day: 1.00    Types: Cigarettes  . Smokeless tobacco: Never Used  Substance Use Topics  . Alcohol use: No  . Drug use: Yes    Types: Marijuana, Cocaine    Home Medications Prior to Admission medications   Medication Sig Start  Date End Date Taking? Authorizing Provider  Accu-Chek Softclix Lancets lancets USE AS DIRECTED 03/09/20 03/09/21 Yes Sheikh, Omair Latif, DO  acetaminophen (TYLENOL) 325 MG tablet TAKE 2 TABLETS (650 MG TOTAL) BY MOUTH EVERY SIX HOURS AS NEEDED FOR FEVER. Patient taking differently: Take 650 mg by mouth every 6 (six) hours as needed for moderate pain or fever. 03/09/20 03/09/21 Yes Sheikh, Omair Latif, DO  apixaban (ELIQUIS) 5  MG TABS tablet TAKE 2 TABLETS (10 MG TOTAL) BY MOUTH TWO TIMES DAILY FOR 7 DAYS, THEN 1 TABLET (5 MG TOTAL) TWO TIMES DAILY FOR 24 DAYS. Patient taking differently: Take 5 mg by mouth 2 (two) times daily. 03/09/20 03/09/21 Yes Sheikh, Omair Latif, DO  aspirin 81 MG chewable tablet CHEW 1 TABLET (81 MG TOTAL) BY MOUTH DAILY. Patient taking differently: Chew 81 mg by mouth daily. 03/09/20 03/09/21 Yes Sheikh, [place], DO  blood glucose meter kit and supplies KIT Dispense based on patient and insurance preference. Use up to four times daily as directed. (FOR ICD-9 250.00, 250.01). 03/09/20  Yes Sheikh, Omair Latif, DO  Blood Glucose Monitoring Suppl (ACCU-CHEK GUIDE) w/Device KIT USE AS DIRECTED 03/09/20 03/09/21 Yes Sheikh, Omair Latif, DO  carvedilol (COREG) 6.25 MG tablet TAKE 1 TABLET (6.25 MG TOTAL) BY MOUTH TWO TIMES DAILY WITH A MEAL. Patient taking differently: Take 6.25 mg by mouth 2 (two) times daily with a meal. 03/09/20 03/09/21 Yes Sheikh, Omair Latif, DO  clopidogrel (PLAVIX) 75 MG tablet TAKE 1 TABLET (75 MG TOTAL) BY MOUTH DAILY. Patient taking differently: Take 75 mg by mouth daily. 03/09/20 03/09/21 Yes Sheikh, Omair Latif, DO  famotidine (PEPCID) 20 MG tablet TAKE 1 TABLET (20 MG TOTAL) BY MOUTH TWO TIMES DAILY. Patient taking differently: Take 20 mg by mouth 2 (two) times daily. 03/09/20 03/09/21 Yes Sheikh, Omair Latif, DO  gabapentin (NEURONTIN) 800 MG tablet Take 800 mg by mouth in the morning, at noon, in the evening, and at bedtime.  04/24/19  Yes [provider]  glipiZIDE (GLUCOTROL XL) 10 MG 24 hr tablet TAKE 1 TABLET (10 MG TOTAL) BY MOUTH DAILY. Patient taking differently: Take 10 mg by mouth daily. 03/09/20 03/09/21 Yes Sheikh, [place], DO  glucose blood test strip USE AS DIRECTED FOUR TIMES DAILY Patient taking differently: 4 (four) times daily. as directed 03/09/20 03/09/21 Yes Sheikh, [place], DO  metFORMIN (GLUCOPHAGE) 850 MG tablet TAKE 1 TABLET (850 MG TOTAL) BY  MOUTH TWO TIMES DAILY. Patient taking differently: Take 850 mg by mouth 2 (two) times daily with a meal. 03/09/20 03/09/21 Yes Sheikh, Omair Latif, DO  rosuvastatin (CRESTOR) 20 MG tablet Take 20 mg by mouth at bedtime. 04/24/19  Yes [provider]  sacubitril-valsartan (ENTRESTO) 24-26 MG TAKE 1 TABLET BY MOUTH TWO TIMES DAILY. 03/09/20 03/09/21 Yes Sheikh, Omair Latif, DO  thiamine 100 MG tablet TAKE 1 TABLET (100 MG TOTAL) BY MOUTH DAILY. Patient taking differently: Take 50 mg by mouth daily. 03/09/20 03/09/21 Yes Raiford Noble Latif, DO    Allergies    Patient has no known allergies.  Review of Systems   Review of Systems  Neurological: Positive for weakness.   All other systems reviewed and are negative except that which was mentioned in HPI  Physical Exam Updated Vital Signs BP 122/84   Pulse 99   Temp 99 F (37.2 C) (Oral)   Resp 17   Ht $R'5\' 10"'Jy$  (1.778 m)   Wt 86.2 kg   SpO2 96%   BMI 27.26 kg/m  Physical Exam Constitutional:      General: He is not in acute distress.    Appearance: Normal appearance.     Comments: Disheveled, anxious  HENT:     Head: Normocephalic and atraumatic.  Eyes:     Conjunctiva/sclera: Conjunctivae normal.  Cardiovascular:     Rate and Rhythm: Regular rhythm. Tachycardia present.     Pulses: Normal pulses.     Heart sounds: Normal heart sounds. No murmur heard.   Pulmonary:     Effort: Pulmonary effort is normal.     Breath sounds: Normal breath sounds.  Abdominal:     General: Abdomen is flat. Bowel sounds are normal. There is no distension.     Palpations: Abdomen is soft.     Tenderness: There is no abdominal tenderness.  Musculoskeletal:     Right lower leg: No edema.     Left lower leg: No edema.  Skin:    General: Skin is warm and dry.  Neurological:     Mental Status: He is alert and oriented to person, place, and time.     Comments: Fluent speech, CN II-XII intact, 5/5 strength BUE 4/5 strength LLE 3/5 strength  RLE Sensation intact b/l feet  Psychiatric:        Behavior: Behavior normal.     Comments: anxious     ED Results / Procedures / Treatments   Labs (all labs ordered are listed, but only abnormal results are displayed) Labs Reviewed  COMPREHENSIVE METABOLIC PANEL - Abnormal; Notable for the following components:      Result Value   Sodium 131 (*)    Chloride 96 (*)    Glucose, Bld 125 (*)    Albumin 3.3 (*)    All other components within normal limits  CK - Abnormal; Notable for the following components:   Total CK 44 (*)    All other components within normal limits  CBG MONITORING, ED - Abnormal; Notable for the following components:   Glucose-Capillary 115 (*)    All other components within normal limits  CBC WITH DIFFERENTIAL/PLATELET  TSH  LACTIC ACID, PLASMA    EKG EKG Interpretation  Date/Time:  Wednesday June 13 2020 11:41:14 EDT Ventricular Rate:  114 PR Interval:  141 QRS Duration: 103 QT Interval:  324 QTC Calculation: 447 R Axis:   86 Text Interpretation: Sinus tachycardia Probable left atrial enlargement Borderline right axis deviation Repol abnrm, severe global ischemia (LM/MVD) deep T wave inversions in inferior and precordial leads compared to previous Confirmed by Theotis Burrow [phone]) on 06/13/2020 12:25:12 PM   Radiology DG Lumbar Spine Complete  Result Date: 06/13/2020 CLINICAL DATA:  Low back pain and BILATERAL leg weakness for 2 weeks, no acute injury EXAM: LUMBAR SPINE - COMPLETE 4+ VIEW COMPARISON:  07/06/2013 FINDINGS: Osseous demineralization. Five non-rib-bearing lumbar vertebra with partial sacralization of L5. Prior posterior fusion L4-L5. Scattered disc space narrowing and endplate spur formation. Vertebral body heights maintained. Minimal retrolisthesis L3-L4 and L2-L3. No fracture or bone destruction. No spondylolysis. SI joints preserved. Scattered atherosclerotic calcifications including aorta. IMPRESSION: Degenerative changes of the  lumbar spine with prior posterior fusion L4-L5. No acute osseous abnormalities. Aortic Atherosclerosis (ICD10-I70.0). Electronically Signed   By: Lavonia Dana M.D.   On: 06/13/2020 12:56    Procedures Procedures   Medications Ordered in ED Medications  lactated ringers bolus 1,000 mL (1,000 mLs Intravenous New Bag/Given 06/13/20 1506)    ED Course  I have reviewed the triage vital signs and the nursing  notes.  Pertinent labs & imaging results that were available during my care of the patient were reviewed by me and considered in my medical decision making (see chart for details).    MDM Rules/Calculators/A&P                          Tachycardic but afebrile on arrival, EKG with some T wave inversions new from previous. No chest pain. Weakness appears to be isolated to legs. Lumbar XR negative acute. LAbs including CBC, CMP, CK unremarkable.  Because of his bilateral leg weakness and difficulty walking, I have ordered an MRI of the lumbar spine for further evaluation.  Patient signed out to oncoming provider pending imaging results and reassessment. Final Clinical Impression(s) / ED Diagnoses Final diagnoses:  None    Rx / DC Orders ED Discharge Orders    None       Tatijana Bierly, Wenda Overland, MD 06/13/20 1544

## 2020-06-13 NOTE — ED Notes (Signed)
Pt returned from MRI °

## 2020-06-13 NOTE — Consult Note (Signed)
   Providing Compassionate, Quality Care - Together  Neurosurgery Consult  Referring physician: Dr. Charm Barges Reason for referral: Falls, lumbar stenosis  Chief Complaint: Falls  History of Present Illness: This is a 65 year old male with a history of CVA, MI, PE on aspirin, Eliquis, Plavix per the chart (patient is a poor historian and is unsure exactly what medications he is on) complains of recurrent falls over the past few weeks.  He states that he has chronic neuropathy in his feet and his balance is somewhat off.  He denies any numbness, tingling or weakness in his hands.  He has a history of an L5-S1 fusion many years ago in [facility], he does not remember the surgeon's name.  He had an MRI of the lumbar spine today due to his complaints that showed chronic changes with stenosis and herniated nucleus pulposus at L3-4, degenerative changes L4-5 with moderate stenosis as well.  Previous L5-S1 hardware is noted, with a medial right S1 screw.  At this time he denies any leg pain, complains of chronic numbness and tingling in his feet.  Denies any bowel or bladder changes, denies any numbness or saddle anesthesia in his groin.   Medications: I have reviewed the patient's current medications. Allergies: No Known Allergies  History reviewed. No pertinent family history. Social History:  has no history on file for tobacco use, alcohol use, and drug use.  ROS: Patient is a poor historian therefore cannot completely obtain review of systems  Physical Exam:  Vital signs in last 24 hours: Temp:  [98 F (36.7 C)-98.3 F (36.8 C)] 98 F (36.7 C) (07/25 1814) Pulse Rate:  [58-128] 65 (07/26 0746) Resp:  [11-18] 14 (07/26 0217) BP: (138-182)/(65-125) 153/88 (07/26 0700) SpO2:  [91 %-98 %] 96 % (07/26 0746) PE: Awake alert oriented x3. Disheveled, poor hygiene PERRLA Face symmetric EOMI Bilateral upper extremities 4+/5 throughout Bilateral lower extremities 4/5  throughout Deep tendon reflexes 2/4, no clonus bilaterally No Hoffmann's bilaterally Subjective decreased sensation to bilateral feet in a stocking fashion Multiple skin abrasions and varying aged scabs along his legs and feet   Impression/Assessment:  65 year old male with  1.  L3-4 degenerative disc disease with herniated nucleus pulposus, moderate stenosis 2.  L4-5 degenerative disc disease with lumbar spondylosis and moderate stenosis   Plan:  -Recommend PT/OT evaluation -Medical work-up -MRI reviewed, no acute neurosurgical intervention recommended.  These are chronic findings and do not explain his current complaints. -Pain control -Can follow-up as an outpatient for his lumbar stenosis/lumbar spondylosis   Thank you for allowing me to participate in this patient's care.  Please do not hesitate to call with questions or concerns.   Monia Pouch, DO Neurosurgeon [facility] Cell: [phone]

## 2020-06-13 NOTE — Care Management (Signed)
ED RNCM consulted by Dr. Melina Copa concerning patient needing rehab for recurrent falls.  ED RNCM met with patient who c/o of increase weakness and falls.  Patient states he has been temporarily living in a motel while his apartment is being repaired by the [place].  Patient will need to be evaluated by PT to determine if patient meets the criteria for SNF placement.

## 2020-06-13 NOTE — ED Notes (Signed)
MRI called to pre-screen pt. Pt will not require meds for scan.

## 2020-06-13 NOTE — ED Triage Notes (Signed)
Pt bib [facility] from hotel. Reporting increased weakness x 2 weeks in legs, with falls. Has not been treated. Hx of CHF, DM and MI. cbg 223 v/s stable.

## 2020-06-13 NOTE — ED Provider Notes (Signed)
Signout from Dr. Clarene Duke.  65 year old male here with weakness in his bilateral lower extremities x2 weeks.  Difficulty walking and falls.  He is pending MRI lumbar spine.  If no acute findings patient may need social work and outpatient resources. Physical Exam  BP 122/84   Pulse 99   Temp 99 F (37.2 C) (Oral)   Resp 17   Ht 5\' 10"  (1.778 m)   Wt 86.2 kg   SpO2 96%   BMI 27.26 kg/m   Physical Exam  ED Course/Procedures     Procedures  MDM  Reviewed MRI results with neurosurgery on-call Dr. .  He did not see any acute neurosurgical issue.  He said he was around and will evaluate the patient in person though and would let me know.  On my exam he has 5 out of 5 strength at hip extensors.  4 out of 5 strength with poor effort on plantar flexion and extension.  Multiple abrasions and contusions to his lower extremities.  Patient seen and examined by Dr. Jake Samples.  He does not see any surgical interventions.  Have consulted TOC.  She asked that we put in a PT consult.  Patient will board to the morning until evaluated by physical therapy and see if he will be a rehab candidate.  Patient is essentially homeless living in a motel so unclear if TOC is able to place.  Home medications ordered.       Jake Samples, MD 06/14/20 0900

## 2020-06-14 DIAGNOSIS — S8011XA Contusion of right lower leg, initial encounter: Secondary | ICD-10-CM | POA: Diagnosis not present

## 2020-06-14 NOTE — NC FL2 (Signed)
[place] MEDICAID FL2 LEVEL OF CARE SCREENING TOOL     IDENTIFICATION  Patient Name: [name] Birthdate: 1955-04-27 Sex: male Admission Date (Current Location): 06/13/2020  [facility] and [place] Number:  Herbalist and Address:  [facility]. [facility] [place] [address], [place], [place] [address]      Provider Number: [id number]  Attending Physician Name and Address:  Default, Provider, MD  Relative Name and Phone Number:  Rogers Blocker Mother   [phone]    Current Level of Care: Hospital Recommended Level of Care: [place] Prior Approval Number:    Date Approved/Denied:   PASRR Number: [id number]  Discharge Plan: SNF    Current Diagnoses: Patient Active Problem List   Diagnosis Date Noted  . Acute pulmonary embolism without acute cor pulmonale (HCC)   . Non-ST elevation (NSTEMI) myocardial infarction ([place])   . Coronary artery disease involving native coronary artery of native heart without angina pectoris   . Cardiac arrest ([place]) 03/02/2020  . Severe episode of recurrent major depressive disorder, without psychotic features ([place])   . Opioid use disorder, severe, in sustained remission ([place]) 05/25/2015  . Benzodiazepine abuse ([place]) 05/25/2015  . Cannabis use disorder, moderate, dependence ([place]) 05/25/2015  . MDD (major depressive disorder), recurrent episode, severe ([place]) 05/23/2015  . Chronic pain syndrome 05/23/2015  . GAD (generalized anxiety disorder) 05/23/2015  . CVA (cerebral infarction) 10/15/2013  . Hypertension 10/15/2013  . Diabetes mellitus ([place]) 10/15/2013    Orientation RESPIRATION BLADDER Height & Weight     Self,Time,Situation,Place  Normal Continent Weight: 190 lb (86.2 kg) Height:  $Remove'5\' 10"'iLBtAoH$  (177.8 cm)  BEHAVIORAL SYMPTOMS/MOOD NEUROLOGICAL BOWEL NUTRITION STATUS      Continent Diet (regular)  AMBULATORY STATUS COMMUNICATION OF NEEDS Skin   Extensive Assist Verbally Normal                        Personal Care Assistance Level of Assistance  Bathing,Feeding,Dressing Bathing Assistance: Limited assistance Feeding assistance: Independent Dressing Assistance: Limited assistance     Functional Limitations Info  Speech,Hearing,Sight Sight Info: Adequate (with glasses) Hearing Info: Adequate Speech Info: Adequate    SPECIAL CARE FACTORS FREQUENCY  PT (By licensed PT),OT (By licensed OT)     PT Frequency: 5x weekly OT Frequency: 5x weekly            Contractures Contractures Info: Not present    Additional Factors Info  Code Status Code Status Info: full             Current Medications (06/14/2020):  This is the current hospital active medication list Current Facility-Administered Medications  Medication Dose Route Frequency Provider Last Rate Last Admin  . acetaminophen (TYLENOL) tablet 650 mg  650 mg Oral Q6H PRN Hayden Rasmussen, MD      . apixaban Arne Cleveland) tablet 5 mg  5 mg Oral BID Hayden Rasmussen, MD   5 mg at 06/14/20 1028  . aspirin chewable tablet 81 mg  81 mg Oral Daily Hayden Rasmussen, MD   81 mg at 06/14/20 1029  . carvedilol (COREG) tablet 6.25 mg  6.25 mg Oral BID WC Hayden Rasmussen, MD   6.25 mg at 06/14/20 1036  . clopidogrel (PLAVIX) tablet 75 mg  75 mg Oral Daily Hayden Rasmussen, MD   75 mg at 06/14/20 1029  . famotidine (PEPCID) tablet 20 mg  20 mg Oral BID Hayden Rasmussen, MD   20 mg at  06/14/20 1029  . gabapentin (NEURONTIN) capsule 800 mg  800 mg Oral TID Hayden Rasmussen, MD   800 mg at 06/14/20 1029  . glipiZIDE (GLUCOTROL XL) 24 hr tablet 10 mg  10 mg Oral Daily Hayden Rasmussen, MD   10 mg at 06/14/20 1028  . metFORMIN (GLUCOPHAGE) tablet 850 mg  850 mg Oral BID WC Hayden Rasmussen, MD   850 mg at 06/14/20 1100  . rosuvastatin (CRESTOR) tablet 20 mg  20 mg Oral QHS Hayden Rasmussen, MD   20 mg at 06/14/20 0108  . sacubitril-valsartan (ENTRESTO) 24-26 mg per tablet  1 tablet Oral BID Hayden Rasmussen, MD   1 tablet at  06/14/20 1029  . thiamine tablet 50 mg  50 mg Oral Daily Hayden Rasmussen, MD   50 mg at 06/14/20 1029   Current Outpatient Medications  Medication Sig Dispense Refill  . Accu-Chek Softclix Lancets lancets USE AS DIRECTED 100 each 0  . acetaminophen (TYLENOL) 325 MG tablet TAKE 2 TABLETS (650 MG TOTAL) BY MOUTH EVERY SIX HOURS AS NEEDED FOR FEVER. (Patient taking differently: Take 650 mg by mouth every 6 (six) hours as needed for moderate pain or fever.) 30 tablet 0  . apixaban (ELIQUIS) 5 MG TABS tablet TAKE 2 TABLETS (10 MG TOTAL) BY MOUTH TWO TIMES DAILY FOR 7 DAYS, THEN 1 TABLET (5 MG TOTAL) TWO TIMES DAILY FOR 24 DAYS. (Patient taking differently: Take 5 mg by mouth 2 (two) times daily.) 74 tablet 0  . aspirin 81 MG chewable tablet CHEW 1 TABLET (81 MG TOTAL) BY MOUTH DAILY. (Patient taking differently: Chew 81 mg by mouth daily.) 30 tablet 0  . blood glucose meter kit and supplies KIT Dispense based on patient and insurance preference. Use up to four times daily as directed. (FOR ICD-9 250.00, 250.01). 1 each 0  . Blood Glucose Monitoring Suppl (ACCU-CHEK GUIDE) w/Device KIT USE AS DIRECTED 1 kit 0  . carvedilol (COREG) 6.25 MG tablet TAKE 1 TABLET (6.25 MG TOTAL) BY MOUTH TWO TIMES DAILY WITH A MEAL. (Patient taking differently: Take 6.25 mg by mouth 2 (two) times daily with a meal.) 60 tablet 0  . clopidogrel (PLAVIX) 75 MG tablet TAKE 1 TABLET (75 MG TOTAL) BY MOUTH DAILY. (Patient taking differently: Take 75 mg by mouth daily.) 30 tablet 0  . famotidine (PEPCID) 20 MG tablet TAKE 1 TABLET (20 MG TOTAL) BY MOUTH TWO TIMES DAILY. (Patient taking differently: Take 20 mg by mouth 2 (two) times daily.) 60 tablet 0  . gabapentin (NEURONTIN) 800 MG tablet Take 800 mg by mouth in the morning, at noon, in the evening, and at bedtime.     Marland Kitchen glipiZIDE (GLUCOTROL XL) 10 MG 24 hr tablet TAKE 1 TABLET (10 MG TOTAL) BY MOUTH DAILY. (Patient taking differently: Take 10 mg by mouth daily.) 30 tablet 0  .  glucose blood test strip USE AS DIRECTED FOUR TIMES DAILY (Patient taking differently: 4 (four) times daily. as directed) 100 strip 0  . metFORMIN (GLUCOPHAGE) 850 MG tablet TAKE 1 TABLET (850 MG TOTAL) BY MOUTH TWO TIMES DAILY. (Patient taking differently: Take 850 mg by mouth 2 (two) times daily with a meal.) 60 tablet 0  . rosuvastatin (CRESTOR) 20 MG tablet Take 20 mg by mouth at bedtime.    . sacubitril-valsartan (ENTRESTO) 24-26 MG TAKE 1 TABLET BY MOUTH TWO TIMES DAILY. 60 tablet 0  . thiamine 100 MG tablet TAKE 1 TABLET (100 MG TOTAL) BY MOUTH  DAILY. (Patient taking differently: Take 50 mg by mouth daily.) 30 tablet 0     Discharge Medications: Please see discharge summary for a list of discharge medications.  Relevant Imaging Results:  Relevant Lab Results:   Additional Information SSN# [id number] / Pt has received Covid vaccination  Eutimio Gharibian, LCSW

## 2020-06-14 NOTE — Progress Notes (Signed)
CSW faxed out referrals for SNF

## 2020-06-14 NOTE — Evaluation (Signed)
Physical Therapy Evaluation Patient Details Name: [name] MRN: [id number] DOB: [birth year] Today's Date: 06/14/2020   History of Present Illness  Pt is a 65 y/o male presenting to the ED on 4/20 secondary to weakness and falls. MRI of back revealed L3-4 disk herniation, however, per neurosurgery to be managed conservatively. PMH includes CVA, MI, PE, and CHF.  Clinical Impression  Pt admitted secondary to problem above with deficits below. Reporting back pain and increased weakness in BLE. BLE very shaky during transfers and side stepping at EOB. Requiring min A for support. Per pt, prior to becoming as weak as he is, he was independent and able to care for himself. Now reports multiple falls and has had difficulty performing ADLs. Feel he would benefit from ST SNF at d/c. Will continue to follow acutely.     Follow Up Recommendations SNF    Equipment Recommendations  Rolling walker with 5" wheels;3in1 (PT)    Recommendations for Other Services       Precautions / Restrictions Precautions Precautions: Fall Precaution Comments: REports 8 falls within the past 2 months Restrictions Weight Bearing Restrictions: No      Mobility  Bed Mobility Overal bed mobility: Needs Assistance Bed Mobility: Supine to Sit;Sit to Supine     Supine to sit: Mod assist Sit to supine: Min assist   General bed mobility comments: Mod A for trunk assist to come to sitting. Min A for LE assist to return to supine.    Transfers Overall transfer level: Needs assistance Equipment used: 1 person hand held assist Transfers: Sit to/from Stand Sit to Stand: Min assist         General transfer comment: Min A for lift assist and steadying to stand. Pt very shaky in standing.  Ambulation/Gait Ambulation/Gait assistance: Min assist   Assistive device: 1 person hand held assist   Gait velocity: Decreased   General Gait Details: Pt taking side steps at EOB with min A. Using stretcher on  BLE for support. BLE very shaky and unable to tolerate further mobility.  Stairs            Wheelchair Mobility    Modified Rankin (Stroke Patients Only)       Balance Overall balance assessment: Needs assistance Sitting-balance support: No upper extremity supported;Feet supported Sitting balance-Leahy Scale: Fair     Standing balance support: Single extremity supported Standing balance-Leahy Scale: Poor Standing balance comment: Reliant on UE and external support                             Pertinent Vitals/Pain Pain Assessment: 0-10 Pain Score: 4  Pain Location: lower back Pain Descriptors / Indicators: Aching Pain Intervention(s): Limited activity within patient's tolerance;Monitored during session;Repositioned    Home Living Family/patient expects to be discharged to:: Other (Comment) (motel) Living Arrangements: Alone Available Help at Discharge:  (reports no one) Type of Home: Other(Comment) (motel) Home Access: Level entry     Home Layout: One level Home Equipment: None      Prior Function           Comments: Reports he was very unsteady and not ambulating well. Hasn't had a shower in 2 weeks and has had difficulty with ADL tasks.     Hand Dominance        Extremity/Trunk Assessment   Upper Extremity Assessment Upper Extremity Assessment: Generalized weakness    Lower Extremity Assessment Lower Extremity Assessment: Generalized weakness  Cervical / Trunk Assessment Cervical / Trunk Assessment: Kyphotic  Communication   Communication: No difficulties  Cognition Arousal/Alertness: Awake/alert Behavior During Therapy: WFL for tasks assessed/performed Overall Cognitive Status: No family/caregiver present to determine baseline cognitive functioning                                        General Comments      Exercises     Assessment/Plan    PT Assessment Patient needs continued PT services  PT  Problem List Decreased strength;Decreased activity tolerance;Decreased balance;Decreased mobility;Decreased knowledge of use of DME       PT Treatment Interventions DME instruction;Gait training;Functional mobility training;Therapeutic activities;Therapeutic exercise;Balance training;Patient/family education    PT Goals (Current goals can be found in the Care Plan section)  Acute Rehab PT Goals Patient Stated Goal: to get stronger before going home PT Goal Formulation: With patient Time For Goal Achievement: 06/28/20 Potential to Achieve Goals: Fair    Frequency Min 2X/week   Barriers to discharge        Co-evaluation               AM-PAC PT "6 Clicks" Mobility  Outcome Measure Help needed turning from your back to your side while in a flat bed without using bedrails?: A Little Help needed moving from lying on your back to sitting on the side of a flat bed without using bedrails?: A Lot Help needed moving to and from a bed to a chair (including a wheelchair)?: A Little Help needed standing up from a chair using your arms (e.g., wheelchair or bedside chair)?: A Little Help needed to walk in hospital room?: A Lot Help needed climbing 3-5 steps with a railing? : Total 6 Click Score: 14    End of Session Equipment Utilized During Treatment: Gait belt Activity Tolerance: Patient tolerated treatment well Patient left: in bed;with call bell/phone within reach (on stretcher in ED) Nurse Communication: Mobility status PT Visit Diagnosis: Unsteadiness on feet (R26.81);Muscle weakness (generalized) (M62.81);History of falling (Z91.81)    Time: 1275-1700 PT Time Calculation (min) (ACUTE ONLY): 16 min   Charges:   PT Evaluation $PT Eval Moderate Complexity: 1 Mod          Farley Ly, PT, DPT  Acute Rehabilitation Services  Pager: [phone] Office: [phone]   Lehman Prom 06/14/2020, 11:46 AM

## 2020-06-14 NOTE — ED Notes (Signed)
Pt is eating at this time.

## 2020-06-15 DIAGNOSIS — F332 Major depressive disorder, recurrent severe without psychotic features: Secondary | ICD-10-CM | POA: Diagnosis not present

## 2020-06-15 DIAGNOSIS — M545 Low back pain, unspecified: Secondary | ICD-10-CM | POA: Diagnosis not present

## 2020-06-15 DIAGNOSIS — K219 Gastro-esophageal reflux disease without esophagitis: Secondary | ICD-10-CM | POA: Diagnosis not present

## 2020-06-15 DIAGNOSIS — E860 Dehydration: Secondary | ICD-10-CM | POA: Diagnosis not present

## 2020-06-15 DIAGNOSIS — G629 Polyneuropathy, unspecified: Secondary | ICD-10-CM | POA: Diagnosis not present

## 2020-06-15 DIAGNOSIS — M2550 Pain in unspecified joint: Secondary | ICD-10-CM | POA: Diagnosis not present

## 2020-06-15 DIAGNOSIS — Z955 Presence of coronary angioplasty implant and graft: Secondary | ICD-10-CM | POA: Diagnosis not present

## 2020-06-15 DIAGNOSIS — E785 Hyperlipidemia, unspecified: Secondary | ICD-10-CM | POA: Diagnosis not present

## 2020-06-15 DIAGNOSIS — U071 COVID-19: Secondary | ICD-10-CM | POA: Diagnosis not present

## 2020-06-15 DIAGNOSIS — Z7401 Bed confinement status: Secondary | ICD-10-CM | POA: Diagnosis not present

## 2020-06-15 DIAGNOSIS — R000 Tachycardia, unspecified: Secondary | ICD-10-CM | POA: Diagnosis not present

## 2020-06-15 DIAGNOSIS — I2510 Atherosclerotic heart disease of native coronary artery without angina pectoris: Secondary | ICD-10-CM | POA: Diagnosis not present

## 2020-06-15 DIAGNOSIS — Z8616 Personal history of COVID-19: Secondary | ICD-10-CM | POA: Diagnosis not present

## 2020-06-15 DIAGNOSIS — Z7901 Long term (current) use of anticoagulants: Secondary | ICD-10-CM | POA: Diagnosis not present

## 2020-06-15 DIAGNOSIS — I252 Old myocardial infarction: Secondary | ICD-10-CM | POA: Diagnosis not present

## 2020-06-15 DIAGNOSIS — G894 Chronic pain syndrome: Secondary | ICD-10-CM | POA: Diagnosis not present

## 2020-06-15 DIAGNOSIS — E119 Type 2 diabetes mellitus without complications: Secondary | ICD-10-CM | POA: Diagnosis not present

## 2020-06-15 DIAGNOSIS — R296 Repeated falls: Secondary | ICD-10-CM | POA: Diagnosis not present

## 2020-06-15 DIAGNOSIS — I25118 Atherosclerotic heart disease of native coronary artery with other forms of angina pectoris: Secondary | ICD-10-CM | POA: Diagnosis not present

## 2020-06-15 DIAGNOSIS — I2699 Other pulmonary embolism without acute cor pulmonale: Secondary | ICD-10-CM | POA: Diagnosis not present

## 2020-06-15 DIAGNOSIS — R531 Weakness: Secondary | ICD-10-CM | POA: Diagnosis not present

## 2020-06-15 DIAGNOSIS — Z20822 Contact with and (suspected) exposure to covid-19: Secondary | ICD-10-CM | POA: Diagnosis not present

## 2020-06-15 DIAGNOSIS — M5136 Other intervertebral disc degeneration, lumbar region: Secondary | ICD-10-CM | POA: Diagnosis not present

## 2020-06-15 DIAGNOSIS — E1140 Type 2 diabetes mellitus with diabetic neuropathy, unspecified: Secondary | ICD-10-CM | POA: Diagnosis not present

## 2020-06-15 DIAGNOSIS — S8011XA Contusion of right lower leg, initial encounter: Secondary | ICD-10-CM | POA: Diagnosis not present

## 2020-06-15 DIAGNOSIS — I10 Essential (primary) hypertension: Secondary | ICD-10-CM | POA: Diagnosis not present

## 2020-06-15 DIAGNOSIS — F411 Generalized anxiety disorder: Secondary | ICD-10-CM | POA: Diagnosis not present

## 2020-06-15 DIAGNOSIS — Z8673 Personal history of transient ischemic attack (TIA), and cerebral infarction without residual deficits: Secondary | ICD-10-CM | POA: Diagnosis not present

## 2020-06-15 DIAGNOSIS — S80812A Abrasion, left lower leg, initial encounter: Secondary | ICD-10-CM | POA: Diagnosis not present

## 2020-06-15 DIAGNOSIS — M6281 Muscle weakness (generalized): Secondary | ICD-10-CM | POA: Diagnosis not present

## 2020-06-15 DIAGNOSIS — Z86711 Personal history of pulmonary embolism: Secondary | ICD-10-CM | POA: Diagnosis not present

## 2020-06-15 LAB — POC SARS CORONAVIRUS 2 AG -  ED: SARSCOV2ONAVIRUS 2 AG: NEGATIVE

## 2020-06-15 NOTE — Progress Notes (Signed)
TOC CSW spoke with Kathy/[facility].  Olegario Messier asked about pts dc plan due to his apartment being repaired.  [facility] is unable to offer pt a LTC bed, due to no availability.    CSW will continue to follow for dc needs.  Ragen Laver Tarpley-Carter, MSW, LCSW-A Pronouns:  She, Her, Hers                  [facility] ED Transitions of CareClinical Social Worker [email or web address] [phone]

## 2020-06-15 NOTE — Discharge Instructions (Signed)
Follow up with primary care doctor if you continue to have unsteady gait and generalized weakness.  There were no indications for hospital admission based on your symptoms and work-up.

## 2020-06-15 NOTE — Progress Notes (Signed)
TOC CSW spoke with Kathy/[facility] in regards to pts bed offer.  Pt has agreed to bed offer.  Olegario Messier asked that the pt be boosted.  CSW will continue to follow for dc needs.  Marvin Grabill Tarpley-Carter, MSW, LCSW-A Pronouns:  She, Her, Hers                  [facility] Transitions of CareClinical Social Worker [email or web address] [phone]

## 2020-06-15 NOTE — Progress Notes (Signed)
CSW received message from [place] at [facility] that Pt will be going to room #128. # for report [phone]  RN updated.

## 2020-06-15 NOTE — ED Notes (Signed)
Report called to [phone] receiving RN. Patient ETA 1 hour from [facility] transport. AVS to be sent with patient. Labs and vitals reported to facility.

## 2020-06-15 NOTE — ED Notes (Signed)
Breakfast Ordered 

## 2020-06-18 DIAGNOSIS — R296 Repeated falls: Secondary | ICD-10-CM | POA: Diagnosis not present

## 2020-06-18 DIAGNOSIS — M6281 Muscle weakness (generalized): Secondary | ICD-10-CM | POA: Diagnosis not present

## 2020-06-18 DIAGNOSIS — E860 Dehydration: Secondary | ICD-10-CM | POA: Diagnosis not present

## 2020-06-18 DIAGNOSIS — Z86711 Personal history of pulmonary embolism: Secondary | ICD-10-CM | POA: Diagnosis not present

## 2020-06-18 DIAGNOSIS — E1140 Type 2 diabetes mellitus with diabetic neuropathy, unspecified: Secondary | ICD-10-CM | POA: Diagnosis not present

## 2020-06-18 DIAGNOSIS — Z7901 Long term (current) use of anticoagulants: Secondary | ICD-10-CM | POA: Diagnosis not present

## 2020-06-20 DIAGNOSIS — R296 Repeated falls: Secondary | ICD-10-CM | POA: Diagnosis not present

## 2020-06-20 DIAGNOSIS — M545 Low back pain, unspecified: Secondary | ICD-10-CM | POA: Diagnosis not present

## 2020-06-20 DIAGNOSIS — M6281 Muscle weakness (generalized): Secondary | ICD-10-CM | POA: Diagnosis not present

## 2020-06-25 DIAGNOSIS — M545 Low back pain, unspecified: Secondary | ICD-10-CM | POA: Diagnosis not present

## 2020-06-25 DIAGNOSIS — R296 Repeated falls: Secondary | ICD-10-CM | POA: Diagnosis not present

## 2020-06-25 DIAGNOSIS — M6281 Muscle weakness (generalized): Secondary | ICD-10-CM | POA: Diagnosis not present

## 2020-06-27 ENCOUNTER — Other Ambulatory Visit: Payer: Self-pay

## 2020-06-27 ENCOUNTER — Other Ambulatory Visit: Payer: Self-pay | Admitting: *Deleted

## 2020-06-27 NOTE — Patient Outreach (Signed)
Member screened for potential [facility] Care Management needs. Mr. Beauregard resides in [facility].   [facility] SN[facility] SW reports Mr. Tabbert initially declined ALF post rehab. However, he has changed his mind and is now agreeable to ALF placement post SNF.   Will continue to follow for potential [facility] Care Management services should transition plans change.    Raiford Noble, MSN, RN,BSN [facility] Post Acute Care Coordinator [phone] [facility]) [phone]  (Toll free office)

## 2020-07-12 ENCOUNTER — Other Ambulatory Visit: Payer: Self-pay

## 2020-07-31 DIAGNOSIS — M545 Low back pain, unspecified: Secondary | ICD-10-CM | POA: Diagnosis not present

## 2020-07-31 DIAGNOSIS — M6281 Muscle weakness (generalized): Secondary | ICD-10-CM | POA: Diagnosis not present

## 2020-07-31 DIAGNOSIS — U071 COVID-19: Secondary | ICD-10-CM | POA: Diagnosis not present

## 2020-08-03 DIAGNOSIS — U071 COVID-19: Secondary | ICD-10-CM | POA: Diagnosis not present

## 2020-08-03 DIAGNOSIS — I25118 Atherosclerotic heart disease of native coronary artery with other forms of angina pectoris: Secondary | ICD-10-CM | POA: Diagnosis not present

## 2020-08-03 DIAGNOSIS — Z7901 Long term (current) use of anticoagulants: Secondary | ICD-10-CM | POA: Diagnosis not present

## 2020-08-03 DIAGNOSIS — Z955 Presence of coronary angioplasty implant and graft: Secondary | ICD-10-CM | POA: Diagnosis not present

## 2020-08-16 DIAGNOSIS — K219 Gastro-esophageal reflux disease without esophagitis: Secondary | ICD-10-CM | POA: Diagnosis not present

## 2020-08-16 DIAGNOSIS — E785 Hyperlipidemia, unspecified: Secondary | ICD-10-CM | POA: Diagnosis not present

## 2020-08-16 DIAGNOSIS — I252 Old myocardial infarction: Secondary | ICD-10-CM | POA: Diagnosis not present

## 2020-08-16 DIAGNOSIS — F332 Major depressive disorder, recurrent severe without psychotic features: Secondary | ICD-10-CM | POA: Diagnosis not present

## 2020-08-16 DIAGNOSIS — Z7901 Long term (current) use of anticoagulants: Secondary | ICD-10-CM | POA: Diagnosis not present

## 2020-08-16 DIAGNOSIS — I509 Heart failure, unspecified: Secondary | ICD-10-CM | POA: Diagnosis not present

## 2020-08-16 DIAGNOSIS — G894 Chronic pain syndrome: Secondary | ICD-10-CM | POA: Diagnosis not present

## 2020-08-16 DIAGNOSIS — Z86711 Personal history of pulmonary embolism: Secondary | ICD-10-CM | POA: Diagnosis not present

## 2020-08-16 DIAGNOSIS — I10 Essential (primary) hypertension: Secondary | ICD-10-CM | POA: Diagnosis not present

## 2020-08-16 DIAGNOSIS — E119 Type 2 diabetes mellitus without complications: Secondary | ICD-10-CM | POA: Diagnosis not present

## 2020-08-16 DIAGNOSIS — F411 Generalized anxiety disorder: Secondary | ICD-10-CM | POA: Diagnosis not present

## 2020-08-16 DIAGNOSIS — F17210 Nicotine dependence, cigarettes, uncomplicated: Secondary | ICD-10-CM | POA: Diagnosis not present

## 2020-08-16 DIAGNOSIS — Z7984 Long term (current) use of oral hypoglycemic drugs: Secondary | ICD-10-CM | POA: Diagnosis not present

## 2020-08-16 DIAGNOSIS — I2510 Atherosclerotic heart disease of native coronary artery without angina pectoris: Secondary | ICD-10-CM | POA: Diagnosis not present

## 2020-08-16 DIAGNOSIS — F419 Anxiety disorder, unspecified: Secondary | ICD-10-CM | POA: Diagnosis not present

## 2020-08-16 DIAGNOSIS — Z8673 Personal history of transient ischemic attack (TIA), and cerebral infarction without residual deficits: Secondary | ICD-10-CM | POA: Diagnosis not present

## 2020-08-16 DIAGNOSIS — G459 Transient cerebral ischemic attack, unspecified: Secondary | ICD-10-CM | POA: Diagnosis not present

## 2020-08-20 DIAGNOSIS — F332 Major depressive disorder, recurrent severe without psychotic features: Secondary | ICD-10-CM | POA: Diagnosis not present

## 2020-08-20 DIAGNOSIS — G894 Chronic pain syndrome: Secondary | ICD-10-CM | POA: Diagnosis not present

## 2020-08-20 DIAGNOSIS — I10 Essential (primary) hypertension: Secondary | ICD-10-CM | POA: Diagnosis not present

## 2020-08-20 DIAGNOSIS — F411 Generalized anxiety disorder: Secondary | ICD-10-CM | POA: Diagnosis not present

## 2020-08-20 DIAGNOSIS — E119 Type 2 diabetes mellitus without complications: Secondary | ICD-10-CM | POA: Diagnosis not present

## 2020-08-20 DIAGNOSIS — I2510 Atherosclerotic heart disease of native coronary artery without angina pectoris: Secondary | ICD-10-CM | POA: Diagnosis not present

## 2020-08-23 DIAGNOSIS — E119 Type 2 diabetes mellitus without complications: Secondary | ICD-10-CM | POA: Diagnosis not present

## 2020-08-23 DIAGNOSIS — I252 Old myocardial infarction: Secondary | ICD-10-CM | POA: Diagnosis not present

## 2020-08-23 DIAGNOSIS — F411 Generalized anxiety disorder: Secondary | ICD-10-CM | POA: Diagnosis not present

## 2020-08-23 DIAGNOSIS — F332 Major depressive disorder, recurrent severe without psychotic features: Secondary | ICD-10-CM | POA: Diagnosis not present

## 2020-08-23 DIAGNOSIS — I10 Essential (primary) hypertension: Secondary | ICD-10-CM | POA: Diagnosis not present

## 2020-08-23 DIAGNOSIS — G459 Transient cerebral ischemic attack, unspecified: Secondary | ICD-10-CM | POA: Diagnosis not present

## 2020-08-23 DIAGNOSIS — G894 Chronic pain syndrome: Secondary | ICD-10-CM | POA: Diagnosis not present

## 2020-08-23 DIAGNOSIS — I509 Heart failure, unspecified: Secondary | ICD-10-CM | POA: Diagnosis not present

## 2020-08-23 DIAGNOSIS — I2510 Atherosclerotic heart disease of native coronary artery without angina pectoris: Secondary | ICD-10-CM | POA: Diagnosis not present

## 2020-08-23 DIAGNOSIS — E785 Hyperlipidemia, unspecified: Secondary | ICD-10-CM | POA: Diagnosis not present

## 2020-08-29 DIAGNOSIS — I2510 Atherosclerotic heart disease of native coronary artery without angina pectoris: Secondary | ICD-10-CM | POA: Diagnosis not present

## 2020-08-29 DIAGNOSIS — I10 Essential (primary) hypertension: Secondary | ICD-10-CM | POA: Diagnosis not present

## 2020-08-29 DIAGNOSIS — E119 Type 2 diabetes mellitus without complications: Secondary | ICD-10-CM | POA: Diagnosis not present

## 2020-08-29 DIAGNOSIS — F332 Major depressive disorder, recurrent severe without psychotic features: Secondary | ICD-10-CM | POA: Diagnosis not present

## 2020-08-29 DIAGNOSIS — F411 Generalized anxiety disorder: Secondary | ICD-10-CM | POA: Diagnosis not present

## 2020-08-29 DIAGNOSIS — G894 Chronic pain syndrome: Secondary | ICD-10-CM | POA: Diagnosis not present

## 2020-08-31 DIAGNOSIS — G894 Chronic pain syndrome: Secondary | ICD-10-CM | POA: Diagnosis not present

## 2020-08-31 DIAGNOSIS — F411 Generalized anxiety disorder: Secondary | ICD-10-CM | POA: Diagnosis not present

## 2020-08-31 DIAGNOSIS — I10 Essential (primary) hypertension: Secondary | ICD-10-CM | POA: Diagnosis not present

## 2020-08-31 DIAGNOSIS — E119 Type 2 diabetes mellitus without complications: Secondary | ICD-10-CM | POA: Diagnosis not present

## 2020-08-31 DIAGNOSIS — F332 Major depressive disorder, recurrent severe without psychotic features: Secondary | ICD-10-CM | POA: Diagnosis not present

## 2020-08-31 DIAGNOSIS — I2510 Atherosclerotic heart disease of native coronary artery without angina pectoris: Secondary | ICD-10-CM | POA: Diagnosis not present

## 2020-09-03 DIAGNOSIS — F332 Major depressive disorder, recurrent severe without psychotic features: Secondary | ICD-10-CM | POA: Diagnosis not present

## 2020-09-03 DIAGNOSIS — E119 Type 2 diabetes mellitus without complications: Secondary | ICD-10-CM | POA: Diagnosis not present

## 2020-09-03 DIAGNOSIS — I10 Essential (primary) hypertension: Secondary | ICD-10-CM | POA: Diagnosis not present

## 2020-09-03 DIAGNOSIS — I2510 Atherosclerotic heart disease of native coronary artery without angina pectoris: Secondary | ICD-10-CM | POA: Diagnosis not present

## 2020-09-03 DIAGNOSIS — F411 Generalized anxiety disorder: Secondary | ICD-10-CM | POA: Diagnosis not present

## 2020-09-03 DIAGNOSIS — G894 Chronic pain syndrome: Secondary | ICD-10-CM | POA: Diagnosis not present

## 2020-09-05 DIAGNOSIS — F411 Generalized anxiety disorder: Secondary | ICD-10-CM | POA: Diagnosis not present

## 2020-09-05 DIAGNOSIS — G894 Chronic pain syndrome: Secondary | ICD-10-CM | POA: Diagnosis not present

## 2020-09-05 DIAGNOSIS — I10 Essential (primary) hypertension: Secondary | ICD-10-CM | POA: Diagnosis not present

## 2020-09-05 DIAGNOSIS — F332 Major depressive disorder, recurrent severe without psychotic features: Secondary | ICD-10-CM | POA: Diagnosis not present

## 2020-09-05 DIAGNOSIS — E119 Type 2 diabetes mellitus without complications: Secondary | ICD-10-CM | POA: Diagnosis not present

## 2020-09-05 DIAGNOSIS — I2510 Atherosclerotic heart disease of native coronary artery without angina pectoris: Secondary | ICD-10-CM | POA: Diagnosis not present

## 2020-09-06 DIAGNOSIS — B351 Tinea unguium: Secondary | ICD-10-CM | POA: Diagnosis not present

## 2020-09-06 DIAGNOSIS — M79674 Pain in right toe(s): Secondary | ICD-10-CM | POA: Diagnosis not present

## 2020-09-06 DIAGNOSIS — M79675 Pain in left toe(s): Secondary | ICD-10-CM | POA: Diagnosis not present

## 2020-09-07 DIAGNOSIS — E119 Type 2 diabetes mellitus without complications: Secondary | ICD-10-CM | POA: Diagnosis not present

## 2020-09-07 DIAGNOSIS — F332 Major depressive disorder, recurrent severe without psychotic features: Secondary | ICD-10-CM | POA: Diagnosis not present

## 2020-09-07 DIAGNOSIS — I2510 Atherosclerotic heart disease of native coronary artery without angina pectoris: Secondary | ICD-10-CM | POA: Diagnosis not present

## 2020-09-07 DIAGNOSIS — G894 Chronic pain syndrome: Secondary | ICD-10-CM | POA: Diagnosis not present

## 2020-09-07 DIAGNOSIS — I10 Essential (primary) hypertension: Secondary | ICD-10-CM | POA: Diagnosis not present

## 2020-09-07 DIAGNOSIS — F411 Generalized anxiety disorder: Secondary | ICD-10-CM | POA: Diagnosis not present

## 2020-09-10 DIAGNOSIS — I2510 Atherosclerotic heart disease of native coronary artery without angina pectoris: Secondary | ICD-10-CM | POA: Diagnosis not present

## 2020-09-10 DIAGNOSIS — I10 Essential (primary) hypertension: Secondary | ICD-10-CM | POA: Diagnosis not present

## 2020-09-10 DIAGNOSIS — E119 Type 2 diabetes mellitus without complications: Secondary | ICD-10-CM | POA: Diagnosis not present

## 2020-09-10 DIAGNOSIS — I252 Old myocardial infarction: Secondary | ICD-10-CM | POA: Diagnosis not present

## 2020-09-10 DIAGNOSIS — E785 Hyperlipidemia, unspecified: Secondary | ICD-10-CM | POA: Diagnosis not present

## 2020-09-10 DIAGNOSIS — G459 Transient cerebral ischemic attack, unspecified: Secondary | ICD-10-CM | POA: Diagnosis not present

## 2020-09-10 DIAGNOSIS — I509 Heart failure, unspecified: Secondary | ICD-10-CM | POA: Diagnosis not present

## 2020-09-12 DIAGNOSIS — E119 Type 2 diabetes mellitus without complications: Secondary | ICD-10-CM | POA: Diagnosis not present

## 2020-09-12 DIAGNOSIS — I2510 Atherosclerotic heart disease of native coronary artery without angina pectoris: Secondary | ICD-10-CM | POA: Diagnosis not present

## 2020-09-12 DIAGNOSIS — I10 Essential (primary) hypertension: Secondary | ICD-10-CM | POA: Diagnosis not present

## 2020-09-12 DIAGNOSIS — G894 Chronic pain syndrome: Secondary | ICD-10-CM | POA: Diagnosis not present

## 2020-09-12 DIAGNOSIS — F332 Major depressive disorder, recurrent severe without psychotic features: Secondary | ICD-10-CM | POA: Diagnosis not present

## 2020-09-12 DIAGNOSIS — F411 Generalized anxiety disorder: Secondary | ICD-10-CM | POA: Diagnosis not present

## 2020-09-13 DIAGNOSIS — F332 Major depressive disorder, recurrent severe without psychotic features: Secondary | ICD-10-CM | POA: Diagnosis not present

## 2020-09-13 DIAGNOSIS — G894 Chronic pain syndrome: Secondary | ICD-10-CM | POA: Diagnosis not present

## 2020-09-13 DIAGNOSIS — I2510 Atherosclerotic heart disease of native coronary artery without angina pectoris: Secondary | ICD-10-CM | POA: Diagnosis not present

## 2020-09-13 DIAGNOSIS — Z8673 Personal history of transient ischemic attack (TIA), and cerebral infarction without residual deficits: Secondary | ICD-10-CM | POA: Diagnosis not present

## 2020-09-13 DIAGNOSIS — E119 Type 2 diabetes mellitus without complications: Secondary | ICD-10-CM | POA: Diagnosis not present

## 2020-09-13 DIAGNOSIS — F17210 Nicotine dependence, cigarettes, uncomplicated: Secondary | ICD-10-CM | POA: Diagnosis not present

## 2020-09-13 DIAGNOSIS — I10 Essential (primary) hypertension: Secondary | ICD-10-CM | POA: Diagnosis not present

## 2020-09-13 DIAGNOSIS — F411 Generalized anxiety disorder: Secondary | ICD-10-CM | POA: Diagnosis not present

## 2020-09-15 DIAGNOSIS — Z7984 Long term (current) use of oral hypoglycemic drugs: Secondary | ICD-10-CM | POA: Diagnosis not present

## 2020-09-15 DIAGNOSIS — F411 Generalized anxiety disorder: Secondary | ICD-10-CM | POA: Diagnosis not present

## 2020-09-15 DIAGNOSIS — I10 Essential (primary) hypertension: Secondary | ICD-10-CM | POA: Diagnosis not present

## 2020-09-15 DIAGNOSIS — Z8673 Personal history of transient ischemic attack (TIA), and cerebral infarction without residual deficits: Secondary | ICD-10-CM | POA: Diagnosis not present

## 2020-09-15 DIAGNOSIS — F332 Major depressive disorder, recurrent severe without psychotic features: Secondary | ICD-10-CM | POA: Diagnosis not present

## 2020-09-15 DIAGNOSIS — Z86711 Personal history of pulmonary embolism: Secondary | ICD-10-CM | POA: Diagnosis not present

## 2020-09-15 DIAGNOSIS — G894 Chronic pain syndrome: Secondary | ICD-10-CM | POA: Diagnosis not present

## 2020-09-15 DIAGNOSIS — F17210 Nicotine dependence, cigarettes, uncomplicated: Secondary | ICD-10-CM | POA: Diagnosis not present

## 2020-09-15 DIAGNOSIS — E119 Type 2 diabetes mellitus without complications: Secondary | ICD-10-CM | POA: Diagnosis not present

## 2020-09-15 DIAGNOSIS — Z7901 Long term (current) use of anticoagulants: Secondary | ICD-10-CM | POA: Diagnosis not present

## 2020-09-15 DIAGNOSIS — I2510 Atherosclerotic heart disease of native coronary artery without angina pectoris: Secondary | ICD-10-CM | POA: Diagnosis not present

## 2020-09-19 DIAGNOSIS — I2510 Atherosclerotic heart disease of native coronary artery without angina pectoris: Secondary | ICD-10-CM | POA: Diagnosis not present

## 2020-09-19 DIAGNOSIS — E119 Type 2 diabetes mellitus without complications: Secondary | ICD-10-CM | POA: Diagnosis not present

## 2020-09-19 DIAGNOSIS — F411 Generalized anxiety disorder: Secondary | ICD-10-CM | POA: Diagnosis not present

## 2020-09-19 DIAGNOSIS — F332 Major depressive disorder, recurrent severe without psychotic features: Secondary | ICD-10-CM | POA: Diagnosis not present

## 2020-09-19 DIAGNOSIS — I10 Essential (primary) hypertension: Secondary | ICD-10-CM | POA: Diagnosis not present

## 2020-09-19 DIAGNOSIS — G894 Chronic pain syndrome: Secondary | ICD-10-CM | POA: Diagnosis not present

## 2020-09-20 DIAGNOSIS — F329 Major depressive disorder, single episode, unspecified: Secondary | ICD-10-CM | POA: Diagnosis not present

## 2020-09-20 DIAGNOSIS — I739 Peripheral vascular disease, unspecified: Secondary | ICD-10-CM | POA: Diagnosis not present

## 2020-09-20 DIAGNOSIS — K219 Gastro-esophageal reflux disease without esophagitis: Secondary | ICD-10-CM | POA: Diagnosis not present

## 2020-09-20 DIAGNOSIS — E119 Type 2 diabetes mellitus without complications: Secondary | ICD-10-CM | POA: Diagnosis not present

## 2020-09-20 DIAGNOSIS — I509 Heart failure, unspecified: Secondary | ICD-10-CM | POA: Diagnosis not present

## 2020-09-20 DIAGNOSIS — G629 Polyneuropathy, unspecified: Secondary | ICD-10-CM | POA: Diagnosis not present

## 2020-09-20 DIAGNOSIS — I2510 Atherosclerotic heart disease of native coronary artery without angina pectoris: Secondary | ICD-10-CM | POA: Diagnosis not present

## 2020-09-20 DIAGNOSIS — E785 Hyperlipidemia, unspecified: Secondary | ICD-10-CM | POA: Diagnosis not present

## 2020-09-20 DIAGNOSIS — F419 Anxiety disorder, unspecified: Secondary | ICD-10-CM | POA: Diagnosis not present

## 2020-09-20 DIAGNOSIS — I10 Essential (primary) hypertension: Secondary | ICD-10-CM | POA: Diagnosis not present

## 2020-09-24 DIAGNOSIS — E119 Type 2 diabetes mellitus without complications: Secondary | ICD-10-CM | POA: Diagnosis not present

## 2020-09-24 DIAGNOSIS — I2510 Atherosclerotic heart disease of native coronary artery without angina pectoris: Secondary | ICD-10-CM | POA: Diagnosis not present

## 2020-09-24 DIAGNOSIS — F332 Major depressive disorder, recurrent severe without psychotic features: Secondary | ICD-10-CM | POA: Diagnosis not present

## 2020-09-24 DIAGNOSIS — F411 Generalized anxiety disorder: Secondary | ICD-10-CM | POA: Diagnosis not present

## 2020-09-24 DIAGNOSIS — G894 Chronic pain syndrome: Secondary | ICD-10-CM | POA: Diagnosis not present

## 2020-09-24 DIAGNOSIS — I10 Essential (primary) hypertension: Secondary | ICD-10-CM | POA: Diagnosis not present

## 2020-09-26 DIAGNOSIS — I10 Essential (primary) hypertension: Secondary | ICD-10-CM | POA: Diagnosis not present

## 2020-10-01 DIAGNOSIS — F332 Major depressive disorder, recurrent severe without psychotic features: Secondary | ICD-10-CM | POA: Diagnosis not present

## 2020-10-01 DIAGNOSIS — I2510 Atherosclerotic heart disease of native coronary artery without angina pectoris: Secondary | ICD-10-CM | POA: Diagnosis not present

## 2020-10-01 DIAGNOSIS — G894 Chronic pain syndrome: Secondary | ICD-10-CM | POA: Diagnosis not present

## 2020-10-01 DIAGNOSIS — F411 Generalized anxiety disorder: Secondary | ICD-10-CM | POA: Diagnosis not present

## 2020-10-01 DIAGNOSIS — E119 Type 2 diabetes mellitus without complications: Secondary | ICD-10-CM | POA: Diagnosis not present

## 2020-10-01 DIAGNOSIS — I10 Essential (primary) hypertension: Secondary | ICD-10-CM | POA: Diagnosis not present

## 2020-10-05 DIAGNOSIS — F329 Major depressive disorder, single episode, unspecified: Secondary | ICD-10-CM | POA: Diagnosis not present

## 2020-10-05 DIAGNOSIS — I2510 Atherosclerotic heart disease of native coronary artery without angina pectoris: Secondary | ICD-10-CM | POA: Diagnosis not present

## 2020-10-05 DIAGNOSIS — E038 Other specified hypothyroidism: Secondary | ICD-10-CM | POA: Diagnosis not present

## 2020-10-05 DIAGNOSIS — G9009 Other idiopathic peripheral autonomic neuropathy: Secondary | ICD-10-CM | POA: Diagnosis not present

## 2020-10-05 DIAGNOSIS — I10 Essential (primary) hypertension: Secondary | ICD-10-CM | POA: Diagnosis not present

## 2020-10-05 DIAGNOSIS — E7849 Other hyperlipidemia: Secondary | ICD-10-CM | POA: Diagnosis not present

## 2020-10-05 DIAGNOSIS — E785 Hyperlipidemia, unspecified: Secondary | ICD-10-CM | POA: Diagnosis not present

## 2020-10-05 DIAGNOSIS — D518 Other vitamin B12 deficiency anemias: Secondary | ICD-10-CM | POA: Diagnosis not present

## 2020-10-05 DIAGNOSIS — Z79899 Other long term (current) drug therapy: Secondary | ICD-10-CM | POA: Diagnosis not present

## 2020-10-05 DIAGNOSIS — E559 Vitamin D deficiency, unspecified: Secondary | ICD-10-CM | POA: Diagnosis not present

## 2020-10-05 DIAGNOSIS — M48061 Spinal stenosis, lumbar region without neurogenic claudication: Secondary | ICD-10-CM | POA: Diagnosis not present

## 2020-10-05 DIAGNOSIS — J449 Chronic obstructive pulmonary disease, unspecified: Secondary | ICD-10-CM | POA: Diagnosis not present

## 2020-10-05 DIAGNOSIS — E119 Type 2 diabetes mellitus without complications: Secondary | ICD-10-CM | POA: Diagnosis not present

## 2020-10-06 DIAGNOSIS — I509 Heart failure, unspecified: Secondary | ICD-10-CM | POA: Diagnosis not present

## 2020-10-06 DIAGNOSIS — E038 Other specified hypothyroidism: Secondary | ICD-10-CM | POA: Diagnosis not present

## 2020-10-06 DIAGNOSIS — E785 Hyperlipidemia, unspecified: Secondary | ICD-10-CM | POA: Diagnosis not present

## 2020-10-06 DIAGNOSIS — I252 Old myocardial infarction: Secondary | ICD-10-CM | POA: Diagnosis not present

## 2020-10-06 DIAGNOSIS — G459 Transient cerebral ischemic attack, unspecified: Secondary | ICD-10-CM | POA: Diagnosis not present

## 2020-10-06 DIAGNOSIS — I2510 Atherosclerotic heart disease of native coronary artery without angina pectoris: Secondary | ICD-10-CM | POA: Diagnosis not present

## 2020-10-06 DIAGNOSIS — F329 Major depressive disorder, single episode, unspecified: Secondary | ICD-10-CM | POA: Diagnosis not present

## 2020-10-06 DIAGNOSIS — I10 Essential (primary) hypertension: Secondary | ICD-10-CM | POA: Diagnosis not present

## 2020-10-06 DIAGNOSIS — E559 Vitamin D deficiency, unspecified: Secondary | ICD-10-CM | POA: Diagnosis not present

## 2020-10-06 DIAGNOSIS — E119 Type 2 diabetes mellitus without complications: Secondary | ICD-10-CM | POA: Diagnosis not present

## 2020-10-18 DIAGNOSIS — I252 Old myocardial infarction: Secondary | ICD-10-CM | POA: Diagnosis not present

## 2020-10-18 DIAGNOSIS — I10 Essential (primary) hypertension: Secondary | ICD-10-CM | POA: Diagnosis not present

## 2020-10-18 DIAGNOSIS — R911 Solitary pulmonary nodule: Secondary | ICD-10-CM | POA: Diagnosis not present

## 2020-10-18 DIAGNOSIS — E785 Hyperlipidemia, unspecified: Secondary | ICD-10-CM | POA: Diagnosis not present

## 2020-10-18 DIAGNOSIS — G4700 Insomnia, unspecified: Secondary | ICD-10-CM | POA: Diagnosis not present

## 2020-10-18 DIAGNOSIS — K219 Gastro-esophageal reflux disease without esophagitis: Secondary | ICD-10-CM | POA: Diagnosis not present

## 2020-10-18 DIAGNOSIS — E119 Type 2 diabetes mellitus without complications: Secondary | ICD-10-CM | POA: Diagnosis not present

## 2020-10-21 DIAGNOSIS — I10 Essential (primary) hypertension: Secondary | ICD-10-CM | POA: Diagnosis not present

## 2020-10-21 DIAGNOSIS — I252 Old myocardial infarction: Secondary | ICD-10-CM | POA: Diagnosis not present

## 2020-10-21 DIAGNOSIS — F332 Major depressive disorder, recurrent severe without psychotic features: Secondary | ICD-10-CM | POA: Diagnosis not present

## 2020-10-21 DIAGNOSIS — E119 Type 2 diabetes mellitus without complications: Secondary | ICD-10-CM | POA: Diagnosis not present

## 2020-10-23 DIAGNOSIS — F419 Anxiety disorder, unspecified: Secondary | ICD-10-CM | POA: Diagnosis not present

## 2020-10-23 DIAGNOSIS — F32A Depression, unspecified: Secondary | ICD-10-CM | POA: Diagnosis not present

## 2020-10-24 DIAGNOSIS — Z79899 Other long term (current) drug therapy: Secondary | ICD-10-CM | POA: Diagnosis not present

## 2020-10-25 DIAGNOSIS — I10 Essential (primary) hypertension: Secondary | ICD-10-CM | POA: Diagnosis not present

## 2020-10-25 DIAGNOSIS — R278 Other lack of coordination: Secondary | ICD-10-CM | POA: Diagnosis not present

## 2020-10-26 DIAGNOSIS — F332 Major depressive disorder, recurrent severe without psychotic features: Secondary | ICD-10-CM | POA: Diagnosis not present

## 2020-10-26 DIAGNOSIS — E119 Type 2 diabetes mellitus without complications: Secondary | ICD-10-CM | POA: Diagnosis not present

## 2020-10-26 DIAGNOSIS — I10 Essential (primary) hypertension: Secondary | ICD-10-CM | POA: Diagnosis not present

## 2020-10-26 DIAGNOSIS — I252 Old myocardial infarction: Secondary | ICD-10-CM | POA: Diagnosis not present

## 2020-10-29 DIAGNOSIS — E119 Type 2 diabetes mellitus without complications: Secondary | ICD-10-CM | POA: Diagnosis not present

## 2020-10-29 DIAGNOSIS — I252 Old myocardial infarction: Secondary | ICD-10-CM | POA: Diagnosis not present

## 2020-10-29 DIAGNOSIS — F332 Major depressive disorder, recurrent severe without psychotic features: Secondary | ICD-10-CM | POA: Diagnosis not present

## 2020-10-29 DIAGNOSIS — I10 Essential (primary) hypertension: Secondary | ICD-10-CM | POA: Diagnosis not present

## 2020-10-30 DIAGNOSIS — R278 Other lack of coordination: Secondary | ICD-10-CM | POA: Diagnosis not present

## 2020-10-31 DIAGNOSIS — F332 Major depressive disorder, recurrent severe without psychotic features: Secondary | ICD-10-CM | POA: Diagnosis not present

## 2020-10-31 DIAGNOSIS — I10 Essential (primary) hypertension: Secondary | ICD-10-CM | POA: Diagnosis not present

## 2020-10-31 DIAGNOSIS — E119 Type 2 diabetes mellitus without complications: Secondary | ICD-10-CM | POA: Diagnosis not present

## 2020-10-31 DIAGNOSIS — I252 Old myocardial infarction: Secondary | ICD-10-CM | POA: Diagnosis not present

## 2020-11-01 DIAGNOSIS — R278 Other lack of coordination: Secondary | ICD-10-CM | POA: Diagnosis not present

## 2020-11-05 DIAGNOSIS — E119 Type 2 diabetes mellitus without complications: Secondary | ICD-10-CM | POA: Diagnosis not present

## 2020-11-05 DIAGNOSIS — I252 Old myocardial infarction: Secondary | ICD-10-CM | POA: Diagnosis not present

## 2020-11-05 DIAGNOSIS — I10 Essential (primary) hypertension: Secondary | ICD-10-CM | POA: Diagnosis not present

## 2020-11-05 DIAGNOSIS — F332 Major depressive disorder, recurrent severe without psychotic features: Secondary | ICD-10-CM | POA: Diagnosis not present

## 2020-11-06 DIAGNOSIS — G459 Transient cerebral ischemic attack, unspecified: Secondary | ICD-10-CM | POA: Diagnosis not present

## 2020-11-06 DIAGNOSIS — R278 Other lack of coordination: Secondary | ICD-10-CM | POA: Diagnosis not present

## 2020-11-06 DIAGNOSIS — I10 Essential (primary) hypertension: Secondary | ICD-10-CM | POA: Diagnosis not present

## 2020-11-06 DIAGNOSIS — I2510 Atherosclerotic heart disease of native coronary artery without angina pectoris: Secondary | ICD-10-CM | POA: Diagnosis not present

## 2020-11-06 DIAGNOSIS — I252 Old myocardial infarction: Secondary | ICD-10-CM | POA: Diagnosis not present

## 2020-11-06 DIAGNOSIS — I509 Heart failure, unspecified: Secondary | ICD-10-CM | POA: Diagnosis not present

## 2020-11-06 DIAGNOSIS — F329 Major depressive disorder, single episode, unspecified: Secondary | ICD-10-CM | POA: Diagnosis not present

## 2020-11-06 DIAGNOSIS — E559 Vitamin D deficiency, unspecified: Secondary | ICD-10-CM | POA: Diagnosis not present

## 2020-11-06 DIAGNOSIS — E785 Hyperlipidemia, unspecified: Secondary | ICD-10-CM | POA: Diagnosis not present

## 2020-11-06 DIAGNOSIS — E119 Type 2 diabetes mellitus without complications: Secondary | ICD-10-CM | POA: Diagnosis not present

## 2020-11-06 DIAGNOSIS — E038 Other specified hypothyroidism: Secondary | ICD-10-CM | POA: Diagnosis not present

## 2020-11-07 DIAGNOSIS — I252 Old myocardial infarction: Secondary | ICD-10-CM | POA: Diagnosis not present

## 2020-11-07 DIAGNOSIS — U071 COVID-19: Secondary | ICD-10-CM | POA: Diagnosis not present

## 2020-11-07 DIAGNOSIS — Z20828 Contact with and (suspected) exposure to other viral communicable diseases: Secondary | ICD-10-CM | POA: Diagnosis not present

## 2020-11-07 DIAGNOSIS — E119 Type 2 diabetes mellitus without complications: Secondary | ICD-10-CM | POA: Diagnosis not present

## 2020-11-07 DIAGNOSIS — F332 Major depressive disorder, recurrent severe without psychotic features: Secondary | ICD-10-CM | POA: Diagnosis not present

## 2020-11-07 DIAGNOSIS — I10 Essential (primary) hypertension: Secondary | ICD-10-CM | POA: Diagnosis not present

## 2020-11-08 DIAGNOSIS — R278 Other lack of coordination: Secondary | ICD-10-CM | POA: Diagnosis not present

## 2020-11-12 DIAGNOSIS — F332 Major depressive disorder, recurrent severe without psychotic features: Secondary | ICD-10-CM | POA: Diagnosis not present

## 2020-11-12 DIAGNOSIS — E119 Type 2 diabetes mellitus without complications: Secondary | ICD-10-CM | POA: Diagnosis not present

## 2020-11-12 DIAGNOSIS — I10 Essential (primary) hypertension: Secondary | ICD-10-CM | POA: Diagnosis not present

## 2020-11-12 DIAGNOSIS — I252 Old myocardial infarction: Secondary | ICD-10-CM | POA: Diagnosis not present

## 2020-11-13 DIAGNOSIS — R278 Other lack of coordination: Secondary | ICD-10-CM | POA: Diagnosis not present

## 2020-11-14 DIAGNOSIS — I10 Essential (primary) hypertension: Secondary | ICD-10-CM | POA: Diagnosis not present

## 2020-11-14 DIAGNOSIS — I252 Old myocardial infarction: Secondary | ICD-10-CM | POA: Diagnosis not present

## 2020-11-14 DIAGNOSIS — F332 Major depressive disorder, recurrent severe without psychotic features: Secondary | ICD-10-CM | POA: Diagnosis not present

## 2020-11-14 DIAGNOSIS — E119 Type 2 diabetes mellitus without complications: Secondary | ICD-10-CM | POA: Diagnosis not present

## 2020-11-15 DIAGNOSIS — R278 Other lack of coordination: Secondary | ICD-10-CM | POA: Diagnosis not present

## 2020-11-15 DIAGNOSIS — K219 Gastro-esophageal reflux disease without esophagitis: Secondary | ICD-10-CM | POA: Diagnosis not present

## 2020-11-15 DIAGNOSIS — E119 Type 2 diabetes mellitus without complications: Secondary | ICD-10-CM | POA: Diagnosis not present

## 2020-11-15 DIAGNOSIS — I771 Stricture of artery: Secondary | ICD-10-CM | POA: Diagnosis not present

## 2020-11-15 DIAGNOSIS — I10 Essential (primary) hypertension: Secondary | ICD-10-CM | POA: Diagnosis not present

## 2020-11-15 DIAGNOSIS — I2510 Atherosclerotic heart disease of native coronary artery without angina pectoris: Secondary | ICD-10-CM | POA: Diagnosis not present

## 2020-11-15 DIAGNOSIS — I739 Peripheral vascular disease, unspecified: Secondary | ICD-10-CM | POA: Diagnosis not present

## 2020-11-15 DIAGNOSIS — I252 Old myocardial infarction: Secondary | ICD-10-CM | POA: Diagnosis not present

## 2020-11-15 DIAGNOSIS — G4700 Insomnia, unspecified: Secondary | ICD-10-CM | POA: Diagnosis not present

## 2020-11-18 DIAGNOSIS — E119 Type 2 diabetes mellitus without complications: Secondary | ICD-10-CM | POA: Diagnosis not present

## 2020-11-18 DIAGNOSIS — I10 Essential (primary) hypertension: Secondary | ICD-10-CM | POA: Diagnosis not present

## 2020-11-18 DIAGNOSIS — F332 Major depressive disorder, recurrent severe without psychotic features: Secondary | ICD-10-CM | POA: Diagnosis not present

## 2020-11-18 DIAGNOSIS — I252 Old myocardial infarction: Secondary | ICD-10-CM | POA: Diagnosis not present

## 2020-11-20 DIAGNOSIS — R278 Other lack of coordination: Secondary | ICD-10-CM | POA: Diagnosis not present

## 2020-11-20 DIAGNOSIS — F32A Depression, unspecified: Secondary | ICD-10-CM | POA: Diagnosis not present

## 2020-11-20 DIAGNOSIS — Z20828 Contact with and (suspected) exposure to other viral communicable diseases: Secondary | ICD-10-CM | POA: Diagnosis not present

## 2020-11-20 DIAGNOSIS — U071 COVID-19: Secondary | ICD-10-CM | POA: Diagnosis not present

## 2020-11-20 DIAGNOSIS — F419 Anxiety disorder, unspecified: Secondary | ICD-10-CM | POA: Diagnosis not present

## 2020-11-21 DIAGNOSIS — M79674 Pain in right toe(s): Secondary | ICD-10-CM | POA: Diagnosis not present

## 2020-11-21 DIAGNOSIS — F332 Major depressive disorder, recurrent severe without psychotic features: Secondary | ICD-10-CM | POA: Diagnosis not present

## 2020-11-21 DIAGNOSIS — Z20828 Contact with and (suspected) exposure to other viral communicable diseases: Secondary | ICD-10-CM | POA: Diagnosis not present

## 2020-11-21 DIAGNOSIS — U071 COVID-19: Secondary | ICD-10-CM | POA: Diagnosis not present

## 2020-11-21 DIAGNOSIS — I252 Old myocardial infarction: Secondary | ICD-10-CM | POA: Diagnosis not present

## 2020-11-21 DIAGNOSIS — B351 Tinea unguium: Secondary | ICD-10-CM | POA: Diagnosis not present

## 2020-11-21 DIAGNOSIS — E119 Type 2 diabetes mellitus without complications: Secondary | ICD-10-CM | POA: Diagnosis not present

## 2020-11-21 DIAGNOSIS — M79675 Pain in left toe(s): Secondary | ICD-10-CM | POA: Diagnosis not present

## 2020-11-21 DIAGNOSIS — I10 Essential (primary) hypertension: Secondary | ICD-10-CM | POA: Diagnosis not present

## 2020-11-22 DIAGNOSIS — R278 Other lack of coordination: Secondary | ICD-10-CM | POA: Diagnosis not present

## 2020-11-26 DIAGNOSIS — I252 Old myocardial infarction: Secondary | ICD-10-CM | POA: Diagnosis not present

## 2020-11-26 DIAGNOSIS — E119 Type 2 diabetes mellitus without complications: Secondary | ICD-10-CM | POA: Diagnosis not present

## 2020-11-26 DIAGNOSIS — I10 Essential (primary) hypertension: Secondary | ICD-10-CM | POA: Diagnosis not present

## 2020-11-26 DIAGNOSIS — F332 Major depressive disorder, recurrent severe without psychotic features: Secondary | ICD-10-CM | POA: Diagnosis not present

## 2020-11-27 DIAGNOSIS — R278 Other lack of coordination: Secondary | ICD-10-CM | POA: Diagnosis not present

## 2020-11-28 DIAGNOSIS — E119 Type 2 diabetes mellitus without complications: Secondary | ICD-10-CM | POA: Diagnosis not present

## 2020-11-28 DIAGNOSIS — F332 Major depressive disorder, recurrent severe without psychotic features: Secondary | ICD-10-CM | POA: Diagnosis not present

## 2020-11-28 DIAGNOSIS — I252 Old myocardial infarction: Secondary | ICD-10-CM | POA: Diagnosis not present

## 2020-11-28 DIAGNOSIS — I10 Essential (primary) hypertension: Secondary | ICD-10-CM | POA: Diagnosis not present

## 2020-11-29 DIAGNOSIS — R278 Other lack of coordination: Secondary | ICD-10-CM | POA: Diagnosis not present

## 2020-12-02 DIAGNOSIS — I10 Essential (primary) hypertension: Secondary | ICD-10-CM | POA: Diagnosis not present

## 2020-12-02 DIAGNOSIS — F332 Major depressive disorder, recurrent severe without psychotic features: Secondary | ICD-10-CM | POA: Diagnosis not present

## 2020-12-02 DIAGNOSIS — E119 Type 2 diabetes mellitus without complications: Secondary | ICD-10-CM | POA: Diagnosis not present

## 2020-12-02 DIAGNOSIS — I252 Old myocardial infarction: Secondary | ICD-10-CM | POA: Diagnosis not present

## 2020-12-04 DIAGNOSIS — R278 Other lack of coordination: Secondary | ICD-10-CM | POA: Diagnosis not present

## 2020-12-05 DIAGNOSIS — I10 Essential (primary) hypertension: Secondary | ICD-10-CM | POA: Diagnosis not present

## 2020-12-05 DIAGNOSIS — I252 Old myocardial infarction: Secondary | ICD-10-CM | POA: Diagnosis not present

## 2020-12-05 DIAGNOSIS — F332 Major depressive disorder, recurrent severe without psychotic features: Secondary | ICD-10-CM | POA: Diagnosis not present

## 2020-12-05 DIAGNOSIS — E119 Type 2 diabetes mellitus without complications: Secondary | ICD-10-CM | POA: Diagnosis not present

## 2020-12-06 DIAGNOSIS — R278 Other lack of coordination: Secondary | ICD-10-CM | POA: Diagnosis not present

## 2020-12-07 DIAGNOSIS — E038 Other specified hypothyroidism: Secondary | ICD-10-CM | POA: Diagnosis not present

## 2020-12-07 DIAGNOSIS — I252 Old myocardial infarction: Secondary | ICD-10-CM | POA: Diagnosis not present

## 2020-12-07 DIAGNOSIS — I2510 Atherosclerotic heart disease of native coronary artery without angina pectoris: Secondary | ICD-10-CM | POA: Diagnosis not present

## 2020-12-07 DIAGNOSIS — E559 Vitamin D deficiency, unspecified: Secondary | ICD-10-CM | POA: Diagnosis not present

## 2020-12-07 DIAGNOSIS — G459 Transient cerebral ischemic attack, unspecified: Secondary | ICD-10-CM | POA: Diagnosis not present

## 2020-12-07 DIAGNOSIS — E785 Hyperlipidemia, unspecified: Secondary | ICD-10-CM | POA: Diagnosis not present

## 2020-12-07 DIAGNOSIS — I10 Essential (primary) hypertension: Secondary | ICD-10-CM | POA: Diagnosis not present

## 2020-12-07 DIAGNOSIS — F329 Major depressive disorder, single episode, unspecified: Secondary | ICD-10-CM | POA: Diagnosis not present

## 2020-12-07 DIAGNOSIS — E119 Type 2 diabetes mellitus without complications: Secondary | ICD-10-CM | POA: Diagnosis not present

## 2020-12-07 DIAGNOSIS — I509 Heart failure, unspecified: Secondary | ICD-10-CM | POA: Diagnosis not present

## 2020-12-10 DIAGNOSIS — F32A Depression, unspecified: Secondary | ICD-10-CM | POA: Diagnosis not present

## 2020-12-10 DIAGNOSIS — F332 Major depressive disorder, recurrent severe without psychotic features: Secondary | ICD-10-CM | POA: Diagnosis not present

## 2020-12-10 DIAGNOSIS — E119 Type 2 diabetes mellitus without complications: Secondary | ICD-10-CM | POA: Diagnosis not present

## 2020-12-10 DIAGNOSIS — I10 Essential (primary) hypertension: Secondary | ICD-10-CM | POA: Diagnosis not present

## 2020-12-10 DIAGNOSIS — I252 Old myocardial infarction: Secondary | ICD-10-CM | POA: Diagnosis not present

## 2020-12-10 DIAGNOSIS — F419 Anxiety disorder, unspecified: Secondary | ICD-10-CM | POA: Diagnosis not present

## 2020-12-12 DIAGNOSIS — E119 Type 2 diabetes mellitus without complications: Secondary | ICD-10-CM | POA: Diagnosis not present

## 2020-12-12 DIAGNOSIS — I252 Old myocardial infarction: Secondary | ICD-10-CM | POA: Diagnosis not present

## 2020-12-12 DIAGNOSIS — I10 Essential (primary) hypertension: Secondary | ICD-10-CM | POA: Diagnosis not present

## 2020-12-12 DIAGNOSIS — F332 Major depressive disorder, recurrent severe without psychotic features: Secondary | ICD-10-CM | POA: Diagnosis not present

## 2020-12-13 DIAGNOSIS — E785 Hyperlipidemia, unspecified: Secondary | ICD-10-CM | POA: Diagnosis not present

## 2020-12-13 DIAGNOSIS — G4700 Insomnia, unspecified: Secondary | ICD-10-CM | POA: Diagnosis not present

## 2020-12-13 DIAGNOSIS — E119 Type 2 diabetes mellitus without complications: Secondary | ICD-10-CM | POA: Diagnosis not present

## 2020-12-13 DIAGNOSIS — R911 Solitary pulmonary nodule: Secondary | ICD-10-CM | POA: Diagnosis not present

## 2020-12-13 DIAGNOSIS — I771 Stricture of artery: Secondary | ICD-10-CM | POA: Diagnosis not present

## 2020-12-13 DIAGNOSIS — K219 Gastro-esophageal reflux disease without esophagitis: Secondary | ICD-10-CM | POA: Diagnosis not present

## 2020-12-13 DIAGNOSIS — I2510 Atherosclerotic heart disease of native coronary artery without angina pectoris: Secondary | ICD-10-CM | POA: Diagnosis not present

## 2020-12-13 DIAGNOSIS — I739 Peripheral vascular disease, unspecified: Secondary | ICD-10-CM | POA: Diagnosis not present

## 2020-12-13 DIAGNOSIS — I509 Heart failure, unspecified: Secondary | ICD-10-CM | POA: Diagnosis not present

## 2020-12-13 DIAGNOSIS — I10 Essential (primary) hypertension: Secondary | ICD-10-CM | POA: Diagnosis not present

## 2020-12-13 DIAGNOSIS — G459 Transient cerebral ischemic attack, unspecified: Secondary | ICD-10-CM | POA: Diagnosis not present

## 2020-12-13 DIAGNOSIS — I252 Old myocardial infarction: Secondary | ICD-10-CM | POA: Diagnosis not present

## 2020-12-17 DIAGNOSIS — F332 Major depressive disorder, recurrent severe without psychotic features: Secondary | ICD-10-CM | POA: Diagnosis not present

## 2020-12-17 DIAGNOSIS — I10 Essential (primary) hypertension: Secondary | ICD-10-CM | POA: Diagnosis not present

## 2020-12-17 DIAGNOSIS — E119 Type 2 diabetes mellitus without complications: Secondary | ICD-10-CM | POA: Diagnosis not present

## 2020-12-17 DIAGNOSIS — I252 Old myocardial infarction: Secondary | ICD-10-CM | POA: Diagnosis not present

## 2020-12-19 DIAGNOSIS — I252 Old myocardial infarction: Secondary | ICD-10-CM | POA: Diagnosis not present

## 2020-12-19 DIAGNOSIS — E119 Type 2 diabetes mellitus without complications: Secondary | ICD-10-CM | POA: Diagnosis not present

## 2020-12-19 DIAGNOSIS — I10 Essential (primary) hypertension: Secondary | ICD-10-CM | POA: Diagnosis not present

## 2020-12-19 DIAGNOSIS — F332 Major depressive disorder, recurrent severe without psychotic features: Secondary | ICD-10-CM | POA: Diagnosis not present

## 2020-12-26 DIAGNOSIS — M25561 Pain in right knee: Secondary | ICD-10-CM | POA: Diagnosis not present

## 2020-12-26 DIAGNOSIS — F332 Major depressive disorder, recurrent severe without psychotic features: Secondary | ICD-10-CM | POA: Diagnosis not present

## 2020-12-26 DIAGNOSIS — I10 Essential (primary) hypertension: Secondary | ICD-10-CM | POA: Diagnosis not present

## 2020-12-26 DIAGNOSIS — E119 Type 2 diabetes mellitus without complications: Secondary | ICD-10-CM | POA: Diagnosis not present

## 2020-12-26 DIAGNOSIS — Z23 Encounter for immunization: Secondary | ICD-10-CM | POA: Diagnosis not present

## 2020-12-26 DIAGNOSIS — G8929 Other chronic pain: Secondary | ICD-10-CM | POA: Diagnosis not present

## 2020-12-26 DIAGNOSIS — M25532 Pain in left wrist: Secondary | ICD-10-CM | POA: Diagnosis not present

## 2020-12-26 DIAGNOSIS — M25512 Pain in left shoulder: Secondary | ICD-10-CM | POA: Diagnosis not present

## 2020-12-26 DIAGNOSIS — G894 Chronic pain syndrome: Secondary | ICD-10-CM | POA: Diagnosis not present

## 2020-12-26 DIAGNOSIS — I252 Old myocardial infarction: Secondary | ICD-10-CM | POA: Diagnosis not present

## 2020-12-26 DIAGNOSIS — M545 Low back pain, unspecified: Secondary | ICD-10-CM | POA: Diagnosis not present

## 2020-12-28 DIAGNOSIS — I10 Essential (primary) hypertension: Secondary | ICD-10-CM | POA: Diagnosis not present

## 2020-12-28 DIAGNOSIS — F332 Major depressive disorder, recurrent severe without psychotic features: Secondary | ICD-10-CM | POA: Diagnosis not present

## 2020-12-28 DIAGNOSIS — I252 Old myocardial infarction: Secondary | ICD-10-CM | POA: Diagnosis not present

## 2020-12-28 DIAGNOSIS — F32A Depression, unspecified: Secondary | ICD-10-CM | POA: Diagnosis not present

## 2020-12-28 DIAGNOSIS — E119 Type 2 diabetes mellitus without complications: Secondary | ICD-10-CM | POA: Diagnosis not present

## 2020-12-31 DIAGNOSIS — I10 Essential (primary) hypertension: Secondary | ICD-10-CM | POA: Diagnosis not present

## 2020-12-31 DIAGNOSIS — E119 Type 2 diabetes mellitus without complications: Secondary | ICD-10-CM | POA: Diagnosis not present

## 2020-12-31 DIAGNOSIS — G459 Transient cerebral ischemic attack, unspecified: Secondary | ICD-10-CM | POA: Diagnosis not present

## 2020-12-31 DIAGNOSIS — F332 Major depressive disorder, recurrent severe without psychotic features: Secondary | ICD-10-CM | POA: Diagnosis not present

## 2020-12-31 DIAGNOSIS — E038 Other specified hypothyroidism: Secondary | ICD-10-CM | POA: Diagnosis not present

## 2020-12-31 DIAGNOSIS — D518 Other vitamin B12 deficiency anemias: Secondary | ICD-10-CM | POA: Diagnosis not present

## 2020-12-31 DIAGNOSIS — E785 Hyperlipidemia, unspecified: Secondary | ICD-10-CM | POA: Diagnosis not present

## 2020-12-31 DIAGNOSIS — I252 Old myocardial infarction: Secondary | ICD-10-CM | POA: Diagnosis not present

## 2020-12-31 DIAGNOSIS — I509 Heart failure, unspecified: Secondary | ICD-10-CM | POA: Diagnosis not present

## 2020-12-31 DIAGNOSIS — I2510 Atherosclerotic heart disease of native coronary artery without angina pectoris: Secondary | ICD-10-CM | POA: Diagnosis not present

## 2020-12-31 DIAGNOSIS — F329 Major depressive disorder, single episode, unspecified: Secondary | ICD-10-CM | POA: Diagnosis not present

## 2020-12-31 DIAGNOSIS — E559 Vitamin D deficiency, unspecified: Secondary | ICD-10-CM | POA: Diagnosis not present

## 2021-01-02 DIAGNOSIS — I252 Old myocardial infarction: Secondary | ICD-10-CM | POA: Diagnosis not present

## 2021-01-02 DIAGNOSIS — F332 Major depressive disorder, recurrent severe without psychotic features: Secondary | ICD-10-CM | POA: Diagnosis not present

## 2021-01-02 DIAGNOSIS — I10 Essential (primary) hypertension: Secondary | ICD-10-CM | POA: Diagnosis not present

## 2021-01-02 DIAGNOSIS — E119 Type 2 diabetes mellitus without complications: Secondary | ICD-10-CM | POA: Diagnosis not present

## 2021-01-03 DIAGNOSIS — M25561 Pain in right knee: Secondary | ICD-10-CM | POA: Diagnosis not present

## 2021-01-03 DIAGNOSIS — M545 Low back pain, unspecified: Secondary | ICD-10-CM | POA: Diagnosis not present

## 2021-01-03 DIAGNOSIS — M25532 Pain in left wrist: Secondary | ICD-10-CM | POA: Diagnosis not present

## 2021-01-03 DIAGNOSIS — Z981 Arthrodesis status: Secondary | ICD-10-CM | POA: Diagnosis not present

## 2021-01-03 DIAGNOSIS — I10 Essential (primary) hypertension: Secondary | ICD-10-CM | POA: Diagnosis not present

## 2021-01-03 DIAGNOSIS — M1711 Unilateral primary osteoarthritis, right knee: Secondary | ICD-10-CM | POA: Diagnosis not present

## 2021-01-03 DIAGNOSIS — M25512 Pain in left shoulder: Secondary | ICD-10-CM | POA: Diagnosis not present

## 2021-01-03 DIAGNOSIS — M19032 Primary osteoarthritis, left wrist: Secondary | ICD-10-CM | POA: Diagnosis not present

## 2021-01-03 DIAGNOSIS — M4316 Spondylolisthesis, lumbar region: Secondary | ICD-10-CM | POA: Diagnosis not present

## 2021-01-03 DIAGNOSIS — M47816 Spondylosis without myelopathy or radiculopathy, lumbar region: Secondary | ICD-10-CM | POA: Diagnosis not present

## 2021-01-03 DIAGNOSIS — G8929 Other chronic pain: Secondary | ICD-10-CM | POA: Diagnosis not present

## 2021-01-03 DIAGNOSIS — M5136 Other intervertebral disc degeneration, lumbar region: Secondary | ICD-10-CM | POA: Diagnosis not present

## 2021-01-03 DIAGNOSIS — M19012 Primary osteoarthritis, left shoulder: Secondary | ICD-10-CM | POA: Diagnosis not present

## 2021-01-07 DIAGNOSIS — E119 Type 2 diabetes mellitus without complications: Secondary | ICD-10-CM | POA: Diagnosis not present

## 2021-01-07 DIAGNOSIS — I252 Old myocardial infarction: Secondary | ICD-10-CM | POA: Diagnosis not present

## 2021-01-07 DIAGNOSIS — F332 Major depressive disorder, recurrent severe without psychotic features: Secondary | ICD-10-CM | POA: Diagnosis not present

## 2021-01-07 DIAGNOSIS — I10 Essential (primary) hypertension: Secondary | ICD-10-CM | POA: Diagnosis not present

## 2021-01-08 DIAGNOSIS — G8929 Other chronic pain: Secondary | ICD-10-CM | POA: Diagnosis not present

## 2021-01-08 DIAGNOSIS — M545 Low back pain, unspecified: Secondary | ICD-10-CM | POA: Diagnosis not present

## 2021-01-08 DIAGNOSIS — M25561 Pain in right knee: Secondary | ICD-10-CM | POA: Diagnosis not present

## 2021-01-08 DIAGNOSIS — M1731 Unilateral post-traumatic osteoarthritis, right knee: Secondary | ICD-10-CM | POA: Diagnosis not present

## 2021-01-08 DIAGNOSIS — M25512 Pain in left shoulder: Secondary | ICD-10-CM | POA: Diagnosis not present

## 2021-01-08 DIAGNOSIS — M47816 Spondylosis without myelopathy or radiculopathy, lumbar region: Secondary | ICD-10-CM | POA: Diagnosis not present

## 2021-01-08 DIAGNOSIS — M19012 Primary osteoarthritis, left shoulder: Secondary | ICD-10-CM | POA: Diagnosis not present

## 2021-01-08 DIAGNOSIS — M25532 Pain in left wrist: Secondary | ICD-10-CM | POA: Diagnosis not present

## 2021-01-09 DIAGNOSIS — I10 Essential (primary) hypertension: Secondary | ICD-10-CM | POA: Diagnosis not present

## 2021-01-09 DIAGNOSIS — F332 Major depressive disorder, recurrent severe without psychotic features: Secondary | ICD-10-CM | POA: Diagnosis not present

## 2021-01-09 DIAGNOSIS — E119 Type 2 diabetes mellitus without complications: Secondary | ICD-10-CM | POA: Diagnosis not present

## 2021-01-09 DIAGNOSIS — I252 Old myocardial infarction: Secondary | ICD-10-CM | POA: Diagnosis not present

## 2021-01-10 DIAGNOSIS — E119 Type 2 diabetes mellitus without complications: Secondary | ICD-10-CM | POA: Diagnosis not present

## 2021-01-10 DIAGNOSIS — I739 Peripheral vascular disease, unspecified: Secondary | ICD-10-CM | POA: Diagnosis not present

## 2021-01-10 DIAGNOSIS — I252 Old myocardial infarction: Secondary | ICD-10-CM | POA: Diagnosis not present

## 2021-01-10 DIAGNOSIS — E785 Hyperlipidemia, unspecified: Secondary | ICD-10-CM | POA: Diagnosis not present

## 2021-01-10 DIAGNOSIS — M5136 Other intervertebral disc degeneration, lumbar region: Secondary | ICD-10-CM | POA: Diagnosis not present

## 2021-01-10 DIAGNOSIS — I2510 Atherosclerotic heart disease of native coronary artery without angina pectoris: Secondary | ICD-10-CM | POA: Diagnosis not present

## 2021-01-10 DIAGNOSIS — I509 Heart failure, unspecified: Secondary | ICD-10-CM | POA: Diagnosis not present

## 2021-01-10 DIAGNOSIS — G4700 Insomnia, unspecified: Secondary | ICD-10-CM | POA: Diagnosis not present

## 2021-01-10 DIAGNOSIS — K5900 Constipation, unspecified: Secondary | ICD-10-CM | POA: Diagnosis not present

## 2021-01-10 DIAGNOSIS — G894 Chronic pain syndrome: Secondary | ICD-10-CM | POA: Diagnosis not present

## 2021-01-10 DIAGNOSIS — G459 Transient cerebral ischemic attack, unspecified: Secondary | ICD-10-CM | POA: Diagnosis not present

## 2021-01-10 DIAGNOSIS — I10 Essential (primary) hypertension: Secondary | ICD-10-CM | POA: Diagnosis not present

## 2021-01-13 DIAGNOSIS — I252 Old myocardial infarction: Secondary | ICD-10-CM | POA: Diagnosis not present

## 2021-01-13 DIAGNOSIS — F332 Major depressive disorder, recurrent severe without psychotic features: Secondary | ICD-10-CM | POA: Diagnosis not present

## 2021-01-13 DIAGNOSIS — E119 Type 2 diabetes mellitus without complications: Secondary | ICD-10-CM | POA: Diagnosis not present

## 2021-01-13 DIAGNOSIS — I10 Essential (primary) hypertension: Secondary | ICD-10-CM | POA: Diagnosis not present

## 2021-01-15 DIAGNOSIS — I10 Essential (primary) hypertension: Secondary | ICD-10-CM | POA: Diagnosis not present

## 2021-01-15 DIAGNOSIS — E119 Type 2 diabetes mellitus without complications: Secondary | ICD-10-CM | POA: Diagnosis not present

## 2021-01-15 DIAGNOSIS — F419 Anxiety disorder, unspecified: Secondary | ICD-10-CM | POA: Diagnosis not present

## 2021-01-15 DIAGNOSIS — F32A Depression, unspecified: Secondary | ICD-10-CM | POA: Diagnosis not present

## 2021-01-15 DIAGNOSIS — F332 Major depressive disorder, recurrent severe without psychotic features: Secondary | ICD-10-CM | POA: Diagnosis not present

## 2021-01-15 DIAGNOSIS — I252 Old myocardial infarction: Secondary | ICD-10-CM | POA: Diagnosis not present

## 2021-01-22 DIAGNOSIS — I10 Essential (primary) hypertension: Secondary | ICD-10-CM | POA: Diagnosis not present

## 2021-01-22 DIAGNOSIS — F332 Major depressive disorder, recurrent severe without psychotic features: Secondary | ICD-10-CM | POA: Diagnosis not present

## 2021-01-22 DIAGNOSIS — E119 Type 2 diabetes mellitus without complications: Secondary | ICD-10-CM | POA: Diagnosis not present

## 2021-01-22 DIAGNOSIS — I252 Old myocardial infarction: Secondary | ICD-10-CM | POA: Diagnosis not present

## 2021-01-23 DIAGNOSIS — I252 Old myocardial infarction: Secondary | ICD-10-CM | POA: Diagnosis not present

## 2021-01-23 DIAGNOSIS — I10 Essential (primary) hypertension: Secondary | ICD-10-CM | POA: Diagnosis not present

## 2021-01-23 DIAGNOSIS — F332 Major depressive disorder, recurrent severe without psychotic features: Secondary | ICD-10-CM | POA: Diagnosis not present

## 2021-01-23 DIAGNOSIS — E119 Type 2 diabetes mellitus without complications: Secondary | ICD-10-CM | POA: Diagnosis not present

## 2021-01-27 DIAGNOSIS — F332 Major depressive disorder, recurrent severe without psychotic features: Secondary | ICD-10-CM | POA: Diagnosis not present

## 2021-01-27 DIAGNOSIS — I252 Old myocardial infarction: Secondary | ICD-10-CM | POA: Diagnosis not present

## 2021-01-27 DIAGNOSIS — E119 Type 2 diabetes mellitus without complications: Secondary | ICD-10-CM | POA: Diagnosis not present

## 2021-01-27 DIAGNOSIS — I10 Essential (primary) hypertension: Secondary | ICD-10-CM | POA: Diagnosis not present

## 2021-01-29 DIAGNOSIS — I10 Essential (primary) hypertension: Secondary | ICD-10-CM | POA: Diagnosis not present

## 2021-01-29 DIAGNOSIS — E119 Type 2 diabetes mellitus without complications: Secondary | ICD-10-CM | POA: Diagnosis not present

## 2021-01-29 DIAGNOSIS — I252 Old myocardial infarction: Secondary | ICD-10-CM | POA: Diagnosis not present

## 2021-01-29 DIAGNOSIS — F332 Major depressive disorder, recurrent severe without psychotic features: Secondary | ICD-10-CM | POA: Diagnosis not present

## 2021-02-04 DIAGNOSIS — G4700 Insomnia, unspecified: Secondary | ICD-10-CM | POA: Diagnosis not present

## 2021-02-04 DIAGNOSIS — E785 Hyperlipidemia, unspecified: Secondary | ICD-10-CM | POA: Diagnosis not present

## 2021-02-04 DIAGNOSIS — G894 Chronic pain syndrome: Secondary | ICD-10-CM | POA: Diagnosis not present

## 2021-02-04 DIAGNOSIS — G459 Transient cerebral ischemic attack, unspecified: Secondary | ICD-10-CM | POA: Diagnosis not present

## 2021-02-04 DIAGNOSIS — E119 Type 2 diabetes mellitus without complications: Secondary | ICD-10-CM | POA: Diagnosis not present

## 2021-02-04 DIAGNOSIS — D518 Other vitamin B12 deficiency anemias: Secondary | ICD-10-CM | POA: Diagnosis not present

## 2021-02-04 DIAGNOSIS — R911 Solitary pulmonary nodule: Secondary | ICD-10-CM | POA: Diagnosis not present

## 2021-02-04 DIAGNOSIS — E559 Vitamin D deficiency, unspecified: Secondary | ICD-10-CM | POA: Diagnosis not present

## 2021-02-04 DIAGNOSIS — I2510 Atherosclerotic heart disease of native coronary artery without angina pectoris: Secondary | ICD-10-CM | POA: Diagnosis not present

## 2021-02-04 DIAGNOSIS — F332 Major depressive disorder, recurrent severe without psychotic features: Secondary | ICD-10-CM | POA: Diagnosis not present

## 2021-02-04 DIAGNOSIS — I252 Old myocardial infarction: Secondary | ICD-10-CM | POA: Diagnosis not present

## 2021-02-04 DIAGNOSIS — K5900 Constipation, unspecified: Secondary | ICD-10-CM | POA: Diagnosis not present

## 2021-02-04 DIAGNOSIS — I7090 Unspecified atherosclerosis: Secondary | ICD-10-CM | POA: Diagnosis not present

## 2021-02-04 DIAGNOSIS — I10 Essential (primary) hypertension: Secondary | ICD-10-CM | POA: Diagnosis not present

## 2021-02-04 DIAGNOSIS — E038 Other specified hypothyroidism: Secondary | ICD-10-CM | POA: Diagnosis not present

## 2021-02-04 DIAGNOSIS — F329 Major depressive disorder, single episode, unspecified: Secondary | ICD-10-CM | POA: Diagnosis not present

## 2021-02-04 DIAGNOSIS — I739 Peripheral vascular disease, unspecified: Secondary | ICD-10-CM | POA: Diagnosis not present

## 2021-02-04 DIAGNOSIS — I509 Heart failure, unspecified: Secondary | ICD-10-CM | POA: Diagnosis not present

## 2021-02-04 DIAGNOSIS — M5136 Other intervertebral disc degeneration, lumbar region: Secondary | ICD-10-CM | POA: Diagnosis not present

## 2021-02-06 DIAGNOSIS — E119 Type 2 diabetes mellitus without complications: Secondary | ICD-10-CM | POA: Diagnosis not present

## 2021-02-06 DIAGNOSIS — I10 Essential (primary) hypertension: Secondary | ICD-10-CM | POA: Diagnosis not present

## 2021-02-06 DIAGNOSIS — I252 Old myocardial infarction: Secondary | ICD-10-CM | POA: Diagnosis not present

## 2021-02-06 DIAGNOSIS — F332 Major depressive disorder, recurrent severe without psychotic features: Secondary | ICD-10-CM | POA: Diagnosis not present

## 2021-02-10 DIAGNOSIS — I252 Old myocardial infarction: Secondary | ICD-10-CM | POA: Diagnosis not present

## 2021-02-10 DIAGNOSIS — I10 Essential (primary) hypertension: Secondary | ICD-10-CM | POA: Diagnosis not present

## 2021-02-10 DIAGNOSIS — E119 Type 2 diabetes mellitus without complications: Secondary | ICD-10-CM | POA: Diagnosis not present

## 2021-02-10 DIAGNOSIS — F332 Major depressive disorder, recurrent severe without psychotic features: Secondary | ICD-10-CM | POA: Diagnosis not present

## 2021-02-11 DIAGNOSIS — F419 Anxiety disorder, unspecified: Secondary | ICD-10-CM | POA: Diagnosis not present

## 2021-02-11 DIAGNOSIS — F32A Depression, unspecified: Secondary | ICD-10-CM | POA: Diagnosis not present

## 2021-02-13 DIAGNOSIS — E119 Type 2 diabetes mellitus without complications: Secondary | ICD-10-CM | POA: Diagnosis not present

## 2021-02-13 DIAGNOSIS — I252 Old myocardial infarction: Secondary | ICD-10-CM | POA: Diagnosis not present

## 2021-02-13 DIAGNOSIS — F332 Major depressive disorder, recurrent severe without psychotic features: Secondary | ICD-10-CM | POA: Diagnosis not present

## 2021-02-13 DIAGNOSIS — I10 Essential (primary) hypertension: Secondary | ICD-10-CM | POA: Diagnosis not present

## 2021-02-18 DIAGNOSIS — E119 Type 2 diabetes mellitus without complications: Secondary | ICD-10-CM | POA: Diagnosis not present

## 2021-02-18 DIAGNOSIS — F332 Major depressive disorder, recurrent severe without psychotic features: Secondary | ICD-10-CM | POA: Diagnosis not present

## 2021-02-18 DIAGNOSIS — I10 Essential (primary) hypertension: Secondary | ICD-10-CM | POA: Diagnosis not present

## 2021-02-18 DIAGNOSIS — I252 Old myocardial infarction: Secondary | ICD-10-CM | POA: Diagnosis not present

## 2021-02-20 DIAGNOSIS — I10 Essential (primary) hypertension: Secondary | ICD-10-CM | POA: Diagnosis not present

## 2021-02-22 DIAGNOSIS — E119 Type 2 diabetes mellitus without complications: Secondary | ICD-10-CM | POA: Diagnosis not present

## 2021-02-22 DIAGNOSIS — I10 Essential (primary) hypertension: Secondary | ICD-10-CM | POA: Diagnosis not present

## 2021-02-22 DIAGNOSIS — I252 Old myocardial infarction: Secondary | ICD-10-CM | POA: Diagnosis not present

## 2021-02-22 DIAGNOSIS — F332 Major depressive disorder, recurrent severe without psychotic features: Secondary | ICD-10-CM | POA: Diagnosis not present

## 2021-03-05 DIAGNOSIS — F32A Depression, unspecified: Secondary | ICD-10-CM | POA: Diagnosis not present

## 2021-03-05 DIAGNOSIS — F419 Anxiety disorder, unspecified: Secondary | ICD-10-CM | POA: Diagnosis not present

## 2021-03-07 DIAGNOSIS — I509 Heart failure, unspecified: Secondary | ICD-10-CM | POA: Diagnosis not present

## 2021-03-07 DIAGNOSIS — G4700 Insomnia, unspecified: Secondary | ICD-10-CM | POA: Diagnosis not present

## 2021-03-07 DIAGNOSIS — E119 Type 2 diabetes mellitus without complications: Secondary | ICD-10-CM | POA: Diagnosis not present

## 2021-03-07 DIAGNOSIS — I739 Peripheral vascular disease, unspecified: Secondary | ICD-10-CM | POA: Diagnosis not present

## 2021-03-07 DIAGNOSIS — E785 Hyperlipidemia, unspecified: Secondary | ICD-10-CM | POA: Diagnosis not present

## 2021-03-07 DIAGNOSIS — I10 Essential (primary) hypertension: Secondary | ICD-10-CM | POA: Diagnosis not present

## 2021-03-07 DIAGNOSIS — K219 Gastro-esophageal reflux disease without esophagitis: Secondary | ICD-10-CM | POA: Diagnosis not present

## 2021-03-07 DIAGNOSIS — I252 Old myocardial infarction: Secondary | ICD-10-CM | POA: Diagnosis not present

## 2021-03-07 DIAGNOSIS — G459 Transient cerebral ischemic attack, unspecified: Secondary | ICD-10-CM | POA: Diagnosis not present

## 2021-03-07 DIAGNOSIS — I2510 Atherosclerotic heart disease of native coronary artery without angina pectoris: Secondary | ICD-10-CM | POA: Diagnosis not present

## 2021-03-07 DIAGNOSIS — I7090 Unspecified atherosclerosis: Secondary | ICD-10-CM | POA: Diagnosis not present

## 2021-03-08 DIAGNOSIS — R109 Unspecified abdominal pain: Secondary | ICD-10-CM | POA: Diagnosis not present

## 2021-03-18 DIAGNOSIS — K5900 Constipation, unspecified: Secondary | ICD-10-CM | POA: Diagnosis not present

## 2021-03-18 DIAGNOSIS — K429 Umbilical hernia without obstruction or gangrene: Secondary | ICD-10-CM | POA: Diagnosis not present

## 2021-03-19 DIAGNOSIS — Z20828 Contact with and (suspected) exposure to other viral communicable diseases: Secondary | ICD-10-CM | POA: Diagnosis not present

## 2021-03-19 DIAGNOSIS — U071 COVID-19: Secondary | ICD-10-CM | POA: Diagnosis not present

## 2021-03-24 DIAGNOSIS — E559 Vitamin D deficiency, unspecified: Secondary | ICD-10-CM | POA: Diagnosis not present

## 2021-03-24 DIAGNOSIS — I10 Essential (primary) hypertension: Secondary | ICD-10-CM | POA: Diagnosis not present

## 2021-03-24 DIAGNOSIS — E038 Other specified hypothyroidism: Secondary | ICD-10-CM | POA: Diagnosis not present

## 2021-03-24 DIAGNOSIS — E785 Hyperlipidemia, unspecified: Secondary | ICD-10-CM | POA: Diagnosis not present

## 2021-03-24 DIAGNOSIS — D518 Other vitamin B12 deficiency anemias: Secondary | ICD-10-CM | POA: Diagnosis not present

## 2021-03-24 DIAGNOSIS — I252 Old myocardial infarction: Secondary | ICD-10-CM | POA: Diagnosis not present

## 2021-03-24 DIAGNOSIS — F329 Major depressive disorder, single episode, unspecified: Secondary | ICD-10-CM | POA: Diagnosis not present

## 2021-03-24 DIAGNOSIS — G459 Transient cerebral ischemic attack, unspecified: Secondary | ICD-10-CM | POA: Diagnosis not present

## 2021-03-24 DIAGNOSIS — I509 Heart failure, unspecified: Secondary | ICD-10-CM | POA: Diagnosis not present

## 2021-03-24 DIAGNOSIS — I2510 Atherosclerotic heart disease of native coronary artery without angina pectoris: Secondary | ICD-10-CM | POA: Diagnosis not present

## 2021-03-24 DIAGNOSIS — E119 Type 2 diabetes mellitus without complications: Secondary | ICD-10-CM | POA: Diagnosis not present

## 2021-03-26 DIAGNOSIS — I10 Essential (primary) hypertension: Secondary | ICD-10-CM | POA: Diagnosis not present

## 2021-03-27 DIAGNOSIS — F419 Anxiety disorder, unspecified: Secondary | ICD-10-CM | POA: Diagnosis not present

## 2021-03-27 DIAGNOSIS — F32A Depression, unspecified: Secondary | ICD-10-CM | POA: Diagnosis not present

## 2021-04-11 DIAGNOSIS — E785 Hyperlipidemia, unspecified: Secondary | ICD-10-CM | POA: Diagnosis not present

## 2021-04-11 DIAGNOSIS — I10 Essential (primary) hypertension: Secondary | ICD-10-CM | POA: Diagnosis not present

## 2021-04-11 DIAGNOSIS — E119 Type 2 diabetes mellitus without complications: Secondary | ICD-10-CM | POA: Diagnosis not present

## 2021-04-11 DIAGNOSIS — I252 Old myocardial infarction: Secondary | ICD-10-CM | POA: Diagnosis not present

## 2021-04-11 DIAGNOSIS — K219 Gastro-esophageal reflux disease without esophagitis: Secondary | ICD-10-CM | POA: Diagnosis not present

## 2021-04-11 DIAGNOSIS — G459 Transient cerebral ischemic attack, unspecified: Secondary | ICD-10-CM | POA: Diagnosis not present

## 2021-04-11 DIAGNOSIS — I7090 Unspecified atherosclerosis: Secondary | ICD-10-CM | POA: Diagnosis not present

## 2021-04-11 DIAGNOSIS — I771 Stricture of artery: Secondary | ICD-10-CM | POA: Diagnosis not present

## 2021-04-11 DIAGNOSIS — G4700 Insomnia, unspecified: Secondary | ICD-10-CM | POA: Diagnosis not present

## 2021-04-11 DIAGNOSIS — R911 Solitary pulmonary nodule: Secondary | ICD-10-CM | POA: Diagnosis not present

## 2021-04-12 DIAGNOSIS — E038 Other specified hypothyroidism: Secondary | ICD-10-CM | POA: Diagnosis not present

## 2021-04-12 DIAGNOSIS — G459 Transient cerebral ischemic attack, unspecified: Secondary | ICD-10-CM | POA: Diagnosis not present

## 2021-04-12 DIAGNOSIS — E119 Type 2 diabetes mellitus without complications: Secondary | ICD-10-CM | POA: Diagnosis not present

## 2021-04-12 DIAGNOSIS — F329 Major depressive disorder, single episode, unspecified: Secondary | ICD-10-CM | POA: Diagnosis not present

## 2021-04-12 DIAGNOSIS — E785 Hyperlipidemia, unspecified: Secondary | ICD-10-CM | POA: Diagnosis not present

## 2021-04-12 DIAGNOSIS — I509 Heart failure, unspecified: Secondary | ICD-10-CM | POA: Diagnosis not present

## 2021-04-12 DIAGNOSIS — I252 Old myocardial infarction: Secondary | ICD-10-CM | POA: Diagnosis not present

## 2021-04-12 DIAGNOSIS — E559 Vitamin D deficiency, unspecified: Secondary | ICD-10-CM | POA: Diagnosis not present

## 2021-04-12 DIAGNOSIS — I2510 Atherosclerotic heart disease of native coronary artery without angina pectoris: Secondary | ICD-10-CM | POA: Diagnosis not present

## 2021-04-12 DIAGNOSIS — I10 Essential (primary) hypertension: Secondary | ICD-10-CM | POA: Diagnosis not present

## 2021-04-12 DIAGNOSIS — D518 Other vitamin B12 deficiency anemias: Secondary | ICD-10-CM | POA: Diagnosis not present

## 2021-04-16 DIAGNOSIS — D518 Other vitamin B12 deficiency anemias: Secondary | ICD-10-CM | POA: Diagnosis not present

## 2021-04-16 DIAGNOSIS — E7849 Other hyperlipidemia: Secondary | ICD-10-CM | POA: Diagnosis not present

## 2021-04-16 DIAGNOSIS — E038 Other specified hypothyroidism: Secondary | ICD-10-CM | POA: Diagnosis not present

## 2021-04-16 DIAGNOSIS — Z79899 Other long term (current) drug therapy: Secondary | ICD-10-CM | POA: Diagnosis not present

## 2021-04-16 DIAGNOSIS — E559 Vitamin D deficiency, unspecified: Secondary | ICD-10-CM | POA: Diagnosis not present

## 2021-04-16 DIAGNOSIS — E119 Type 2 diabetes mellitus without complications: Secondary | ICD-10-CM | POA: Diagnosis not present

## 2021-04-22 DIAGNOSIS — E119 Type 2 diabetes mellitus without complications: Secondary | ICD-10-CM | POA: Diagnosis not present

## 2021-04-22 DIAGNOSIS — Z79899 Other long term (current) drug therapy: Secondary | ICD-10-CM | POA: Diagnosis not present

## 2021-04-23 DIAGNOSIS — I10 Essential (primary) hypertension: Secondary | ICD-10-CM | POA: Diagnosis not present

## 2021-04-24 DIAGNOSIS — F32A Depression, unspecified: Secondary | ICD-10-CM | POA: Diagnosis not present

## 2021-04-24 DIAGNOSIS — F419 Anxiety disorder, unspecified: Secondary | ICD-10-CM | POA: Diagnosis not present

## 2021-04-25 DIAGNOSIS — J029 Acute pharyngitis, unspecified: Secondary | ICD-10-CM | POA: Diagnosis not present

## 2021-04-26 DIAGNOSIS — R070 Pain in throat: Secondary | ICD-10-CM | POA: Diagnosis not present

## 2021-05-06 DIAGNOSIS — E119 Type 2 diabetes mellitus without complications: Secondary | ICD-10-CM | POA: Diagnosis not present

## 2021-05-06 DIAGNOSIS — I2510 Atherosclerotic heart disease of native coronary artery without angina pectoris: Secondary | ICD-10-CM | POA: Diagnosis not present

## 2021-05-06 DIAGNOSIS — E785 Hyperlipidemia, unspecified: Secondary | ICD-10-CM | POA: Diagnosis not present

## 2021-05-06 DIAGNOSIS — D518 Other vitamin B12 deficiency anemias: Secondary | ICD-10-CM | POA: Diagnosis not present

## 2021-05-06 DIAGNOSIS — F329 Major depressive disorder, single episode, unspecified: Secondary | ICD-10-CM | POA: Diagnosis not present

## 2021-05-06 DIAGNOSIS — I252 Old myocardial infarction: Secondary | ICD-10-CM | POA: Diagnosis not present

## 2021-05-06 DIAGNOSIS — G459 Transient cerebral ischemic attack, unspecified: Secondary | ICD-10-CM | POA: Diagnosis not present

## 2021-05-06 DIAGNOSIS — I10 Essential (primary) hypertension: Secondary | ICD-10-CM | POA: Diagnosis not present

## 2021-05-06 DIAGNOSIS — I509 Heart failure, unspecified: Secondary | ICD-10-CM | POA: Diagnosis not present

## 2021-05-06 DIAGNOSIS — E038 Other specified hypothyroidism: Secondary | ICD-10-CM | POA: Diagnosis not present

## 2021-05-06 DIAGNOSIS — E559 Vitamin D deficiency, unspecified: Secondary | ICD-10-CM | POA: Diagnosis not present

## 2021-05-09 DIAGNOSIS — E785 Hyperlipidemia, unspecified: Secondary | ICD-10-CM | POA: Diagnosis not present

## 2021-05-09 DIAGNOSIS — E119 Type 2 diabetes mellitus without complications: Secondary | ICD-10-CM | POA: Diagnosis not present

## 2021-05-09 DIAGNOSIS — I10 Essential (primary) hypertension: Secondary | ICD-10-CM | POA: Diagnosis not present

## 2021-05-09 DIAGNOSIS — K219 Gastro-esophageal reflux disease without esophagitis: Secondary | ICD-10-CM | POA: Diagnosis not present

## 2021-05-09 DIAGNOSIS — I2510 Atherosclerotic heart disease of native coronary artery without angina pectoris: Secondary | ICD-10-CM | POA: Diagnosis not present

## 2021-05-09 DIAGNOSIS — I252 Old myocardial infarction: Secondary | ICD-10-CM | POA: Diagnosis not present

## 2021-05-09 DIAGNOSIS — I739 Peripheral vascular disease, unspecified: Secondary | ICD-10-CM | POA: Diagnosis not present

## 2021-05-09 DIAGNOSIS — G629 Polyneuropathy, unspecified: Secondary | ICD-10-CM | POA: Diagnosis not present

## 2021-05-09 DIAGNOSIS — G4700 Insomnia, unspecified: Secondary | ICD-10-CM | POA: Diagnosis not present

## 2021-05-09 DIAGNOSIS — I509 Heart failure, unspecified: Secondary | ICD-10-CM | POA: Diagnosis not present

## 2021-05-15 DIAGNOSIS — F419 Anxiety disorder, unspecified: Secondary | ICD-10-CM | POA: Diagnosis not present

## 2021-05-15 DIAGNOSIS — F32A Depression, unspecified: Secondary | ICD-10-CM | POA: Diagnosis not present

## 2021-05-20 DIAGNOSIS — R058 Other specified cough: Secondary | ICD-10-CM | POA: Diagnosis not present

## 2021-05-20 DIAGNOSIS — R062 Wheezing: Secondary | ICD-10-CM | POA: Diagnosis not present

## 2021-05-20 DIAGNOSIS — R0989 Other specified symptoms and signs involving the circulatory and respiratory systems: Secondary | ICD-10-CM | POA: Diagnosis not present

## 2021-05-21 DIAGNOSIS — I10 Essential (primary) hypertension: Secondary | ICD-10-CM | POA: Diagnosis not present

## 2021-05-21 DIAGNOSIS — R059 Cough, unspecified: Secondary | ICD-10-CM | POA: Diagnosis not present

## 2021-06-05 DIAGNOSIS — F32A Depression, unspecified: Secondary | ICD-10-CM | POA: Diagnosis not present

## 2021-06-05 DIAGNOSIS — F419 Anxiety disorder, unspecified: Secondary | ICD-10-CM | POA: Diagnosis not present

## 2021-06-08 DIAGNOSIS — E038 Other specified hypothyroidism: Secondary | ICD-10-CM | POA: Diagnosis not present

## 2021-06-08 DIAGNOSIS — I2510 Atherosclerotic heart disease of native coronary artery without angina pectoris: Secondary | ICD-10-CM | POA: Diagnosis not present

## 2021-06-08 DIAGNOSIS — I10 Essential (primary) hypertension: Secondary | ICD-10-CM | POA: Diagnosis not present

## 2021-06-08 DIAGNOSIS — E119 Type 2 diabetes mellitus without complications: Secondary | ICD-10-CM | POA: Diagnosis not present

## 2021-06-08 DIAGNOSIS — G459 Transient cerebral ischemic attack, unspecified: Secondary | ICD-10-CM | POA: Diagnosis not present

## 2021-06-08 DIAGNOSIS — I252 Old myocardial infarction: Secondary | ICD-10-CM | POA: Diagnosis not present

## 2021-06-08 DIAGNOSIS — D518 Other vitamin B12 deficiency anemias: Secondary | ICD-10-CM | POA: Diagnosis not present

## 2021-06-08 DIAGNOSIS — E785 Hyperlipidemia, unspecified: Secondary | ICD-10-CM | POA: Diagnosis not present

## 2021-06-08 DIAGNOSIS — F329 Major depressive disorder, single episode, unspecified: Secondary | ICD-10-CM | POA: Diagnosis not present

## 2021-06-08 DIAGNOSIS — I509 Heart failure, unspecified: Secondary | ICD-10-CM | POA: Diagnosis not present

## 2021-06-08 DIAGNOSIS — E559 Vitamin D deficiency, unspecified: Secondary | ICD-10-CM | POA: Diagnosis not present

## 2021-06-14 DIAGNOSIS — F419 Anxiety disorder, unspecified: Secondary | ICD-10-CM | POA: Diagnosis not present

## 2021-06-14 DIAGNOSIS — F32A Depression, unspecified: Secondary | ICD-10-CM | POA: Diagnosis not present

## 2021-06-17 DIAGNOSIS — R911 Solitary pulmonary nodule: Secondary | ICD-10-CM | POA: Diagnosis not present

## 2021-06-17 DIAGNOSIS — I252 Old myocardial infarction: Secondary | ICD-10-CM | POA: Diagnosis not present

## 2021-06-17 DIAGNOSIS — I2510 Atherosclerotic heart disease of native coronary artery without angina pectoris: Secondary | ICD-10-CM | POA: Diagnosis not present

## 2021-06-17 DIAGNOSIS — E119 Type 2 diabetes mellitus without complications: Secondary | ICD-10-CM | POA: Diagnosis not present

## 2021-06-17 DIAGNOSIS — I739 Peripheral vascular disease, unspecified: Secondary | ICD-10-CM | POA: Diagnosis not present

## 2021-06-17 DIAGNOSIS — I509 Heart failure, unspecified: Secondary | ICD-10-CM | POA: Diagnosis not present

## 2021-06-17 DIAGNOSIS — I10 Essential (primary) hypertension: Secondary | ICD-10-CM | POA: Diagnosis not present

## 2021-06-17 DIAGNOSIS — M5136 Other intervertebral disc degeneration, lumbar region: Secondary | ICD-10-CM | POA: Diagnosis not present

## 2021-06-17 DIAGNOSIS — I7090 Unspecified atherosclerosis: Secondary | ICD-10-CM | POA: Diagnosis not present

## 2021-06-17 DIAGNOSIS — G4700 Insomnia, unspecified: Secondary | ICD-10-CM | POA: Diagnosis not present

## 2021-06-17 DIAGNOSIS — G459 Transient cerebral ischemic attack, unspecified: Secondary | ICD-10-CM | POA: Diagnosis not present

## 2021-06-17 DIAGNOSIS — I771 Stricture of artery: Secondary | ICD-10-CM | POA: Diagnosis not present

## 2021-06-19 DIAGNOSIS — F32A Depression, unspecified: Secondary | ICD-10-CM | POA: Diagnosis not present

## 2021-06-19 DIAGNOSIS — F419 Anxiety disorder, unspecified: Secondary | ICD-10-CM | POA: Diagnosis not present

## 2021-07-03 DIAGNOSIS — F32A Depression, unspecified: Secondary | ICD-10-CM | POA: Diagnosis not present

## 2021-07-03 DIAGNOSIS — F419 Anxiety disorder, unspecified: Secondary | ICD-10-CM | POA: Diagnosis not present

## 2021-07-09 DIAGNOSIS — I509 Heart failure, unspecified: Secondary | ICD-10-CM | POA: Diagnosis not present

## 2021-07-09 DIAGNOSIS — E559 Vitamin D deficiency, unspecified: Secondary | ICD-10-CM | POA: Diagnosis not present

## 2021-07-09 DIAGNOSIS — D518 Other vitamin B12 deficiency anemias: Secondary | ICD-10-CM | POA: Diagnosis not present

## 2021-07-09 DIAGNOSIS — E119 Type 2 diabetes mellitus without complications: Secondary | ICD-10-CM | POA: Diagnosis not present

## 2021-07-09 DIAGNOSIS — I252 Old myocardial infarction: Secondary | ICD-10-CM | POA: Diagnosis not present

## 2021-07-09 DIAGNOSIS — E785 Hyperlipidemia, unspecified: Secondary | ICD-10-CM | POA: Diagnosis not present

## 2021-07-09 DIAGNOSIS — I2510 Atherosclerotic heart disease of native coronary artery without angina pectoris: Secondary | ICD-10-CM | POA: Diagnosis not present

## 2021-07-09 DIAGNOSIS — F329 Major depressive disorder, single episode, unspecified: Secondary | ICD-10-CM | POA: Diagnosis not present

## 2021-07-09 DIAGNOSIS — G459 Transient cerebral ischemic attack, unspecified: Secondary | ICD-10-CM | POA: Diagnosis not present

## 2021-07-09 DIAGNOSIS — I10 Essential (primary) hypertension: Secondary | ICD-10-CM | POA: Diagnosis not present

## 2021-07-09 DIAGNOSIS — E038 Other specified hypothyroidism: Secondary | ICD-10-CM | POA: Diagnosis not present

## 2021-07-15 DIAGNOSIS — I7090 Unspecified atherosclerosis: Secondary | ICD-10-CM | POA: Diagnosis not present

## 2021-07-15 DIAGNOSIS — I509 Heart failure, unspecified: Secondary | ICD-10-CM | POA: Diagnosis not present

## 2021-07-15 DIAGNOSIS — R911 Solitary pulmonary nodule: Secondary | ICD-10-CM | POA: Diagnosis not present

## 2021-07-15 DIAGNOSIS — M5136 Other intervertebral disc degeneration, lumbar region: Secondary | ICD-10-CM | POA: Diagnosis not present

## 2021-07-15 DIAGNOSIS — I739 Peripheral vascular disease, unspecified: Secondary | ICD-10-CM | POA: Diagnosis not present

## 2021-07-15 DIAGNOSIS — E785 Hyperlipidemia, unspecified: Secondary | ICD-10-CM | POA: Diagnosis not present

## 2021-07-15 DIAGNOSIS — F17210 Nicotine dependence, cigarettes, uncomplicated: Secondary | ICD-10-CM | POA: Diagnosis not present

## 2021-07-15 DIAGNOSIS — I10 Essential (primary) hypertension: Secondary | ICD-10-CM | POA: Diagnosis not present

## 2021-07-15 DIAGNOSIS — G4700 Insomnia, unspecified: Secondary | ICD-10-CM | POA: Diagnosis not present

## 2021-07-15 DIAGNOSIS — G459 Transient cerebral ischemic attack, unspecified: Secondary | ICD-10-CM | POA: Diagnosis not present

## 2021-07-15 DIAGNOSIS — E119 Type 2 diabetes mellitus without complications: Secondary | ICD-10-CM | POA: Diagnosis not present

## 2021-07-15 DIAGNOSIS — I771 Stricture of artery: Secondary | ICD-10-CM | POA: Diagnosis not present

## 2021-07-18 DIAGNOSIS — E7849 Other hyperlipidemia: Secondary | ICD-10-CM | POA: Diagnosis not present

## 2021-07-18 DIAGNOSIS — E119 Type 2 diabetes mellitus without complications: Secondary | ICD-10-CM | POA: Diagnosis not present

## 2021-07-18 DIAGNOSIS — E038 Other specified hypothyroidism: Secondary | ICD-10-CM | POA: Diagnosis not present

## 2021-07-18 DIAGNOSIS — D518 Other vitamin B12 deficiency anemias: Secondary | ICD-10-CM | POA: Diagnosis not present

## 2021-07-18 DIAGNOSIS — E559 Vitamin D deficiency, unspecified: Secondary | ICD-10-CM | POA: Diagnosis not present

## 2021-07-18 DIAGNOSIS — Z79899 Other long term (current) drug therapy: Secondary | ICD-10-CM | POA: Diagnosis not present

## 2021-07-25 DIAGNOSIS — N189 Chronic kidney disease, unspecified: Secondary | ICD-10-CM | POA: Diagnosis not present

## 2021-07-25 DIAGNOSIS — N179 Acute kidney failure, unspecified: Secondary | ICD-10-CM | POA: Diagnosis not present

## 2021-07-25 DIAGNOSIS — E119 Type 2 diabetes mellitus without complications: Secondary | ICD-10-CM | POA: Diagnosis not present

## 2021-09-04 ENCOUNTER — Telehealth: Payer: Self-pay

## 2021-09-04 NOTE — Telephone Encounter (Signed)
NOTES SCANNED TO REFERRAL 

## 2021-10-29 ENCOUNTER — Ambulatory Visit: Payer: Medicare Other | Admitting: Internal Medicine

## 2021-11-12 ENCOUNTER — Encounter: Payer: Self-pay | Admitting: Internal Medicine

## 2021-12-25 ENCOUNTER — Other Ambulatory Visit (HOSPITAL_COMMUNITY): Payer: Self-pay

## 2022-01-10 ENCOUNTER — Emergency Department (HOSPITAL_COMMUNITY): Payer: Medicare Other

## 2022-01-10 ENCOUNTER — Emergency Department (HOSPITAL_BASED_OUTPATIENT_CLINIC_OR_DEPARTMENT_OTHER)
Admit: 2022-01-10 | Discharge: 2022-01-10 | Disposition: A | Discharge status: Home / Self Care | Payer: Medicare Other | Attending: Emergency Medicine | Admitting: Emergency Medicine

## 2022-01-10 ENCOUNTER — Other Ambulatory Visit: Payer: Self-pay

## 2022-01-10 ENCOUNTER — Emergency Department (HOSPITAL_COMMUNITY)
Admission: EM | Admit: 2022-01-10 | Discharge: 2022-01-10 | Disposition: A | Discharge status: Home / Self Care | Payer: Medicare Other | Attending: Emergency Medicine | Admitting: Emergency Medicine

## 2022-01-10 ENCOUNTER — Encounter (HOSPITAL_COMMUNITY): Payer: Self-pay

## 2022-01-10 DIAGNOSIS — E119 Type 2 diabetes mellitus without complications: Secondary | ICD-10-CM | POA: Diagnosis not present

## 2022-01-10 DIAGNOSIS — E860 Dehydration: Secondary | ICD-10-CM | POA: Diagnosis not present

## 2022-01-10 DIAGNOSIS — Z7984 Long term (current) use of oral hypoglycemic drugs: Secondary | ICD-10-CM | POA: Diagnosis not present

## 2022-01-10 DIAGNOSIS — R410 Disorientation, unspecified: Secondary | ICD-10-CM | POA: Diagnosis not present

## 2022-01-10 DIAGNOSIS — R7402 Elevation of levels of lactic acid dehydrogenase (LDH): Secondary | ICD-10-CM | POA: Insufficient documentation

## 2022-01-10 DIAGNOSIS — R000 Tachycardia, unspecified: Secondary | ICD-10-CM | POA: Diagnosis not present

## 2022-01-10 DIAGNOSIS — R0682 Tachypnea, not elsewhere classified: Secondary | ICD-10-CM | POA: Insufficient documentation

## 2022-01-10 DIAGNOSIS — L03116 Cellulitis of left lower limb: Secondary | ICD-10-CM | POA: Diagnosis present

## 2022-01-10 DIAGNOSIS — Z7901 Long term (current) use of anticoagulants: Secondary | ICD-10-CM | POA: Insufficient documentation

## 2022-01-10 DIAGNOSIS — N179 Acute kidney failure, unspecified: Secondary | ICD-10-CM

## 2022-01-10 DIAGNOSIS — M7989 Other specified soft tissue disorders: Secondary | ICD-10-CM

## 2022-01-10 LAB — CBC WITH DIFFERENTIAL/PLATELET
Abs Immature Granulocytes: 0.02 10*3/uL (ref 0.00–0.07)
Basophils Absolute: 0 10*3/uL (ref 0.0–0.1)
Basophils Relative: 0 %
Eosinophils Absolute: 0.1 10*3/uL (ref 0.0–0.5)
Eosinophils Relative: 1 %
HCT: 34.3 % — ABNORMAL LOW (ref 39.0–52.0)
Hemoglobin: 10.5 g/dL — ABNORMAL LOW (ref 13.0–17.0)
Immature Granulocytes: 0 %
Lymphocytes Relative: 15 %
Lymphs Abs: 1.3 10*3/uL (ref 0.7–4.0)
MCH: 27.3 pg (ref 26.0–34.0)
MCHC: 30.6 g/dL (ref 30.0–36.0)
MCV: 89.3 fL (ref 80.0–100.0)
Monocytes Absolute: 0.6 10*3/uL (ref 0.1–1.0)
Monocytes Relative: 6 %
Neutro Abs: 6.9 10*3/uL (ref 1.7–7.7)
Neutrophils Relative %: 78 %
Platelets: 379 10*3/uL (ref 150–400)
RBC: 3.84 MIL/uL — ABNORMAL LOW (ref 4.22–5.81)
RDW: 15.8 % — ABNORMAL HIGH (ref 11.5–15.5)
WBC: 9 10*3/uL (ref 4.0–10.5)
nRBC: 0 % (ref 0.0–0.2)

## 2022-01-10 LAB — COMPREHENSIVE METABOLIC PANEL
ALT: 8 U/L (ref 0–44)
AST: 20 U/L (ref 15–41)
Albumin: 3.3 g/dL — ABNORMAL LOW (ref 3.5–5.0)
Alkaline Phosphatase: 79 U/L (ref 38–126)
Anion gap: 11 (ref 5–15)
BUN: 20 mg/dL (ref 8–23)
CO2: 20 mmol/L — ABNORMAL LOW (ref 22–32)
Calcium: 8.8 mg/dL — ABNORMAL LOW (ref 8.9–10.3)
Chloride: 102 mmol/L (ref 98–111)
Creatinine, Ser: 2.41 mg/dL — ABNORMAL HIGH (ref 0.61–1.24)
GFR, Estimated: 29 mL/min — ABNORMAL LOW (ref >60)
Glucose, Bld: 228 mg/dL — ABNORMAL HIGH (ref 70–99)
Potassium: 4 mmol/L (ref 3.5–5.1)
Sodium: 133 mmol/L — ABNORMAL LOW (ref 135–145)
Total Bilirubin: 0.3 mg/dL (ref 0.3–1.2)
Total Protein: 7.1 g/dL (ref 6.5–8.1)

## 2022-01-10 LAB — CBG MONITORING, ED: Glucose-Capillary: 213 mg/dL — ABNORMAL HIGH (ref 70–99)

## 2022-01-10 LAB — PROTIME-INR
INR: 1.2 (ref 0.8–1.2)
Prothrombin Time: 15.3 seconds — ABNORMAL HIGH (ref 11.4–15.2)

## 2022-01-10 LAB — LACTIC ACID, PLASMA
Lactic Acid, Venous: 3.7 mmol/L (ref 0.5–1.9)
Lactic Acid, Venous: 2.4 mmol/L (ref 0.5–1.9)

## 2022-01-10 MED ORDER — LACTATED RINGERS IV BOLUS
2000.0000 mL | Freq: Once | INTRAVENOUS | Status: AC
  Administered 2022-01-10: 2000 mL via INTRAVENOUS

## 2022-01-10 MED ORDER — MORPHINE SULFATE (PF) 4 MG/ML IV SOLN
6.0000 mg | Freq: Once | INTRAVENOUS | Status: AC
  Administered 2022-01-10: 6 mg via INTRAVENOUS
  Filled 2022-01-10: qty 2

## 2022-01-10 MED ORDER — DOXYCYCLINE HYCLATE 100 MG PO CAPS: 100.0000 mg | ORAL_CAPSULE | Freq: Two times a day (BID) | ORAL | 0 refills | Status: AC

## 2022-01-10 MED ORDER — SODIUM CHLORIDE 0.9 % IV BOLUS: 1000.0000 mL | Freq: Once | INTRAVENOUS | Status: DC

## 2022-01-10 MED ORDER — DOXYCYCLINE HYCLATE 100 MG PO TABS
100.0000 mg | ORAL_TABLET | Freq: Once | ORAL | Status: AC
  Administered 2022-01-10: 100 mg via ORAL
  Filled 2022-01-10: qty 1

## 2022-01-10 NOTE — ED Notes (Signed)
Contacted SW at this time. Reference pt getting transportation home. At this time per SW they were going to give him a taxi voucher. Pt was asked if we gave him a taxi voucher could he walk and per pt he would.

## 2022-01-10 NOTE — ED Triage Notes (Signed)
Pt BIB [facility] from home c/o lesions on bilateral legs for 2 months. Per family they thought he was confused and having trouble walking. Pt does admit to meth use 2 days ago and THC use today.

## 2022-01-10 NOTE — ED Provider Triage Note (Signed)
Emergency Medicine Provider Triage Evaluation Note  [name] , a 66 y.o. male  was evaluated in triage.  Pt complains of pain and lesions of bilateral lower extremities.  He states he has redness and sores on his lower legs that has been worsening over the past 2 months.  He is a diabetic and reports his sugars have been high also.  Brought by [facility] from home, per family, patient seemed more confused and had difficulty walking.  Denies fever, chills, nausea, vomiting, syncope, lightheadedness.   Review of Systems  Positive: See above Negative: See above  Physical Exam  BP 109/64 (BP Location: Left Arm)   Pulse (!) 110   Temp 97.9 F (36.6 C) (Oral)   Resp (!) 26   Ht 5\' 10"  (1.778 m)   Wt 99.8 kg   SpO2 94%   BMI 31.57 kg/m  Gen:   Awake, no distress , A&Ox3 Resp:  Normal effort  MSK:   Moves extremities without difficulty  Other:  Erythema and multiple lesions with bandages to bilateral lower extremities   Medical Decision Making  Medically screening exam initiated at 2:06 PM.  Appropriate orders placed.  [name] was informed that the remainder of the evaluation will be completed by another provider, this initial triage assessment does not replace that evaluation, and the importance of remaining in the ED until their evaluation is complete.     Peggyann Juba R, Melton Alar 01/10/22 1409

## 2022-01-10 NOTE — ED Notes (Signed)
Received verbal report from Derika V RN at this time 

## 2022-01-10 NOTE — ED Notes (Signed)
Per pt mother pt had a lady friend that could be called spoke with pt and was advised that it was his home number but he didn't know what it was. Attempted to call number in the chart and there was no answer

## 2022-01-10 NOTE — ED Notes (Signed)
Spoke with pt and advised him that he was up for d/c. Pt stated that his mother was here as a pt. Attempted to locate her as a pt and unable to do so.

## 2022-01-10 NOTE — ED Notes (Signed)
At pt bedside and advised pt that his mother was not here as a pt. Pt then stated that she was at a specific location. Exited the room and located mothers information on chart and contacted her. Was advised by his mother that she was in [place] and couldn't come get him

## 2022-01-10 NOTE — Care Management (Signed)
PCP added to AVS 

## 2022-01-10 NOTE — Discharge Instructions (Addendum)
You were seen in the emergency department for wounds on your legs.  While you are here, we performed a physical exam, check labs, got imaging.  These showed that you do not have a blood clot in your leg, and that your infection does not go down to the bone.  You do have signs of a skin infection called cellulitis.  We are send you home with a prescription for an antibiotic called doxycycline.  Please take this as prescribed for the next 7 days.  Do not stop taking until you have completed a full course, unless you have been instructed to do so by another doctor.  Please make an appointment to see your primary doctor in the next 1 week for repeat BMP to check your kidney function and for a wound check.  We have placed a referral for you to be seen by a new primary care doctor, so you should receive a phone call within the next 2 days to set that up.  Your kidney function was also slightly abnormal today, probably due to dehydration.  Please be sure to drink plenty of water at home to help your kidneys heal.  Please return to the emergency department if you have fevers, worsening redness around your wounds, wounds draining pus, new confusion, or if you have any other reason to think you need emergency care.  We hope they feel better soon.

## 2022-01-10 NOTE — ED Provider Notes (Addendum)
[facility] EMERGENCY DEPARTMENT Provider Note   CSN: [id number] Arrival date & time: 01/10/22  1352     History  Chief Complaint  Patient presents with   Altered Mental Status    [name] is a 66 year old male with a history of type 2 diabetes, depression, substance use who presents to the emergency department for lower extremity wounds.  The patient reports that he has sores all over both of his legs below the level of the knee which have been present for months.  He has not yet seen a doctor about these, and does not regularly follow with a PCP.  He denies any fevers or chills.  He reports that he does smoke crystal meth, last 2 days ago, and smokes weed.  He denies any IV drug use or alcohol.  He denies cough, shortness of breath, dysuria, urinary frequency, abdominal pain, nausea or vomiting, or headaches.  He reports some on and off confusion at baseline, but has no confusion currently.  The history is provided by the patient and medical records.  Altered Mental Status      Home Medications Prior to Admission medications   Medication Sig Start Date End Date Taking? Authorizing Provider  doxycycline (VIBRAMYCIN) 100 MG capsule Take 1 capsule (100 mg total) by mouth 2 (two) times daily for 7 days. 01/10/22 01/17/22 Yes Renard Matter, MD  apixaban (ELIQUIS) 5 MG TABS tablet TAKE 2 TABLETS (10 MG TOTAL) BY MOUTH TWO TIMES DAILY FOR 7 DAYS, THEN 1 TABLET (5 MG TOTAL) TWO TIMES DAILY FOR 24 DAYS. Patient taking differently: Take 5 mg by mouth 2 (two) times daily. 03/09/20 03/09/21  Raiford Noble Latif, DO  blood glucose meter kit and supplies KIT Dispense based on patient and insurance preference. Use up to four times daily as directed. (FOR ICD-9 250.00, 250.01). 03/09/20   Raiford Noble Latif, DO  carvedilol (COREG) 6.25 MG tablet TAKE 1 TABLET (6.25 MG TOTAL) BY MOUTH TWO TIMES DAILY WITH A MEAL. Patient taking differently: Take 6.25 mg by mouth 2  (two) times daily with a meal. 03/09/20 03/09/21  Sheikh, Georgina Quint Latif, DO  famotidine (PEPCID) 20 MG tablet TAKE 1 TABLET (20 MG TOTAL) BY MOUTH TWO TIMES DAILY. Patient taking differently: Take 20 mg by mouth 2 (two) times daily. 03/09/20 03/09/21  Raiford Noble Latif, DO  gabapentin (NEURONTIN) 800 MG tablet Take 800 mg by mouth in the morning, at noon, in the evening, and at bedtime.  04/24/19   [provider]  glipiZIDE (GLUCOTROL XL) 10 MG 24 hr tablet TAKE 1 TABLET (10 MG TOTAL) BY MOUTH DAILY. Patient taking differently: Take 10 mg by mouth daily. 03/09/20 03/09/21  Raiford Noble Latif, DO  metFORMIN (GLUCOPHAGE) 850 MG tablet TAKE 1 TABLET (850 MG TOTAL) BY MOUTH TWO TIMES DAILY. Patient taking differently: Take 850 mg by mouth 2 (two) times daily with a meal. 03/09/20 03/09/21  Sheikh, Georgina Quint Latif, DO  rosuvastatin (CRESTOR) 20 MG tablet Take 20 mg by mouth at bedtime. 04/24/19   [provider]      Allergies    Patient has no known allergies.    Review of Systems   Review of Systems   See HPI.   Physical Exam Updated Vital Signs BP (!) 160/87   Pulse 96   Temp 97.7 F (36.5 C) (Oral)   Resp 12   Ht _0  (1.778 m)   Wt 99.8 kg   SpO2 96%   BMI 31.57 kg/m  Physical Exam Vitals and nursing note reviewed.  Constitutional:      Appearance: He is not toxic-appearing.  HENT:     Head: Normocephalic and atraumatic.  Eyes:     Extraocular Movements: Extraocular movements intact.  Cardiovascular:     Rate and Rhythm: Regular rhythm. Tachycardia present.     Pulses: Normal pulses.     Heart sounds: Normal heart sounds.  Pulmonary:     Comments: Mild tachypnea.  No rhonchi.  Abdominal:     General: There is no distension.     Palpations: Abdomen is soft.     Tenderness: There is no abdominal tenderness. There is no guarding or rebound.     Comments: Large midline abdominal hernia, chronic per patient.  Reducible and nontender to palpation, no overlying skin  changes.  Musculoskeletal:     Cervical back: Normal range of motion.     Comments: Left foot erythema, edema.  Skin:    Comments: Scattered skin lesions as below on bilateral lower extremities.  Neurological:     General: No focal deficit present.     Mental Status: He is alert.     Comments: Oriented to person, month and year, place, and circumstance.                ED Results / Procedures / Treatments   Labs (all labs ordered are listed, but only abnormal results are displayed) Labs Reviewed  COMPREHENSIVE METABOLIC PANEL - Abnormal; Notable for the following components:      Result Value   Sodium 133 (*)    CO2 20 (*)    Glucose, Bld 228 (*)    Creatinine, Ser 2.41 (*)    Calcium 8.8 (*)    Albumin 3.3 (*)    GFR, Estimated 29 (*)    All other components within normal limits  CBC WITH DIFFERENTIAL/PLATELET - Abnormal; Notable for the following components:   RBC 3.84 (*)    Hemoglobin 10.5 (*)    HCT 34.3 (*)    RDW 15.8 (*)    All other components within normal limits  LACTIC ACID, PLASMA - Abnormal; Notable for the following components:   Lactic Acid, Venous 3.7 (*)    All other components within normal limits  PROTIME-INR - Abnormal; Notable for the following components:   Prothrombin Time 15.3 (*)    All other components within normal limits  LACTIC ACID, PLASMA - Abnormal; Notable for the following components:   Lactic Acid, Venous 2.4 (*)    All other components within normal limits  CBG MONITORING, ED - Abnormal; Notable for the following components:   Glucose-Capillary 213 (*)    All other components within normal limits  CULTURE, BLOOD (ROUTINE X 2)  CULTURE, BLOOD (ROUTINE X 2)  URINALYSIS, ROUTINE W REFLEX MICROSCOPIC  RAPID URINE DRUG SCREEN, HOSP PERFORMED    EKG EKG Interpretation  Date/Time:  Friday January 10 2022 14:03:19 EST Ventricular Rate:  107 PR Interval:  140 QRS Duration: 104 QT Interval:  334 QTC Calculation: 445 R  Axis:   65 Text Interpretation: Sinus tachycardia Possible Left atrial enlargement Abnormal ECG When compared with ECG of 13-Jun-2020 11:41, PREVIOUS ECG IS PRESENT No sig chnge from April 2022 ecg, T wave inversions seen on prior Confirmed by Octaviano Glow ([id number]) on 01/10/2022 4:06:36 PM  Radiology DG Tibia/Fibula Right  Result Date: 01/10/2022 CLINICAL DATA:  Pain.  Concern for osteomyelitis. EXAM: RIGHT TIBIA AND FIBULA - 2 VIEW; RIGHT FOOT - 2 VIEW  COMPARISON:  Right femur and right knee radiographs 07/14/2012 FINDINGS: Right tibia and fibula: Redemonstration of remote high-grade irregularity, areas of bone loss, sclerosis, and remote fusion of the distal femoral diaphysis and metaphysis. Note is made on prior studies of a fused fibular bone graft within the distal femur. A single screw again is seen within the medial femoral condyle and two screws are again seen within the tibial tubercle. No new cortical erosion is seen to indicate acute osteomyelitis. -- Right foot: Mild peripheral medial and lateral great toe metatarsophalangeal degenerative spurring without significant joint space narrowing. Mild-to-moderate dorsal talonavicular degenerative osteophytosis. Mild dorsal tarsometatarsal degenerative osteophytosis on lateral view. No acute fracture or dislocation. No soft tissue ulceration is seen. No cortical erosion. IMPRESSION: 1. No radiographic evidence of acute osteomyelitis. 2. Unchanged chronic high-grade irregularity, sclerosis, and fusion of the distal femoral diaphysis and metaphysis from remote injury. Electronically Signed   By: Yvonne Kendall M.D.   On: 01/10/2022 18:05   DG Foot 2 Views Right  Result Date: 01/10/2022 CLINICAL DATA:  Pain.  Concern for osteomyelitis. EXAM: RIGHT TIBIA AND FIBULA - 2 VIEW; RIGHT FOOT - 2 VIEW COMPARISON:  Right femur and right knee radiographs 07/14/2012 FINDINGS: Right tibia and fibula: Redemonstration of remote high-grade irregularity, areas of bone  loss, sclerosis, and remote fusion of the distal femoral diaphysis and metaphysis. Note is made on prior studies of a fused fibular bone graft within the distal femur. A single screw again is seen within the medial femoral condyle and two screws are again seen within the tibial tubercle. No new cortical erosion is seen to indicate acute osteomyelitis. -- Right foot: Mild peripheral medial and lateral great toe metatarsophalangeal degenerative spurring without significant joint space narrowing. Mild-to-moderate dorsal talonavicular degenerative osteophytosis. Mild dorsal tarsometatarsal degenerative osteophytosis on lateral view. No acute fracture or dislocation. No soft tissue ulceration is seen. No cortical erosion. IMPRESSION: 1. No radiographic evidence of acute osteomyelitis. 2. Unchanged chronic high-grade irregularity, sclerosis, and fusion of the distal femoral diaphysis and metaphysis from remote injury. Electronically Signed   By: Yvonne Kendall M.D.   On: 01/10/2022 18:05   DG Tibia/Fibula Left  Result Date: 01/10/2022 CLINICAL DATA:  Pain.  Concern for osteomyelitis. EXAM: LEFT FOOT - 2 VIEW; LEFT TIBIA AND FIBULA - 2 VIEW COMPARISON:  Left ankle and foot radiographs 04/23/2019 FINDINGS: Left tibia and fibula: Remote resection of the middle approximately 40% of the fibula. There are numerous surgical clips in this region. The knee and ankle joint spaces are appropriately aligned. Mild superior and mild inferior patellar degenerative osteophytosis. No knee joint effusion. Mild chronic enthesopathic change at the patellar insertion on the tibial tubercle. Mild distal anterior tibial plafond degenerative osteophytosis. Left foot: Minimal hallux valgus. Mild dorsal talonavicular degenerative osteophytes. No soft tissue ulceration is seen. No cortical erosion. No acute fracture or dislocation. IMPRESSION: 1. Remote resection of the middle approximately 40% of the fibula. 2. No radiographic evidence of  osteomyelitis. Electronically Signed   By: Yvonne Kendall M.D.   On: 01/10/2022 18:00   DG Foot 2 Views Left  Result Date: 01/10/2022 CLINICAL DATA:  Pain.  Concern for osteomyelitis. EXAM: LEFT FOOT - 2 VIEW; LEFT TIBIA AND FIBULA - 2 VIEW COMPARISON:  Left ankle and foot radiographs 04/23/2019 FINDINGS: Left tibia and fibula: Remote resection of the middle approximately 40% of the fibula. There are numerous surgical clips in this region. The knee and ankle joint spaces are appropriately aligned. Mild superior and mild inferior  patellar degenerative osteophytosis. No knee joint effusion. Mild chronic enthesopathic change at the patellar insertion on the tibial tubercle. Mild distal anterior tibial plafond degenerative osteophytosis. Left foot: Minimal hallux valgus. Mild dorsal talonavicular degenerative osteophytes. No soft tissue ulceration is seen. No cortical erosion. No acute fracture or dislocation. IMPRESSION: 1. Remote resection of the middle approximately 40% of the fibula. 2. No radiographic evidence of osteomyelitis. Electronically Signed   By: Yvonne Kendall M.D.   On: 01/10/2022 18:00   VAS [place] LOWER EXTREMITY VENOUS (DVT) (ONLY [facility] & [facility])  Result Date: 01/10/2022  Lower Venous DVT Study Patient Name:  [name]  Date of Exam:   01/10/2022 Medical Rec #: [id number]           Accession #:    [id number] Date of Birth: 07-18-1955          Patient Gender: M Patient Age:   65 years Exam Location[facility]  [facility] Procedure:      VAS [place] LOWER EXTREMITY VENOUS (DVT) Referring Phys: MATTHEW TRIFAN --------------------------------------------------------------------------------  Indications: Swelling.  Risk Factors: None identified. Limitations: Poor ultrasound/tissue interface, open wound and patient positioning, patient constant movement, patient pain tolerance. Comparison Study: No prior studies. Performing Technologist: Oliver Hum RVT  Examination Guidelines: A complete evaluation  includes B-mode imaging, spectral Doppler, color Doppler, and power Doppler as needed of all accessible portions of each vessel. Bilateral testing is considered an integral part of a complete examination. Limited examinations for reoccurring indications may be performed as noted. The reflux portion of the exam is performed with the patient in reverse Trendelenburg.  +-----+---------------+---------+-----------+----------+--------------+ RIGHTCompressibilityPhasicitySpontaneityPropertiesThrombus Aging +-----+---------------+---------+-----------+----------+--------------+ CFV  Full           Yes      Yes                                 +-----+---------------+---------+-----------+----------+--------------+   +---------+---------------+---------+-----------+----------+--------------+ LEFT     CompressibilityPhasicitySpontaneityPropertiesThrombus Aging +---------+---------------+---------+-----------+----------+--------------+ CFV      Full           Yes      Yes                                 +---------+---------------+---------+-----------+----------+--------------+ SFJ      Full                                                        +---------+---------------+---------+-----------+----------+--------------+ FV Prox  Full                                                        +---------+---------------+---------+-----------+----------+--------------+ FV Mid   Full                                                        +---------+---------------+---------+-----------+----------+--------------+ FV Distal  Yes      Yes                                 +---------+---------------+---------+-----------+----------+--------------+ PFV      Full                                                        +---------+---------------+---------+-----------+----------+--------------+ POP      Full           Yes      Yes                                  +---------+---------------+---------+-----------+----------+--------------+ PTV      Full                                                        +---------+---------------+---------+-----------+----------+--------------+ PERO     Full                                                        +---------+---------------+---------+-----------+----------+--------------+    Summary: RIGHT: - No evidence of common femoral vein obstruction.  LEFT: - There is no evidence of deep vein thrombosis in the lower extremity. However, portions of this examination were limited- see technologist comments above.  - No cystic structure found in the popliteal fossa.  *See table(s) above for measurements and observations.    Preliminary    DG Chest 2 View  Result Date: 01/10/2022 CLINICAL DATA:  Suspected Sepsis EXAM: CHEST - 2 VIEW COMPARISON:  March 09, 2020 FINDINGS: The cardiomediastinal silhouette is unchanged in contour.Status post median sternotomy. No pleural effusion. No pneumothorax. No acute pleuroparenchymal abnormality. Visualized abdomen is unremarkable. Multilevel degenerative changes of the thoracic spine. Advanced degenerative changes of the LEFT shoulder. IMPRESSION: No active cardiopulmonary disease. Electronically Signed   By: Valentino Saxon M.D.   On: 01/10/2022 14:55    Procedures Procedures    Medications Ordered in ED Medications  doxycycline (VIBRA-TABS) tablet 100 mg (has no administration in time range)  lactated ringers bolus 2,000 mL (2,000 mLs Intravenous New Bag/Given 01/10/22 1700)  morphine (PF) 4 MG/ML injection 6 mg (6 mg Intravenous Given 01/10/22 1708)    ED Course/ Medical Decision Making/ A&P Clinical Course as of 01/10/22 1850  Fri Jan 10, 2022  1613 66 yo male presenting to ED with chronic LE wounds for months, presenting with pain from wounds, waxing and waning confusion at home according to family.  CR 2.4, double from baseline checked 1 year ago.  WBC wnl.   Lactate 3.7. [MT]  3212 Patient reports to me that he hardly drinks any water, only soda and Dr. Malachi Bonds.  He also has chronic diarrhea.  This is likely the cause of this tachycardia, increased creatinine, as well as lactate level.  Which is as a dehydration.  He does not want to stay  in the hospital.  We will recheck his lactate after fluids are completed.  Already his heart rate is improved to 92, and is no longer tachypneic.  He does not otherwise meet sepsis or SIRS criteria.  I have a very low suspicion for osteomyelitis.  He is got multiple small ulcerations superficial wounds of his lower extremity, but the pain he is having is chronic, and likely a combination of these ulcerations and diabetic neuropathy. [MT]  1173 Patient's lactate shows significant improvement after fluids.  I again suspect the elevated lactate was related to dehydration.  Do not see evidence of osteomyelitis.  At this point we will plan for discharge home on doxycycline for 7 days to treat for potential cellulitis of the lower extremities.  I strongly encouraged the patient to continue drinking water, and to avoid methamphetamines, which can cause "confusion" per his own admission.  He verbalized understanding and agreement with the plan.   [MT]    Clinical Course User Index [MT] Wyvonnia Dusky, MD                           Medical Decision Making Problems Addressed: AKI (acute kidney injury) [facility]): complicated acute illness or injury Cellulitis of left lower extremity: acute illness or injury that poses a threat to life or bodily functions Dehydration: complicated acute illness or injury  Amount and/or Complexity of Data Reviewed External Data Reviewed: labs. Labs: ordered. Decision-making details documented in ED Course. Radiology: ordered and independent interpretation performed. Decision-making details documented in ED Course.  Risk Prescription drug management.   [name] is a 66 year old male with  a history of type 2 diabetes, depression, substance use who presents to the emergency department for lower extremity wounds that have been present for several months without fever.  On initial examination, patient is nontoxic-appearing but is tachypneic and tachycardic.  Blood pressure stable and patient afebrile.  Physical exam overall remarkable for erythema and edema of left foot and scattered ulcerations to bilateral lower extremities.  Concern for possible osteomyelitis though wounds are not particularly impressive on exam, left lower extremity DVT.  Given tachycardia and tachypnea, concern for possible sepsis and other sources of infection, however patient denies cough or shortness of breath to suggest pneumonia though will obtain chest x-ray to further evaluate; no headache or neurologic deficits on exam or confusion to suggest meningitis and neck without signs of meningismus; no abdominal pain, nausea or vomiting to suggest intra-abdominal infection; no areas of erythema or fluctuance overlying the spine or IVDU to suggest spinal epidural abscess; no dysuria or urinary frequency or suprapubic tenderness on exam to suggest UTI; no other obvious sources of infection on exam other than lower extremity wounds.  Lab work obtained from triage demonstrates WBC 9.0, hemoglobin 10.5.  CMP with glucose 228, anion gap within normal limits, no evidence of DKA.  Creatinine elevated at 2.41, which is significantly up from most recent prior of 1.13 one year ago, representing AKI.  Lactic acid elevated at 3.7.  Patient with multiple SIRS criteria including tachypnea, tachycardia, and elevated lactic acid and creatinine on triage labs, initially concerning for sepsis, however he is hemodynamically stable and afebrile.  Will initiate IV fluid repletion with 2 L LR and continue work-up to evaluate for source.  We will likely start antibiotics, plan to initiate if patient develops fever. Patient states that he hardly  drinks any water at all, only Dr. Malachi Bonds.  Suspect prerenal  etiology of his AKI and suspect that this contributed to his tachycardia and elevated lactic acid.  Patient without hemodynamic instability or fever to suggest high likelihood of sepsis as a cause of these.  Will obtain repeat lactic acid after IV fluids.  Repeat lactic acid after IV fluids 2.4 down from initial of 3.7.  X-rays obtained of bilateral lower extremities to evaluate for possible osteomyelitis.  On my independent review as well as review by radiology these demonstrate no radiographic evidence of osteomyelitis.  Left lower extremity ultrasound somewhat limited by patient motion, but did not demonstrate evidence of DVT.    Patient is motivated to go home, and has remained overall well-appearing, hemodynamically stable, afebrile in the emergency department.  Tachycardia has resolved after IV fluid hydration, and lactic acid has significantly decreased to just above normal range after ~1 L of fluids.  Suspect that patient's initial vital signs and laboratory abnormalities were secondary mostly to dehydration in the setting of poor water intake and not to sepsis.  Patient informed about his worsening renal function, which I suspect is prerenal in nature, and the importance of maintaining good fluid hydration with water at home.  As patient is highly motivated to go home and does not want to be admitted and ED course has shown improvement, feel that discharge home with outpatient treatment for cellulitis is reasonable at this time.  Patient given first dose of doxycycline in the emergency department and prescription for remainder of antibiotic course.  He was instructed to follow-up with his PCP for repeat wound check as well as repeat creatinine check.  Patient states that he is currently looking for a new PCP, therefore transitional care team consult was placed to assist with this.  Patient instructed on importance of cessation of  methamphetamines and marijuana. Strict return precautions given.  Patient discharged in stable condition.   Final Clinical Impression(s) / ED Diagnoses Final diagnoses:  Cellulitis of left lower extremity  Dehydration  AKI (acute kidney injury) ([place])    Rx / DC Orders ED Discharge Orders          Ordered    doxycycline (VIBRAMYCIN) 100 MG capsule  2 times daily        01/10/22 1849              Renard Matter, MD 01/10/22 1930    Wyvonnia Dusky, MD 01/10/22 2153

## 2022-01-10 NOTE — Progress Notes (Signed)
Left lower extremity venous duplex has been completed. Preliminary results can be found in CV Proc through chart review.  Results were given to Dr. Renaye Rakers.  01/10/22 4:44 PM Olen Cordial RVT

## 2022-01-10 NOTE — Progress Notes (Signed)
TOC CSW was consulted for transportation.  CSW assisted with a taxi voucher.  CSW will also provide resources for substance use due to pt admitting to substance use.  Jamicia Haaland Tarpley-Carter, MSW, LCSW-A Pronouns:  She/Her/Hers [facility] HealthTransitions of Care Clinical Social Worker Direct Number:  [phone] [email or web address]

## 2022-01-15 LAB — CULTURE, BLOOD (ROUTINE X 2)
Culture: NO GROWTH
Culture: NO GROWTH
Special Requests: ADEQUATE

## 2022-03-09 ENCOUNTER — Emergency Department (HOSPITAL_COMMUNITY): Payer: 59

## 2022-03-09 ENCOUNTER — Emergency Department (HOSPITAL_COMMUNITY)
Admission: EM | Admit: 2022-03-09 | Discharge: 2022-03-09 | Disposition: A | Discharge status: Home / Self Care | Payer: 59 | Attending: Emergency Medicine | Admitting: Emergency Medicine

## 2022-03-09 ENCOUNTER — Other Ambulatory Visit: Payer: Self-pay

## 2022-03-09 ENCOUNTER — Encounter (HOSPITAL_COMMUNITY): Payer: Self-pay

## 2022-03-09 DIAGNOSIS — R7309 Other abnormal glucose: Secondary | ICD-10-CM | POA: Diagnosis not present

## 2022-03-09 DIAGNOSIS — R443 Hallucinations, unspecified: Secondary | ICD-10-CM | POA: Diagnosis not present

## 2022-03-09 DIAGNOSIS — Z7901 Long term (current) use of anticoagulants: Secondary | ICD-10-CM | POA: Insufficient documentation

## 2022-03-09 DIAGNOSIS — R440 Auditory hallucinations: Secondary | ICD-10-CM | POA: Diagnosis not present

## 2022-03-09 DIAGNOSIS — L03119 Cellulitis of unspecified part of limb: Secondary | ICD-10-CM

## 2022-03-09 DIAGNOSIS — F329 Major depressive disorder, single episode, unspecified: Secondary | ICD-10-CM | POA: Diagnosis not present

## 2022-03-09 DIAGNOSIS — Z046 Encounter for general psychiatric examination, requested by authority: Secondary | ICD-10-CM | POA: Diagnosis present

## 2022-03-09 LAB — COMPREHENSIVE METABOLIC PANEL
ALT: 11 U/L (ref 0–44)
AST: 17 U/L (ref 15–41)
Albumin: 3.3 g/dL — ABNORMAL LOW (ref 3.5–5.0)
Alkaline Phosphatase: 105 U/L (ref 38–126)
Anion gap: 9 (ref 5–15)
BUN: 13 mg/dL (ref 8–23)
CO2: 27 mmol/L (ref 22–32)
Calcium: 9.1 mg/dL (ref 8.9–10.3)
Chloride: 98 mmol/L (ref 98–111)
Creatinine, Ser: 1.52 mg/dL — ABNORMAL HIGH (ref 0.61–1.24)
GFR, Estimated: 51 mL/min — ABNORMAL LOW (ref >60)
Glucose, Bld: 151 mg/dL — ABNORMAL HIGH (ref 70–99)
Potassium: 4 mmol/L (ref 3.5–5.1)
Sodium: 134 mmol/L — ABNORMAL LOW (ref 135–145)
Total Bilirubin: 0.5 mg/dL (ref 0.3–1.2)
Total Protein: 7.3 g/dL (ref 6.5–8.1)

## 2022-03-09 LAB — CBC
HCT: 37 % — ABNORMAL LOW (ref 39.0–52.0)
Hemoglobin: 11.5 g/dL — ABNORMAL LOW (ref 13.0–17.0)
MCH: 27 pg (ref 26.0–34.0)
MCHC: 31.1 g/dL (ref 30.0–36.0)
MCV: 86.9 fL (ref 80.0–100.0)
Platelets: 350 10*3/uL (ref 150–400)
RBC: 4.26 MIL/uL (ref 4.22–5.81)
RDW: 17.1 % — ABNORMAL HIGH (ref 11.5–15.5)
WBC: 8.2 10*3/uL (ref 4.0–10.5)
nRBC: 0 % (ref 0.0–0.2)

## 2022-03-09 LAB — CBG MONITORING, ED: Glucose-Capillary: 137 mg/dL — ABNORMAL HIGH (ref 70–99)

## 2022-03-09 MED ORDER — DOXYCYCLINE HYCLATE 100 MG PO CAPS: 100.0000 mg | ORAL_CAPSULE | Freq: Two times a day (BID) | ORAL | 0 refills | Status: DC

## 2022-03-09 MED ORDER — DOXYCYCLINE HYCLATE 100 MG PO TABS
100.0000 mg | ORAL_TABLET | Freq: Once | ORAL | Status: AC
  Administered 2022-03-09: 100 mg via ORAL
  Filled 2022-03-09: qty 1

## 2022-03-09 NOTE — ED Provider Notes (Signed)
[facility] EMERGENCY DEPARTMENT Provider Note   CSN: [id number] Arrival date & time: 03/09/22  7106     History  Chief Complaint  Patient presents with   Mental Health Problem   Wound Check    Nickolai Rinks is a 67 y.o. male.  HPI   Pleasant 67 year old male presents emergency department concern for auditory hallucinations.  Patient states over the past 7 to 8 months he has been having auditory hallucinations with increased paranoia.  No visual hallucinations. Patient states he never had anything like this before.  He states he auditory hallucinations mimic voices of people he knows.  He has no SI, HI.  He is not specific about the voices tell him to do.  Patient is from home, no history of schizophrenia.  No recent fever, fall, head injury or other acute symptoms.  He has chronic wounds on his lower extremities that are unchanged.  Home Medications Prior to Admission medications   Medication Sig Start Date End Date Taking? Authorizing Provider  apixaban (ELIQUIS) 5 MG TABS tablet TAKE 2 TABLETS (10 MG TOTAL) BY MOUTH TWO TIMES DAILY FOR 7 DAYS, THEN 1 TABLET (5 MG TOTAL) TWO TIMES DAILY FOR 24 DAYS. Patient taking differently: Take 5 mg by mouth 2 (two) times daily. 03/09/20 03/09/21  Raiford Noble Latif, DO  blood glucose meter kit and supplies KIT Dispense based on patient and insurance preference. Use up to four times daily as directed. (FOR ICD-9 250.00, 250.01). 03/09/20   Raiford Noble Latif, DO  carvedilol (COREG) 6.25 MG tablet TAKE 1 TABLET (6.25 MG TOTAL) BY MOUTH TWO TIMES DAILY WITH A MEAL. Patient taking differently: Take 6.25 mg by mouth 2 (two) times daily with a meal. 03/09/20 03/09/21  Sheikh, Georgina Quint Latif, DO  famotidine (PEPCID) 20 MG tablet TAKE 1 TABLET (20 MG TOTAL) BY MOUTH TWO TIMES DAILY. Patient taking differently: Take 20 mg by mouth 2 (two) times daily. 03/09/20 03/09/21  Raiford Noble Latif, DO  gabapentin (NEURONTIN) 800 MG tablet Take  800 mg by mouth in the morning, at noon, in the evening, and at bedtime.  04/24/19   [provider]  glipiZIDE (GLUCOTROL XL) 10 MG 24 hr tablet TAKE 1 TABLET (10 MG TOTAL) BY MOUTH DAILY. Patient taking differently: Take 10 mg by mouth daily. 03/09/20 03/09/21  Raiford Noble Latif, DO  metFORMIN (GLUCOPHAGE) 850 MG tablet TAKE 1 TABLET (850 MG TOTAL) BY MOUTH TWO TIMES DAILY. Patient taking differently: Take 850 mg by mouth 2 (two) times daily with a meal. 03/09/20 03/09/21  Sheikh, Georgina Quint Latif, DO  rosuvastatin (CRESTOR) 20 MG tablet Take 20 mg by mouth at bedtime. 04/24/19   [provider]      Allergies    Patient has no known allergies.    Review of Systems   Review of Systems  Constitutional:  Negative for fever.  Respiratory:  Negative for shortness of breath.   Cardiovascular:  Negative for chest pain.  Gastrointestinal:  Negative for abdominal pain.  Neurological:  Negative for headaches.  Psychiatric/Behavioral:  Positive for agitation, confusion and hallucinations. Negative for self-injury and suicidal ideas.     Physical Exam Updated Vital Signs BP 102/75 (BP Location: Left Arm)   Pulse (!) 114   Temp 99 F (37.2 C) (Oral)   Resp 16   Ht 5\' 10"  (1.778 m)   Wt 99.8 kg   SpO2 97%   BMI 31.57 kg/m  Physical Exam Vitals and nursing note reviewed.  Constitutional:      Appearance: Normal appearance.  HENT:     Head: Normocephalic.     Mouth/Throat:     Mouth: Mucous membranes are moist.  Cardiovascular:     Rate and Rhythm: Normal rate.  Pulmonary:     Effort: Pulmonary effort is normal. No respiratory distress.  Skin:    General: Skin is warm.  Neurological:     Mental Status: He is alert and oriented to person, place, and time. Mental status is at baseline.  Psychiatric:        Mood and Affect: Mood normal.        Behavior: Behavior normal.        Thought Content: Thought content normal.     ED Results / Procedures / Treatments    Labs (all labs ordered are listed, but only abnormal results are displayed) Labs Reviewed  COMPREHENSIVE METABOLIC PANEL - Abnormal; Notable for the following components:      Result Value   Sodium 134 (*)    Glucose, Bld 151 (*)    Creatinine, Ser 1.52 (*)    Albumin 3.3 (*)    GFR, Estimated 51 (*)    All other components within normal limits  CBC - Abnormal; Notable for the following components:   Hemoglobin 11.5 (*)    HCT 37.0 (*)    RDW 17.1 (*)    All other components within normal limits  CBG MONITORING, ED - Abnormal; Notable for the following components:   Glucose-Capillary 137 (*)    All other components within normal limits  URINALYSIS, ROUTINE W REFLEX MICROSCOPIC  RAPID URINE DRUG SCREEN, HOSP PERFORMED    EKG None  Radiology No results found.  Procedures Procedures    Medications Ordered in ED Medications - No data to display  ED Course/ Medical Decision Making/ A&P                             Medical Decision Making Amount and/or Complexity of Data Reviewed Labs: ordered. Radiology: ordered.   67 year old male presents emergency department with auditory hallucinations for the past 7 to 8 months.  He denies any SI/HI or self-harm.  Patient also has a complaint of chronic lower extremity wounds/pain which has been well-documented evaluated in the past.  No acute change in regards to this.  Blood work evaluation shows no acute abnormality.  Patient was evaluated by TTS, has been cleared from their standpoint for outpatient follow-up and he has been given information/resources in regards to that.  Patient is pending head CT, if this is unremarkable anticipate discharge home.  Patient signed out pending head CT.        Final Clinical Impression(s) / ED Diagnoses Final diagnoses:  None    Rx / DC Orders ED Discharge Orders     None         Lorelle Gibbs, DO 03/09/22 2136

## 2022-03-09 NOTE — ED Triage Notes (Addendum)
Coming from home increased paranoia, hallucinations.  No hx of schizo is diabetic roommates reports last couple months he has been hearing things.  Reports when he took his weekly injection for DM would stopping hearing things. CBG 268  BP 128/78 HR109 98% Denies SI does have Auditory hallucinations.  Hx of cocaine and THC.  Heavy smoker. Has sores to ankles/feet that are painful.

## 2022-03-09 NOTE — ED Notes (Signed)
Pt given a coke and a sandwich bag.

## 2022-03-09 NOTE — ED Notes (Signed)
Patient has returned from CT

## 2022-03-09 NOTE — ED Provider Notes (Signed)
  Physical Exam  BP 133/77   Pulse (!) 101   Temp 98.4 F (36.9 C) (Oral)   Resp 16   Ht 5\' 10"  (1.778 m)   Wt 99.8 kg   SpO2 98%   BMI 31.57 kg/m   Physical Exam Constitutional:      Comments: Disheveled and chronically ill appearing but nontoxic  Pulmonary:     Effort: Pulmonary effort is normal.     Comments: O2 sat 98 on RA Skin:    Comments: Erythema and warmth with irregular borders extending from the feet to the mid legs bilaterally with scattered old scabs no active drainage  Neurological:     Comments: No facial droop, moving all 4 extremities equally, sensation grossly intact, normal speech and language, no focal deficits     Procedures  Procedures  ED Course / MDM   Clinical Course as of 03/09/22 2340  Sun Mar 09, 2022  2204 89 M with new auditory hallucinations. Cleared by psychiatry provided resources. Cr 1.52 improved from baseline.  Glucose 151.  Hemoglobin 11.5 at baseline.  No other complaints.  CT head pending. [VB]  2327 CTH without acute abnormality.  Patient reassessed, he is complaining of pain and chronic neuropathy in his legs.  There is mild erythema bilateral feet and mid leg with associated scabs but no active purulent discharge.  He has used crystal meth in the past.  He has what appears to be mild cellulitis in the lower extremities however I do not think he is septic.  White blood cell count normal 8.2 patient afebrile and without hypotension.  Starting patient on doxycycline for the next 10 days.  He has been psychiatrically cleared.  He is neurologically intact.  He is safe for discharge at this time.  Return precautions discussed.  10 days doxycycline provided.  Advise close follow-up with [facility] and [facility] as needed for psychiatric complaints. [VB]    Clinical Course User Index [VB] Elgie Congo, MD   Medical Decision Making Amount and/or Complexity of Data Reviewed Labs: ordered. Radiology:  ordered.  Risk Prescription drug management.          Elgie Congo, MD 03/09/22 [phone]

## 2022-03-09 NOTE — Discharge Instructions (Addendum)
Take the antibiotics as prescribed for the infection of your legs.  Follow-up with [facility].  Take your medications for neuropathy as needed.  Come back if any severe worsening redness, pain, drainage, fevers.  Follow-up with [facility] regarding your paranoia and hallucinations.  Come back if any severe thoughts of suicidal ideation homicidal ideation or any other symptoms concerning to you.

## 2022-03-09 NOTE — Consult Note (Signed)
[facility] Face-to-Face Psychiatry Consult   Reason for Consult:  Auditory and visual Hallucination Referring Physician:  Rozelle Logan, DO   Patient Identification: [name] MRN:  [id number] Principal Diagnosis: MDD (major depressive disorder) Diagnosis:  Principal Problem:   MDD (major depressive disorder) Active Problems:   Hallucination   Total Time spent with patient: 30 minutes  Subjective:   [name] is a 67 y.o. male patient admitted with history of major depressive disorder, generalized anxiety disorder, opioid use disorder, benzodiazepine abuse, depressive disorder, who presented to the [facility] with a complaint of hallucination and paranoia.   HPI:  [name] is a 67 y.o. male patient who presented to the ED with a complaint of hallucination and paranoia.  Patient was seen face to face by this provider and chart reviewed.   On evaluation patient is alert and oriented x4, speech is clear and coherent. Patient's eye contact is good, mood is euthymic, affect is appropriate. Patient's thought process is goal directed and thought content is within normal limits. Patient denies SI HI, denies AVH, or paranoia. Patient denies having any hallucination at this time but reported having hallucination earlier before he presented to the hospital. There is no indication that the patient is responding to internal stimuli and no delusion noted.    During assessment, patient reported that for the past 6 months he has been hearing voices of people he knows and different people. Patient was unable to tell what he was hearing but says he can hear voices of people.  Patient reported using crystal meth, and ice and the last time he used was 2 days ago. Patient reported that he started hearing voices since he began using crystal meth 6 months ago.  Patient denies using alcohol.  Patient reported that he is on some medication that he supposed to be taking but he denies taking them  regularly. Patient says sometimes I will take my medication and sometimes I will forget about it. Patient denies having any psychiatrist or therapist. Patient reported living with a male friend who has been taking care of him. Patient denies having any weapon at home.   Patient describes his sleep and appetite as good.   Support, encouragement and reassurance provided about ongoing stressors and patient provided with opportunity for questions.      Patient does not meet inpatient psychiatry admission criteria and there is no evidence of imminent danger to self or others.     Patient will be given resources for outpatient substance abuse treatment and for medication management.   Past Psychiatric History: Major depressive disorder generalized anxiety disorder, opioid use disorder, benzodiazepine abuse, depressive disorder.  Risk to Self: No Risk to Others: No Prior Inpatient Therapy: No Prior Outpatient Therapy: No  Past Medical History:  Past Medical History:  Diagnosis Date   Depression    Diabetes mellitus without complication (HCC)    Hypertension    Stroke [facility])     Past Surgical History:  Procedure Laterality Date   ABDOMINAL AORTOGRAM W/LOWER EXTREMITY Bilateral 05/02/2019   Procedure: ABDOMINAL AORTOGRAM W/LOWER EXTREMITY;  Surgeon: Maeola Harman, MD;  Location: [facility] INVASIVE CV LAB;  Service: Cardiovascular;  Laterality: Bilateral;   CORONARY STENT INTERVENTION N/A 03/05/2020   Procedure: CORONARY STENT INTERVENTION;  Surgeon: Yvonne Kendall, MD;  Location: [facility] INVASIVE CV LAB;  Service: Cardiovascular;  Laterality: N/A;   LEFT HEART CATH AND CORS/GRAFTS ANGIOGRAPHY N/A 03/05/2020   Procedure: LEFT HEART CATH AND CORS/GRAFTS ANGIOGRAPHY;  Surgeon:  End, Harrell Gave, MD;  Location: [facility] CV LAB;  Service: Cardiovascular;  Laterality: N/A;   Family History: History reviewed. No pertinent family history. Family Psychiatric  History: See Note Social History:   Social History   Substance and Sexual Activity  Alcohol Use No     Social History   Substance and Sexual Activity  Drug Use Yes   Types: Marijuana, Cocaine    Social History   Socioeconomic History   Marital status: Divorced    Spouse name: Not on file   Number of children: Not on file   Years of education: Not on file   Highest education level: Not on file  Occupational History   Occupation: disable  Tobacco Use   Smoking status: Every Day    Packs/day: 1.00    Types: Cigarettes   Smokeless tobacco: Never  Substance and Sexual Activity   Alcohol use: No   Drug use: Yes    Types: Marijuana, Cocaine   Sexual activity: Not on file    Comment: week ago  Other Topics Concern   Not on file  Social History Narrative   Not on file   Social Determinants of Health   Financial Resource Strain: Not on file  Food Insecurity: Not on file  Transportation Needs: Not on file  Physical Activity: Not on file  Stress: Not on file  Social Connections: Not on file   Additional Social History:    Allergies:  No Known Allergies  Labs:  Results for orders placed or performed during the hospital encounter of 03/09/22 (from the past 48 hour(s))  Comprehensive metabolic panel     Status: Abnormal   Collection Time: 03/09/22  8:54 AM  Result Value Ref Range   Sodium 134 (L) 135 - 145 mmol/L   Potassium 4.0 3.5 - 5.1 mmol/L   Chloride 98 98 - 111 mmol/L   CO2 27 22 - 32 mmol/L   Glucose, Bld 151 (H) 70 - 99 mg/dL    Comment: Glucose reference range applies only to samples taken after fasting for at least 8 hours.   BUN 13 8 - 23 mg/dL   Creatinine, Ser 1.52 (H) 0.61 - 1.24 mg/dL   Calcium 9.1 8.9 - 10.3 mg/dL   Total Protein 7.3 6.5 - 8.1 g/dL   Albumin 3.3 (L) 3.5 - 5.0 g/dL   AST 17 15 - 41 U/L   ALT 11 0 - 44 U/L   Alkaline Phosphatase 105 38 - 126 U/L   Total Bilirubin 0.5 0.3 - 1.2 mg/dL   GFR, Estimated 51 (L) >60 mL/min    Comment: (NOTE) Calculated using the  CKD-EPI Creatinine Equation (2021)    Anion gap 9 5 - 15    Comment: Performed at [facility] [address] [place], [place] [address]  CBC     Status: Abnormal   Collection Time: 03/09/22  8:54 AM  Result Value Ref Range   WBC 8.2 4.0 - 10.5 K/uL   RBC 4.26 4.22 - 5.81 MIL/uL   Hemoglobin 11.5 (L) 13.0 - 17.0 g/dL   HCT 37.0 (L) 39.0 - 52.0 %   MCV 86.9 80.0 - 100.0 fL   MCH 27.0 26.0 - 34.0 pg   MCHC 31.1 30.0 - 36.0 g/dL   RDW 17.1 (H) 11.5 - 15.5 %   Platelets 350 150 - 400 K/uL   nRBC 0.0 0.0 - 0.2 %    Comment: Performed at [facility], [place]  [address], [place], [place] [address]  CBG monitoring, ED     Status: Abnormal   Collection Time: 03/09/22  8:56 AM  Result Value Ref Range   Glucose-Capillary 137 (H) 70 - 99 mg/dL    Comment: Glucose reference range applies only to samples taken after fasting for at least 8 hours.    No current facility-administered medications for this encounter.   Current Outpatient Medications  Medication Sig Dispense Refill   apixaban (ELIQUIS) 5 MG TABS tablet TAKE 2 TABLETS (10 MG TOTAL) BY MOUTH TWO TIMES DAILY FOR 7 DAYS, THEN 1 TABLET (5 MG TOTAL) TWO TIMES DAILY FOR 24 DAYS. (Patient taking differently: Take 5 mg by mouth 2 (two) times daily.) 74 tablet 0   blood glucose meter kit and supplies KIT Dispense based on patient and insurance preference. Use up to four times daily as directed. (FOR ICD-9 250.00, 250.01). 1 each 0   carvedilol (COREG) 6.25 MG tablet TAKE 1 TABLET (6.25 MG TOTAL) BY MOUTH TWO TIMES DAILY WITH A MEAL. (Patient taking differently: Take 6.25 mg by mouth 2 (two) times daily with a meal.) 60 tablet 0   famotidine (PEPCID) 20 MG tablet TAKE 1 TABLET (20 MG TOTAL) BY MOUTH TWO TIMES DAILY. (Patient taking differently: Take 20 mg by mouth 2 (two) times daily.) 60 tablet 0   gabapentin (NEURONTIN) 800 MG tablet Take 800 mg by mouth in the morning, at noon, in the evening, and at bedtime.      glipiZIDE  (GLUCOTROL XL) 10 MG 24 hr tablet TAKE 1 TABLET (10 MG TOTAL) BY MOUTH DAILY. (Patient taking differently: Take 10 mg by mouth daily.) 30 tablet 0   metFORMIN (GLUCOPHAGE) 850 MG tablet TAKE 1 TABLET (850 MG TOTAL) BY MOUTH TWO TIMES DAILY. (Patient taking differently: Take 850 mg by mouth 2 (two) times daily with a meal.) 60 tablet 0   rosuvastatin (CRESTOR) 20 MG tablet Take 20 mg by mouth at bedtime.      Musculoskeletal: Strength & Muscle Tone: within normal limits Gait & Station: normal Patient leans: N/A   Psychiatric Specialty Exam:  Presentation  General Appearance:  Appropriate for Environment  Eye Contact: Good  Speech: Normal Rate  Speech Volume: Normal  Handedness: Right   Mood and Affect  Mood: Euthymic  Affect: Appropriate   Thought Process  Thought Processes: Coherent  Descriptions of Associations:Intact  Orientation:Full (Time, Place and Person)  Thought Content:WDL  History of Schizophrenia/Schizoaffective disorder:No data recorded Duration of Psychotic Symptoms:No data recorded Hallucinations:Hallucinations: None  Ideas of Reference:None  Suicidal Thoughts:Suicidal Thoughts: No  Homicidal Thoughts:Homicidal Thoughts: No   Sensorium  Memory: Immediate Good; Recent Good; Remote Good  Judgment: Good  Insight: Good   Executive Functions  Concentration: Good  Attention Span: Good  Recall: Good  Fund of Knowledge: Good  Language: Good   Psychomotor Activity  Psychomotor Activity: Psychomotor Activity: Normal   Assets  Assets: Communication Skills; Resilience; Desire for Improvement; Housing   Sleep  Sleep: Sleep: Fair   Physical Exam: Physical Exam Constitutional:      Appearance: Normal appearance.  Eyes:     General:        Right eye: No discharge.        Left eye: No discharge.  Cardiovascular:     Rate and Rhythm: Normal rate.  Pulmonary:     Effort: No respiratory distress.     Breath  sounds: No wheezing.  Neurological:     Mental Status: He is alert.  Psychiatric:  Attention and Perception: Attention normal.        Mood and Affect: Mood normal.        Speech: Speech normal.        Behavior: Behavior normal. Behavior is cooperative.        Thought Content: Thought content does not include homicidal or suicidal ideation.        Judgment: Judgment normal.    Review of Systems  Constitutional:  Negative for fever.  Respiratory:  Negative for cough, shortness of breath and wheezing.   Cardiovascular:  Negative for chest pain.  Gastrointestinal:  Negative for abdominal pain, nausea and vomiting.  Neurological:  Negative for dizziness, speech change, seizures, weakness and headaches.  Psychiatric/Behavioral:  Positive for substance abuse. Negative for depression, hallucinations and suicidal ideas. The patient does not have insomnia.    Blood pressure 100/80, pulse 69, temperature 99 F (37.2 C), temperature source Oral, resp. rate 16, height 5\' 10"  (1.778 m), weight 99.8 kg, SpO2 98 %. Body mass index is 31.57 kg/m.  Treatment Plan Summary: Plan : Patient will be provided resources for outpatient substance abuse treatment and medication management.  Patient Will Be Discharged Home after medically cleared.   Disposition: No evidence of imminent risk to self or others at present.   Patient does not meet criteria for psychiatric inpatient admission. Supportive therapy provided about ongoing stressors. Discussed crisis plan, support from social network, calling 911, coming to the Emergency Department, and calling Suicide Hotline.  Discussed methods to reduce the risk of self-injury or suicide attempts: Frequent conversations regarding unsafe thoughts. Remove all significant sharps. Remove all firearms. Remove all medications, including over-the-counter meds. Consider lockbox for medications and having a responsible person dispense medications until patient has  strengthened coping skills. Room checks for sharps or other harmful objects. Secure all chemical substances that can be ingested or inhaled.   Please refrain from using alcohol or illicit substances, as they can affect your mood and can cause depression, anxiety or other concerning symptoms.  Alcohol can increase the chance that a person will make reckless decisions, like attempting suicide, and can increase the lethality of a drug overdose.      Discussed crisis plan, calling 911, or going to the ED if condition changes or worsens.  Patient verbalized her understanding.     Patient will be discharged home after medically cleared. Patient will be given resources for outpatient substance abuse treatment and medication management.    Earney Mallet, NP 03/09/2022 5:43 PM

## 2022-03-09 NOTE — ED Notes (Signed)
Patient transported to CT 

## 2022-03-10 NOTE — ED Notes (Signed)
Pt states his roommate is currently in ER as well and requested to wait in lobby until the roommate comes outside. Then they both will figure out a way to get back home. Pt placed in wheelchair with a blanket and all personal belongings in his lap. RN offered to help pt outside since he is asking to smoke, but pt declined at this time. Seated in lobby per pt request.

## 2022-03-28 ENCOUNTER — Other Ambulatory Visit: Payer: Self-pay

## 2022-03-28 DIAGNOSIS — I739 Peripheral vascular disease, unspecified: Secondary | ICD-10-CM

## 2022-04-02 ENCOUNTER — Ambulatory Visit (HOSPITAL_COMMUNITY): Admission: RE | Admit: 2022-04-02 | Payer: 59 | Source: Ambulatory Visit

## 2022-04-05 ENCOUNTER — Other Ambulatory Visit: Payer: Self-pay

## 2022-04-05 ENCOUNTER — Inpatient Hospital Stay (HOSPITAL_COMMUNITY)
Admission: EM | Admit: 2022-04-05 | Discharge: 2022-04-08 | DRG: 194 | Disposition: A | Discharge status: Home / Self Care | Payer: 59 | Attending: Internal Medicine | Admitting: Internal Medicine

## 2022-04-05 ENCOUNTER — Encounter (HOSPITAL_COMMUNITY): Payer: Self-pay

## 2022-04-05 DIAGNOSIS — I10 Essential (primary) hypertension: Secondary | ICD-10-CM | POA: Diagnosis present

## 2022-04-05 DIAGNOSIS — I714 Abdominal aortic aneurysm, without rupture, unspecified: Secondary | ICD-10-CM | POA: Diagnosis present

## 2022-04-05 DIAGNOSIS — R440 Auditory hallucinations: Secondary | ICD-10-CM | POA: Diagnosis present

## 2022-04-05 DIAGNOSIS — F1514 Other stimulant abuse with stimulant-induced mood disorder: Secondary | ICD-10-CM | POA: Diagnosis present

## 2022-04-05 DIAGNOSIS — T50916A Underdosing of multiple unspecified drugs, medicaments and biological substances, initial encounter: Secondary | ICD-10-CM | POA: Diagnosis present

## 2022-04-05 DIAGNOSIS — Z91199 Patient's noncompliance with other medical treatment and regimen due to unspecified reason: Secondary | ICD-10-CM

## 2022-04-05 DIAGNOSIS — Z8673 Personal history of transient ischemic attack (TIA), and cerebral infarction without residual deficits: Secondary | ICD-10-CM

## 2022-04-05 DIAGNOSIS — Z7901 Long term (current) use of anticoagulants: Secondary | ICD-10-CM

## 2022-04-05 DIAGNOSIS — Z79899 Other long term (current) drug therapy: Secondary | ICD-10-CM

## 2022-04-05 DIAGNOSIS — R4781 Slurred speech: Secondary | ICD-10-CM | POA: Diagnosis present

## 2022-04-05 DIAGNOSIS — F411 Generalized anxiety disorder: Secondary | ICD-10-CM | POA: Diagnosis present

## 2022-04-05 DIAGNOSIS — I2510 Atherosclerotic heart disease of native coronary artery without angina pectoris: Secondary | ICD-10-CM | POA: Diagnosis present

## 2022-04-05 DIAGNOSIS — F329 Major depressive disorder, single episode, unspecified: Secondary | ICD-10-CM | POA: Diagnosis present

## 2022-04-05 DIAGNOSIS — F1310 Sedative, hypnotic or anxiolytic abuse, uncomplicated: Secondary | ICD-10-CM | POA: Diagnosis present

## 2022-04-05 DIAGNOSIS — F1994 Other psychoactive substance use, unspecified with psychoactive substance-induced mood disorder: Secondary | ICD-10-CM

## 2022-04-05 DIAGNOSIS — E119 Type 2 diabetes mellitus without complications: Secondary | ICD-10-CM

## 2022-04-05 DIAGNOSIS — Z955 Presence of coronary angioplasty implant and graft: Secondary | ICD-10-CM

## 2022-04-05 DIAGNOSIS — Z7984 Long term (current) use of oral hypoglycemic drugs: Secondary | ICD-10-CM

## 2022-04-05 DIAGNOSIS — R45851 Suicidal ideations: Principal | ICD-10-CM | POA: Diagnosis present

## 2022-04-05 DIAGNOSIS — J189 Pneumonia, unspecified organism: Secondary | ICD-10-CM | POA: Diagnosis not present

## 2022-04-05 DIAGNOSIS — J441 Chronic obstructive pulmonary disease with (acute) exacerbation: Secondary | ICD-10-CM | POA: Diagnosis present

## 2022-04-05 DIAGNOSIS — Z91138 Patient's unintentional underdosing of medication regimen for other reason: Secondary | ICD-10-CM

## 2022-04-05 DIAGNOSIS — I429 Cardiomyopathy, unspecified: Secondary | ICD-10-CM | POA: Diagnosis present

## 2022-04-05 DIAGNOSIS — F17210 Nicotine dependence, cigarettes, uncomplicated: Secondary | ICD-10-CM | POA: Diagnosis present

## 2022-04-05 DIAGNOSIS — R0902 Hypoxemia: Secondary | ICD-10-CM | POA: Diagnosis present

## 2022-04-05 DIAGNOSIS — F1110 Opioid abuse, uncomplicated: Secondary | ICD-10-CM | POA: Diagnosis present

## 2022-04-05 DIAGNOSIS — F319 Bipolar disorder, unspecified: Secondary | ICD-10-CM | POA: Diagnosis present

## 2022-04-05 DIAGNOSIS — Z86711 Personal history of pulmonary embolism: Secondary | ICD-10-CM

## 2022-04-05 DIAGNOSIS — R443 Hallucinations, unspecified: Secondary | ICD-10-CM | POA: Diagnosis present

## 2022-04-05 DIAGNOSIS — Z1152 Encounter for screening for COVID-19: Secondary | ICD-10-CM

## 2022-04-05 DIAGNOSIS — E785 Hyperlipidemia, unspecified: Secondary | ICD-10-CM | POA: Diagnosis present

## 2022-04-05 DIAGNOSIS — Z951 Presence of aortocoronary bypass graft: Secondary | ICD-10-CM

## 2022-04-05 DIAGNOSIS — N179 Acute kidney failure, unspecified: Secondary | ICD-10-CM | POA: Diagnosis present

## 2022-04-05 DIAGNOSIS — K219 Gastro-esophageal reflux disease without esophagitis: Secondary | ICD-10-CM | POA: Diagnosis present

## 2022-04-05 DIAGNOSIS — K029 Dental caries, unspecified: Secondary | ICD-10-CM | POA: Diagnosis present

## 2022-04-05 DIAGNOSIS — E1165 Type 2 diabetes mellitus with hyperglycemia: Secondary | ICD-10-CM | POA: Diagnosis present

## 2022-04-05 LAB — COMPREHENSIVE METABOLIC PANEL
ALT: 8 U/L (ref 0–44)
AST: 14 U/L — ABNORMAL LOW (ref 15–41)
Albumin: 3.6 g/dL (ref 3.5–5.0)
Alkaline Phosphatase: 93 U/L (ref 38–126)
Anion gap: 10 (ref 5–15)
BUN: 20 mg/dL (ref 8–23)
CO2: 26 mmol/L (ref 22–32)
Calcium: 8.6 mg/dL — ABNORMAL LOW (ref 8.9–10.3)
Chloride: 100 mmol/L (ref 98–111)
Creatinine, Ser: 1.31 mg/dL — ABNORMAL HIGH (ref 0.61–1.24)
GFR, Estimated: >60 mL/min (ref >60)
Glucose, Bld: 76 mg/dL (ref 70–99)
Potassium: 4 mmol/L (ref 3.5–5.1)
Sodium: 136 mmol/L (ref 135–145)
Total Bilirubin: 0.4 mg/dL (ref 0.3–1.2)
Total Protein: 7.8 g/dL (ref 6.5–8.1)

## 2022-04-05 LAB — CBC WITH DIFFERENTIAL/PLATELET
Abs Immature Granulocytes: 0.01 10*3/uL (ref 0.00–0.07)
Basophils Absolute: 0.1 10*3/uL (ref 0.0–0.1)
Basophils Relative: 1 %
Eosinophils Absolute: 0.2 10*3/uL (ref 0.0–0.5)
Eosinophils Relative: 3 %
HCT: 35.2 % — ABNORMAL LOW (ref 39.0–52.0)
Hemoglobin: 10.9 g/dL — ABNORMAL LOW (ref 13.0–17.0)
Immature Granulocytes: 0 %
Lymphocytes Relative: 23 %
Lymphs Abs: 1.5 10*3/uL (ref 0.7–4.0)
MCH: 27.5 pg (ref 26.0–34.0)
MCHC: 31 g/dL (ref 30.0–36.0)
MCV: 88.9 fL (ref 80.0–100.0)
Monocytes Absolute: 0.6 10*3/uL (ref 0.1–1.0)
Monocytes Relative: 8 %
Neutro Abs: 4.4 10*3/uL (ref 1.7–7.7)
Neutrophils Relative %: 65 %
Platelets: 312 10*3/uL (ref 150–400)
RBC: 3.96 MIL/uL — ABNORMAL LOW (ref 4.22–5.81)
RDW: 17 % — ABNORMAL HIGH (ref 11.5–15.5)
WBC: 6.7 10*3/uL (ref 4.0–10.5)
nRBC: 0 % (ref 0.0–0.2)

## 2022-04-05 LAB — RESP PANEL BY RT-PCR (RSV, FLU A&B, COVID)  RVPGX2
Influenza A by PCR: NEGATIVE
Influenza B by PCR: NEGATIVE
Resp Syncytial Virus by PCR: NEGATIVE
SARS Coronavirus 2 by RT PCR: NEGATIVE

## 2022-04-05 LAB — ETHANOL: Alcohol, Ethyl (B): <10 mg/dL (ref <10)

## 2022-04-05 NOTE — ED Triage Notes (Signed)
Patient coming from home via EMS with c/o of SI. Auditory/visual hallucinations that he states are bothering him and he is tired of. Pt states he plans to shoot self with gun. Patient states he does have access to gun with no bullets. Patient also states that he has not been taking medications for 2 weeks. Hx substance abuse.

## 2022-04-05 NOTE — BH Assessment (Signed)
Per RN Titus Dubin, patient is too sleepy to participate in assessment.  Pt would probably not stay awake she reports.  TTS to see pt when he is able to be alert and oriented.

## 2022-04-05 NOTE — ED Provider Notes (Signed)
[name] EMERGENCY DEPARTMENT AT [facility] Provider Note   CSN: [id number] Arrival date & time: 04/05/22  1850     History  Chief Complaint  Patient presents with   Suicidal    [name] is a 67 y.o. male.  Patient is a 67 year old male with a history of substance use disorder, depression anxiety who presents with hallucinations and suicidal ideations.  He is having auditory hallucinations that people are trying to kill him.  He also is having thoughts of wanting to kill himself.  He has a plan to shoot himself with a gun.  He denies any recent illnesses or current medical complaints.  He does say that he smokes weed all the time.  He also has recently used methamphetamine he states.  He denies any alcohol use.       Home Medications Prior to Admission medications   Medication Sig Start Date End Date Taking? Authorizing Provider  apixaban (ELIQUIS) 5 MG TABS tablet TAKE 2 TABLETS (10 MG TOTAL) BY MOUTH TWO TIMES DAILY FOR 7 DAYS, THEN 1 TABLET (5 MG TOTAL) TWO TIMES DAILY FOR 24 DAYS. Patient taking differently: Take 5 mg by mouth 2 (two) times daily. 03/09/20 03/09/21  Raiford Noble Latif, DO  blood glucose meter kit and supplies KIT Dispense based on patient and insurance preference. Use up to four times daily as directed. (FOR ICD-9 250.00, 250.01). 03/09/20   Raiford Noble Latif, DO  carvedilol (COREG) 6.25 MG tablet TAKE 1 TABLET (6.25 MG TOTAL) BY MOUTH TWO TIMES DAILY WITH A MEAL. Patient taking differently: Take 6.25 mg by mouth 2 (two) times daily with a meal. 03/09/20 03/09/21  Raiford Noble Latif, DO  doxycycline (VIBRAMYCIN) 100 MG capsule Take 1 capsule (100 mg total) by mouth 2 (two) times daily. 03/09/22   Elgie Congo, MD  famotidine (PEPCID) 20 MG tablet TAKE 1 TABLET (20 MG TOTAL) BY MOUTH TWO TIMES DAILY. Patient taking differently: Take 20 mg by mouth 2 (two) times daily. 03/09/20 03/09/21  Raiford Noble Latif, DO  gabapentin (NEURONTIN)  800 MG tablet Take 800 mg by mouth in the morning, at noon, in the evening, and at bedtime.  04/24/19   [provider]  glipiZIDE (GLUCOTROL XL) 10 MG 24 hr tablet TAKE 1 TABLET (10 MG TOTAL) BY MOUTH DAILY. Patient taking differently: Take 10 mg by mouth daily. 03/09/20 03/09/21  Raiford Noble Latif, DO  metFORMIN (GLUCOPHAGE) 850 MG tablet TAKE 1 TABLET (850 MG TOTAL) BY MOUTH TWO TIMES DAILY. Patient taking differently: Take 850 mg by mouth 2 (two) times daily with a meal. 03/09/20 03/09/21  Sheikh, Georgina Quint Latif, DO  rosuvastatin (CRESTOR) 20 MG tablet Take 20 mg by mouth at bedtime. 04/24/19   [provider]      Allergies    Patient has no known allergies.    Review of Systems   Review of Systems  Constitutional:  Negative for chills, diaphoresis, fatigue and fever.  HENT:  Negative for congestion, rhinorrhea and sneezing.   Eyes: Negative.   Respiratory:  Negative for cough, chest tightness and shortness of breath.   Cardiovascular:  Negative for chest pain and leg swelling.  Gastrointestinal:  Negative for abdominal pain, blood in stool, diarrhea, nausea and vomiting.  Genitourinary:  Negative for difficulty urinating, flank pain, frequency and hematuria.  Musculoskeletal:  Negative for arthralgias and back pain.  Skin:  Negative for rash.  Neurological:  Negative for dizziness, speech difficulty, weakness, numbness and headaches.  Psychiatric/Behavioral:  Positive for hallucinations and suicidal ideas.     Physical Exam Updated Vital Signs BP (!) 141/86 (BP Location: Left Arm)   Pulse (!) 106   Temp 98.5 F (36.9 C) (Oral)   Resp 17   SpO2 93%  Physical Exam Constitutional:      Appearance: He is well-developed.  HENT:     Head: Normocephalic and atraumatic.  Eyes:     Pupils: Pupils are equal, round, and reactive to light.  Cardiovascular:     Rate and Rhythm: Normal rate and regular rhythm.     Heart sounds: Normal heart sounds.  Pulmonary:      Effort: Pulmonary effort is normal. No respiratory distress.     Breath sounds: Normal breath sounds. No wheezing or rales.  Chest:     Chest wall: No tenderness.  Abdominal:     General: Bowel sounds are normal.     Palpations: Abdomen is soft.     Tenderness: There is no abdominal tenderness. There is no guarding or rebound.     Comments: Moderate size nontender periumbilical hernia.  It is easily reducible  Musculoskeletal:        General: Normal range of motion.     Cervical back: Normal range of motion and neck supple.  Lymphadenopathy:     Cervical: No cervical adenopathy.  Skin:    General: Skin is warm and dry.     Findings: No rash.  Neurological:     Mental Status: He is alert and oriented to person, place, and time.     ED Results / Procedures / Treatments   Labs (all labs ordered are listed, but only abnormal results are displayed) Labs Reviewed  COMPREHENSIVE METABOLIC PANEL - Abnormal; Notable for the following components:      Result Value   Creatinine, Ser 1.31 (*)    Calcium 8.6 (*)    AST 14 (*)    All other components within normal limits  CBC WITH DIFFERENTIAL/PLATELET - Abnormal; Notable for the following components:   RBC 3.96 (*)    Hemoglobin 10.9 (*)    HCT 35.2 (*)    RDW 17.0 (*)    All other components within normal limits  RESP PANEL BY RT-PCR (RSV, FLU A&B, COVID)  RVPGX2  ETHANOL  RAPID URINE DRUG SCREEN, HOSP PERFORMED    EKG EKG Interpretation  Date/Time:  Saturday April 05 2022 19:58:57 EST Ventricular Rate:  91 PR Interval:  165 QRS Duration: 115 QT Interval:  386 QTC Calculation: 475 R Axis:   87 Text Interpretation: Sinus rhythm Probable left atrial enlargement LVH with IVCD and secondary repol abnrm Artifact in lead(s) III V3 V4 since last tracing no significant change Confirmed by Malvin Johns [phone]) on 04/05/2022 8:42:51 PM  Radiology No results found.  Procedures Procedures    Medications Ordered in  ED Medications - No data to display  ED Course/ Medical Decision Making/ A&P                             Medical Decision Making Amount and/or Complexity of Data Reviewed Labs: ordered.   Patient is a 67 year old male who presents with auditory hallucinations and suicidal ideations.  He does have a history of substance abuse but not alcohol use.  His labs are nonconcerning.  His creatinine is mildly elevated but based on chart review has improved from his prior labs.  He is medically cleared and awaiting  TTS evaluation.  {Final Clinical Impression(s) / ED Diagnoses Final diagnoses:  Suicidal ideation  Hallucinations    Rx / DC Orders ED Discharge Orders     None         Malvin Johns, MD 04/05/22 2148

## 2022-04-06 ENCOUNTER — Emergency Department (HOSPITAL_COMMUNITY): Payer: 59

## 2022-04-06 DIAGNOSIS — Z951 Presence of aortocoronary bypass graft: Secondary | ICD-10-CM | POA: Diagnosis not present

## 2022-04-06 DIAGNOSIS — R443 Hallucinations, unspecified: Secondary | ICD-10-CM | POA: Diagnosis not present

## 2022-04-06 DIAGNOSIS — J189 Pneumonia, unspecified organism: Secondary | ICD-10-CM | POA: Diagnosis present

## 2022-04-06 DIAGNOSIS — F1514 Other stimulant abuse with stimulant-induced mood disorder: Secondary | ICD-10-CM | POA: Diagnosis present

## 2022-04-06 DIAGNOSIS — I10 Essential (primary) hypertension: Secondary | ICD-10-CM | POA: Diagnosis present

## 2022-04-06 DIAGNOSIS — F319 Bipolar disorder, unspecified: Secondary | ICD-10-CM | POA: Diagnosis present

## 2022-04-06 DIAGNOSIS — I429 Cardiomyopathy, unspecified: Secondary | ICD-10-CM | POA: Diagnosis present

## 2022-04-06 DIAGNOSIS — N179 Acute kidney failure, unspecified: Secondary | ICD-10-CM | POA: Diagnosis present

## 2022-04-06 DIAGNOSIS — J441 Chronic obstructive pulmonary disease with (acute) exacerbation: Secondary | ICD-10-CM | POA: Diagnosis present

## 2022-04-06 DIAGNOSIS — R440 Auditory hallucinations: Secondary | ICD-10-CM | POA: Diagnosis present

## 2022-04-06 DIAGNOSIS — E785 Hyperlipidemia, unspecified: Secondary | ICD-10-CM | POA: Diagnosis present

## 2022-04-06 DIAGNOSIS — R45851 Suicidal ideations: Secondary | ICD-10-CM | POA: Diagnosis present

## 2022-04-06 DIAGNOSIS — E1165 Type 2 diabetes mellitus with hyperglycemia: Secondary | ICD-10-CM | POA: Diagnosis present

## 2022-04-06 DIAGNOSIS — F1310 Sedative, hypnotic or anxiolytic abuse, uncomplicated: Secondary | ICD-10-CM | POA: Diagnosis present

## 2022-04-06 DIAGNOSIS — I2510 Atherosclerotic heart disease of native coronary artery without angina pectoris: Secondary | ICD-10-CM | POA: Diagnosis present

## 2022-04-06 DIAGNOSIS — F1994 Other psychoactive substance use, unspecified with psychoactive substance-induced mood disorder: Secondary | ICD-10-CM | POA: Diagnosis not present

## 2022-04-06 DIAGNOSIS — Z7901 Long term (current) use of anticoagulants: Secondary | ICD-10-CM | POA: Diagnosis not present

## 2022-04-06 DIAGNOSIS — Z86711 Personal history of pulmonary embolism: Secondary | ICD-10-CM | POA: Diagnosis not present

## 2022-04-06 DIAGNOSIS — K029 Dental caries, unspecified: Secondary | ICD-10-CM | POA: Diagnosis present

## 2022-04-06 DIAGNOSIS — K219 Gastro-esophageal reflux disease without esophagitis: Secondary | ICD-10-CM | POA: Diagnosis present

## 2022-04-06 DIAGNOSIS — I714 Abdominal aortic aneurysm, without rupture, unspecified: Secondary | ICD-10-CM | POA: Diagnosis present

## 2022-04-06 DIAGNOSIS — Z1152 Encounter for screening for COVID-19: Secondary | ICD-10-CM | POA: Diagnosis not present

## 2022-04-06 DIAGNOSIS — F411 Generalized anxiety disorder: Secondary | ICD-10-CM | POA: Diagnosis present

## 2022-04-06 DIAGNOSIS — R0902 Hypoxemia: Secondary | ICD-10-CM | POA: Diagnosis present

## 2022-04-06 DIAGNOSIS — E119 Type 2 diabetes mellitus without complications: Secondary | ICD-10-CM | POA: Diagnosis not present

## 2022-04-06 DIAGNOSIS — F17210 Nicotine dependence, cigarettes, uncomplicated: Secondary | ICD-10-CM | POA: Diagnosis present

## 2022-04-06 DIAGNOSIS — F1110 Opioid abuse, uncomplicated: Secondary | ICD-10-CM | POA: Diagnosis present

## 2022-04-06 LAB — COMPREHENSIVE METABOLIC PANEL
ALT: 10 U/L (ref 0–44)
AST: 17 U/L (ref 15–41)
Albumin: 3.3 g/dL — ABNORMAL LOW (ref 3.5–5.0)
Alkaline Phosphatase: 92 U/L (ref 38–126)
Anion gap: 10 (ref 5–15)
BUN: 29 mg/dL — ABNORMAL HIGH (ref 8–23)
CO2: 25 mmol/L (ref 22–32)
Calcium: 8.6 mg/dL — ABNORMAL LOW (ref 8.9–10.3)
Chloride: 98 mmol/L (ref 98–111)
Creatinine, Ser: 2.12 mg/dL — ABNORMAL HIGH (ref 0.61–1.24)
GFR, Estimated: 34 mL/min — ABNORMAL LOW (ref >60)
Glucose, Bld: 146 mg/dL — ABNORMAL HIGH (ref 70–99)
Potassium: 4.9 mmol/L (ref 3.5–5.1)
Sodium: 133 mmol/L — ABNORMAL LOW (ref 135–145)
Total Bilirubin: 0.7 mg/dL (ref 0.3–1.2)
Total Protein: 8.1 g/dL (ref 6.5–8.1)

## 2022-04-06 LAB — CBC WITH DIFFERENTIAL/PLATELET
Abs Immature Granulocytes: 0.03 10*3/uL (ref 0.00–0.07)
Basophils Absolute: 0 10*3/uL (ref 0.0–0.1)
Basophils Relative: 0 %
Eosinophils Absolute: 0.1 10*3/uL (ref 0.0–0.5)
Eosinophils Relative: 1 %
HCT: 37.4 % — ABNORMAL LOW (ref 39.0–52.0)
Hemoglobin: 11.6 g/dL — ABNORMAL LOW (ref 13.0–17.0)
Immature Granulocytes: 0 %
Lymphocytes Relative: 11 %
Lymphs Abs: 1.3 10*3/uL (ref 0.7–4.0)
MCH: 28.1 pg (ref 26.0–34.0)
MCHC: 31 g/dL (ref 30.0–36.0)
MCV: 90.6 fL (ref 80.0–100.0)
Monocytes Absolute: 0.9 10*3/uL (ref 0.1–1.0)
Monocytes Relative: 7 %
Neutro Abs: 9.5 10*3/uL — ABNORMAL HIGH (ref 1.7–7.7)
Neutrophils Relative %: 81 %
Platelets: 309 10*3/uL (ref 150–400)
RBC: 4.13 MIL/uL — ABNORMAL LOW (ref 4.22–5.81)
RDW: 17 % — ABNORMAL HIGH (ref 11.5–15.5)
WBC: 11.8 10*3/uL — ABNORMAL HIGH (ref 4.0–10.5)
nRBC: 0 % (ref 0.0–0.2)

## 2022-04-06 LAB — RESP PANEL BY RT-PCR (RSV, FLU A&B, COVID)  RVPGX2
Influenza A by PCR: NEGATIVE
Influenza B by PCR: NEGATIVE
Resp Syncytial Virus by PCR: NEGATIVE
SARS Coronavirus 2 by RT PCR: NEGATIVE

## 2022-04-06 LAB — BRAIN NATRIURETIC PEPTIDE: B Natriuretic Peptide: 39 pg/mL (ref 0.0–100.0)

## 2022-04-06 LAB — TROPONIN I (HIGH SENSITIVITY): Troponin I (High Sensitivity): 13 ng/L (ref <18)

## 2022-04-06 MED ORDER — SODIUM CHLORIDE 0.9 % IV SOLN: 2.0000 g | INTRAVENOUS | Status: DC

## 2022-04-06 MED ORDER — SODIUM CHLORIDE 0.9 % IV BOLUS
500.0000 mL | Freq: Once | INTRAVENOUS | Status: AC
  Administered 2022-04-06: 500 mL via INTRAVENOUS

## 2022-04-06 MED ORDER — HEPARIN SODIUM (PORCINE) 5000 UNIT/ML IJ SOLN
5000.0000 [IU] | Freq: Three times a day (TID) | INTRAMUSCULAR | Status: DC
  Administered 2022-04-06: 5000 [IU] via SUBCUTANEOUS
  Filled 2022-04-06: qty 1

## 2022-04-06 MED ORDER — SODIUM CHLORIDE 0.9 % IV SOLN: 500.0000 mg | INTRAVENOUS | Status: DC

## 2022-04-06 MED ORDER — IPRATROPIUM-ALBUTEROL 0.5-2.5 (3) MG/3ML IN SOLN: 3.0000 mL | RESPIRATORY_TRACT | Status: DC | PRN

## 2022-04-06 MED ORDER — ACETAMINOPHEN 325 MG PO TABS: 650.0000 mg | ORAL_TABLET | ORAL | Status: DC | PRN

## 2022-04-06 MED ORDER — ZIPRASIDONE MESYLATE 20 MG IM SOLR: 20.0000 mg | INTRAMUSCULAR | Status: DC | PRN

## 2022-04-06 MED ORDER — SODIUM CHLORIDE 0.9 % IV SOLN
500.0000 mg | Freq: Once | INTRAVENOUS | Status: AC
  Administered 2022-04-06: 500 mg via INTRAVENOUS
  Filled 2022-04-06: qty 5

## 2022-04-06 MED ORDER — ONDANSETRON HCL 4 MG PO TABS: 4.0000 mg | ORAL_TABLET | Freq: Three times a day (TID) | ORAL | Status: DC | PRN

## 2022-04-06 MED ORDER — LORAZEPAM 1 MG PO TABS: 1.0000 mg | ORAL_TABLET | ORAL | Status: DC | PRN

## 2022-04-06 MED ORDER — RISPERIDONE 0.5 MG PO TBDP: 2.0000 mg | ORAL_TABLET | Freq: Three times a day (TID) | ORAL | Status: DC | PRN

## 2022-04-06 MED ORDER — SODIUM CHLORIDE 0.9 % IV SOLN
1.0000 g | Freq: Once | INTRAVENOUS | Status: AC
  Administered 2022-04-06: 1 g via INTRAVENOUS
  Filled 2022-04-06: qty 10

## 2022-04-06 MED ORDER — PREDNISONE 20 MG PO TABS
40.0000 mg | ORAL_TABLET | Freq: Every day | ORAL | Status: DC
  Administered 2022-04-06: 40 mg via ORAL
  Filled 2022-04-06 (×2): qty 2

## 2022-04-06 NOTE — ED Notes (Signed)
Sent a message to Dr. Regenia Skeeter about patient not being able to walk without assistance and stating that he is dizzy when we helped him to the bathroom.

## 2022-04-06 NOTE — ED Notes (Addendum)
Left a message for Case manager Colletta Maryland [phone] about getting this patient a taxi voucher because he uses a walker to ambulate and he doesn't have his walker with him. Offered him a bus pass and he states that his home [place] bout 2 blocks from the bus stop.

## 2022-04-06 NOTE — ED Provider Notes (Addendum)
Emergency Medicine Observation Re-evaluation Note  [name] is a 67 y.o. male, seen on rounds today.  Pt initially presented to the ED for complaints of Suicidal Currently, the patient is sleeping.  Nurses indicate that patient has been cooperative.  Physical Exam  BP (!) 153/91   Pulse (!) 107   Temp 99.7 F (37.6 C) (Oral)   Resp 16   SpO2 96%  Physical Exam General: No distress Cardiac: Tachycardia Lungs: No respiratory distress Psych: Currently resting  ED Course / MDM  EKG:EKG Interpretation  Date/Time:  Saturday April 05 2022 19:58:57 EST Ventricular Rate:  91 PR Interval:  165 QRS Duration: 115 QT Interval:  386 QTC Calculation: 475 R Axis:   87 Text Interpretation: Sinus rhythm Probable left atrial enlargement LVH with IVCD and secondary repol abnrm Artifact in lead(s) III V3 V4 since last tracing no significant change Confirmed by Malvin Johns [phone]) on 04/05/2022 8:42:51 PM  I have reviewed the labs performed to date as well as medications administered while in observation.  Recent changes in the last 24 hours include : No new changes.  I ordered patient's home meds. I ordered a meal. Patient was too sleepy for TTS to assess, awaiting their disposition.  Plan  Current plan is for holding for psychiatric evaluation.    Varney Biles, MD 04/06/22 1001  3:08 PM Patient is cleared by psychiatry service.  He is stable for discharge from medical perspective.    Varney Biles, MD 04/06/22 [phone]

## 2022-04-06 NOTE — Discharge Summary (Signed)
[facility] ED Discharge  04/06/2022 3:10 PM [name]  MRN:  [id number]  Principal Problem: Suicide ideation Discharge Diagnoses: Principal Problem:   Suicide ideation Active Problems:   MDD (major depressive disorder)  Clinical Impression:  Final diagnoses:  Suicidal ideation  Hallucinations    ED Assessment Time Calculation: Start Time: 0945 Stop Time: 1020 Total Time in Minutes (Assessment Completion): 35   Subjective: [name], 67 y.o., male patient seen face to face by this provider, consulted with Dr. Dwyane Dee; and chart reviewed on 04/06/22.  On evaluation [name] reports that her throat is hurting, and he is not doing very well.  Patient has a disheveled look, patient appears somewhat drowsy, but he is oriented x 3.  Patient denies SI/HI/VH, endorses in auditory hallucinations, denies currently having any but he states when he uses illicit substances he hears voices telling him what to do.  Patient says they do not tell him to hurt people or himself.  Patient states he has a place to live, he lives by himself and he wants to go home.  Patient says sometimes he has friends over, when he feels like he needs people to talk to.  Patient says his appetite and sleep are good, UDS was positive for amphetamines and benzodiazepines, and THC patient says he uses those occasionally, and when he uses the substances, he hears voices that tell him what to do and then that he is having suicidal thoughts.  Patient denied having a gun at home, even though he told EPD he did, he said he had no access to gun, and right now does not want to hurt himself he does want to go home and get some rest.  Patient said he has been admitted to a psychiatric inpatient facility before, feels like he does not needed and he cannot recall the names of his medications, says he takes the medications when he feels like it. Patient does not appear to be in any acute distress.  Patient is cooperative,  his mood is good with congruent affect.  His speech is slurred due to drowsiness, appropriate behavior. Objectively there is no evidence of psychosis/mania or delusional thinking.  Patient is able to converse coherently, goal directed thoughts, no distractibility, or pre-occupation.  He denies suicidal/self-harm/homicidal ideation, psychosis, and paranoia currently.  Says he only hears voices and feels suicidal when he uses illicit substances, and it depends on how much he uses.  Patient's labs look pretty good, RBCs, hemoglobin, HCT are low from his last visit.  Patient has more complaints medically and psychiatrically.   At this time [name] is educated and verbalizes understanding of mental health resources and other crisis services in the community.  He is instructed to call 911 and present to the nearest emergency room should he experience any suicidal/homicidal ideation, auditory/visual/hallucinations, or detrimental worsening of his mental health condition.   Past Psychiatric History: Major depressive disorder generalized anxiety disorder, opioid use disorder, benzodiazepine abuse, depressive disorder.   Past Medical History:  Past Medical History:  Diagnosis Date   Depression    Diabetes mellitus without complication ([place])    Hypertension    Stroke [facility])     Past Surgical History:  Procedure Laterality Date   ABDOMINAL AORTOGRAM W/LOWER EXTREMITY Bilateral 05/02/2019   Procedure: ABDOMINAL AORTOGRAM W/LOWER EXTREMITY;  Surgeon: Waynetta Sandy, MD;  Location: [place] [facility];  Service: Cardiovascular;  Laterality: Bilateral;   CORONARY STENT INTERVENTION N/A 03/05/2020   Procedure:  CORONARY STENT INTERVENTION;  Surgeon: Nelva Bush, MD;  Location: [place] [facility];  Service: Cardiovascular;  Laterality: N/A;   LEFT HEART CATH AND CORS/GRAFTS ANGIOGRAPHY N/A 03/05/2020   Procedure: LEFT HEART CATH AND CORS/GRAFTS ANGIOGRAPHY;  Surgeon: Nelva Bush, MD;   Location: [place] [facility];  Service: Cardiovascular;  Laterality: N/A;   Family History: History reviewed. No pertinent family history.   Social History:  Social History   Substance and Sexual Activity  Alcohol Use Yes     Social History   Substance and Sexual Activity  Drug Use Yes   Types: Marijuana, Cocaine    Social History   Socioeconomic History   Marital status: Divorced    Spouse name: Not on file   Number of children: Not on file   Years of education: Not on file   Highest education level: Not on file  Occupational History   Occupation: disable  Tobacco Use   Smoking status: Every Day    Packs/day: 1.00    Types: Cigarettes   Smokeless tobacco: Never  Substance and Sexual Activity   Alcohol use: Yes   Drug use: Yes    Types: Marijuana, Cocaine   Sexual activity: Not on file    Comment: week ago  Other Topics Concern   Not on file  Social History Narrative   Not on file   Social Determinants of Health   Financial Resource Strain: Not on file  Food Insecurity: Not on file  Transportation Needs: Not on file  Physical Activity: Not on file  Stress: Not on file  Social Connections: Not on file    Tobacco Cessation:  A prescription for an FDA-approved tobacco cessation medication was offered at discharge and the patient refused  Current Medications: Current Facility-Administered Medications  Medication Dose Route Frequency Provider Last Rate Last Admin   acetaminophen (TYLENOL) tablet 650 mg  650 mg Oral Q4H PRN Kathrynn Humble, Ankit, MD       risperiDONE (RISPERDAL M-TABS) disintegrating tablet 2 mg  2 mg Oral Q8H PRN Kathrynn Humble, Ankit, MD       And   LORazepam (ATIVAN) tablet 1 mg  1 mg Oral PRN Kathrynn Humble, Ankit, MD       And   ziprasidone (GEODON) injection 20 mg  20 mg Intramuscular PRN Kathrynn Humble, Ankit, MD       ondansetron (ZOFRAN) tablet 4 mg  4 mg Oral Q8H PRN Varney Biles, MD       Current Outpatient Medications  Medication Sig Dispense  Refill   apixaban (ELIQUIS) 5 MG TABS tablet TAKE 2 TABLETS (10 MG TOTAL) BY MOUTH TWO TIMES DAILY FOR 7 DAYS, THEN 1 TABLET (5 MG TOTAL) TWO TIMES DAILY FOR 24 DAYS. (Patient taking differently: Take 5 mg by mouth 2 (two) times daily.) 74 tablet 0   blood glucose meter kit and supplies KIT Dispense based on patient and insurance preference. Use up to four times daily as directed. (FOR ICD-9 250.00, 250.01). 1 each 0   carvedilol (COREG) 6.25 MG tablet TAKE 1 TABLET (6.25 MG TOTAL) BY MOUTH TWO TIMES DAILY WITH A MEAL. (Patient taking differently: Take 6.25 mg by mouth 2 (two) times daily with a meal.) 60 tablet 0   doxycycline (VIBRAMYCIN) 100 MG capsule Take 1 capsule (100 mg total) by mouth 2 (two) times daily. 20 capsule 0   famotidine (PEPCID) 20 MG tablet TAKE 1 TABLET (20 MG TOTAL) BY MOUTH TWO TIMES DAILY. (Patient taking differently: Take 20 mg by mouth 2 (  two) times daily.) 60 tablet 0   gabapentin (NEURONTIN) 800 MG tablet Take 800 mg by mouth in the morning, at noon, in the evening, and at bedtime.      glipiZIDE (GLUCOTROL XL) 10 MG 24 hr tablet TAKE 1 TABLET (10 MG TOTAL) BY MOUTH DAILY. (Patient taking differently: Take 10 mg by mouth daily.) 30 tablet 0   metFORMIN (GLUCOPHAGE) 850 MG tablet TAKE 1 TABLET (850 MG TOTAL) BY MOUTH TWO TIMES DAILY. (Patient taking differently: Take 850 mg by mouth 2 (two) times daily with a meal.) 60 tablet 0   rosuvastatin (CRESTOR) 20 MG tablet Take 20 mg by mouth at bedtime.     PTA Medications: (Not in a hospital admission)   [place]:  [place] ED from 04/05/2022 in [facility] Emergency Department at [facility] ED from 03/09/2022 in [facility] Emergency Department at [facility] ED from 01/10/2022 in [facility] Emergency Department at [place] Error: Q6 is Yes, you must answer 7 No Risk No Risk       Musculoskeletal: Strength & Muscle Tone: within normal limits Gait & Station:  normal Patient leans: N/A  Psychiatric Specialty Exam: Presentation  General Appearance:  Disheveled  Eye Contact: Poor  Speech: Garbled  Speech Volume: Normal  Handedness: Right   Mood and Affect  Mood: Euthymic (drowsy)  Affect: Flat; Appropriate   Thought Process  Thought Processes: Coherent  Descriptions of Associations:Intact  Orientation:Full (Time, Place and Person)  Thought Content:WDL  History of Schizophrenia/Schizoaffective disorder:No data recorded Duration of Psychotic Symptoms:No data recorded Hallucinations:Hallucinations: None  Ideas of Reference:None  Suicidal Thoughts:Suicidal Thoughts: No  Homicidal Thoughts:Homicidal Thoughts: No   Sensorium  Memory: Immediate Fair; Remote Fair  Judgment: Fair  Insight: Fair   Community education officer  Concentration: Fair  Attention Span: Fair  Recall: Good  Fund of Knowledge: Good  Language: Good   Psychomotor Activity  Psychomotor Activity: Psychomotor Activity: Normal   Assets  Assets: Communication Skills; Housing   Sleep  Sleep: Sleep: Fair    Physical Exam: Physical Exam Eyes:     Pupils: Pupils are equal, round, and reactive to light.  Cardiovascular:     Rate and Rhythm: Normal rate.  Neurological:     Mental Status: He is alert.  Psychiatric:        Mood and Affect: Mood normal.        Behavior: Behavior normal.        Thought Content: Thought content normal.        Judgment: Judgment normal.    Review of Systems  Constitutional: Negative.   HENT: Negative.    Respiratory: Negative.    Psychiatric/Behavioral:  Positive for substance abuse.    Blood pressure (!) 153/91, pulse (!) 107, temperature 99.7 F (37.6 C), temperature source Oral, resp. rate 16, SpO2 96 %. There is no height or weight on file to calculate BMI.   Demographic Factors:  Male, Caucasian, and Low socioeconomic status  Loss Factors: Decline in physical  health  Historical Factors: Impulsivity  Risk Reduction Factors:   Positive social support  Continued Clinical Symptoms:  Depression:   Impulsivity  Suicide Risk:  Mild:  Suicidal ideation of limited frequency, intensity, duration, and specificity.  There are no identifiable plans, no associated intent, mild dysphoria and related symptoms, good self-control (both objective and subjective assessment), few other risk factors, and identifiable protective factors, including available and accessible social support.   Follow-up Information  [facility] Follow up.   Specialty: Behavioral Health Why: As needed, If symptoms worsen Contact information: [place]                Medical Decision Making: Patient is psychiatrically cleared. At time of discharge, patient denies SI, HI, AVH and is able to contract for safety. He demonstrated no overt evidence of psychosis or mania. Prior to discharge, he verbalized that they understood warning signs, triggers, and symptoms of worsening mental health and how to access emergency mental health care if they felt it was needed. Patient was instructed to call 911 or return to the emergency room if they experienced any concerning symptoms after discharge. Patient voiced understanding and agreed to the above.    Disposition: Discharge  Michaele Offer, [place] 04/06/2022, 3:10 PM

## 2022-04-06 NOTE — Progress Notes (Signed)
Per Michaele Offer, PMHNP pt has been pysch cleared. This CSW will now remove pt from the [facility] shift report.TOC will assist with discharge needs.   Benjaman Kindler, MSW, LCSWA 04/06/2022 3:30 PM

## 2022-04-06 NOTE — ED Notes (Signed)
RN notified that pts o2 sat is 85%

## 2022-04-06 NOTE — ED Notes (Signed)
Pt changed into purple scrubs. Pt belongings placed in hospital bag. Pt bag labeled and placed in nurse's station cabinet 23-25.

## 2022-04-06 NOTE — ED Notes (Signed)
Walked patient for less than a minute and his saturations began dropping from 95 down to 89. His heart rate also went up to about 140 to 150

## 2022-04-06 NOTE — ED Provider Notes (Signed)
6:16 PM I was asked to see patient as he seemed very weak when staff was getting him up to walk.  He normally walks with a walker and was cleared by psychiatry to go home but required significant two-person assist and a walker according to the nurse.  Patient states he is feeling weaker than normal.  He does endorse a cough that started while he has been in the emergency department.  He has chronic dyspnea.  He denies chest pain or focal weakness but feels all over weak.  He has a lot of chronic extremity issues.  No evidence of obvious cellulitis on exam.  I do not hear any obvious lung abnormalities.  His O2 sat is documented as 85% but when I spoke to the nurse and we rechecked it it is in the 90s.  Will walk him with an O2 sat monitor and run some extra tests.  He has been dealing with hallucinations that someone is breaking into his house for 6-8 weeks according to him.  Will check head CT.  He does not appear to have any focal weakness on my exam.  8:04 PM Patient's workup shows RLL pneumonia. I have personally viewed/interpreted these images.  Has a new leukocytosis compared to yesterday as well as a new AKI.  Will give a bolus of fluids.  He does have a known history of CHF but I also think he is likely dehydrated.  No overt edema on exam.  Will give IV antibiotics, Rocephin and azithromycin.  He will need admission as he desaturated into the 80s after only barely starting to walk.  I suspect this is why he is weaker than normal.  8:36 PM Discussed with [facility], who will admit.    Sherwood Gambler, MD 04/06/22 2041

## 2022-04-06 NOTE — H&P (Signed)
PCP:   System, Provider Not In   Chief Complaint:  Hallucinations, hypoxia  HPI: This is a 67 year old male with past medical history of hallucinations, diabetes mellitus, hypertension, bipolar disease, history of CVA, dyslipidemia, hypertension, CAD.  He came to the ER 2/10 with complaints of suicidal ideation and hallucinations.  Per patient his hallucinations have been going on for months.  He has since been seen and evaluated by behavioral health and he has been cleared for discharge.  The staff is walking to the bathroom and walker, or this is his baseline.  It was noted the patient was very weak and needed maximal assist.  They checked his oxygenation and he was hypoxic to 88%.  He was placed on 2 L and is now satting 93% and ambulating.  He needs no oxygen when resting.  Imaging was positive for right lower lobe pneumonia.  Hospitalist asked to admit due to this new oxygen need.  Per patient he uses meth on a regular basis, he last used 2 days ago.  Patient currently on 2 L oxygen.    Review of Systems:  The patient denies anorexia, fever, weight loss,, vision loss, decreased hearing, hoarseness, chest pain, syncope,  peripheral edema, balance deficits, hemoptysis, abdominal pain, melena, hematochezia, severe indigestion/heartburn, hematuria, incontinence, genital sores, muscle weakness, suspicious skin lesions, transient blindness, difficulty walking, unusual weight change, abnormal bleeding, enlarged lymph nodes, angioedema, and breast masses. Positive: Hallucinations, suicidal ideation, depression, shortness of breath, weakness, wheeze  Past Medical History: Past Medical History:  Diagnosis Date   Depression    Diabetes mellitus without complication ([place])    Hypertension    Stroke [facility])    Past Surgical History:  Procedure Laterality Date   ABDOMINAL AORTOGRAM W/LOWER EXTREMITY Bilateral 05/02/2019   Procedure: ABDOMINAL AORTOGRAM W/LOWER EXTREMITY;  Surgeon: Waynetta Sandy, MD;  Location: [place] [facility];  Service: Cardiovascular;  Laterality: Bilateral;   CORONARY STENT INTERVENTION N/A 03/05/2020   Procedure: CORONARY STENT INTERVENTION;  Surgeon: Nelva Bush, MD;  Location: [place] [facility];  Service: Cardiovascular;  Laterality: N/A;   LEFT HEART CATH AND CORS/GRAFTS ANGIOGRAPHY N/A 03/05/2020   Procedure: LEFT HEART CATH AND CORS/GRAFTS ANGIOGRAPHY;  Surgeon: Nelva Bush, MD;  Location: [place] [facility];  Service: Cardiovascular;  Laterality: N/A;    Medications: Prior to Admission medications   Medication Sig Start Date End Date Taking? Authorizing Provider  apixaban (ELIQUIS) 5 MG TABS tablet TAKE 2 TABLETS (10 MG TOTAL) BY MOUTH TWO TIMES DAILY FOR 7 DAYS, THEN 1 TABLET (5 MG TOTAL) TWO TIMES DAILY FOR 24 DAYS. Patient taking differently: Take 5 mg by mouth 2 (two) times daily. 03/09/20 03/09/21  Raiford Noble Latif, DO  blood glucose meter kit and supplies KIT Dispense based on patient and insurance preference. Use up to four times daily as directed. (FOR ICD-9 250.00, 250.01). 03/09/20   Raiford Noble Latif, DO  carvedilol (COREG) 6.25 MG tablet TAKE 1 TABLET (6.25 MG TOTAL) BY MOUTH TWO TIMES DAILY WITH A MEAL. Patient taking differently: Take 6.25 mg by mouth 2 (two) times daily with a meal. 03/09/20 03/09/21  Raiford Noble Latif, DO  doxycycline (VIBRAMYCIN) 100 MG capsule Take 1 capsule (100 mg total) by mouth 2 (two) times daily. 03/09/22   Elgie Congo, MD  famotidine (PEPCID) 20 MG tablet TAKE 1 TABLET (20 MG TOTAL) BY MOUTH TWO TIMES DAILY. Patient taking differently: Take 20 mg by mouth 2 (two) times daily. 03/09/20 03/09/21  Kerney Elbe,  DO  gabapentin (NEURONTIN) 800 MG tablet Take 800 mg by mouth in the morning, at noon, in the evening, and at bedtime.  04/24/19   [provider]  glipiZIDE (GLUCOTROL XL) 10 MG 24 hr tablet TAKE 1 TABLET (10 MG TOTAL) BY MOUTH DAILY. Patient taking differently: Take 10  mg by mouth daily. 03/09/20 03/09/21  Raiford Noble Latif, DO  metFORMIN (GLUCOPHAGE) 850 MG tablet TAKE 1 TABLET (850 MG TOTAL) BY MOUTH TWO TIMES DAILY. Patient taking differently: Take 850 mg by mouth 2 (two) times daily with a meal. 03/09/20 03/09/21  Sheikh, Georgina Quint Latif, DO  rosuvastatin (CRESTOR) 20 MG tablet Take 20 mg by mouth at bedtime. 04/24/19   [provider]    Allergies:  No Known Allergies  Social History:  reports that he has been smoking cigarettes. He has been smoking an average of 1 pack per day. He has never used smokeless tobacco. He reports current alcohol use. He reports current drug use. Drugs: Marijuana and Cocaine.  Family History: History reviewed. No pertinent family history.  Physical Exam: Vitals:   04/06/22 0020 04/06/22 0642 04/06/22 1523 04/06/22 1820  BP: (!) 172/100 (!) 153/91 102/73 101/64  Pulse: 100 (!) 107 (!) 102 (!) 112  Resp: 18 16 16 18  $ Temp: 99.1 F (37.3 C) 99.7 F (37.6 C) 98.9 F (37.2 C)   TempSrc: Oral Oral Oral   SpO2: 92% 96% (!) 85% 95%    General:  Alert and oriented times three, well developed and nourished, no acute distress.  Chronically unwell appearing gentleman Eyes: PERRLA, pink conjunctiva, no scleral icterus ENT: Moist oral mucosa, neck supple, no thyromegaly Lungs: Mild wheeze, and rhonchi, more so on the left.  No use of accessory muscles Cardiovascular: regular rate and rhythm, no regurgitation, no gallops, no murmurs. No carotid bruits, no JVD Abdomen: soft, positive BS, non-tender, non-distended, no organomegaly, not an acute abdomen, large periumbilical hernia GU: not examined Neuro: CN II - XII grossly intact, sensation intact Musculoskeletal: strength 5/5 all extremities, no clubbing, cyanosis or edema Skin: no rash, no subcutaneous crepitation, no decubitus Psych: appropriate patient  Labs on Admission:  Recent Labs    04/05/22 2018 04/06/22 1917  NA 136 133*  K 4.0 4.9  CL 100 98  CO2 26 25   GLUCOSE 76 146*  BUN 20 29*  CREATININE 1.31* 2.12*  CALCIUM 8.6* 8.6*   Recent Labs    04/05/22 2018 04/06/22 1917  AST 14* 17  ALT 8 10  ALKPHOS 93 92  BILITOT 0.4 0.7  PROT 7.8 8.1  ALBUMIN 3.6 3.3*   Recent Labs    04/05/22 2018 04/06/22 1917  WBC 6.7 11.8*  NEUTROABS 4.4 9.5*  HGB 10.9* 11.6*  HCT 35.2* 37.4*  MCV 88.9 90.6  PLT 312 309     Micro Results: Recent Results (from the past 240 hour(s))  Resp panel by RT-PCR (RSV, Flu A&B, Covid) Anterior Nasal Swab     Status: None   Collection Time: 04/05/22  9:38 PM   Specimen: Anterior Nasal Swab  Result Value Ref Range Status   SARS Coronavirus 2 by RT PCR NEGATIVE NEGATIVE Final    Comment: (NOTE) SARS-CoV-2 target nucleic acids are NOT DETECTED.  The SARS-CoV-2 RNA is generally detectable in upper respiratory specimens during the acute phase of infection. The lowest concentration of SARS-CoV-2 viral copies this assay can detect is 138 copies/mL. A negative result does not preclude SARS-Cov-2 infection and should not be used as  the sole basis for treatment or other patient management decisions. A negative result may occur with  improper specimen collection/handling, submission of specimen other than nasopharyngeal swab, presence of viral mutation(s) within the areas targeted by this assay, and inadequate number of viral copies(<138 copies/mL). A negative result must be combined with clinical observations, patient history, and epidemiological information. The expected result is Negative.  Fact Sheet for Patients:  EntrepreneurPulse.com.au  Fact Sheet for Healthcare Providers:  IncredibleEmployment.be  This test is no t yet approved or cleared by the [place] FDA and  has been authorized for detection and/or diagnosis of SARS-CoV-2 by FDA under an Emergency Use Authorization (EUA). This EUA will remain  in effect (meaning this test can be used) for the duration  of the COVID-19 declaration under Section 564(b)(1) of the Act, 21 U.S.C.section 360bbb-3(b)(1), unless the authorization is terminated  or revoked sooner.       Influenza A by PCR NEGATIVE NEGATIVE Final   Influenza B by PCR NEGATIVE NEGATIVE Final    Comment: (NOTE) The Xpert Xpress SARS-CoV-2/FLU/RSV plus assay is intended as an aid in the diagnosis of influenza from Nasopharyngeal swab specimens and should not be used as a sole basis for treatment. Nasal washings and aspirates are unacceptable for Xpert Xpress SARS-CoV-2/FLU/RSV testing.  Fact Sheet for Patients: EntrepreneurPulse.com.au  Fact Sheet for Healthcare Providers: IncredibleEmployment.be  This test is not yet approved or cleared by the [place] FDA and has been authorized for detection and/or diagnosis of SARS-CoV-2 by FDA under an Emergency Use Authorization (EUA). This EUA will remain in effect (meaning this test can be used) for the duration of the COVID-19 declaration under Section 564(b)(1) of the Act, 21 U.S.C. section 360bbb-3(b)(1), unless the authorization is terminated or revoked.     Resp Syncytial Virus by PCR NEGATIVE NEGATIVE Final    Comment: (NOTE) Fact Sheet for Patients: EntrepreneurPulse.com.au  Fact Sheet for Healthcare Providers: IncredibleEmployment.be  This test is not yet approved or cleared by the [place] FDA and has been authorized for detection and/or diagnosis of SARS-CoV-2 by FDA under an Emergency Use Authorization (EUA). This EUA will remain in effect (meaning this test can be used) for the duration of the COVID-19 declaration under Section 564(b)(1) of the Act, 21 U.S.C. section 360bbb-3(b)(1), unless the authorization is terminated or revoked.  Performed at [facility], [place] [address], [place], [place] [address]   Resp panel by RT-PCR (RSV, Flu A&B, Covid) Anterior Nasal  Swab     Status: None   Collection Time: 04/06/22  7:25 PM   Specimen: Anterior Nasal Swab  Result Value Ref Range Status   SARS Coronavirus 2 by RT PCR NEGATIVE NEGATIVE Final    Comment: (NOTE) SARS-CoV-2 target nucleic acids are NOT DETECTED.  The SARS-CoV-2 RNA is generally detectable in upper respiratory specimens during the acute phase of infection. The lowest concentration of SARS-CoV-2 viral copies this assay can detect is 138 copies/mL. A negative result does not preclude SARS-Cov-2 infection and should not be used as the sole basis for treatment or other patient management decisions. A negative result may occur with  improper specimen collection/handling, submission of specimen other than nasopharyngeal swab, presence of viral mutation(s) within the areas targeted by this assay, and inadequate number of viral copies(<138 copies/mL). A negative result must be combined with clinical observations, patient history, and epidemiological information. The expected result is Negative.  Fact Sheet for Patients:  EntrepreneurPulse.com.au  Fact Sheet for Healthcare Providers:  IncredibleEmployment.be  This test is no t yet approved or cleared by the [place] and  has been authorized for detection and/or diagnosis of SARS-CoV-2 by FDA under an Emergency Use Authorization (EUA). This EUA will remain  in effect (meaning this test can be used) for the duration of the COVID-19 declaration under Section 564(b)(1) of the Act, 21 U.S.C.section 360bbb-3(b)(1), unless the authorization is terminated  or revoked sooner.       Influenza A by PCR NEGATIVE NEGATIVE Final   Influenza B by PCR NEGATIVE NEGATIVE Final    Comment: (NOTE) The Xpert Xpress SARS-CoV-2/FLU/RSV plus assay is intended as an aid in the diagnosis of influenza from Nasopharyngeal swab specimens and should not be used as a sole basis for treatment. Nasal washings and aspirates  are unacceptable for Xpert Xpress SARS-CoV-2/FLU/RSV testing.  Fact Sheet for Patients: EntrepreneurPulse.com.au  Fact Sheet for Healthcare Providers: IncredibleEmployment.be  This test is not yet approved or cleared by the [place] FDA and has been authorized for detection and/or diagnosis of SARS-CoV-2 by FDA under an Emergency Use Authorization (EUA). This EUA will remain in effect (meaning this test can be used) for the duration of the COVID-19 declaration under Section 564(b)(1) of the Act, 21 U.S.C. section 360bbb-3(b)(1), unless the authorization is terminated or revoked.     Resp Syncytial Virus by PCR NEGATIVE NEGATIVE Final    Comment: (NOTE) Fact Sheet for Patients: EntrepreneurPulse.com.au  Fact Sheet for Healthcare Providers: IncredibleEmployment.be  This test is not yet approved or cleared by the [place] FDA and has been authorized for detection and/or diagnosis of SARS-CoV-2 by FDA under an Emergency Use Authorization (EUA). This EUA will remain in effect (meaning this test can be used) for the duration of the COVID-19 declaration under Section 564(b)(1) of the Act, 21 U.S.C. section 360bbb-3(b)(1), unless the authorization is terminated or revoked.  Performed at [facility], [place] [address] [place], [place] [address]      Radiological Exams on Admission: DG Chest 2 View  Result Date: 04/06/2022 CLINICAL DATA:  Cough, dyspnea EXAM: CHEST - 2 VIEW COMPARISON:  01/10/2022 FINDINGS: Patchy airspace infiltrate is developed within the right lower lobe, possibly infectious in the appropriate clinical setting. The lungs are otherwise clear. No pneumothorax or pleural effusion. Coronary artery bypass grafting has been performed. Pulmonary vascularity is normal. No acute bone abnormality. IMPRESSION: 1. Right lower lobe infiltrate, possibly infectious in the  appropriate clinical setting. Electronically Signed   By: Fidela Salisbury M.D.   On: 04/06/2022 19:48   CT Head Wo Contrast  Result Date: 04/06/2022 CLINICAL DATA:  Mental status change EXAM: CT HEAD WITHOUT CONTRAST TECHNIQUE: Contiguous axial images were obtained from the base of the skull through the vertex without intravenous contrast. RADIATION DOSE REDUCTION: This exam was performed according to the departmental dose-optimization program which includes automated exposure control, adjustment of the mA and/or kV according to patient size and/or use of iterative reconstruction technique. COMPARISON:  Head CT 03/09/2022.  MRI brain 10/17/2013. FINDINGS: Brain: No evidence of acute infarction, hemorrhage, hydrocephalus, extra-axial collection or mass lesion/mass effect. Small chronic infarct in the right cerebellum is unchanged. Vascular: Atherosclerotic calcifications are present within the cavernous internal carotid arteries. Skull: Normal. Negative for fracture or focal lesion. Sinuses/Orbits: No acute finding. Other: None. IMPRESSION: 1. No acute intracranial abnormality. 2. Stable small chronic infarct in the right cerebellum. Electronically Signed   By: Ronney Asters M.D.   On: 04/06/2022 18:59    Assessment/Plan Present on  Admission:  Right lower lobe pneumonia -Pneumonia orderset initiated -Blood cultures collected -Rocephin and azithromycin initiated in ER, continued -Nebulizers as needed ordered   Mild COPD exacerbation -Start p.o. prednisone -Duonebs PRN -Oxygen if needed   Bipolar disorder  Hallucination -Seen and cleared by psychiatry -Patient states his meth use may be precipitating his hallucinations   Diabetes mellitus -Sliding scale insulin initiated.  Oral meds on hold   Hyperlipidemia -Crestor resumed   GERD -Pepcid resumed   Hypertension/ CAD sp CABG/cardiomyopathy -Stable, Coreg, Crestor resumed   History of pulm embolism -On Eliquis   Methamphetamine  abuse  Aortic root and abdominal aneurysm  Maison Kestenbaum 04/06/2022, 9:04 PM

## 2022-04-06 NOTE — Discharge Instructions (Signed)
Discharge recommendations:  Patient is to take medications as prescribed. Please see information for follow-up appointment with psychiatry and therapy. Please follow up with your primary care provider for all medical related needs.   Therapy: We recommend that patient participate in individual therapy to address mental health concerns.  Medications: The patient or guardian is to contact a medical professional and/or outpatient provider to address any new side effects that develop. The patient or guardian should update outpatient providers of any new medications and/or medication changes.   Atypical antipsychotics: If you are prescribed an atypical antipsychotic, it is recommended that your height, weight, BMI, blood pressure, fasting lipid panel, and fasting blood sugar be monitored by your outpatient providers.  Safety:  The patient should abstain from use of illicit substances/drugs and abuse of any medications. If symptoms worsen or do not continue to improve or if the patient becomes actively suicidal or homicidal then it is recommended that the patient return to the closest hospital emergency department, the [facility], or call 911 for further evaluation and treatment. National Suicide Prevention Lifeline 1-800-SUICIDE or 216-525-8024.  About 988 988 offers 24/7 access to trained crisis counselors who can help people experiencing mental health-related distress. People can call or text 988 or chat 988lifeline.org for themselves or if they are worried about a loved one who may need crisis support.  Crisis Mobile: [facility]:                     (907)364-6421 (for crisis response 24 hours a day) [place]:                                            [phone]    Additional Discharge Instructions   Please get your medications reviewed and adjusted by your Primary MD.  Please request your Primary MD to go over all Hospital  Tests and Procedure/Radiological results at the follow up, please get all Hospital records sent to your Prim MD by signing hospital release before you go home.  If you had Pneumonia of Lung problems at the Hospital: Please get a 2 view Chest X ray done in approximately 4 weeks after hospital discharge or sooner if instructed by your Primary MD.  If you have Congestive Heart Failure: Please call your Cardiologist or Primary MD anytime you have any of the following symptoms:  1) 3 pound weight gain in 24 hours or 5 pounds in 1 week  2) shortness of breath, with or without a dry hacking cough  3) swelling in the hands, feet or stomach  4) if you have to sleep on extra pillows at night in order to breathe  Follow cardiac low salt diet and 1.5 lit/day fluid restriction.  If you have diabetes Accuchecks 4 times/day, Once in AM empty stomach and then before each meal. Log in all results and show them to your primary doctor at your next visit. If any glucose reading is under 80 or above 300 call your primary MD immediately.  If you have Seizure/Convulsions/Epilepsy: Please do not drive, operate heavy machinery, participate in activities at heights or participate in high speed sports until you have seen by Primary MD or a Neurologist and advised to do so again. Per [facility] statutes, patients with seizures are not allowed to drive until they have been seizure-free for six months.  Use caution when using heavy equipment or power tools. Avoid working on ladders or at heights. Take showers instead of baths. Ensure the water temperature is not too high on the home water heater. Do not go swimming alone. Do not lock yourself in a room alone (i.e. bathroom). When caring for infants or small children, sit down when holding, feeding, or changing them to minimize risk of injury to the child in the event you have a seizure. Maintain good sleep hygiene. Avoid alcohol.   If you had Gastrointestinal  Bleeding: Please ask your Primary MD to check a complete blood count within one week of discharge or at your next visit. Your endoscopic/colonoscopic biopsies that are pending at the time of discharge, will also need to followed by your Primary MD.  Get Medicines reviewed and adjusted. Please take all your medications with you for your next visit with your Primary MD  Please request your Primary MD to go over all hospital tests and procedure/radiological results at the follow up, please ask your Primary MD to get all Hospital records sent to his/her office.  If you experience worsening of your admission symptoms, develop shortness of breath, life threatening emergency, suicidal or homicidal thoughts you must seek medical attention immediately by calling 911 or calling your MD immediately  if symptoms less severe.  You must read complete instructions/literature along with all the possible adverse reactions/side effects for all the Medicines you take and that have been prescribed to you. Take any new Medicines after you have completely understood and accpet all the possible adverse reactions/side effects.   Do not drive or operate heavy machinery when taking Pain medications.   Do not take more than prescribed Pain, Sleep and Anxiety Medications  Special Instructions: If you have smoked or chewed Tobacco  in the last 2 yrs please stop smoking, stop any regular Alcohol  and or any Recreational drug use.  Wear Seat belts while driving.  Please note You were cared for by a hospitalist during your hospital stay. If you have any questions about your discharge medications or the care you received while you were in the hospital after you are discharged, you can call the unit and asked to speak with the hospitalist on call if the hospitalist that took care of you is not available. Once you are discharged, your primary care physician will handle any further medical issues. Please note that NO REFILLS for  any discharge medications will be authorized once you are discharged, as it is imperative that you return to your primary care physician (or establish a relationship with a primary care physician if you do not have one) for your aftercare needs so that they can reassess your need for medications and monitor your lab values.  You can reach the hospitalist office at phone [phone] or fax [phone]   If you do not have a primary care physician, you can call [phone] for a physician referral.

## 2022-04-07 ENCOUNTER — Encounter (HOSPITAL_COMMUNITY): Payer: Self-pay | Admitting: Family Medicine

## 2022-04-07 DIAGNOSIS — I10 Essential (primary) hypertension: Secondary | ICD-10-CM | POA: Diagnosis not present

## 2022-04-07 DIAGNOSIS — R45851 Suicidal ideations: Secondary | ICD-10-CM

## 2022-04-07 DIAGNOSIS — E119 Type 2 diabetes mellitus without complications: Secondary | ICD-10-CM | POA: Diagnosis not present

## 2022-04-07 DIAGNOSIS — F1994 Other psychoactive substance use, unspecified with psychoactive substance-induced mood disorder: Secondary | ICD-10-CM

## 2022-04-07 DIAGNOSIS — F319 Bipolar disorder, unspecified: Secondary | ICD-10-CM

## 2022-04-07 DIAGNOSIS — J189 Pneumonia, unspecified organism: Secondary | ICD-10-CM | POA: Diagnosis not present

## 2022-04-07 DIAGNOSIS — R443 Hallucinations, unspecified: Secondary | ICD-10-CM | POA: Diagnosis not present

## 2022-04-07 LAB — BASIC METABOLIC PANEL
Anion gap: 9 (ref 5–15)
BUN: 31 mg/dL — ABNORMAL HIGH (ref 8–23)
CO2: 25 mmol/L (ref 22–32)
Calcium: 8.4 mg/dL — ABNORMAL LOW (ref 8.9–10.3)
Chloride: 100 mmol/L (ref 98–111)
Creatinine, Ser: 1.5 mg/dL — ABNORMAL HIGH (ref 0.61–1.24)
GFR, Estimated: 52 mL/min — ABNORMAL LOW (ref >60)
Glucose, Bld: 159 mg/dL — ABNORMAL HIGH (ref 70–99)
Potassium: 4.5 mmol/L (ref 3.5–5.1)
Sodium: 134 mmol/L — ABNORMAL LOW (ref 135–145)

## 2022-04-07 LAB — GLUCOSE, CAPILLARY
Glucose-Capillary: 156 mg/dL — ABNORMAL HIGH (ref 70–99)
Glucose-Capillary: 168 mg/dL — ABNORMAL HIGH (ref 70–99)
Glucose-Capillary: 148 mg/dL — ABNORMAL HIGH (ref 70–99)
Glucose-Capillary: 183 mg/dL — ABNORMAL HIGH (ref 70–99)

## 2022-04-07 LAB — CBC WITH DIFFERENTIAL/PLATELET
Abs Immature Granulocytes: 0.03 10*3/uL (ref 0.00–0.07)
Basophils Absolute: 0 10*3/uL (ref 0.0–0.1)
Basophils Relative: 0 %
Eosinophils Absolute: 0 10*3/uL (ref 0.0–0.5)
Eosinophils Relative: 0 %
HCT: 32.3 % — ABNORMAL LOW (ref 39.0–52.0)
Hemoglobin: 9.9 g/dL — ABNORMAL LOW (ref 13.0–17.0)
Immature Granulocytes: 0 %
Lymphocytes Relative: 7 %
Lymphs Abs: 0.5 10*3/uL — ABNORMAL LOW (ref 0.7–4.0)
MCH: 27.7 pg (ref 26.0–34.0)
MCHC: 30.7 g/dL (ref 30.0–36.0)
MCV: 90.5 fL (ref 80.0–100.0)
Monocytes Absolute: 0.1 10*3/uL (ref 0.1–1.0)
Monocytes Relative: 2 %
Neutro Abs: 6.2 10*3/uL (ref 1.7–7.7)
Neutrophils Relative %: 91 %
Platelets: 264 10*3/uL (ref 150–400)
RBC: 3.57 MIL/uL — ABNORMAL LOW (ref 4.22–5.81)
RDW: 16.9 % — ABNORMAL HIGH (ref 11.5–15.5)
WBC: 6.8 10*3/uL (ref 4.0–10.5)
nRBC: 0 % (ref 0.0–0.2)

## 2022-04-07 LAB — CBC
HCT: 34.3 % — ABNORMAL LOW (ref 39.0–52.0)
Hemoglobin: 10.3 g/dL — ABNORMAL LOW (ref 13.0–17.0)
MCH: 27.9 pg (ref 26.0–34.0)
MCHC: 30 g/dL (ref 30.0–36.0)
MCV: 93 fL (ref 80.0–100.0)
Platelets: 279 10*3/uL (ref 150–400)
RBC: 3.69 MIL/uL — ABNORMAL LOW (ref 4.22–5.81)
RDW: 17.2 % — ABNORMAL HIGH (ref 11.5–15.5)
WBC: 8.4 10*3/uL (ref 4.0–10.5)
nRBC: 0 % (ref 0.0–0.2)

## 2022-04-07 LAB — CREATININE, SERUM
Creatinine, Ser: 1.8 mg/dL — ABNORMAL HIGH (ref 0.61–1.24)
GFR, Estimated: 42 mL/min — ABNORMAL LOW (ref >60)

## 2022-04-07 LAB — HEMOGLOBIN A1C
Hgb A1c MFr Bld: 6.3 % — ABNORMAL HIGH (ref 4.8–5.6)
Mean Plasma Glucose: 134.11 mg/dL

## 2022-04-07 LAB — CK: Total CK: 164 U/L (ref 49–397)

## 2022-04-07 LAB — LACTIC ACID, PLASMA: Lactic Acid, Venous: 1.3 mmol/L (ref 0.5–1.9)

## 2022-04-07 LAB — HIV ANTIBODY (ROUTINE TESTING W REFLEX): HIV Screen 4th Generation wRfx: NONREACTIVE

## 2022-04-07 MED ORDER — CARVEDILOL 6.25 MG PO TABS
6.2500 mg | ORAL_TABLET | Freq: Two times a day (BID) | ORAL | Status: DC
  Administered 2022-04-07 (×2): 6.25 mg via ORAL
  Filled 2022-04-07 (×3): qty 1

## 2022-04-07 MED ORDER — INSULIN ASPART 100 UNIT/ML IJ SOLN: 0.0000 [IU] | Freq: Every day | INTRAMUSCULAR | Status: DC

## 2022-04-07 MED ORDER — FAMOTIDINE 20 MG PO TABS
20.0000 mg | ORAL_TABLET | Freq: Two times a day (BID) | ORAL | Status: DC
  Administered 2022-04-07 (×2): 20 mg via ORAL
  Filled 2022-04-07 (×3): qty 1

## 2022-04-07 MED ORDER — ROSUVASTATIN CALCIUM 10 MG PO TABS
20.0000 mg | ORAL_TABLET | Freq: Every day | ORAL | Status: DC
  Administered 2022-04-07: 20 mg via ORAL
  Filled 2022-04-07: qty 2

## 2022-04-07 MED ORDER — SODIUM CHLORIDE 0.9 % IV SOLN
2.0000 g | INTRAVENOUS | Status: DC
  Administered 2022-04-07: 2 g via INTRAVENOUS
  Filled 2022-04-07: qty 20

## 2022-04-07 MED ORDER — INSULIN ASPART 100 UNIT/ML IJ SOLN
0.0000 [IU] | Freq: Three times a day (TID) | INTRAMUSCULAR | Status: DC
  Administered 2022-04-07: 3 [IU] via SUBCUTANEOUS
  Administered 2022-04-07: 2 [IU] via SUBCUTANEOUS

## 2022-04-07 MED ORDER — APIXABAN 5 MG PO TABS
5.0000 mg | ORAL_TABLET | Freq: Two times a day (BID) | ORAL | Status: DC
  Administered 2022-04-07 (×3): 5 mg via ORAL
  Filled 2022-04-07 (×4): qty 1

## 2022-04-07 MED ORDER — AZITHROMYCIN 250 MG PO TABS
500.0000 mg | ORAL_TABLET | Freq: Every day | ORAL | Status: DC
  Administered 2022-04-07: 500 mg via ORAL
  Filled 2022-04-07: qty 2

## 2022-04-07 NOTE — Progress Notes (Signed)
PROGRESS NOTE   [name]  [id number]    DOB: [birth year]    DOA: 04/05/2022  PCP: System, Provider Not In   I have briefly reviewed patients previous medical records in [facility].  Chief Complaint  Patient presents with   Suicidal   Pneumonia    Brief Narrative:  67 year old male with medical history of type II DM, HTN, HLD, C AD, CVA, bipolar disorder, chronic hallucinations, presented to the ED on 04/05/2022 with complaints of suicidal ideations and hallucinations.  He was evaluated by behavioral health and cleared for discharge.  Subsequently patient was noted to be weak and hypoxic to 88% requiring [place] oxygen and imaging showed findings concerning for right lower lobe pneumonia.  Admitted for hypoxia due to right lower lobe pneumonia, COPD exacerbation and acute kidney injury.  Ongoing suicidal ideations, consulted psychiatry again.   Assessment & Plan:  Principal Problem:   Right lower lobe pneumonia Active Problems:   Hypertension   Diabetes mellitus (HCC)   GAD (generalized anxiety disorder)   Coronary artery disease involving native coronary artery of native heart without angina pectoris   Hallucination   MDD (major depressive disorder)   Suicide ideation   RLL pneumonia   Bipolar 1 disorder ([place])   Community acquired pneumonia of right lower lobe of lung   Hypoxia due to right lower lobe pneumonia (possible aspiration pneumonia) and COPD exacerbation. In ED, noted O2 saturation of 85% on room air but no associated dyspnea or tachypnea.  Thereby does not qualify for acute respiratory failure with hypoxia diagnosis.  Had mild leukocytosis of 11.8 but no fever.  BNP 39.  Lactate normal.  Flu/RSV/COVID-19 testing negative.  Chest x-ray showed right lower lobe infiltrate.  CT head without acute findings.  Continue empirically started IV ceftriaxone and azithromycin which can be changed to Augmentin at discharge.  Wean oxygen as tolerated.  Continue oral  prednisone, as needed bronchodilator nebs.  Added flutter valve.  He has very poor dental hygiene with multiple caries and missing teeth.  Acute kidney injury Presented with creatinine of 2.12.  This is improved to 1.5.  Received time 11 did IV fluids.  Encourage oral intake.  Follow BMP in AM.  Baseline creatinine may be in the normal range but not entirely sure.  Bipolar disorder/hallucinations/suicidal ideations: Despite psychiatry having cleared him recent sleep, has ongoing suicidal ideations up to this morning.  Continue Air cabin crew.  Psychiatry consulted again for assistance.  Type II DM with hyperglycemia: May be worsened by steroids.  SSI.  No recent A1c since 2022, will check.  Hyperlipidemia:  Continue Crestor  GERD Continue Pepcid  Hypertension:  Controlled.  Continue carvedilol.  History of pulmonary embolism: On Eliquis.  CAD s/p CABG/cardiomyopathy Asymptomatic of angina.  Methamphetamine abuse  Aortic root and abdominal aneurysm     Body mass index is 28.6 kg/m.   DVT prophylaxis:   On apixaban anticoagulation   Code Status: Full Code:  Family Communication: None at bedside Disposition:  Status is: Inpatient Remains inpatient appropriate because: Pneumonia, AKI, IV antibiotics     Consultants:   Psychiatry  Procedures:     Antimicrobials:   As above   Subjective:  Seen this morning.  Sitter at bedside.  Patient sleeping but easily woken up.  Reports feeling bad but unable to elaborate.  States that he has some intermittent dyspnea.  Noted to be having some dry cough.  Denies chest pain.  Denies any hallucinations or suicidal ideations now  but states that earlier this morning he had thoughts of "killing himself".  Objective:   Vitals:   04/06/22 2200 04/06/22 2353 04/06/22 2353 04/07/22 0008  BP:  136/72 136/72   Pulse:  100 100   Resp:  18 18   Temp: 98.4 F (36.9 C) 98.6 F (37 C) 98.6 F (37 C)   TempSrc:  Oral Oral   SpO2:   94% 94%   Weight:    90.4 kg  Height:  5' 10"$  (1.778 m)      General exam: Middle-age male, disheveled, moderately built and poorly nourished lying comfortably supine in bed without distress. ENT: Very poor oral hygiene with black teeth with caries and missing teeth. Respiratory system: Few bibasal crackles.  Otherwise clear to auscultation.  No increased work of breathing.  Telemetry personally reviewed: Sinus rhythm. Cardiovascular system: S1 & S2 heard, RRR. No JVD, murmurs, rubs, gallops or clicks. No pedal edema. Gastrointestinal system: Abdomen is nondistended, soft and nontender. No organomegaly or masses felt. Normal bowel sounds heard. Central nervous system: Alert and oriented. No focal neurological deficits. Extremities: Symmetric 5 x 5 power. Skin: No rashes, lesions or ulcers Psychiatry: Judgement and insight appear impaired. Mood & affect flat.  SI but no HI.  Currently without hallucinations.    Data Reviewed:   I have personally reviewed following labs and imaging studies   CBC: Recent Labs  Lab 04/05/22 2018 04/06/22 1917 04/07/22 0011 04/07/22 0454  WBC 6.7 11.8* 8.4 6.8  NEUTROABS 4.4 9.5*  --  6.2  HGB 10.9* 11.6* 10.3* 9.9*  HCT 35.2* 37.4* 34.3* 32.3*  MCV 88.9 90.6 93.0 90.5  PLT 312 309 279 [id number]    Basic Metabolic Panel: Recent Labs  Lab 04/05/22 2018 04/06/22 1917 04/07/22 0011 04/07/22 0454  NA 136 133*  --  134*  K 4.0 4.9  --  4.5  CL 100 98  --  100  CO2 26 25  --  25  GLUCOSE 76 146*  --  159*  BUN 20 29*  --  31*  CREATININE 1.31* 2.12* 1.80* 1.50*  CALCIUM 8.6* 8.6*  --  8.4*    Liver Function Tests: Recent Labs  Lab 04/05/22 2018 04/06/22 1917  AST 14* 17  ALT 8 10  ALKPHOS 93 92  BILITOT 0.4 0.7  PROT 7.8 8.1  ALBUMIN 3.6 3.3*    CBG: Recent Labs  Lab 04/07/22 0747  GLUCAP 156*    Microbiology Studies:   Recent Results (from the past 240 hour(s))  Resp panel by RT-PCR (RSV, Flu A&B, Covid) Anterior Nasal Swab      Status: None   Collection Time: 04/05/22  9:38 PM   Specimen: Anterior Nasal Swab  Result Value Ref Range Status   SARS Coronavirus 2 by RT PCR NEGATIVE NEGATIVE Final    Comment: (NOTE) SARS-CoV-2 target nucleic acids are NOT DETECTED.  The SARS-CoV-2 RNA is generally detectable in upper respiratory specimens during the acute phase of infection. The lowest concentration of SARS-CoV-2 viral copies this assay can detect is 138 copies/mL. A negative result does not preclude SARS-Cov-2 infection and should not be used as the sole basis for treatment or other patient management decisions. A negative result may occur with  improper specimen collection/handling, submission of specimen other than nasopharyngeal swab, presence of viral mutation(s) within the areas targeted by this assay, and inadequate number of viral copies(<138 copies/mL). A negative result must be combined with clinical observations, patient history, and epidemiological information. The  expected result is Negative.  Fact Sheet for Patients:  EntrepreneurPulse.com.au  Fact Sheet for Healthcare Providers:  IncredibleEmployment.be  This test is no t yet approved or cleared by the [place] FDA and  has been authorized for detection and/or diagnosis of SARS-CoV-2 by FDA under an Emergency Use Authorization (EUA). This EUA will remain  in effect (meaning this test can be used) for the duration of the COVID-19 declaration under Section 564(b)(1) of the Act, 21 U.S.C.section 360bbb-3(b)(1), unless the authorization is terminated  or revoked sooner.       Influenza A by PCR NEGATIVE NEGATIVE Final   Influenza B by PCR NEGATIVE NEGATIVE Final    Comment: (NOTE) The Xpert Xpress SARS-CoV-2/FLU/RSV plus assay is intended as an aid in the diagnosis of influenza from Nasopharyngeal swab specimens and should not be used as a sole basis for treatment. Nasal washings and aspirates are  unacceptable for Xpert Xpress SARS-CoV-2/FLU/RSV testing.  Fact Sheet for Patients: EntrepreneurPulse.com.au  Fact Sheet for Healthcare Providers: IncredibleEmployment.be  This test is not yet approved or cleared by the [place] FDA and has been authorized for detection and/or diagnosis of SARS-CoV-2 by FDA under an Emergency Use Authorization (EUA). This EUA will remain in effect (meaning this test can be used) for the duration of the COVID-19 declaration under Section 564(b)(1) of the Act, 21 U.S.C. section 360bbb-3(b)(1), unless the authorization is terminated or revoked.     Resp Syncytial Virus by PCR NEGATIVE NEGATIVE Final    Comment: (NOTE) Fact Sheet for Patients: EntrepreneurPulse.com.au  Fact Sheet for Healthcare Providers: IncredibleEmployment.be  This test is not yet approved or cleared by the [place] FDA and has been authorized for detection and/or diagnosis of SARS-CoV-2 by FDA under an Emergency Use Authorization (EUA). This EUA will remain in effect (meaning this test can be used) for the duration of the COVID-19 declaration under Section 564(b)(1) of the Act, 21 U.S.C. section 360bbb-3(b)(1), unless the authorization is terminated or revoked.  Performed at [facility], [place] [address] [place], [place] [address]   Resp panel by RT-PCR (RSV, Flu A&B, Covid) Anterior Nasal Swab     Status: None   Collection Time: 04/06/22  7:25 PM   Specimen: Anterior Nasal Swab  Result Value Ref Range Status   SARS Coronavirus 2 by RT PCR NEGATIVE NEGATIVE Final    Comment: (NOTE) SARS-CoV-2 target nucleic acids are NOT DETECTED.  The SARS-CoV-2 RNA is generally detectable in upper respiratory specimens during the acute phase of infection. The lowest concentration of SARS-CoV-2 viral copies this assay can detect is 138 copies/mL. A negative result does not preclude  SARS-Cov-2 infection and should not be used as the sole basis for treatment or other patient management decisions. A negative result may occur with  improper specimen collection/handling, submission of specimen other than nasopharyngeal swab, presence of viral mutation(s) within the areas targeted by this assay, and inadequate number of viral copies(<138 copies/mL). A negative result must be combined with clinical observations, patient history, and epidemiological information. The expected result is Negative.  Fact Sheet for Patients:  EntrepreneurPulse.com.au  Fact Sheet for Healthcare Providers:  IncredibleEmployment.be  This test is no t yet approved or cleared by the [place] FDA and  has been authorized for detection and/or diagnosis of SARS-CoV-2 by FDA under an Emergency Use Authorization (EUA). This EUA will remain  in effect (meaning this test can be used) for the duration of the COVID-19 declaration under Section 564(b)(1) of the Act,  21 U.S.C.section 360bbb-3(b)(1), unless the authorization is terminated  or revoked sooner.       Influenza A by PCR NEGATIVE NEGATIVE Final   Influenza B by PCR NEGATIVE NEGATIVE Final    Comment: (NOTE) The Xpert Xpress SARS-CoV-2/FLU/RSV plus assay is intended as an aid in the diagnosis of influenza from Nasopharyngeal swab specimens and should not be used as a sole basis for treatment. Nasal washings and aspirates are unacceptable for Xpert Xpress SARS-CoV-2/FLU/RSV testing.  Fact Sheet for Patients: EntrepreneurPulse.com.au  Fact Sheet for Healthcare Providers: IncredibleEmployment.be  This test is not yet approved or cleared by the [place] FDA and has been authorized for detection and/or diagnosis of SARS-CoV-2 by FDA under an Emergency Use Authorization (EUA). This EUA will remain in effect (meaning this test can be used) for the duration of  the COVID-19 declaration under Section 564(b)(1) of the Act, 21 U.S.C. section 360bbb-3(b)(1), unless the authorization is terminated or revoked.     Resp Syncytial Virus by PCR NEGATIVE NEGATIVE Final    Comment: (NOTE) Fact Sheet for Patients: EntrepreneurPulse.com.au  Fact Sheet for Healthcare Providers: IncredibleEmployment.be  This test is not yet approved or cleared by the [place] FDA and has been authorized for detection and/or diagnosis of SARS-CoV-2 by FDA under an Emergency Use Authorization (EUA). This EUA will remain in effect (meaning this test can be used) for the duration of the COVID-19 declaration under Section 564(b)(1) of the Act, 21 U.S.C. section 360bbb-3(b)(1), unless the authorization is terminated or revoked.  Performed at [facility], [place] [address] [place], [place] [address]     Radiology Studies:  DG Chest 2 View  Result Date: 04/06/2022 CLINICAL DATA:  Cough, dyspnea EXAM: CHEST - 2 VIEW COMPARISON:  01/10/2022 FINDINGS: Patchy airspace infiltrate is developed within the right lower lobe, possibly infectious in the appropriate clinical setting. The lungs are otherwise clear. No pneumothorax or pleural effusion. Coronary artery bypass grafting has been performed. Pulmonary vascularity is normal. No acute bone abnormality. IMPRESSION: 1. Right lower lobe infiltrate, possibly infectious in the appropriate clinical setting. Electronically Signed   By: Fidela Salisbury M.D.   On: 04/06/2022 19:48   CT Head Wo Contrast  Result Date: 04/06/2022 CLINICAL DATA:  Mental status change EXAM: CT HEAD WITHOUT CONTRAST TECHNIQUE: Contiguous axial images were obtained from the base of the skull through the vertex without intravenous contrast. RADIATION DOSE REDUCTION: This exam was performed according to the departmental dose-optimization program which includes automated exposure control, adjustment of the mA  and/or kV according to patient size and/or use of iterative reconstruction technique. COMPARISON:  Head CT 03/09/2022.  MRI brain 10/17/2013. FINDINGS: Brain: No evidence of acute infarction, hemorrhage, hydrocephalus, extra-axial collection or mass lesion/mass effect. Small chronic infarct in the right cerebellum is unchanged. Vascular: Atherosclerotic calcifications are present within the cavernous internal carotid arteries. Skull: Normal. Negative for fracture or focal lesion. Sinuses/Orbits: No acute finding. Other: None. IMPRESSION: 1. No acute intracranial abnormality. 2. Stable small chronic infarct in the right cerebellum. Electronically Signed   By: Ronney Asters M.D.   On: 04/06/2022 18:59    Scheduled Meds:    apixaban  5 mg Oral BID   carvedilol  6.25 mg Oral BID WC   insulin aspart  0-15 Units Subcutaneous TID WC   insulin aspart  0-5 Units Subcutaneous QHS   predniSONE  40 mg Oral Q breakfast   rosuvastatin  20 mg Oral QHS    Continuous Infusions:  azithromycin     cefTRIAXone (ROCEPHIN)  IV       LOS: 1 day     Vernell Leep, MD,  FACP, [facility], [facility], [facility], [place]     To contact the attending provider between 7A-7P or the covering provider during after hours 7P-7A, please log into the web site [email or web address] and access using universal [place] password for that web site. If you do not have the password, please call the hospital operator.  04/07/2022, 10:25 AM

## 2022-04-07 NOTE — TOC Initial Note (Signed)
Transition of Care [facility]) - Initial/Assessment Note    Patient Details  Name: [name] MRN: [id number] Date of Birth: 1955/07/25  Transition of Care [facility]) CM/SW Contact:    Vassie Moselle, LCSW Phone Number: 04/07/2022, 4:27 PM  Clinical Narrative:                 Met with pt at bedside. Pt shares he is currently active with [facility]. He shares he has requested to see the provider on his ACT Team 3x and has yet to see him. Pt shares he currently lives in a single family home by himself. He states he does not want to go home and would rather go to the streets due to believing that people are out to kill him. He reports experiencing AVH of two people threatening to kill him. He is able to realize this is in his head and are hallucinations but, is unable to separate from reality. Pt shares he had these specific hallucinations the day he came into the hospital and has not had them since. Pt denies current SI.  Pt would like to go to substance use rehab and is agreeable to have information placed on his AVS.  TOC will reach out to ACT Team to discuss support and discharge plans.   Expected Discharge Plan: Home/Self Care Barriers to Discharge: No Barriers Identified   Patient Goals and CMS Choice Patient states their goals for this hospitalization and ongoing recovery are:: To be safe CMS Medicare.gov Compare Post Acute Care list provided to:: Patient Choice offered to / list presented to : Patient      Expected Discharge Plan and Services In-house Referral: Clinical Social Work Discharge Planning Services: NA Post Acute Care Choice: NA Living arrangements for the past 2 months: Single Family Home                 DME Arranged: N/A DME Agency: NA                  Prior Living Arrangements/Services Living arrangements for the past 2 months: Single Family Home Lives with:: Self Patient language and need for interpreter reviewed:: Yes Do  you feel safe going back to the place where you live?: No   Experiencing AVH and believes people are trying to kill him  Need for Family Participation in Patient Care: No (Comment) Care giver support system in place?: No (comment) Current home services: DME Criminal Activity/Legal Involvement Pertinent to Current Situation/Hospitalization: No - Comment as needed  Activities of Daily Living Home Assistive Devices/Equipment: Eyeglasses ADL Screening (condition at time of admission) Patient's cognitive ability adequate to safely complete daily activities?: Yes Is the patient deaf or have difficulty hearing?: No Does the patient have difficulty seeing, even when wearing glasses/contacts?: Yes Does the patient have difficulty concentrating, remembering, or making decisions?: No Patient able to express need for assistance with ADLs?: Yes Does the patient have difficulty dressing or bathing?: Yes Independently performs ADLs?: Yes (appropriate for developmental age) Does the patient have difficulty walking or climbing stairs?: No Weakness of Legs: Both Weakness of Arms/Hands: None  Permission Sought/Granted Permission sought to share information with : Case Manager Permission granted to share information with : Yes, Verbal Permission Granted     Permission granted to share info w AGENCY: [facility]        Emotional Assessment Appearance:: Appears stated age Attitude/Demeanor/Rapport: Engaged, Paranoid Affect (typically observed): Pleasant, Depressed, Afraid/Fearful Orientation: : Oriented to Self, Oriented  to Place, Oriented to  Time, Oriented to Situation Alcohol / Substance Use: Illicit Drugs, Tobacco Use Psych Involvement: Yes (comment), Outpatient Provider (ACTT)  Admission diagnosis:  Hallucinations [R44.3] Suicidal ideation [R45.851] RLL pneumonia [J18.9] Patient Active Problem List   Diagnosis Date Noted   Bipolar 1 disorder ([place]) 04/07/2022   Community acquired pneumonia of  right lower lobe of lung 04/07/2022   Substance induced mood disorder ([place]) 04/07/2022   Suicidal ideation 04/06/2022   Right lower lobe pneumonia 04/06/2022   RLL pneumonia 04/06/2022   Hallucinations 03/09/2022   MDD (major depressive disorder) 03/09/2022   Acute pulmonary embolism without acute cor pulmonale (HCC)    Non-ST elevation (NSTEMI) myocardial infarction [facility])    Coronary artery disease involving native coronary artery of native heart without angina pectoris    Cardiac arrest ([place]) 03/02/2020   Severe episode of recurrent major depressive disorder, without psychotic features ([place])    Opioid use disorder, severe, in sustained remission ([place]) 05/25/2015   Benzodiazepine abuse ([place]) 05/25/2015   Cannabis use disorder, moderate, dependence ([place]) 05/25/2015   MDD (major depressive disorder), recurrent episode, severe ([place]) 05/23/2015   Chronic pain syndrome 05/23/2015   GAD (generalized anxiety disorder) 05/23/2015   CVA (cerebral infarction) 10/15/2013   Hypertension 10/15/2013   Diabetes mellitus ([place]) 10/15/2013   PCP:  System, Provider Not In Pharmacy:   [place], [place] - [place] [facility] [address] [place] [address] Phone: [phone] Fax: [phone]     Social Determinants of Health (SDOH) Social History: SDOH Screenings   Food Insecurity: No Food Insecurity (04/07/2022)  Housing: Low Risk  (04/07/2022)  Transportation Needs: Unmet Transportation Needs (04/07/2022)  Utilities: Not At Risk (04/07/2022)  Tobacco Use: High Risk (04/07/2022)   SDOH Interventions:     Readmission Risk Interventions    04/07/2022    4:22 PM  Readmission Risk Prevention Plan  Transportation Screening Complete  PCP or Specialist Appt within 3-5 Days Complete  HRI or [place] Complete  Social Work Consult for [place] Planning/Counseling Complete  Palliative Care Screening Not Applicable  Medication Review Press photographer)  Complete

## 2022-04-07 NOTE — Consult Note (Signed)
[name] is a 67 y.o.  with a history significant for depression, anxiety polysubstance abuse, who presented to the ED from home with complaints of suicidal ideation, auditory visual hallucinations.  While in the ED patient endorsed plan to shoot self with a gun, and being noncompliant with his psychotropic medications.  Patient had similar emergency room visit 1 month ago in which she presented with auditory hallucinations for 7 months.  At that time medical workup was initiated to rule out organic cause, and workup for chronic lower extremity pain.  He was seen and assessed by psychiatric provider at that time.  On yesterday's assessment by psychiatric provider, patient denied suicidal ideations or any recent attempts to harm self.  Patient did admit to substance use, and auditory hallucinations.  Per chart reviewed this does appear to be more chronic in nature versus acute onset of hallucinations.  Patient has endorsed using crystal meth and ice about 6 to 7 months ago.  Patien also admitted to noncompliance with his medication " sometimes I just forget about it."  Patient denies having any outpatient psychiatrist or therapist, also declined a referral for inpatient psychiatric hospitalization.  As with previous psychiatric evaluation patient was determined to not meet inpatient psychiatric criteria.  His symptoms resolved after cessation of illicit substance.  Patient has no history of suicide attempt.  As notes above, the patient's statement that he will attempt suicide if discharged appears to be an expression of unmet needs (housing, pain management) that is representative of limited and often-maladaptive coping and skills, rather than an indicator of imminent risk of death. The patient is unwilling to participate in any interventions.  Patient denied active SI yesterday and at the mention of stability by his primary team he suddenly endorsed active return of suicidal ideations, also reports  to [employer] with a plan but on reassessment by Psychiatry later in the AM patient denied SI.  In regards to his suicidality and mood symptoms, his history is pertinent for significant substance-induced mood disorder. He is currently endorsing passive SI with plan or intent that arose yesterday in the setting of substance withdrawal. Per chart review, he had similar presentation in the setting of psychosis 2/t substance use and withdrawal during last admission that remitted after he was outside of the withdrawal window. His symptoms at this time would not meet DSM-V criteria for MDD given time course. Therefore this likely represents a substance-induced depressive episode. Also concern for co-occurring anxiety and hallucinations, recommend avoidance of stressors that have caused significant distress in his life and refrain ffrom substances.   In regards to his auditory hallucinations, these arose in the setting of substance use and are intermittent, which aligns with a substance-induced psychosis rather than a primary thought disorder. He does not require treatment with an antipsychotic given minimal current symptoms at this time.    -TOC consult for inpatient rehabilitation referral to [facility], [facility], [facility].  Please provide resources and assistance to guide patient with appropriate referral process.  Patient has received extensive psychoeducation surrounding the need to stop illicit substance use, due to ongoing and worsening psychiatric symptoms.  Would benefit from inpatient residential/rehabilitation.  -Patient may benefit from community support team to provide stability by reducing further psychiatric, and or substance use symptoms, interventions for coping skills, psychoeducation, and service coordination to community services to include ongoing mental health care.   -Will discontinue suicide sitter at this time. -Also does not benefit from an additional psychiatric consult, as patient was  seen yesterday afternoon prior to being admitted to [facility] patient for hypoxia.  At this time he will remain psychiatrically cleared.  Recommend that he resume his medications for mental health, and refrain from use of illicit substances at this time.

## 2022-04-08 DIAGNOSIS — J189 Pneumonia, unspecified organism: Secondary | ICD-10-CM | POA: Diagnosis not present

## 2022-04-08 LAB — BASIC METABOLIC PANEL
Anion gap: 10 (ref 5–15)
BUN: 29 mg/dL — ABNORMAL HIGH (ref 8–23)
CO2: 26 mmol/L (ref 22–32)
Calcium: 8.3 mg/dL — ABNORMAL LOW (ref 8.9–10.3)
Chloride: 98 mmol/L (ref 98–111)
Creatinine, Ser: 1.06 mg/dL (ref 0.61–1.24)
GFR, Estimated: >60 mL/min (ref >60)
Glucose, Bld: 115 mg/dL — ABNORMAL HIGH (ref 70–99)
Potassium: 4 mmol/L (ref 3.5–5.1)
Sodium: 134 mmol/L — ABNORMAL LOW (ref 135–145)

## 2022-04-08 LAB — GLUCOSE, CAPILLARY: Glucose-Capillary: 117 mg/dL — ABNORMAL HIGH (ref 70–99)

## 2022-04-08 LAB — CBC
HCT: 31.7 % — ABNORMAL LOW (ref 39.0–52.0)
Hemoglobin: 9.9 g/dL — ABNORMAL LOW (ref 13.0–17.0)
MCH: 27.7 pg (ref 26.0–34.0)
MCHC: 31.2 g/dL (ref 30.0–36.0)
MCV: 88.8 fL (ref 80.0–100.0)
Platelets: 270 10*3/uL (ref 150–400)
RBC: 3.57 MIL/uL — ABNORMAL LOW (ref 4.22–5.81)
RDW: 16.6 % — ABNORMAL HIGH (ref 11.5–15.5)
WBC: 6.2 10*3/uL (ref 4.0–10.5)
nRBC: 0 % (ref 0.0–0.2)

## 2022-04-08 MED ORDER — AMOXICILLIN-POT CLAVULANATE 875-125 MG PO TABS: 1.0000 | ORAL_TABLET | Freq: Two times a day (BID) | ORAL | 0 refills | Status: DC

## 2022-04-08 MED ORDER — APIXABAN 5 MG PO TABS: 5.0000 mg | ORAL_TABLET | Freq: Two times a day (BID) | ORAL | Status: AC

## 2022-04-08 NOTE — TOC Transition Note (Signed)
Transition of Care [facility]) - CM/SW Discharge Note   Patient Details  Name: [name] MRN: [id number] Date of Birth: [birth year]  Transition of Care [facility]) CM/SW Contact:  Vassie Moselle, LCSW Phone Number: 04/08/2022, 12:57 PM   Clinical Narrative:    Spoke with pt's ACT Team who are unable to pick pt up at discharge due to scheduling conflicts. ACTT is to see pt tomorrow at his house.  Met with pt to inform of this. Pt shares he does not feel safe at home and does not believe he will be alive when ACTT comes to see him. Pt then shares "I met as well kill myself instead of waiting to be killed." And later on pt makes statements of "I'll take care of it" when talking about his situation and going home. MD notified.  Pt does not have transportation home. Taxi voucher provided for transportation. Ride waiver signed and placed in chart.    Final next level of care: Home/Self Care Barriers to Discharge: No Barriers Identified   Patient Goals and CMS Choice CMS Medicare.gov Compare Post Acute Care list provided to:: Patient Choice offered to / list presented to : Patient  Discharge Placement                         Discharge Plan and Services Additional resources added to the After Visit Summary for   In-house Referral: Clinical Social Work Discharge Planning Services: NA Post Acute Care Choice: NA          DME Arranged: N/A DME Agency: NA                  Social Determinants of Health (SDOH) Interventions SDOH Screenings   Food Insecurity: No Food Insecurity (04/07/2022)  Housing: Low Risk  (04/07/2022)  Transportation Needs: Unmet Transportation Needs (04/07/2022)  Utilities: Not At Risk (04/07/2022)  Tobacco Use: High Risk (04/07/2022)     Readmission Risk Interventions    04/07/2022    4:22 PM  Readmission Risk Prevention Plan  Transportation Screening Complete  PCP or Specialist Appt within 3-5 Days Complete  HRI or [place] Complete   Social Work Consult for [place] Planning/Counseling Complete  Palliative Care Screening Not Applicable  Medication Review Press photographer) Complete

## 2022-04-08 NOTE — Discharge Summary (Signed)
Physician Discharge Summary  [name] [id number] DOB: [birth year]  PCP: Loura Pardon, MD  Admitted from: Home Discharged to: Home  Admit date: 04/05/2022 Discharge date: 04/08/2022  Recommendations for Outpatient Follow-up:    [facility] Follow up.   Specialty: Behavioral Health Why: As needed, If symptoms worsen Contact information: [place]. Call.   Why: Please follow up with you ACT Team at [facility] information: [place] [address] [phone]         Loura Pardon, MD. Schedule an appointment as soon as possible for a visit in 1 week(s).   Specialty: Family Medicine Why: [facility] labs (CBC & BMP).  Please take all your medications with you for the MD office visit. Contact information: [place] [address] [phone]                  Home Health: None    Equipment/Devices: None    Discharge Condition: Improved and stable.   Code Status: Full Code Diet recommendation:  Discharge Diet Orders (From admission, onward)     Start     Ordered   04/08/22 0000  Diet - low sodium heart healthy        04/08/22 0940   04/08/22 0000  Diet Carb Modified        04/08/22 0940             Discharge Diagnoses:  Principal Problem:   Right lower lobe pneumonia Active Problems:   Hypertension   Diabetes mellitus ([place])   GAD (generalized anxiety disorder)   Coronary artery disease involving native coronary artery of native heart without angina pectoris   Hallucinations   MDD (major depressive disorder)   Suicidal ideation   RLL pneumonia   Bipolar 1 disorder ([place])   Community acquired pneumonia of right lower lobe of lung   Substance induced mood disorder ([place])   Brief Summary: 67 year old male with medical history of type II DM, HTN, HLD,  C AD, CVA, bipolar disorder, chronic hallucinations, presented to the ED on 04/05/2022 with complaints of suicidal ideations and hallucinations.  He was evaluated by behavioral health and cleared for discharge.  Subsequently patient was noted to be weak and hypoxic to 88% requiring [place] oxygen and imaging showed findings concerning for right lower lobe pneumonia.  Admitted for hypoxia due to right lower lobe pneumonia, COPD exacerbation and acute kidney injury.  Ongoing suicidal ideations, consulted psychiatry again.     Assessment & Plan:   Hypoxia due to right lower lobe pneumonia (possible aspiration pneumonia) and COPD exacerbation. In ED, noted O2 saturation of 85% on room air but no associated dyspnea or tachypnea.  Thereby does not qualify for acute respiratory failure with hypoxia diagnosis.  Had mild leukocytosis of 11.8 but no fever.  BNP 39.  Lactate normal.  Flu/RSV/COVID-19 testing negative.  Chest x-ray showed right lower lobe infiltrate.  CT head without acute findings.  Treated empirically with IV ceftriaxone and azithromycin and completed 2 days treatment.  Weaned off oxygen and hypoxia resolved.  Only took a dose of prednisone on 2/11 and has not received any since, refused this morning's dose.  Added flutter valve.  He has very poor dental hygiene with multiple caries and missing teeth.  Clinically improved.  Hypoxia resolved.  Transitioned to Augmentin to complete total 7 days of  antibiotics.  Recommend repeating chest x-ray in 4 weeks to ensure resolution of pneumonia findings-this can be followed up by PCP.   Acute kidney injury Presented with creatinine of 2.12.  Treated briefly with IV fluids.  AKI resolved.   Bipolar disorder/hallucinations/suicidal ideations: Despite psychiatry having cleared him recently, had ongoing suicidal ideations on 2/12.  Safety sitter were continued.  Psychiatry was consulted who discontinued his suicide sitter and indicated that she does not benefit  from additional psychiatric consultation as patient had been seen the day prior on 2/11.  Per psychiatry, he has received extensive psychoeducation surrounding the need to stop illicit substance use, due to ongoing and worsening psychiatric symptoms.  That he would benefit from inpatient residential/rehabilitation.  Communicated extensively with social work about this yesterday and they indicated that they would not be able to assist with such placement and he would have to voluntarily go there and they have provided him with resources.  After patient had been discharged, clinical social work again communicated that patient was expressing "passive suicidality again".  Communicated extensively with Mr. Jefferson Fuel- Henrene Pastor, Psychiatry FNP and the social worker along with patient's RN and floor charge nurse.  Ms. Henrene Pastor reiterated that patient had been seen yesterday, he may continue to have suicidal thoughts but still discharge and that patient had to voluntarily find an inpatient residential/rehabilitation facility.  She also reiterated that he had been "psychiatrically cleared".  I had advised the psychiatric team that from a medical standpoint he was cleared to discharge to inpatient psychiatry if they deemed that to be necessary but they indicated that he could be discharged home.    Type II DM with hyperglycemia: May be worsened by steroids.  SSI.  A1c 6.3 on 04/07/2022 suggest good outpatient control.  Good inpatient control.   Hyperlipidemia:  Continue Crestor   GERD Continue Pepcid   Hypertension:  Controlled.  Continue carvedilol.   History of pulmonary embolism: On Eliquis.  It appears that provider above recently prescribed him with Eliquis and Plavix.     CAD s/p CABG/cardiomyopathy Asymptomatic of angina.   Methamphetamine abuse   Aortic root and abdominal aneurysm   Polypharmacy: Several medications are listed on patient's home med rec.  It is really uncertain as to which of  these he really takes.  Patient himself states that he takes all of them "sometimes ".  Counseled extensively regarding importance of compliance with all aspects of medical care including MD follow-ups, taking his medications etc.  Also advised him that he should take all his medications for his next PCP visit so that they can verify and adjust his medications as needed.  Since both aspirin and Plavix were listed along with Eliquis, not sure if he is really taking any of these, discontinued aspirin to minimize bleeding risk.  Patient to closely follow with PCP.  Medical noncompliance: Counseled.     Body mass index is 28.6 kg/m.     Consultants:   Psychiatry   Procedures:      Discharge Instructions  Discharge Instructions     Call MD for:  difficulty breathing, headache or visual disturbances   Complete by: As directed    Call MD for:  extreme fatigue   Complete by: As directed    Call MD for:  persistant dizziness or light-headedness   Complete by: As directed    Call MD for:  persistant nausea and vomiting   Complete by: As directed    Call MD for:  severe uncontrolled  pain   Complete by: As directed    Call MD for:  temperature >100.4   Complete by: As directed    Diet - low sodium heart healthy   Complete by: As directed    Diet Carb Modified   Complete by: As directed    Increase activity slowly   Complete by: As directed    No wound care   Complete by: As directed         Medication List     STOP taking these medications    aspirin EC 81 MG tablet   doxycycline 100 MG capsule Commonly known as: VIBRAMYCIN       TAKE these medications    albuterol 108 (90 Base) MCG/ACT inhaler Commonly known as: VENTOLIN HFA Inhale 2 puffs into the lungs every 4 (four) hours as needed for wheezing or shortness of breath.   amoxicillin-clavulanate 875-125 MG tablet Commonly known as: AUGMENTIN Take 1 tablet by mouth 2 (two) times daily for 5 days.   apixaban 5  MG Tabs tablet Commonly known as: ELIQUIS Take 1 tablet (5 mg total) by mouth 2 (two) times daily.   blood glucose meter kit and supplies Kit Dispense based on patient and insurance preference. Use up to four times daily as directed. (FOR ICD-9 250.00, 250.01).   carvedilol 6.25 MG tablet Commonly known as: COREG TAKE 1 TABLET (6.25 MG TOTAL) BY MOUTH TWO TIMES DAILY WITH A MEAL. What changed: how much to take   clopidogrel 75 MG tablet Commonly known as: PLAVIX Take 75 mg by mouth daily.   Entresto 24-26 MG Generic drug: sacubitril-valsartan Take 1 tablet by mouth 2 (two) times daily.   famotidine 20 MG tablet Commonly known as: PEPCID TAKE 1 TABLET (20 MG TOTAL) BY MOUTH TWO TIMES DAILY. What changed:  how much to take how to take this when to take this   gabapentin 600 MG tablet Commonly known as: NEURONTIN Take 1,200 mg by mouth 3 (three) times daily.   glipiZIDE 10 MG 24 hr tablet Commonly known as: GLUCOTROL XL TAKE 1 TABLET (10 MG TOTAL) BY MOUTH DAILY. What changed: how much to take   metFORMIN 1000 MG tablet Commonly known as: GLUCOPHAGE Take 1,000 mg by mouth 2 (two) times daily with a meal.   polyethylene glycol 17 g packet Commonly known as: MIRALAX / GLYCOLAX Take 17 g by mouth daily.   rosuvastatin 20 MG tablet Commonly known as: CRESTOR Take 20 mg by mouth at bedtime.   senna-docusate 8.6-50 MG tablet Commonly known as: Senokot-S Take 2 tablets by mouth daily as needed for mild constipation.   sertraline 50 MG tablet Commonly known as: ZOLOFT Take 75 mg by mouth daily. Taking 1 & 1/2 tabs daily = 75 mg 7 DS delivered on 04-01-22   thiamine 100 MG tablet Commonly known as: Vitamin B-1 Take 100 mg by mouth daily.   tiZANidine 4 MG capsule Commonly known as: ZANAFLEX Take 4 mg by mouth 2 (two) times daily as needed for muscle spasms.   traMADol 50 MG tablet Commonly known as: ULTRAM Take 50 mg by mouth every 6 (six) hours as needed for  moderate pain.   traZODone 50 MG tablet Commonly known as: DESYREL Take 50 mg by mouth at bedtime.   Trulicity 1.5 0000000 Sopn Generic drug: Dulaglutide Inject 1.5 mg into the skin once a week. Last dose UNK and Day of week not known       No Known Allergies  Procedures/Studies: DG Chest 2 View  Result Date: 04/06/2022 CLINICAL DATA:  Cough, dyspnea EXAM: CHEST - 2 VIEW COMPARISON:  01/10/2022 FINDINGS: Patchy airspace infiltrate is developed within the right lower lobe, possibly infectious in the appropriate clinical setting. The lungs are otherwise clear. No pneumothorax or pleural effusion. Coronary artery bypass grafting has been performed. Pulmonary vascularity is normal. No acute bone abnormality. IMPRESSION: 1. Right lower lobe infiltrate, possibly infectious in the appropriate clinical setting. Electronically Signed   By: Fidela Salisbury M.D.   On: 04/06/2022 19:48   CT Head Wo Contrast  Result Date: 04/06/2022 CLINICAL DATA:  Mental status change EXAM: CT HEAD WITHOUT CONTRAST TECHNIQUE: Contiguous axial images were obtained from the base of the skull through the vertex without intravenous contrast. RADIATION DOSE REDUCTION: This exam was performed according to the departmental dose-optimization program which includes automated exposure control, adjustment of the mA and/or kV according to patient size and/or use of iterative reconstruction technique. COMPARISON:  Head CT 03/09/2022.  MRI brain 10/17/2013. FINDINGS: Brain: No evidence of acute infarction, hemorrhage, hydrocephalus, extra-axial collection or mass lesion/mass effect. Small chronic infarct in the right cerebellum is unchanged. Vascular: Atherosclerotic calcifications are present within the cavernous internal carotid arteries. Skull: Normal. Negative for fracture or focal lesion. Sinuses/Orbits: No acute finding. Other: None. IMPRESSION: 1. No acute intracranial abnormality. 2. Stable small chronic infarct in the right  cerebellum. Electronically Signed   By: Ronney Asters M.D.   On: 04/06/2022 18:59    Subjective: No cough, dyspnea, fever or chills.  Has been off of oxygen.  States that he ambulated to the bathroom earlier without any difficulty.  He did not express any suicidal or homicidal ideations to this provider but subsequently did to the social worker as noted above.  Discharge Exam:  Vitals:   04/07/22 0008 04/07/22 1338 04/07/22 1953 04/08/22 0502  BP:  (!) 118/54 128/70 (!) 146/78  Pulse:  88 91 85  Resp:  16 17 19  $ Temp:  98.7 F (37.1 C) 98.3 F (36.8 C) 97.7 F (36.5 C)  TempSrc:  Oral Oral Oral  SpO2:  98% 99% 95%  Weight: 90.4 kg     Height:        General exam: Middle-age male, disheveled, moderately built and poorly nourished lying comfortably supine in bed without distress. ENT: Very poor oral hygiene with black teeth with caries and missing teeth. Respiratory system: Clear to auscultation.  No increased work of breathing. Cardiovascular system: S1 & S2 heard, RRR. No JVD, murmurs, rubs, gallops or clicks. No pedal edema. Gastrointestinal system: Abdomen is nondistended, soft and nontender. No organomegaly or masses felt. Normal bowel sounds heard. Central nervous system: Alert and oriented. No focal neurological deficits. Extremities: Symmetric 5 x 5 power. Skin: No rashes, lesions or ulcers Psychiatry: Judgement and insight appear impaired. Mood & affect flat.  During my interview, no SI, HI or hallucinations.   The results of significant diagnostics from this hospitalization (including imaging, microbiology, ancillary and laboratory) are listed below for reference.     Microbiology: Recent Results (from the past 240 hour(s))  Resp panel by RT-PCR (RSV, Flu A&B, Covid) Anterior Nasal Swab     Status: None   Collection Time: 04/05/22  9:38 PM   Specimen: Anterior Nasal Swab  Result Value Ref Range Status   SARS Coronavirus 2 by RT PCR NEGATIVE NEGATIVE Final     Comment: (NOTE) SARS-CoV-2 target nucleic acids are NOT DETECTED.  The SARS-CoV-2 RNA is generally detectable  in upper respiratory specimens during the acute phase of infection. The lowest concentration of SARS-CoV-2 viral copies this assay can detect is 138 copies/mL. A negative result does not preclude SARS-Cov-2 infection and should not be used as the sole basis for treatment or other patient management decisions. A negative result may occur with  improper specimen collection/handling, submission of specimen other than nasopharyngeal swab, presence of viral mutation(s) within the areas targeted by this assay, and inadequate number of viral copies(<138 copies/mL). A negative result must be combined with clinical observations, patient history, and epidemiological information. The expected result is Negative.  Fact Sheet for Patients:  EntrepreneurPulse.com.au  Fact Sheet for Healthcare Providers:  IncredibleEmployment.be  This test is no t yet approved or cleared by the [place] FDA and  has been authorized for detection and/or diagnosis of SARS-CoV-2 by FDA under an Emergency Use Authorization (EUA). This EUA will remain  in effect (meaning this test can be used) for the duration of the COVID-19 declaration under Section 564(b)(1) of the Act, 21 U.S.C.section 360bbb-3(b)(1), unless the authorization is terminated  or revoked sooner.       Influenza A by PCR NEGATIVE NEGATIVE Final   Influenza B by PCR NEGATIVE NEGATIVE Final    Comment: (NOTE) The Xpert Xpress SARS-CoV-2/FLU/RSV plus assay is intended as an aid in the diagnosis of influenza from Nasopharyngeal swab specimens and should not be used as a sole basis for treatment. Nasal washings and aspirates are unacceptable for Xpert Xpress SARS-CoV-2/FLU/RSV testing.  Fact Sheet for Patients: EntrepreneurPulse.com.au  Fact Sheet for Healthcare  Providers: IncredibleEmployment.be  This test is not yet approved or cleared by the [place] FDA and has been authorized for detection and/or diagnosis of SARS-CoV-2 by FDA under an Emergency Use Authorization (EUA). This EUA will remain in effect (meaning this test can be used) for the duration of the COVID-19 declaration under Section 564(b)(1) of the Act, 21 U.S.C. section 360bbb-3(b)(1), unless the authorization is terminated or revoked.     Resp Syncytial Virus by PCR NEGATIVE NEGATIVE Final    Comment: (NOTE) Fact Sheet for Patients: EntrepreneurPulse.com.au  Fact Sheet for Healthcare Providers: IncredibleEmployment.be  This test is not yet approved or cleared by the [place] FDA and has been authorized for detection and/or diagnosis of SARS-CoV-2 by FDA under an Emergency Use Authorization (EUA). This EUA will remain in effect (meaning this test can be used) for the duration of the COVID-19 declaration under Section 564(b)(1) of the Act, 21 U.S.C. section 360bbb-3(b)(1), unless the authorization is terminated or revoked.  Performed at [facility], [place] [address] [place], [place] [address]   Resp panel by RT-PCR (RSV, Flu A&B, Covid) Anterior Nasal Swab     Status: None   Collection Time: 04/06/22  7:25 PM   Specimen: Anterior Nasal Swab  Result Value Ref Range Status   SARS Coronavirus 2 by RT PCR NEGATIVE NEGATIVE Final    Comment: (NOTE) SARS-CoV-2 target nucleic acids are NOT DETECTED.  The SARS-CoV-2 RNA is generally detectable in upper respiratory specimens during the acute phase of infection. The lowest concentration of SARS-CoV-2 viral copies this assay can detect is 138 copies/mL. A negative result does not preclude SARS-Cov-2 infection and should not be used as the sole basis for treatment or other patient management decisions. A negative result may occur with  improper  specimen collection/handling, submission of specimen other than nasopharyngeal swab, presence of viral mutation(s) within the areas targeted by this assay, and inadequate number  of viral copies(<138 copies/mL). A negative result must be combined with clinical observations, patient history, and epidemiological information. The expected result is Negative.  Fact Sheet for Patients:  EntrepreneurPulse.com.au  Fact Sheet for Healthcare Providers:  IncredibleEmployment.be  This test is no t yet approved or cleared by the [place] FDA and  has been authorized for detection and/or diagnosis of SARS-CoV-2 by FDA under an Emergency Use Authorization (EUA). This EUA will remain  in effect (meaning this test can be used) for the duration of the COVID-19 declaration under Section 564(b)(1) of the Act, 21 U.S.C.section 360bbb-3(b)(1), unless the authorization is terminated  or revoked sooner.       Influenza A by PCR NEGATIVE NEGATIVE Final   Influenza B by PCR NEGATIVE NEGATIVE Final    Comment: (NOTE) The Xpert Xpress SARS-CoV-2/FLU/RSV plus assay is intended as an aid in the diagnosis of influenza from Nasopharyngeal swab specimens and should not be used as a sole basis for treatment. Nasal washings and aspirates are unacceptable for Xpert Xpress SARS-CoV-2/FLU/RSV testing.  Fact Sheet for Patients: EntrepreneurPulse.com.au  Fact Sheet for Healthcare Providers: IncredibleEmployment.be  This test is not yet approved or cleared by the [place] FDA and has been authorized for detection and/or diagnosis of SARS-CoV-2 by FDA under an Emergency Use Authorization (EUA). This EUA will remain in effect (meaning this test can be used) for the duration of the COVID-19 declaration under Section 564(b)(1) of the Act, 21 U.S.C. section 360bbb-3(b)(1), unless the authorization is terminated or revoked.     Resp  Syncytial Virus by PCR NEGATIVE NEGATIVE Final    Comment: (NOTE) Fact Sheet for Patients: EntrepreneurPulse.com.au  Fact Sheet for Healthcare Providers: IncredibleEmployment.be  This test is not yet approved or cleared by the [place] FDA and has been authorized for detection and/or diagnosis of SARS-CoV-2 by FDA under an Emergency Use Authorization (EUA). This EUA will remain in effect (meaning this test can be used) for the duration of the COVID-19 declaration under Section 564(b)(1) of the Act, 21 U.S.C. section 360bbb-3(b)(1), unless the authorization is terminated or revoked.  Performed at [facility], [place] [address] [place], [place] [address]   Blood culture (routine x 2)     Status: None (Preliminary result)   Collection Time: 04/07/22 12:11 AM   Specimen: BLOOD  Result Value Ref Range Status   Specimen Description   Final    BLOOD BLOOD RIGHT ARM Performed at [facility] [address], [place], [place] [address]    Special Requests   Final    BOTTLES DRAWN AEROBIC ONLY Blood Culture adequate volume Performed at [facility] [address] [place], [place] [address]    Culture   Final    NO GROWTH 1 DAY Performed at [facility], [place] [address] [place], [place] [address]    Report Status PENDING  Incomplete  Blood culture (routine x 2)     Status: None (Preliminary result)   Collection Time: 04/07/22 12:11 AM   Specimen: BLOOD  Result Value Ref Range Status   Specimen Description   Final    BLOOD BLOOD LEFT ARM Performed at [facility] [address] [place], [place] [address]    Special Requests   Final    BOTTLES DRAWN AEROBIC ONLY Blood Culture adequate volume Performed at [facility] [address] [place],  [address]    Culture   Final    NO GROWTH  1 DAY Performed at [facility],  [place] [address] [place], [place] [address]    Report Status PENDING  Incomplete     Labs: CBC: Recent Labs  Lab 04/05/22 2018 04/06/22 1917 04/07/22 0011 04/07/22 0454 04/08/22 0514  WBC 6.7 11.8* 8.4 6.8 6.2  NEUTROABS 4.4 9.5*  --  6.2  --   HGB 10.9* 11.6* 10.3* 9.9* 9.9*  HCT 35.2* 37.4* 34.3* 32.3* 31.7*  MCV 88.9 90.6 93.0 90.5 88.8  PLT 312 309 279 264 [id number]    Basic Metabolic Panel: Recent Labs  Lab 04/05/22 2018 04/06/22 1917 04/07/22 0011 04/07/22 0454 04/08/22 0514  NA 136 133*  --  134* 134*  K 4.0 4.9  --  4.5 4.0  CL 100 98  --  100 98  CO2 26 25  --  25 26  GLUCOSE 76 146*  --  159* 115*  BUN 20 29*  --  31* 29*  CREATININE 1.31* 2.12* 1.80* 1.50* 1.06  CALCIUM 8.6* 8.6*  --  8.4* 8.3*    Liver Function Tests: Recent Labs  Lab 04/05/22 2018 04/06/22 1917  AST 14* 17  ALT 8 10  ALKPHOS 93 92  BILITOT 0.4 0.7  PROT 7.8 8.1  ALBUMIN 3.6 3.3*    CBG: Recent Labs  Lab 04/07/22 0747 04/07/22 1114 04/07/22 1621 04/07/22 2203 04/08/22 0716  GLUCAP 156* 168* 148* 183* 117*    Hgb A1c Recent Labs    04/07/22 0454  HGBA1C 6.3*     Time coordinating discharge: 35 minutes  SIGNED:  Vernell Leep, MD,  FACP, [place], [place], [facility], [facility]   [facility] Physician Advisor [place]     To contact the attending provider between 7A-7P or the covering provider during after hours 7P-7A, please log into the web site [email or web address] and access using universal [place] password for that web site. If you do not have the password, please call the hospital operator.

## 2022-04-09 ENCOUNTER — Encounter: Payer: 59 | Admitting: Vascular Surgery

## 2022-04-12 LAB — CULTURE, BLOOD (ROUTINE X 2)
Culture: NO GROWTH
Culture: NO GROWTH
Special Requests: ADEQUATE
Special Requests: ADEQUATE

## 2022-04-13 ENCOUNTER — Emergency Department (HOSPITAL_COMMUNITY): Payer: 59

## 2022-04-13 ENCOUNTER — Encounter (HOSPITAL_COMMUNITY): Payer: Self-pay | Admitting: *Deleted

## 2022-04-13 ENCOUNTER — Other Ambulatory Visit: Payer: Self-pay

## 2022-04-13 ENCOUNTER — Observation Stay (HOSPITAL_COMMUNITY)
Admission: EM | Admit: 2022-04-13 | Discharge: 2022-04-14 | Disposition: A | Discharge status: Home / Self Care | Payer: 59 | Attending: Family Medicine | Admitting: Family Medicine

## 2022-04-13 DIAGNOSIS — E872 Acidosis, unspecified: Secondary | ICD-10-CM | POA: Diagnosis present

## 2022-04-13 DIAGNOSIS — I5020 Unspecified systolic (congestive) heart failure: Secondary | ICD-10-CM | POA: Diagnosis present

## 2022-04-13 DIAGNOSIS — Z79899 Other long term (current) drug therapy: Secondary | ICD-10-CM | POA: Diagnosis not present

## 2022-04-13 DIAGNOSIS — F17210 Nicotine dependence, cigarettes, uncomplicated: Secondary | ICD-10-CM | POA: Diagnosis not present

## 2022-04-13 DIAGNOSIS — N179 Acute kidney failure, unspecified: Secondary | ICD-10-CM | POA: Diagnosis present

## 2022-04-13 DIAGNOSIS — R0602 Shortness of breath: Secondary | ICD-10-CM | POA: Diagnosis not present

## 2022-04-13 DIAGNOSIS — Z7902 Long term (current) use of antithrombotics/antiplatelets: Secondary | ICD-10-CM | POA: Insufficient documentation

## 2022-04-13 DIAGNOSIS — Z8701 Personal history of pneumonia (recurrent): Secondary | ICD-10-CM

## 2022-04-13 DIAGNOSIS — R7989 Other specified abnormal findings of blood chemistry: Secondary | ICD-10-CM | POA: Diagnosis present

## 2022-04-13 DIAGNOSIS — Z7901 Long term (current) use of anticoagulants: Secondary | ICD-10-CM | POA: Diagnosis not present

## 2022-04-13 DIAGNOSIS — E119 Type 2 diabetes mellitus without complications: Secondary | ICD-10-CM

## 2022-04-13 DIAGNOSIS — Z7984 Long term (current) use of oral hypoglycemic drugs: Secondary | ICD-10-CM | POA: Insufficient documentation

## 2022-04-13 DIAGNOSIS — I2510 Atherosclerotic heart disease of native coronary artery without angina pectoris: Secondary | ICD-10-CM | POA: Diagnosis not present

## 2022-04-13 DIAGNOSIS — I110 Hypertensive heart disease with heart failure: Secondary | ICD-10-CM | POA: Diagnosis not present

## 2022-04-13 DIAGNOSIS — Z1152 Encounter for screening for COVID-19: Secondary | ICD-10-CM | POA: Insufficient documentation

## 2022-04-13 DIAGNOSIS — Z8673 Personal history of transient ischemic attack (TIA), and cerebral infarction without residual deficits: Secondary | ICD-10-CM | POA: Insufficient documentation

## 2022-04-13 DIAGNOSIS — Z951 Presence of aortocoronary bypass graft: Secondary | ICD-10-CM | POA: Insufficient documentation

## 2022-04-13 DIAGNOSIS — R079 Chest pain, unspecified: Secondary | ICD-10-CM | POA: Diagnosis not present

## 2022-04-13 DIAGNOSIS — Z955 Presence of coronary angioplasty implant and graft: Secondary | ICD-10-CM | POA: Insufficient documentation

## 2022-04-13 DIAGNOSIS — I714 Abdominal aortic aneurysm, without rupture, unspecified: Secondary | ICD-10-CM | POA: Insufficient documentation

## 2022-04-13 DIAGNOSIS — Z86711 Personal history of pulmonary embolism: Secondary | ICD-10-CM | POA: Diagnosis present

## 2022-04-13 DIAGNOSIS — F319 Bipolar disorder, unspecified: Secondary | ICD-10-CM | POA: Diagnosis present

## 2022-04-13 DIAGNOSIS — F1910 Other psychoactive substance abuse, uncomplicated: Secondary | ICD-10-CM | POA: Diagnosis present

## 2022-04-13 DIAGNOSIS — I10 Essential (primary) hypertension: Secondary | ICD-10-CM | POA: Diagnosis present

## 2022-04-13 LAB — RAPID URINE DRUG SCREEN, HOSP PERFORMED
Amphetamines: POSITIVE — AB
Barbiturates: NOT DETECTED
Benzodiazepines: NOT DETECTED
Cocaine: NOT DETECTED
Opiates: NOT DETECTED
Tetrahydrocannabinol: NOT DETECTED

## 2022-04-13 LAB — URINALYSIS, MICROSCOPIC (REFLEX): Bacteria, UA: NONE SEEN

## 2022-04-13 LAB — COMPREHENSIVE METABOLIC PANEL
ALT: 10 U/L (ref 0–44)
AST: 24 U/L (ref 15–41)
Albumin: 3.4 g/dL — ABNORMAL LOW (ref 3.5–5.0)
Alkaline Phosphatase: 92 U/L (ref 38–126)
Anion gap: 13 (ref 5–15)
BUN: 24 mg/dL — ABNORMAL HIGH (ref 8–23)
CO2: 22 mmol/L (ref 22–32)
Calcium: 9.4 mg/dL (ref 8.9–10.3)
Chloride: 98 mmol/L (ref 98–111)
Creatinine, Ser: 1.77 mg/dL — ABNORMAL HIGH (ref 0.61–1.24)
GFR, Estimated: 42 mL/min — ABNORMAL LOW (ref >60)
Glucose, Bld: 170 mg/dL — ABNORMAL HIGH (ref 70–99)
Potassium: 3.9 mmol/L (ref 3.5–5.1)
Sodium: 133 mmol/L — ABNORMAL LOW (ref 135–145)
Total Bilirubin: 0.5 mg/dL (ref 0.3–1.2)
Total Protein: 7.4 g/dL (ref 6.5–8.1)

## 2022-04-13 LAB — TROPONIN I (HIGH SENSITIVITY)
Troponin I (High Sensitivity): 52 ng/L — ABNORMAL HIGH (ref <18)
Troponin I (High Sensitivity): 58 ng/L — ABNORMAL HIGH (ref <18)

## 2022-04-13 LAB — CBC WITH DIFFERENTIAL/PLATELET
Abs Immature Granulocytes: 0.04 10*3/uL (ref 0.00–0.07)
Basophils Absolute: 0 10*3/uL (ref 0.0–0.1)
Basophils Relative: 0 %
Eosinophils Absolute: 0.1 10*3/uL (ref 0.0–0.5)
Eosinophils Relative: 1 %
HCT: 39.6 % (ref 39.0–52.0)
Hemoglobin: 13.4 g/dL (ref 13.0–17.0)
Immature Granulocytes: 0 %
Lymphocytes Relative: 16 %
Lymphs Abs: 1.4 10*3/uL (ref 0.7–4.0)
MCH: 28.8 pg (ref 26.0–34.0)
MCHC: 33.8 g/dL (ref 30.0–36.0)
MCV: 85 fL (ref 80.0–100.0)
Monocytes Absolute: 0.6 10*3/uL (ref 0.1–1.0)
Monocytes Relative: 7 %
Neutro Abs: 7 10*3/uL (ref 1.7–7.7)
Neutrophils Relative %: 76 %
Platelets: 341 10*3/uL (ref 150–400)
RBC: 4.66 MIL/uL (ref 4.22–5.81)
RDW: 16.3 % — ABNORMAL HIGH (ref 11.5–15.5)
WBC: 9.2 10*3/uL (ref 4.0–10.5)
nRBC: 0 % (ref 0.0–0.2)

## 2022-04-13 LAB — URINALYSIS, ROUTINE W REFLEX MICROSCOPIC
Bilirubin Urine: NEGATIVE
Glucose, UA: NEGATIVE mg/dL
Hgb urine dipstick: NEGATIVE
Ketones, ur: NEGATIVE mg/dL
Leukocytes,Ua: NEGATIVE
Nitrite: NEGATIVE
Protein, ur: 30 mg/dL — AB
Specific Gravity, Urine: 1.02 (ref 1.005–1.030)
pH: 5.5 (ref 5.0–8.0)

## 2022-04-13 LAB — LACTIC ACID, PLASMA
Lactic Acid, Venous: 3 mmol/L (ref 0.5–1.9)
Lactic Acid, Venous: 2 mmol/L (ref 0.5–1.9)
Lactic Acid, Venous: 1.8 mmol/L (ref 0.5–1.9)

## 2022-04-13 LAB — GLUCOSE, CAPILLARY: Glucose-Capillary: 113 mg/dL — ABNORMAL HIGH (ref 70–99)

## 2022-04-13 LAB — CK: Total CK: 177 U/L (ref 49–397)

## 2022-04-13 LAB — PROTIME-INR
INR: 1 (ref 0.8–1.2)
Prothrombin Time: 13.5 seconds (ref 11.4–15.2)

## 2022-04-13 LAB — RESP PANEL BY RT-PCR (RSV, FLU A&B, COVID)  RVPGX2
Influenza A by PCR: NEGATIVE
Influenza B by PCR: NEGATIVE
Resp Syncytial Virus by PCR: NEGATIVE
SARS Coronavirus 2 by RT PCR: NEGATIVE

## 2022-04-13 LAB — BRAIN NATRIURETIC PEPTIDE: B Natriuretic Peptide: 29.6 pg/mL (ref 0.0–100.0)

## 2022-04-13 MED ORDER — FAMOTIDINE 20 MG PO TABS: 20.0000 mg | ORAL_TABLET | Freq: Every day | ORAL | Status: DC | PRN

## 2022-04-13 MED ORDER — LACTATED RINGERS IV BOLUS (SEPSIS)
1000.0000 mL | Freq: Once | INTRAVENOUS | Status: AC
  Administered 2022-04-13: 1000 mL via INTRAVENOUS

## 2022-04-13 MED ORDER — SODIUM CHLORIDE 0.9 % IV SOLN
1.0000 g | Freq: Once | INTRAVENOUS | Status: AC
  Administered 2022-04-13: 1 g via INTRAVENOUS
  Filled 2022-04-13: qty 10

## 2022-04-13 MED ORDER — APIXABAN 5 MG PO TABS
5.0000 mg | ORAL_TABLET | Freq: Two times a day (BID) | ORAL | Status: DC
  Administered 2022-04-13 – 2022-04-14 (×2): 5 mg via ORAL
  Filled 2022-04-13 (×2): qty 1

## 2022-04-13 MED ORDER — TRAMADOL HCL 50 MG PO TABS
50.0000 mg | ORAL_TABLET | Freq: Four times a day (QID) | ORAL | Status: DC | PRN
  Administered 2022-04-13 – 2022-04-14 (×2): 50 mg via ORAL
  Filled 2022-04-13 (×2): qty 1

## 2022-04-13 MED ORDER — CLOPIDOGREL BISULFATE 75 MG PO TABS
75.0000 mg | ORAL_TABLET | Freq: Every day | ORAL | Status: DC
  Administered 2022-04-13 – 2022-04-14 (×2): 75 mg via ORAL
  Filled 2022-04-13 (×2): qty 1

## 2022-04-13 MED ORDER — SODIUM CHLORIDE 0.9 % IV BOLUS (SEPSIS)
1000.0000 mL | Freq: Once | INTRAVENOUS | Status: AC
  Administered 2022-04-13: 1000 mL via INTRAVENOUS

## 2022-04-13 MED ORDER — VANCOMYCIN HCL 2000 MG/400ML IV SOLN
2000.0000 mg | Freq: Once | INTRAVENOUS | Status: AC
  Administered 2022-04-13: 2000 mg via INTRAVENOUS
  Filled 2022-04-13: qty 400

## 2022-04-13 MED ORDER — CARVEDILOL 6.25 MG PO TABS
6.2500 mg | ORAL_TABLET | Freq: Two times a day (BID) | ORAL | Status: DC
  Administered 2022-04-13 – 2022-04-14 (×2): 6.25 mg via ORAL
  Filled 2022-04-13 (×2): qty 1

## 2022-04-13 MED ORDER — SODIUM CHLORIDE 0.9 % IV SOLN
500.0000 mg | Freq: Once | INTRAVENOUS | Status: AC
  Administered 2022-04-13: 500 mg via INTRAVENOUS
  Filled 2022-04-13: qty 5

## 2022-04-13 MED ORDER — TIZANIDINE HCL 2 MG PO TABS
4.0000 mg | ORAL_TABLET | Freq: Two times a day (BID) | ORAL | Status: DC | PRN
  Administered 2022-04-14: 4 mg via ORAL
  Filled 2022-04-13: qty 2

## 2022-04-13 MED ORDER — ALBUTEROL SULFATE (2.5 MG/3ML) 0.083% IN NEBU: 2.5000 mg | INHALATION_SOLUTION | RESPIRATORY_TRACT | Status: DC | PRN

## 2022-04-13 MED ORDER — IPRATROPIUM-ALBUTEROL 0.5-2.5 (3) MG/3ML IN SOLN
3.0000 mL | Freq: Once | RESPIRATORY_TRACT | Status: AC
  Administered 2022-04-13: 3 mL via RESPIRATORY_TRACT
  Filled 2022-04-13: qty 3

## 2022-04-13 MED ORDER — SENNOSIDES-DOCUSATE SODIUM 8.6-50 MG PO TABS: 2.0000 | ORAL_TABLET | Freq: Every day | ORAL | Status: DC | PRN

## 2022-04-13 MED ORDER — SERTRALINE HCL 50 MG PO TABS
75.0000 mg | ORAL_TABLET | Freq: Every day | ORAL | Status: DC
  Administered 2022-04-13 – 2022-04-14 (×2): 75 mg via ORAL
  Filled 2022-04-13 (×2): qty 1

## 2022-04-13 MED ORDER — THIAMINE MONONITRATE 100 MG PO TABS
100.0000 mg | ORAL_TABLET | Freq: Every day | ORAL | Status: DC
  Administered 2022-04-13 – 2022-04-14 (×2): 100 mg via ORAL
  Filled 2022-04-13 (×2): qty 1

## 2022-04-13 MED ORDER — ROSUVASTATIN CALCIUM 20 MG PO TABS
20.0000 mg | ORAL_TABLET | Freq: Every day | ORAL | Status: DC
  Administered 2022-04-13: 20 mg via ORAL
  Filled 2022-04-13: qty 1

## 2022-04-13 MED ORDER — TRAZODONE HCL 50 MG PO TABS
50.0000 mg | ORAL_TABLET | Freq: Every day | ORAL | Status: DC
  Administered 2022-04-13: 50 mg via ORAL
  Filled 2022-04-13: qty 1

## 2022-04-13 MED ORDER — GABAPENTIN 300 MG PO CAPS
600.0000 mg | ORAL_CAPSULE | Freq: Three times a day (TID) | ORAL | Status: DC
  Administered 2022-04-13 – 2022-04-14 (×3): 600 mg via ORAL
  Filled 2022-04-13 (×3): qty 2

## 2022-04-13 MED ORDER — HYDRALAZINE HCL 20 MG/ML IJ SOLN: 10.0000 mg | INTRAMUSCULAR | Status: DC | PRN

## 2022-04-13 MED ORDER — INSULIN ASPART 100 UNIT/ML IJ SOLN: 0.0000 [IU] | Freq: Three times a day (TID) | INTRAMUSCULAR | Status: DC

## 2022-04-13 MED ORDER — IOHEXOL 350 MG/ML SOLN
60.0000 mL | Freq: Once | INTRAVENOUS | Status: AC | PRN
  Administered 2022-04-13: 60 mL via INTRAVENOUS

## 2022-04-13 NOTE — Progress Notes (Signed)
Patient refused stat  blood work. Patient education provided. MD notified

## 2022-04-13 NOTE — ED Notes (Signed)
Patient transported to CT 

## 2022-04-13 NOTE — Progress Notes (Signed)
[facility] following for sepsis protocol. 

## 2022-04-13 NOTE — ED Notes (Signed)
Courtesy call completed!

## 2022-04-13 NOTE — ED Provider Notes (Signed)
[facility] Provider Note   CSN: [id number] Arrival date & time: 04/13/22  0436     History  Chief Complaint  Patient presents with   Chest Pain    Grantlee Slankard is a 67 y.o. male.  The history is provided by the patient.  Patient with history of diabetes, hypertension, CAD, major depressive disorder, substance use disorder presents with chest pain and shortness of breath.  Patient reports he was up late watching TV when he started having shortness of breath and mild chest tightness.  He reports he was recently admitted for pneumonia but just started the antibiotics because it was delayed being delivered to his house. No fevers or vomiting.  No worsening cough.  Patient reports he is on anticoagulation and has been med compliant Patient reports he smokes cigarettes, smokes marijuana and smoked crystal methamphetamine about 4 days ago    Past Medical History:  Diagnosis Date   Depression    Diabetes mellitus without complication ([place])    Hypertension    Stroke ([place])     Home Medications Prior to Admission medications   Medication Sig Start Date End Date Taking? Authorizing Provider  albuterol (VENTOLIN HFA) 108 (90 Base) MCG/ACT inhaler Inhale 2 puffs into the lungs every 4 (four) hours as needed for wheezing or shortness of breath.   Yes [provider]  amoxicillin-clavulanate (AUGMENTIN) 875-125 MG tablet Take 1 tablet by mouth 2 (two) times daily for 5 days. 04/08/22 04/13/22 Yes Hongalgi, Lenis Dickinson, MD  apixaban (ELIQUIS) 5 MG TABS tablet Take 1 tablet (5 mg total) by mouth 2 (two) times daily. 04/08/22 04/08/23 Yes Hongalgi, Lenis Dickinson, MD  carvedilol (COREG) 6.25 MG tablet TAKE 1 TABLET (6.25 MG TOTAL) BY MOUTH TWO TIMES DAILY WITH A MEAL. Patient taking differently: Take 6.25 mg by mouth 2 (two) times daily with a meal. 03/09/20 04/14/23 Yes Sheikh, Omair Latif, DO  Dulaglutide (TRULICITY) 1.5 0000000 SOPN Inject 1.5 mg  into the skin once a week. Friday   Yes [provider]  famotidine (PEPCID) 20 MG tablet TAKE 1 TABLET (20 MG TOTAL) BY MOUTH TWO TIMES DAILY. Patient taking differently: Take 20 mg by mouth daily as needed for heartburn. 03/09/20 04/14/23 Yes Sheikh, Omair Latif, DO  gabapentin (NEURONTIN) 600 MG tablet Take 1,200 mg by mouth 3 (three) times daily. 04/24/19  Yes [provider]  glipiZIDE (GLUCOTROL XL) 10 MG 24 hr tablet TAKE 1 TABLET (10 MG TOTAL) BY MOUTH DAILY. Patient taking differently: Take 10 mg by mouth daily. 03/09/20 04/14/23 Yes Sheikh, Omair Latif, DO  metFORMIN (GLUCOPHAGE) 1000 MG tablet Take 1,000 mg by mouth 2 (two) times daily with a meal.   Yes [provider]  polyethylene glycol (MIRALAX / GLYCOLAX) 17 g packet Take 17 g by mouth daily as needed for mild constipation.   Yes [provider]  rosuvastatin (CRESTOR) 20 MG tablet Take 20 mg by mouth at bedtime. 04/24/19  Yes [provider]  sacubitril-valsartan (ENTRESTO) 24-26 MG Take 1 tablet by mouth 2 (two) times daily.   Yes [provider]  senna-docusate (SENOKOT-S) 8.6-50 MG tablet Take 2 tablets by mouth daily as needed for mild constipation.   Yes [provider]  sertraline (ZOLOFT) 50 MG tablet Take 75 mg by mouth daily. Taking 1 & 1/2 tabs daily = 75 mg 7 DS delivered on 04-01-22   Yes [provider]  thiamine (VITAMIN B-1) 100 MG tablet Take 100  mg by mouth daily.   Yes [provider]  tiZANidine (ZANAFLEX) 4 MG capsule Take 4 mg by mouth 2 (two) times daily as needed for muscle spasms.   Yes [provider]  traMADol (ULTRAM) 50 MG tablet Take 50 mg by mouth every 6 (six) hours as needed for moderate pain.   Yes [provider]  traZODone (DESYREL) 50 MG tablet Take 50 mg by mouth at bedtime.   Yes [provider]  blood glucose meter kit and supplies KIT Dispense based on patient and insurance preference. Use up  to four times daily as directed. (FOR ICD-9 250.00, 250.01). 03/09/20   Raiford Noble Latif, DO  clopidogrel (PLAVIX) 75 MG tablet Take 75 mg by mouth daily.    [provider]      Allergies    Patient has no known allergies.    Review of Systems   Review of Systems  Respiratory:  Positive for shortness of breath.   Cardiovascular:  Positive for chest pain.    Physical Exam Updated Vital Signs BP (!) 138/113   Pulse (!) 112   Temp 97.7 F (36.5 C) (Oral)   Resp 13   Ht 1.778 m (5' 10"$ )   Wt 95.3 kg   SpO2 99%   BMI 30.13 kg/m  Physical Exam CONSTITUTIONAL: Disheveled, appears older than stated age HEAD: Normocephalic/atraumatic EYES: EOMI/PERRL ENMT: Mucous membranes moist, extremely poor dentition NECK: supple no meningeal signs SPINE/BACK:entire spine nontender CV: S1/S2 noted, tachycardic LUNGS: Wheezing bilaterally ABDOMEN: soft, nontender, chronic umbilical hernia noted that is nontender and easily reducible NEURO: Pt is awake/alert/appropriate, moves all extremitiesx4.  No facial droop.   EXTREMITIES: pulses normal/equal, full ROM SKIN: Cool to touch PSYCH: Anxious ED Results / Procedures / Treatments   Labs (all labs ordered are listed, but only abnormal results are displayed) Labs Reviewed  LACTIC ACID, PLASMA - Abnormal; Notable for the following components:      Result Value   Lactic Acid, Venous 3.0 (*)    All other components within normal limits  COMPREHENSIVE METABOLIC PANEL - Abnormal; Notable for the following components:   Sodium 133 (*)    Glucose, Bld 170 (*)    BUN 24 (*)    Creatinine, Ser 1.77 (*)    Albumin 3.4 (*)    GFR, Estimated 42 (*)    All other components within normal limits  CBC WITH DIFFERENTIAL/PLATELET - Abnormal; Notable for the following components:   RDW 16.3 (*)    All other components within normal limits  TROPONIN I (HIGH SENSITIVITY) - Abnormal; Notable for the following components:   Troponin I (High  Sensitivity) 52 (*)    All other components within normal limits  RESP PANEL BY RT-PCR (RSV, FLU A&B, COVID)  [id number]  CULTURE, BLOOD (ROUTINE X 2)  CULTURE, BLOOD (ROUTINE X 2)  URINE CULTURE  PROTIME-INR  BRAIN NATRIURETIC PEPTIDE  LACTIC ACID, PLASMA  PROTIME-INR  APTT  URINALYSIS, ROUTINE W REFLEX MICROSCOPIC  RAPID URINE DRUG SCREEN, HOSP PERFORMED  TROPONIN I (HIGH SENSITIVITY)    EKG EKG Interpretation  Date/Time:  Sunday April 13 2022 04:41:20 EST Ventricular Rate:  121 PR Interval:  132 QRS Duration: 96 QT Interval:  330 QTC Calculation: 468 R Axis:   88 Text Interpretation: Sinus tachycardia T wave abnormality Interpretation limited secondary to artifact Confirmed by Ripley Fraise [phone]) on 04/13/2022 4:58:56 AM  Radiology DG Chest Port 1 View  Result Date: 04/13/2022 CLINICAL DATA:  Sepsis. EXAM: PORTABLE  CHEST 1 VIEW COMPARISON:  04/06/2022 FINDINGS: Previous median sternotomy. Heart size is normal. No pleural effusion or edema. No airspace opacities identified. Degenerative changes identified within the left glenohumeral joint. IMPRESSION: No acute cardiopulmonary abnormalities. Electronically Signed   By: Kerby Moors M.D.   On: 04/13/2022 06:07    Procedures .Critical Care  Performed by: Ripley Fraise, MD Authorized by: Ripley Fraise, MD   Critical care provider statement:    Critical care time (minutes):  60   Critical care start time:  04/13/2022 5:15 AM   Critical care end time:  04/13/2022 6:15 AM   Critical care time was exclusive of:  Separately billable procedures and treating other patients   Critical care was necessary to treat or prevent imminent or life-threatening deterioration of the following conditions:  Respiratory failure, sepsis and cardiac failure   Critical care was time spent personally by me on the following activities:  Examination of patient, evaluation of patient's response to treatment, development of treatment plan with  patient or surrogate, pulse oximetry, re-evaluation of patient's condition, ordering and review of radiographic studies and obtaining history from patient or surrogate   I assumed direction of critical care for this patient from another provider in my specialty: no     Care discussed with: admitting provider       Medications Ordered in ED Medications  cefTRIAXone (ROCEPHIN) 1 g in sodium chloride 0.9 % 100 mL IVPB (has no administration in time range)  azithromycin (ZITHROMAX) 500 mg in sodium chloride 0.9 % 250 mL IVPB (has no administration in time range)  lactated ringers bolus 1,000 mL (has no administration in time range)    And  lactated ringers bolus 1,000 mL (has no administration in time range)    And  lactated ringers bolus 1,000 mL (has no administration in time range)  vancomycin (VANCOREADY) IVPB 2000 mg/400 mL (has no administration in time range)  ipratropium-albuterol (DUONEB) 0.5-2.5 (3) MG/3ML nebulizer solution 3 mL (3 mLs Nebulization Given 04/13/22 0626)  sodium chloride 0.9 % bolus 1,000 mL (1,000 mLs Intravenous New Bag/Given 04/13/22 [id number])    ED Course/ Medical Decision Making/ A&P Clinical Course as of 04/13/22 0718  Sun Apr 13, 2022  0706 Creatinine(!): 1.77 Renal insufficiency [DW]  0707 Glucose(!): 170 Hyperglycemia [DW]  0707 Patient with multiple comorbidities presents with increasing shortness of breath.  Recently admitted for pneumonia, reports he just started his antibiotics.  It is also noted the patient has previous history of pulmonary embolism, but he reports he is taking his anticoagulation.  However patient is a poor historian with known history of substance use disorder and admits to smoking crystal meth.  Patient has been tachycardic, but blood pressure is improved.  He was given neb treatments for his shortness of breath and wheezing. [DW]  [id number]  Initial chest x-ray does not reveal any acute findings.  However due to persistent tachycardia and  shortness of breath, will need to pursue evaluation for recurrent pulmonary embolism.  His lactate is elevated which could indicate occult sepsis.  Blood cultures are pending, IV fluids and antibiotics have been ordered. [DW]  0708 Patient is awake and alert and just reports persistent shortness of breath.  He has no focal abdominal tenderness.  He has chronic wounds throughout his extremities, but no obvious source of infection. [DW]  0708 Signed out to Dr. Regenia Skeeter with imaging and the rest of his labs are pending.  Patient will require repeat admission [DW]    Clinical Course  User Index [DW] Ripley Fraise, MD                             Medical Decision Making Amount and/or Complexity of Data Reviewed Labs: ordered. Decision-making details documented in ED Course. Radiology: ordered.  Risk Prescription drug management. Decision regarding hospitalization.   This patient presents to the ED for concern of shortness of breath, this involves an extensive number of treatment options, and is a complaint that carries with it a high risk of complications and morbidity.  The differential diagnosis includes but is not limited to Acute coronary syndrome, pneumonia, acute pulmonary edema, pneumothorax, acute anemia, pulmonary embolism    Comorbidities that complicate the patient evaluation: Patient's presentation is complicated by their history of CAD, HTN  Social Determinants of Health: Patient's  substance use disorder   increases the complexity of managing their presentation  Additional history obtained: Records reviewed previous admission documents  Lab Tests: I Ordered, and personally interpreted labs.  The pertinent results include: Renal insufficiency, elevated lactate  Imaging Studies ordered: I ordered imaging studies including X-ray chest   I independently visualized and interpreted imaging which showed no acute findings I agree with the radiologist interpretation  Cardiac  Monitoring: The patient was maintained on a cardiac monitor.  I personally viewed and interpreted the cardiac monitor which showed an underlying rhythm of:  sinus tachycardia  Medicines ordered and prescription drug management: I ordered medication including albuterol nebs for wheezing Reevaluation of the patient after these medicines showed that the patient    stayed the same  Critical Interventions:   IV fluids and antibiotics  Reevaluation: After the interventions noted above, I reevaluated the patient and found that they have :stayed the same  Complexity of problems addressed: Patient's presentation is most consistent with  acute presentation with potential threat to life or bodily function  Disposition: After consideration of the diagnostic results and the patient's response to treatment,  I feel that the patent would benefit from admission   .           Final Clinical Impression(s) / ED Diagnoses Final diagnoses:  Shortness of breath    Rx / DC Orders ED Discharge Orders     None         Ripley Fraise, MD 04/13/22 [phone]

## 2022-04-13 NOTE — ED Notes (Signed)
ED TO INPATIENT HANDOFF REPORT  ED Nurse Name and Phone #: Justice Rocher [phone]  S Name/Age/Gender [name] 67 y.o. male Room/Bed: 003C/003C  Code Status   Code Status: Prior  Home/SNF/Other Home Patient oriented to: self, place, time, and situation Is this baseline? Yes   Triage Complete: Triage complete  Chief Complaint Chest pain [R07.9]  Triage Note Patient reports chest pain 5/10 in mid chest at 0300.  He has increased pain with breathing.  Patient HR is 130 and he has inverted Twaves.  He has recent dx of pneumonia on 02/13.  He is currently on augmentin for pneumonia.  Bp is 146/80, oxygen 96%.  Patient skin is warm and dry.  Appears dehydrated.  Cbg [id number], he is on trulicity for DM.  Patient given nitro sl x 1 with no relief in his sx.  Posterior and right sided EKG were same as the left sided EKG.  He has hx of mi and bipass.  He is on plavix and eloquis   Allergies No Known Allergies  Level of Care/Admitting Diagnosis ED Disposition     ED Disposition  Admit   Condition  --   [place]: [place] [[id number]]  Level of Care: Telemetry Medical [104]  May place patient in observation at [facility] or [place] if equivalent level of care is available:: No  Covid Evaluation: Asymptomatic - no recent exposure (last 10 days) testing not required  Diagnosis: Chest pain [id number]  Admitting Physician: Norval Morton [id number]  Attending Physician: Norval Morton [id number]          B Medical/Surgery History Past Medical History:  Diagnosis Date   Depression    Diabetes mellitus without complication ([place])    Hypertension    Stroke [facility])    Past Surgical History:  Procedure Laterality Date   ABDOMINAL AORTOGRAM W/LOWER EXTREMITY Bilateral 05/02/2019   Procedure: ABDOMINAL AORTOGRAM W/LOWER EXTREMITY;  Surgeon: Waynetta Sandy, MD;  Location: [place] CV LAB;  Service: Cardiovascular;  Laterality: Bilateral;    CORONARY STENT INTERVENTION N/A 03/05/2020   Procedure: CORONARY STENT INTERVENTION;  Surgeon: Nelva Bush, MD;  Location: [place] [facility];  Service: Cardiovascular;  Laterality: N/A;   LEFT HEART CATH AND CORS/GRAFTS ANGIOGRAPHY N/A 03/05/2020   Procedure: LEFT HEART CATH AND CORS/GRAFTS ANGIOGRAPHY;  Surgeon: Nelva Bush, MD;  Location: [place] [facility];  Service: Cardiovascular;  Laterality: N/A;     A IV Location/Drains/Wounds Patient Lines/Drains/Airways Status     Active Line/Drains/Airways     Name Placement date Placement time Site Days   Peripheral IV 04/13/22 20 G Left Antecubital 04/13/22  0531  Antecubital  less than 1   Pressure Injury 04/06/22 Coccyx Stage 2 -  Partial thickness loss of dermis presenting as a shallow open injury with a red, pink wound bed without slough. Broken skin, redness 04/06/22  0008  -- 7   Wound / Incision (Open or Dehisced) 04/06/22 Diabetic ulcer Tibial Distal;Left;Posterior;Right;Upper;Lower Ulcers scattered over left lower extremity 04/06/22  0008  Tibial  7   Wound / Incision (Open or Dehisced) 04/06/22 Diabetic ulcer Tibial Posterior;Right scabed ulcer 04/06/22  0008  Tibial  7   Wound / Incision (Open or Dehisced) 04/06/22 Puncture Heel Left puncture hole, redness 04/06/22  0008  Heel  7   Wound / Incision (Open or Dehisced) 04/06/22 Tibial Left;Posterior 04/06/22  0008  Tibial  7   Wound / Incision (Open or Dehisced) 04/06/22 Diabetic ulcer  Pretibial Right scabed 04/06/22  0008  Pretibial  7   Wound / Incision (Open or Dehisced) 04/06/22 Diabetic ulcer Pretibial Left Scabed and open area 04/06/22  0008  Pretibial  7            Intake/Output Last 24 hours  Intake/Output Summary (Last 24 hours) at 04/13/2022 1446 Last data filed at 04/13/2022 1213 Gross per 24 hour  Intake --  Output 900 ml  Net -900 ml    Labs/Imaging Results for orders placed or performed during the hospital encounter of 04/13/22 (from the past 48  hour(s))  Comprehensive metabolic panel     Status: Abnormal   Collection Time: 04/13/22  5:17 AM  Result Value Ref Range   Sodium 133 (L) 135 - 145 mmol/L   Potassium 3.9 3.5 - 5.1 mmol/L   Chloride 98 98 - 111 mmol/L   CO2 22 22 - 32 mmol/L   Glucose, Bld 170 (H) 70 - 99 mg/dL    Comment: Glucose reference range applies only to samples taken after fasting for at least 8 hours.   BUN 24 (H) 8 - 23 mg/dL   Creatinine, Ser 1.77 (H) 0.61 - 1.24 mg/dL   Calcium 9.4 8.9 - 10.3 mg/dL   Total Protein 7.4 6.5 - 8.1 g/dL   Albumin 3.4 (L) 3.5 - 5.0 g/dL   AST 24 15 - 41 U/L   ALT 10 0 - 44 U/L   Alkaline Phosphatase 92 38 - 126 U/L   Total Bilirubin 0.5 0.3 - 1.2 mg/dL   GFR, Estimated 42 (L) >60 mL/min    Comment: (NOTE) Calculated using the CKD-EPI Creatinine Equation (2021)    Anion gap 13 5 - 15    Comment: Performed at [facility] [address] [place], [place] [address]  CBC with Differential     Status: Abnormal   Collection Time: 04/13/22  5:17 AM  Result Value Ref Range   WBC 9.2 4.0 - 10.5 K/uL   RBC 4.66 4.22 - 5.81 MIL/uL   Hemoglobin 13.4 13.0 - 17.0 g/dL   HCT 39.6 39.0 - 52.0 %   MCV 85.0 80.0 - 100.0 fL   MCH 28.8 26.0 - 34.0 pg   MCHC 33.8 30.0 - 36.0 g/dL   RDW 16.3 (H) 11.5 - 15.5 %   Platelets 341 150 - 400 K/uL    Comment: REPEATED TO VERIFY   nRBC 0.0 0.0 - 0.2 %   Neutrophils Relative % 76 %   Neutro Abs 7.0 1.7 - 7.7 K/uL   Lymphocytes Relative 16 %   Lymphs Abs 1.4 0.7 - 4.0 K/uL   Monocytes Relative 7 %   Monocytes Absolute 0.6 0.1 - 1.0 K/uL   Eosinophils Relative 1 %   Eosinophils Absolute 0.1 0.0 - 0.5 K/uL   Basophils Relative 0 %   Basophils Absolute 0.0 0.0 - 0.1 K/uL   Immature Granulocytes 0 %   Abs Immature Granulocytes 0.04 0.00 - 0.07 K/uL    Comment: Performed at [facility] [address] [place], [place] [address]  Protime-INR     Status: None   Collection Time: 04/13/22  5:17 AM  Result Value Ref Range    Prothrombin Time 13.5 11.4 - 15.2 seconds   INR 1.0 0.8 - 1.2    Comment: (NOTE) INR goal varies based on device and disease states. Performed at [facility], [place] [address] [place], [place] [address]   Troponin I (  High Sensitivity)     Status: Abnormal   Collection Time: 04/13/22  5:17 AM  Result Value Ref Range   Troponin I (High Sensitivity) 52 (H) <18 ng/L    Comment: (NOTE) Elevated high sensitivity troponin I (hsTnI) values and significant  changes across serial measurements may suggest ACS but many other  chronic and acute conditions are known to elevate hsTnI results.  Refer to the "Links" section for chest pain algorithms and additional  guidance. Performed at [facility], [place] [address] [place], [place] [address]   Brain natriuretic peptide     Status: None   Collection Time: 04/13/22  5:17 AM  Result Value Ref Range   B Natriuretic Peptide 29.6 0.0 - 100.0 pg/mL    Comment: Performed at [facility] [address], [place], [place] [address]  Lactic acid, plasma     Status: Abnormal   Collection Time: 04/13/22  5:42 AM  Result Value Ref Range   Lactic Acid, Venous 3.0 (HH) 0.5 - 1.9 mmol/L    Comment: CRITICAL RESULT CALLED TO, READ BACK BY AND VERIFIED WITH Ashley Akin RN 04/13/22 [id number] Wiliam Ke Performed at [facility], [place] [address] [place], [place] [address]   Blood Culture (routine x 2)     Status: None (Preliminary result)   Collection Time: 04/13/22  5:42 AM   Specimen: BLOOD  Result Value Ref Range   Specimen Description BLOOD RIGHT ARM    Special Requests      BOTTLES DRAWN AEROBIC AND ANAEROBIC Blood Culture results may not be optimal due to an inadequate volume of blood received in culture bottles   Culture      NO GROWTH < 12 HOURS Performed at [facility], [place] [address], [place], [place] [address]    Report Status PENDING   Blood Culture (routine x 2)     Status: None (Preliminary result)   Collection  Time: 04/13/22  5:42 AM   Specimen: BLOOD LEFT HAND  Result Value Ref Range   Specimen Description BLOOD LEFT HAND    Special Requests      BOTTLES DRAWN AEROBIC AND ANAEROBIC Blood Culture results may not be optimal due to an inadequate volume of blood received in culture bottles   Culture      NO GROWTH < 12 HOURS Performed at [facility], [place] [address] [place],  [address]    Report Status PENDING   Resp panel by RT-PCR (RSV, Flu A&B, Covid) Anterior Nasal Swab     Status: None   Collection Time: 04/13/22  5:42 AM   Specimen: Anterior Nasal Swab  Result Value Ref Range   SARS Coronavirus 2 by RT PCR NEGATIVE NEGATIVE   Influenza A by PCR NEGATIVE NEGATIVE   Influenza B by PCR NEGATIVE NEGATIVE    Comment: (NOTE) The Xpert Xpress SARS-CoV-2/FLU/RSV plus assay is intended as an aid in the diagnosis of influenza from Nasopharyngeal swab specimens and should not be used as a sole basis for treatment. Nasal washings and aspirates are unacceptable for Xpert Xpress SARS-CoV-2/FLU/RSV testing.  Fact Sheet for Patients: EntrepreneurPulse.com.au  Fact Sheet for Healthcare Providers: IncredibleEmployment.be  This test is not yet approved or cleared by the [place] FDA and has been authorized for detection and/or diagnosis of SARS-CoV-2 by FDA under an Emergency Use Authorization (EUA). This EUA will remain in effect (meaning this test can be used) for the duration of the COVID-19 declaration under  Section 564(b)(1) of the Act, 21 U.S.C. section 360bbb-3(b)(1), unless the authorization is terminated or revoked.     Resp Syncytial Virus by PCR NEGATIVE NEGATIVE    Comment: (NOTE) Fact Sheet for Patients: EntrepreneurPulse.com.au  Fact Sheet for Healthcare Providers: IncredibleEmployment.be  This test is not yet approved or cleared by the [place] FDA and has been authorized for  detection and/or diagnosis of SARS-CoV-2 by FDA under an Emergency Use Authorization (EUA). This EUA will remain in effect (meaning this test can be used) for the duration of the COVID-19 declaration under Section 564(b)(1) of the Act, 21 U.S.C. section 360bbb-3(b)(1), unless the authorization is terminated or revoked.  Performed at [facility], [place] [address] [place], [place] [address]   Lactic acid, plasma     Status: Abnormal   Collection Time: 04/13/22  7:32 AM  Result Value Ref Range   Lactic Acid, Venous 2.0 (HH) 0.5 - 1.9 mmol/L    Comment: CRITICAL VALUE NOTED. VALUE IS CONSISTENT WITH PREVIOUSLY REPORTED/CALLED VALUE Performed at [facility], [place] [address] [place], [place] [address]   Troponin I (High Sensitivity)     Status: Abnormal   Collection Time: 04/13/22  7:32 AM  Result Value Ref Range   Troponin I (High Sensitivity) 58 (H) <18 ng/L    Comment: (NOTE) Elevated high sensitivity troponin I (hsTnI) values and significant  changes across serial measurements may suggest ACS but many other  chronic and acute conditions are known to elevate hsTnI results.  Refer to the "Links" section for chest pain algorithms and additional  guidance. Performed at [facility], [place] [address], [place], [place] [address]   CK     Status: None   Collection Time: 04/13/22 12:04 PM  Result Value Ref Range   Total CK 177 49 - 397 U/L    Comment: Performed at [facility], [place] [address], [place], [place] [address]  Lactic acid, plasma     Status: None   Collection Time: 04/13/22 12:04 PM  Result Value Ref Range   Lactic Acid, Venous 1.8 0.5 - 1.9 mmol/L    Comment: Performed at [facility] [address], [place], [place] [address]  Urinalysis, Routine w reflex microscopic -Urine, Clean Catch     Status: Abnormal   Collection Time: 04/13/22 12:08 PM  Result Value Ref Range   Color, Urine YELLOW YELLOW   APPearance CLEAR CLEAR   Specific Gravity,  Urine 1.020 1.005 - 1.030   pH 5.5 5.0 - 8.0   Glucose, UA NEGATIVE NEGATIVE mg/dL   Hgb urine dipstick NEGATIVE NEGATIVE   Bilirubin Urine NEGATIVE NEGATIVE   Ketones, ur NEGATIVE NEGATIVE mg/dL   Protein, ur 30 (A) NEGATIVE mg/dL   Nitrite NEGATIVE NEGATIVE   Leukocytes,Ua NEGATIVE NEGATIVE    Comment: Performed at [facility] [address], [place], [place] [address]  Rapid urine drug screen (hospital performed)     Status: Abnormal   Collection Time: 04/13/22 12:08 PM  Result Value Ref Range   Opiates NONE DETECTED NONE DETECTED   Cocaine NONE DETECTED NONE DETECTED   Benzodiazepines NONE DETECTED NONE DETECTED   Amphetamines POSITIVE (A) NONE DETECTED   Tetrahydrocannabinol NONE DETECTED NONE DETECTED   Barbiturates NONE DETECTED NONE DETECTED    Comment: (NOTE) DRUG SCREEN FOR MEDICAL PURPOSES ONLY.  IF CONFIRMATION IS NEEDED FOR ANY PURPOSE, NOTIFY LAB WITHIN 5 DAYS.  LOWEST DETECTABLE LIMITS FOR URINE DRUG SCREEN Drug  Class                     Cutoff (ng/mL) Amphetamine and metabolites    1000 Barbiturate and metabolites    200 Benzodiazepine                 200 Opiates and metabolites        300 Cocaine and metabolites        300 THC                            50 Performed at [facility], [place] [address] [place], [place] [address]   Urinalysis, Microscopic (reflex)     Status: None   Collection Time: 04/13/22 12:08 PM  Result Value Ref Range   RBC / HPF 0-5 0 - 5 RBC/hpf   WBC, UA 0-5 0 - 5 WBC/hpf   Bacteria, UA NONE SEEN NONE SEEN   Squamous Epithelial / HPF 0-5 0 - 5 /HPF   Mucus PRESENT    Hyaline Casts, UA PRESENT     Comment: Performed at [facility], [place] [address] [place], [place] [address]   CT Angio Chest PE W and/or Wo Contrast  Result Date: 04/13/2022 CLINICAL DATA:  Chest pain and shortness of breath. EXAM: CT ANGIOGRAPHY CHEST WITH CONTRAST TECHNIQUE: Multidetector CT imaging of the chest was performed using the standard  protocol during bolus administration of intravenous contrast. Multiplanar CT image reconstructions and MIPs were obtained to evaluate the vascular anatomy. RADIATION DOSE REDUCTION: This exam was performed according to the departmental dose-optimization program which includes automated exposure control, adjustment of the mA and/or kV according to patient size and/or use of iterative reconstruction technique. CONTRAST:  69m OMNIPAQUE IOHEXOL 350 MG/ML SOLN COMPARISON:  03/06/2020 FINDINGS: Cardiovascular: The heart is normal in size. No pericardial effusion. Fusiform aneurysmal dilatation of the ascending thoracic aorta with maximal measurement of 4.1 cm. Recommend annual imaging followup by CTA or MRA. This recommendation follows 2010 ACCF/AHA/AATS/ACR/ASA/SCA/SCAI/SIR/STS/SVM Guidelines for the Diagnosis and Management of Patients with Thoracic Aortic Disease. Circulation. 2010; 121:[id number] Aortic aneurysm NOS (ICD10-I71.9). This is unchanged when compared to prior study from 2022. The pulmonary arterial tree is well opacified. No filling defects to suggest pulmonary embolism. Stable aortic and coronary artery calcifications. Mediastinum/Nodes: Small scattered mediastinal and hilar lymph nodes are stable. No mass or overt adenopathy. Stable calcified right infrahilar nodes and calcified granuloma in the right lower lobe. The esophagus is unremarkable. Lungs/Pleura: Dependent subpleural atelectasis/edema but no pulmonary infiltrates pleural effusions. No worrisome pulmonary lesions or pulmonary nodules. Upper Abdomen: No significant upper abdominal findings. Stable calcified granulomas in the spleen. No hepatic adrenal gland lesions. Musculoskeletal: No significant bony findings. Stable surgical changes from bypass surgery with median sternotomy wires. Review of the MIP images confirms the above findings. IMPRESSION: 1. No CT findings for pulmonary embolism. 2. Fusiform aneurysmal dilatation of the ascending  thoracic aorta with maximal measurement of 4.1 cm. 3. Stable calcified right infrahilar nodes and calcified granuloma in the right lower lobe and calcified granulomas in the spleen. 4. No worrisome pulmonary lesions or pulmonary nodules. 5. Aortic atherosclerosis. Aortic Atherosclerosis (ICD10-I70.0). Electronically Signed   By: PMarijo SanesM.D.   On: 04/13/2022 09:11   DG Chest Port 1 View  Result Date: 04/13/2022 CLINICAL DATA:  Sepsis. EXAM: PORTABLE CHEST 1 VIEW COMPARISON:  04/06/2022 FINDINGS: Previous median sternotomy. Heart size is normal. No pleural  effusion or edema. No airspace opacities identified. Degenerative changes identified within the left glenohumeral joint. IMPRESSION: No acute cardiopulmonary abnormalities. Electronically Signed   By: Kerby Moors M.D.   On: 04/13/2022 06:07    Pending Labs Unresulted Labs (From admission, onward)     Start     Ordered   04/13/22 0756  Protime-INR  Once,   STAT        04/13/22 0756   04/13/22 0756  APTT  Once,   STAT        04/13/22 0756   04/13/22 0706  Urine Culture (for pregnant, neutropenic or urologic patients or patients with an indwelling urinary catheter)  (Urine Labs)  Once,   URGENT       Question:  Indication  Answer:  Sepsis   04/13/22 0705            Vitals/Pain Today's Vitals   04/13/22 0934 04/13/22 1300 04/13/22 1328 04/13/22 1400  BP:  (!) 180/75  (!) 154/88  Pulse:  99  99  Resp:  17  14  Temp:   98.4 F (36.9 C)   TempSrc:   Oral   SpO2:  100%  100%  Weight:      Height:      PainSc: 4        Isolation Precautions No active isolations  Medications Medications  ipratropium-albuterol (DUONEB) 0.5-2.5 (3) MG/3ML nebulizer solution 3 mL (3 mLs Nebulization Given 04/13/22 0626)  sodium chloride 0.9 % bolus 1,000 mL (0 mLs Intravenous Stopped 04/13/22 0733)  cefTRIAXone (ROCEPHIN) 1 g in sodium chloride 0.9 % 100 mL IVPB (0 g Intravenous Stopped 04/13/22 0803)  azithromycin (ZITHROMAX) 500 mg in  sodium chloride 0.9 % 250 mL IVPB (0 mg Intravenous Stopped 04/13/22 0901)  lactated ringers bolus 1,000 mL (0 mLs Intravenous Stopped 04/13/22 0829)    And  lactated ringers bolus 1,000 mL (0 mLs Intravenous Stopped 04/13/22 0829)    And  lactated ringers bolus 1,000 mL (0 mLs Intravenous Stopped 04/13/22 0833)  vancomycin (VANCOREADY) IVPB 2000 mg/400 mL (0 mg Intravenous Stopped 04/13/22 1035)  iohexol (OMNIPAQUE) 350 MG/ML injection 60 mL (60 mLs Intravenous Contrast Given 04/13/22 0902)    Mobility walks     Focused Assessments Neuro Assessment Handoff Swallow screen pass? Not needed Cardiac Rhythm: Normal sinus rhythm       Neuro Assessment: Exceptions to [facility] Neuro Checks:       Has TPA been given? No If patient is a Neuro Trauma and patient is going to OR before floor call report to [facility] nurse: [phone] or [phone]   R Recommendations: See Admitting Provider Note  Report given to:   Additional Notes: patient came initially with cp and tachycardia. Trop were elevated but think not cardiac per MD x 2. Patient with AKI related to methamphetamine use. Patient being admitted for same

## 2022-04-13 NOTE — ED Triage Notes (Signed)
Patient reports chest pain 5/10 in mid chest at 0300.  He has increased pain with breathing.  Patient HR is 130 and he has inverted Twaves.  He has recent dx of pneumonia on 02/13.  He is currently on augmentin for pneumonia.  Bp is 146/80, oxygen 96%.  Patient skin is warm and dry.  Appears dehydrated.  Cbg [id number], he is on trulicity for DM.  Patient given nitro sl x 1 with no relief in his sx.  Posterior and right sided EKG were same as the left sided EKG.  He has hx of mi and bipass.  He is on plavix and eloquis

## 2022-04-13 NOTE — ED Provider Notes (Signed)
Care transferred to me.  CT is overall reassuring.  Probably this is methamphetamine related causing an AKI.  Will add on a CK but otherwise I think you will need admission.  His troponins are up but flat.  Doubt ACS.  Discussed with Dr. Tamala Julian.   Sherwood Gambler, MD 04/13/22 1052

## 2022-04-13 NOTE — H&P (Signed)
History and Physical    Patient: [name] DOB: [birth year] DOA: 04/13/2022 DOS: the patient was seen and examined on 04/13/2022 PCP: Loura Pardon, MD  Patient coming from: Home  Chief Complaint:  Chief Complaint  Patient presents with   Chest Pain   HPI: [name] is a 67 y.o. male with medical history significant of hypertension, CVA, diabetes mellitus type 2, bipolar disorder, depression, and polysubstance abuse who presented with complaints of chest pain that started at 3 AM this morning.  Pain was substernal and occurred while he was watching TV late last night.  Reported associated symptoms of shortness of breath.  He had just been hospitalized at [facility] from 2/10-2/13 with acute hypoxic respiratory failure secondary to pneumonia and COPD exacerbation with acute kidney injury which resolved with IV fluids.  He was continued on antibiotics at discharge but states that they had just got delivered to his house.  He does report smoking cigarettes, marijuana, and smoking crystal meth approximately 5 days ago.  In the emergency department patient was noted to be afebrile with tachycardia and all other vital signs maintained.  Labs significant for sodium 133, BUN 24, creatinine 1.77, CK1 77, lactic acid  3->2->1.8, and high-sensitivity troponins 52-> 58.  Chest x-ray showed no acute abnormality and urinalysis did not show signs of infection.  CT angiogram of the chest did not note any concern for pulmonary embolism, but did note fusiform aneurysmal dilation of the ascending thoracic aorta maximum diam diameter of 4.1 cm. Urinalysis did not note any acute concern for infection.  UDS positive for meth.   Blood and urine cultures were obtained.  Patient had initially been given a total of 4 L normal saline IV fluids, vancomycin, azithromycin, and Rocephin due to concern for sepsis of unclear source.  Review of Systems: As mentioned in the history of present  illness. All other systems reviewed and are negative. Past Medical History:  Diagnosis Date   Depression    Diabetes mellitus without complication ([place])    Hypertension    Stroke [facility])    Past Surgical History:  Procedure Laterality Date   ABDOMINAL AORTOGRAM W/LOWER EXTREMITY Bilateral 05/02/2019   Procedure: ABDOMINAL AORTOGRAM W/LOWER EXTREMITY;  Surgeon: Waynetta Sandy, MD;  Location: [place] [facility];  Service: Cardiovascular;  Laterality: Bilateral;   CORONARY STENT INTERVENTION N/A 03/05/2020   Procedure: CORONARY STENT INTERVENTION;  Surgeon: Nelva Bush, MD;  Location: [place] [facility];  Service: Cardiovascular;  Laterality: N/A;   LEFT HEART CATH AND CORS/GRAFTS ANGIOGRAPHY N/A 03/05/2020   Procedure: LEFT HEART CATH AND CORS/GRAFTS ANGIOGRAPHY;  Surgeon: Nelva Bush, MD;  Location: [place] [facility];  Service: Cardiovascular;  Laterality: N/A;   Social History:  reports that he has been smoking cigarettes. He has been smoking an average of 1 pack per day. He has never used smokeless tobacco. He reports current alcohol use. He reports current drug use. Drugs: Marijuana, Cocaine, and Methamphetamines.  No Known Allergies  History reviewed. No pertinent family history.  Prior to Admission medications   Medication Sig Start Date End Date Taking? Authorizing Provider  albuterol (VENTOLIN HFA) 108 (90 Base) MCG/ACT inhaler Inhale 2 puffs into the lungs every 4 (four) hours as needed for wheezing or shortness of breath.   Yes [provider]  amoxicillin-clavulanate (AUGMENTIN) 875-125 MG tablet Take 1 tablet by mouth 2 (two) times daily for 5 days. 04/08/22 04/13/22 Yes Hongalgi, Lenis Dickinson, MD  apixaban (ELIQUIS) 5 MG  TABS tablet Take 1 tablet (5 mg total) by mouth 2 (two) times daily. 04/08/22 04/08/23 Yes Hongalgi, Lenis Dickinson, MD  carvedilol (COREG) 6.25 MG tablet TAKE 1 TABLET (6.25 MG TOTAL) BY MOUTH TWO TIMES DAILY WITH A MEAL. Patient taking differently:  Take 6.25 mg by mouth 2 (two) times daily with a meal. 03/09/20 04/14/23 Yes Sheikh, Omair Latif, DO  Dulaglutide (TRULICITY) 1.5 0000000 SOPN Inject 1.5 mg into the skin once a week. Friday   Yes [provider]  famotidine (PEPCID) 20 MG tablet TAKE 1 TABLET (20 MG TOTAL) BY MOUTH TWO TIMES DAILY. Patient taking differently: Take 20 mg by mouth daily as needed for heartburn. 03/09/20 04/14/23 Yes Sheikh, Omair Latif, DO  gabapentin (NEURONTIN) 600 MG tablet Take 1,200 mg by mouth 3 (three) times daily. 04/24/19  Yes [provider]  glipiZIDE (GLUCOTROL XL) 10 MG 24 hr tablet TAKE 1 TABLET (10 MG TOTAL) BY MOUTH DAILY. Patient taking differently: Take 10 mg by mouth daily. 03/09/20 04/14/23 Yes Sheikh, Omair Latif, DO  metFORMIN (GLUCOPHAGE) 1000 MG tablet Take 1,000 mg by mouth 2 (two) times daily with a meal.   Yes [provider]  polyethylene glycol (MIRALAX / GLYCOLAX) 17 g packet Take 17 g by mouth daily as needed for mild constipation.   Yes [provider]  rosuvastatin (CRESTOR) 20 MG tablet Take 20 mg by mouth at bedtime. 04/24/19  Yes [provider]  sacubitril-valsartan (ENTRESTO) 24-26 MG Take 1 tablet by mouth 2 (two) times daily.   Yes [provider]  senna-docusate (SENOKOT-S) 8.6-50 MG tablet Take 2 tablets by mouth daily as needed for mild constipation.   Yes [provider]  sertraline (ZOLOFT) 50 MG tablet Take 75 mg by mouth daily. Taking 1 & 1/2 tabs daily = 75 mg 7 DS delivered on 04-01-22   Yes [provider]  thiamine (VITAMIN B-1) 100 MG tablet Take 100 mg by mouth daily.   Yes [provider]  tiZANidine (ZANAFLEX) 4 MG capsule Take 4 mg by mouth 2 (two) times daily as needed for muscle spasms.   Yes [provider]  traMADol (ULTRAM) 50 MG tablet Take 50 mg by mouth every 6 (six) hours as needed for moderate pain.   Yes [provider]  traZODone (DESYREL) 50 MG tablet Take 50  mg by mouth at bedtime.   Yes [provider]  blood glucose meter kit and supplies KIT Dispense based on patient and insurance preference. Use up to four times daily as directed. (FOR ICD-9 250.00, 250.01). 03/09/20   Raiford Noble Latif, DO  clopidogrel (PLAVIX) 75 MG tablet Take 75 mg by mouth daily.    [provider]    Physical Exam: Vitals:   04/13/22 0540 04/13/22 0600 04/13/22 0836 04/13/22 0836  BP: (!) 141/123 (!) 138/113 125/83   Pulse: (!) 115 (!) 112 (!) 102   Resp: 18 13 20   $ Temp:    97.9 F (36.6 C)  TempSrc:    Oral  SpO2: 100% 99% 100%   Weight:      Height:       Exam  Constitutional: Disheveled adult male who appears to be in some distress Eyes: PERRL, lids and conjunctivae normal ENMT: Mucous membranes are moist.  Poor dentition with multiple dental caries and decay present. Neck: normal, supple  Respiratory: clear to auscultation bilaterally, no wheezing, no crackles.  O2 saturation maintained on room air. Cardiovascular: Regular rate and rhythm, no significant  lower extremity swelling. Abdomen: no tenderness, no masses palpated.   Bowel sounds positive.  Musculoskeletal: no clubbing / cyanosis. No joint deformity upper and lower extremities. Normal muscle tone.  Skin: Multiple scabbed over wounds of the bilateral lower extremities. Neurologic: CN 2-12 grossly intact. Strength 5/5 in all 4.  Psychiatric: Anxious mood.  Alert and oriented to person and place.  Data Reviewed:  EKG noted sinus tachycardia 121 bpm with T wave abnormality.  Reviewed labs and imaging, and pertinent records as noted above in HPI  Assessment and Plan:  Chest pain Elevated troponin CAD s/p CABG  Patient reported having complaints of chest pain.  Heart rates initially noted to be in the 130s with inverted T waves.  High-sensitivity troponins 52->58.  Last cardiac catheterization from 02/2020 noted severe CAD with chronic total occlusions of the proximal LAD and  RCA as well as 99% stenosis of the distal RCA with successful PCI to the distal RCA through SVG to RCA and widely patent LIMA to LAD and SVG to distal RCA.  Possibly secondary to demand in setting of likely recent meth use. -Admit to a telemetry bed -Continue Plavix and statin  Acute kidney injury Creatinine elevated up to 1.77 with BUN 24.  Baseline creatinine previously had been 1.06 on 2/13.  Suspect likely related to patient's use of methamphetamines.  Patient has been given a total of 4 L normal saline IV fluids. -Hold nephrotoxic agents -Recheck kidney function in a.m. and monitor for signs of fluid overload  Lactic acidosis Resolved.  Lactic acid elevated at 3, but trended down to 1.8 after IV fluids.  Secondary to recent methamphetamine use.  Heart failure with reduced EF Patient currently appears to be compensated.  Last echocardiogram noted EF to be 45-50% with grade 1 diastolic dysfunction in [id number]. - Strict I&Os and daily weights -Continue Coreg  Controlled diabetes mellitus type 2, without long-term use of insulin Last hemoglobin A1c 6.3 on 04/17/2022. -Hypoglycemic protocol -Hold metformin and glipizide -CBGs before every meal with sensitive SSI -Adjust regimen as needed  History of pneumonia Patient has been discharged home on amoxicillin, but was given Rocephin, vancomycin, and azithromycin in the ED.  Chest x-ray had showed no acute abnormality.  Will not continue antibiotics at this time  Essential hypertension Blood pressures currently maintained. -Held Entresto due to AKI -Continue Coreg  History of pulmonary embolism -Continue Eliquis  Hyperlipidemia -Continue Crestor  Polysubstance abuse Patient reports that he last smoked methamphetamine 5 days ago.  UDS positive for amphetamines. -Continue to counsel on need of cessation of methamphetamine use and other substances  Bipolar 1 disorder During recent hospitalization patient had noted suicidal ideations  but was cleared by psychiatry prior to discharge. -Continue Zoloft  AAA CT angiogram of the chest significant for a fusiform aneurysmal dilation of the ascending thoracic aorta maximum diam diameter of 4.1 cm. -Recommend outpatient follow-up and  DVT prophylaxis: Eliquis Advance Care Planning:   Code Status: Prior    Consults: None  Family Communication: None requested  Severity of Illness: The appropriate patient status for this patient is OBSERVATION. Observation status is judged to be reasonable and necessary in order to provide the required intensity of service to ensure the patient's safety. The patient's presenting symptoms, physical exam findings, and initial radiographic and laboratory data in the context of their medical condition is felt to place them at decreased risk for further clinical deterioration. Furthermore, it is anticipated that the patient will be medically stable for discharge from  the hospital within 2 midnights of admission.   Author: Norval Morton, MD 04/13/2022 1:11 PM  For on call review [email or web address].

## 2022-04-14 DIAGNOSIS — R079 Chest pain, unspecified: Secondary | ICD-10-CM | POA: Diagnosis not present

## 2022-04-14 LAB — GLUCOSE, CAPILLARY
Glucose-Capillary: 135 mg/dL — ABNORMAL HIGH (ref 70–99)
Glucose-Capillary: 187 mg/dL — ABNORMAL HIGH (ref 70–99)

## 2022-04-14 LAB — BASIC METABOLIC PANEL
Anion gap: 14 (ref 5–15)
BUN: 18 mg/dL (ref 8–23)
CO2: 23 mmol/L (ref 22–32)
Calcium: 8.3 mg/dL — ABNORMAL LOW (ref 8.9–10.3)
Chloride: 101 mmol/L (ref 98–111)
Creatinine, Ser: 1.12 mg/dL (ref 0.61–1.24)
GFR, Estimated: >60 mL/min (ref >60)
Glucose, Bld: 163 mg/dL — ABNORMAL HIGH (ref 70–99)
Potassium: 3.8 mmol/L (ref 3.5–5.1)
Sodium: 138 mmol/L (ref 135–145)

## 2022-04-14 LAB — URINE CULTURE: Culture: NO GROWTH

## 2022-04-14 NOTE — TOC Transition Note (Signed)
Transition of Care [facility]) - CM/SW Discharge Note   Patient Details  Name: [name] MRN: [id number] Date of Birth: [birth year]  [facility] CM/SW Contact:  Levonne Lapping, RN Phone Number: 04/14/2022, 12:26 PM   Clinical Narrative:    Pt will DC to home today. Patient has services from [facility] and they normally provide his transportation., [facility] is closed for the holidays and patient has no other means. Cab voucher given to avoid delay in DC until tomorrow. No additional TOC needs          Patient Goals and CMS Choice      Discharge Placement                         Discharge Plan and Services Additional resources added to the After Visit Summary for                                       Social Determinants of Health (SDOH) Interventions SDOH Screenings   Food Insecurity: No Food Insecurity (04/07/2022)  Housing: Low Risk  (04/07/2022)  Transportation Needs: Unmet Transportation Needs (04/07/2022)  Utilities: Not At Risk (04/07/2022)  Tobacco Use: High Risk (04/13/2022)     Readmission Risk Interventions    04/07/2022    4:22 PM  Readmission Risk Prevention Plan  Transportation Screening Complete  PCP or Specialist Appt within 3-5 Days Complete  HRI or [place] Complete  Social Work Consult for [place] Planning/Counseling Complete  Palliative Care Screening Not Applicable  Medication Review [employer]) Complete

## 2022-04-14 NOTE — Care Management Obs Status (Signed)
[place] NOTIFICATION   Patient Details  Name: [name] MRN: [id number] Date of Birth: 1955-06-22   Medicare Observation Status Notification Given:  Yes    Levonne Lapping, RN 04/14/2022, 11:03 AM

## 2022-04-14 NOTE — Progress Notes (Signed)
Pt refused his blood work despite being educated so many times.

## 2022-04-14 NOTE — Discharge Summary (Signed)
Physician Discharge Summary  Trayon Kania [id number] DOB: 1956-01-22 DOA: 04/13/2022  PCP: Loura Pardon, MD  Admit date: 04/13/2022 Discharge date: 04/14/2022 30 Day Unplanned Readmission Risk Score    Flowsheet Row ED to Hosp-Admission (Discharged) from 04/05/2022 in [facility]  30 Day Unplanned Readmission Risk Score (%) 25.37 Filed at 04/08/2022 1200       This score is the patient's risk of an unplanned readmission within 30 days of being discharged (0 -100%). The score is based on dignosis, age, lab data, medications, orders, and past utilization.   Low:  0-14.9   Medium: 15-21.9   High: 22-29.9   Extreme: 30 and above          Admitted From: Home Disposition: Home  Recommendations for Outpatient Follow-up:  Follow up with PCP in 1-2 weeks Please obtain BMP/CBC in one week Please follow up with your PCP on the following pending results: Unresulted Labs (From admission, onward)     Start     Ordered   04/13/22 0706  Urine Culture (for pregnant, neutropenic or urologic patients or patients with an indwelling urinary catheter)  (Urine Labs)  Once,   URGENT       Question:  Indication  Answer:  Sepsis   04/13/22 [place]: None Equipment/Devices: None  Discharge Condition: Stable CODE STATUS: Full code Diet recommendation: Cardiac  HPI: [name] is a 67 y.o. male with medical history significant of hypertension, CVA, diabetes mellitus type 2, bipolar disorder, depression, and polysubstance abuse who presented with complaints of chest pain that started at 3 AM this morning.  Pain was substernal and occurred while he was watching TV late last night.  Reported associated symptoms of shortness of breath.  He had just been hospitalized at [facility] long from 2/10-2/13 with acute hypoxic respiratory failure secondary to pneumonia and COPD exacerbation with acute kidney injury which resolved with  IV fluids.  He was continued on antibiotics at discharge but states that they had just got delivered to his house.  He does report smoking cigarettes, marijuana, and smoking crystal meth approximately 5 days ago.   In the emergency department patient was noted to be afebrile with tachycardia and all other vital signs maintained.  Labs significant for sodium 133, BUN 24, creatinine 1.77, CK1 77, lactic acid  3->2->1.8, and high-sensitivity troponins 52-> 58.  Chest x-ray showed no acute abnormality and urinalysis did not show signs of infection.  CT angiogram of the chest did not note any concern for pulmonary embolism, but did note fusiform aneurysmal dilation of the ascending thoracic aorta maximum diam diameter of 4.1 cm. Urinalysis did not note any acute concern for infection.  UDS positive for meth.   Blood and urine cultures were obtained.  Patient had initially been given a total of 4 L normal saline IV fluids, vancomycin, azithromycin, and Rocephin due to concern for sepsis of unclear source.  Subjective: Patient seen and examined.  He says that he feels better.  No more chest pain but is complaining of peripheral neuropathy which is chronic complaint.  He is agreeable with the discharge today.  Brief/Interim Summary: Patient was admitted with the following conditions.  Details below.  Chest pain Elevated troponin CAD s/p CABG  Patient reported having complaints of chest pain.  Heart rates initially noted to be in the 130s with inverted T waves.  High-sensitivity troponins  52->58.  Last cardiac catheterization from 02/2020 noted severe CAD with chronic total occlusions of the proximal LAD and RCA as well as 99% stenosis of the distal RCA with successful PCI to the distal RCA through SVG to RCA and widely patent LIMA to LAD and SVG to distal RCA.  Possibly secondary to demand in setting of likely recent meth use.  ACS ruled out.  Patient is chest pain-free now.  Continue Plavix and statin.     Acute kidney injury Creatinine elevated up to 1.77 with BUN 24.  Baseline creatinine previously had been 1.06 on 2/13.  Suspect likely related to patient's use of methamphetamines.  Patient was given IV fluids and kidney function is back to baseline.   Lactic acidosis Resolved with IV fluids.  Likely was secondary to dehydration.   Heart failure with reduced EF Patient currently appears to be compensated.  Last echocardiogram noted EF to be 45-50% with grade 1 diastolic dysfunction in [id number]. - Strict I&Os and daily weights -Continue Coreg   Controlled diabetes mellitus type 2, without long-term use of insulin Last hemoglobin A1c 6.3 on 04/17/2022. Resume home medications.   History of pneumonia Patient has been discharged home on amoxicillin, but was given Rocephin, vancomycin, and azithromycin in the ED.  Chest x-ray had showed no acute abnormality.  Will not continue antibiotics at this time however he may resume the medications that he has received at home.   Essential hypertension Blood pressures currently maintained. -Held Entresto due to AKI but can resume at discharge. -Continue Coreg   History of pulmonary embolism -Continue Eliquis   Hyperlipidemia -Continue Crestor   Polysubstance abuse Patient reports that he last smoked methamphetamine 5 days ago.  UDS positive for amphetamines. Cessation counseling provided.   Bipolar 1 disorder During recent hospitalization patient had noted suicidal ideations but was cleared by psychiatry prior to discharge. -Continue Zoloft   AAA CT angiogram of the chest significant for a fusiform aneurysmal dilation of the ascending thoracic aorta maximum diam diameter of 4.1 cm. -Recommend outpatient follow-up and  Discharge plan was discussed with patient and/or family member and they verbalized understanding and agreed with it.  Discharge Diagnoses:  Principal Problem:   Chest pain Active Problems:   Coronary artery disease involving  native coronary artery of native heart without angina pectoris   Elevated troponin   AKI (acute kidney injury) (HCC)   Lactic acidosis   Heart failure with reduced ejection fraction (HCC)   Controlled type 2 diabetes mellitus without complication, without long-term current use of insulin (HCC)   History of pneumonia   Hypertension   History of pulmonary embolism   Bipolar 1 disorder (HCC)   Polysubstance abuse ([place])   AAA (abdominal aortic aneurysm) ([place])    Discharge Instructions   Allergies as of 04/14/2022   No Known Allergies      Medication List     STOP taking these medications    amoxicillin-clavulanate 875-125 MG tablet Commonly known as: AUGMENTIN       TAKE these medications    albuterol 108 (90 Base) MCG/ACT inhaler Commonly known as: VENTOLIN HFA Inhale 2 puffs into the lungs every 4 (four) hours as needed for wheezing or shortness of breath.   apixaban 5 MG Tabs tablet Commonly known as: ELIQUIS Take 1 tablet (5 mg total) by mouth 2 (two) times daily.   blood glucose meter kit and supplies Kit Dispense based on patient and insurance preference. Use up to four times daily as directed. (  FOR ICD-9 250.00, 250.01).   carvedilol 6.25 MG tablet Commonly known as: COREG TAKE 1 TABLET (6.25 MG TOTAL) BY MOUTH TWO TIMES DAILY WITH A MEAL. What changed: how much to take   clopidogrel 75 MG tablet Commonly known as: PLAVIX Take 75 mg by mouth daily.   Entresto 24-26 MG Generic drug: sacubitril-valsartan Take 1 tablet by mouth 2 (two) times daily.   famotidine 20 MG tablet Commonly known as: PEPCID TAKE 1 TABLET (20 MG TOTAL) BY MOUTH TWO TIMES DAILY. What changed:  how much to take how to take this when to take this reasons to take this   gabapentin 600 MG tablet Commonly known as: NEURONTIN Take 1,200 mg by mouth 3 (three) times daily.   glipiZIDE 10 MG 24 hr tablet Commonly known as: GLUCOTROL XL TAKE 1 TABLET (10 MG TOTAL) BY MOUTH  DAILY. What changed: how much to take   metFORMIN 1000 MG tablet Commonly known as: GLUCOPHAGE Take 1,000 mg by mouth 2 (two) times daily with a meal.   polyethylene glycol 17 g packet Commonly known as: MIRALAX / GLYCOLAX Take 17 g by mouth daily as needed for mild constipation.   rosuvastatin 20 MG tablet Commonly known as: CRESTOR Take 20 mg by mouth at bedtime.   senna-docusate 8.6-50 MG tablet Commonly known as: Senokot-S Take 2 tablets by mouth daily as needed for mild constipation.   sertraline 50 MG tablet Commonly known as: ZOLOFT Take 75 mg by mouth daily. Taking 1 & 1/2 tabs daily = 75 mg 7 DS delivered on 04-01-22   thiamine 100 MG tablet Commonly known as: Vitamin B-1 Take 100 mg by mouth daily.   tiZANidine 4 MG capsule Commonly known as: ZANAFLEX Take 4 mg by mouth 2 (two) times daily as needed for muscle spasms.   traMADol 50 MG tablet Commonly known as: ULTRAM Take 50 mg by mouth every 6 (six) hours as needed for moderate pain.   traZODone 50 MG tablet Commonly known as: DESYREL Take 50 mg by mouth at bedtime.   Trulicity 1.5 0000000 Sopn Generic drug: Dulaglutide Inject 1.5 mg into the skin once a week. Friday        Follow-up Information     Paliwal, Himanshu, MD Follow up in 1 week(s).   Specialty: Family Medicine Contact information: [place] [address] [phone]                No Known Allergies  Consultations: None   Procedures/Studies: CT Angio Chest PE W and/or Wo Contrast  Result Date: 04/13/2022 CLINICAL DATA:  Chest pain and shortness of breath. EXAM: CT ANGIOGRAPHY CHEST WITH CONTRAST TECHNIQUE: Multidetector CT imaging of the chest was performed using the standard protocol during bolus administration of intravenous contrast. Multiplanar CT image reconstructions and MIPs were obtained to evaluate the vascular anatomy. RADIATION DOSE REDUCTION: This exam was performed according to the  departmental dose-optimization program which includes automated exposure control, adjustment of the mA and/or kV according to patient size and/or use of iterative reconstruction technique. CONTRAST:  31m OMNIPAQUE IOHEXOL 350 MG/ML SOLN COMPARISON:  03/06/2020 FINDINGS: Cardiovascular: The heart is normal in size. No pericardial effusion. Fusiform aneurysmal dilatation of the ascending thoracic aorta with maximal measurement of 4.1 cm. Recommend annual imaging followup by CTA or MRA. This recommendation follows 2010 ACCF/AHA/AATS/ACR/ASA/SCA/SCAI/SIR/STS/SVM Guidelines for the Diagnosis and Management of Patients with Thoracic Aortic Disease. Circulation. 2010; 121:[id number] Aortic aneurysm NOS (ICD10-I71.9). This is unchanged when compared to prior study  from 2022. The pulmonary arterial tree is well opacified. No filling defects to suggest pulmonary embolism. Stable aortic and coronary artery calcifications. Mediastinum/Nodes: Small scattered mediastinal and hilar lymph nodes are stable. No mass or overt adenopathy. Stable calcified right infrahilar nodes and calcified granuloma in the right lower lobe. The esophagus is unremarkable. Lungs/Pleura: Dependent subpleural atelectasis/edema but no pulmonary infiltrates pleural effusions. No worrisome pulmonary lesions or pulmonary nodules. Upper Abdomen: No significant upper abdominal findings. Stable calcified granulomas in the spleen. No hepatic adrenal gland lesions. Musculoskeletal: No significant bony findings. Stable surgical changes from bypass surgery with median sternotomy wires. Review of the MIP images confirms the above findings. IMPRESSION: 1. No CT findings for pulmonary embolism. 2. Fusiform aneurysmal dilatation of the ascending thoracic aorta with maximal measurement of 4.1 cm. 3. Stable calcified right infrahilar nodes and calcified granuloma in the right lower lobe and calcified granulomas in the spleen. 4. No worrisome pulmonary lesions or  pulmonary nodules. 5. Aortic atherosclerosis. Aortic Atherosclerosis (ICD10-I70.0). Electronically Signed   By: Marijo Sanes M.D.   On: 04/13/2022 09:11   DG Chest Port 1 View  Result Date: 04/13/2022 CLINICAL DATA:  Sepsis. EXAM: PORTABLE CHEST 1 VIEW COMPARISON:  04/06/2022 FINDINGS: Previous median sternotomy. Heart size is normal. No pleural effusion or edema. No airspace opacities identified. Degenerative changes identified within the left glenohumeral joint. IMPRESSION: No acute cardiopulmonary abnormalities. Electronically Signed   By: Kerby Moors M.D.   On: 04/13/2022 06:07   DG Chest 2 View  Result Date: 04/06/2022 CLINICAL DATA:  Cough, dyspnea EXAM: CHEST - 2 VIEW COMPARISON:  01/10/2022 FINDINGS: Patchy airspace infiltrate is developed within the right lower lobe, possibly infectious in the appropriate clinical setting. The lungs are otherwise clear. No pneumothorax or pleural effusion. Coronary artery bypass grafting has been performed. Pulmonary vascularity is normal. No acute bone abnormality. IMPRESSION: 1. Right lower lobe infiltrate, possibly infectious in the appropriate clinical setting. Electronically Signed   By: Fidela Salisbury M.D.   On: 04/06/2022 19:48   CT Head Wo Contrast  Result Date: 04/06/2022 CLINICAL DATA:  Mental status change EXAM: CT HEAD WITHOUT CONTRAST TECHNIQUE: Contiguous axial images were obtained from the base of the skull through the vertex without intravenous contrast. RADIATION DOSE REDUCTION: This exam was performed according to the departmental dose-optimization program which includes automated exposure control, adjustment of the mA and/or kV according to patient size and/or use of iterative reconstruction technique. COMPARISON:  Head CT 03/09/2022.  MRI brain 10/17/2013. FINDINGS: Brain: No evidence of acute infarction, hemorrhage, hydrocephalus, extra-axial collection or mass lesion/mass effect. Small chronic infarct in the right cerebellum is  unchanged. Vascular: Atherosclerotic calcifications are present within the cavernous internal carotid arteries. Skull: Normal. Negative for fracture or focal lesion. Sinuses/Orbits: No acute finding. Other: None. IMPRESSION: 1. No acute intracranial abnormality. 2. Stable small chronic infarct in the right cerebellum. Electronically Signed   By: Ronney Asters M.D.   On: 04/06/2022 18:59     Discharge Exam: Vitals:   04/14/22 0400 04/14/22 0755  BP: (!) 148/74 (!) 144/91  Pulse:    Resp: 15 17  Temp: 97.6 F (36.4 C)   SpO2:  97%   Vitals:   04/13/22 2000 04/14/22 0000 04/14/22 0400 04/14/22 0755  BP: 136/81 136/85 (!) 148/74 (!) 144/91  Pulse: 97     Resp: 16 13 15 17  $ Temp: 98.1 F (36.7 C) 98 F (36.7 C) 97.6 F (36.4 C)   TempSrc: Oral Oral Oral   SpO2:  98% 98%  97%  Weight:      Height:        General: Pt is alert, awake, not in acute distress Cardiovascular: RRR, S1/S2 +, no rubs, no gallops Respiratory: CTA bilaterally, no wheezing, no rhonchi Abdominal: Soft, NT, ND, bowel sounds + Extremities: no edema, no cyanosis    The results of significant diagnostics from this hospitalization (including imaging, microbiology, ancillary and laboratory) are listed below for reference.     Microbiology: Recent Results (from the past 240 hour(s))  Resp panel by RT-PCR (RSV, Flu A&B, Covid) Anterior Nasal Swab     Status: None   Collection Time: 04/05/22  9:38 PM   Specimen: Anterior Nasal Swab  Result Value Ref Range Status   SARS Coronavirus 2 by RT PCR NEGATIVE NEGATIVE Final    Comment: (NOTE) SARS-CoV-2 target nucleic acids are NOT DETECTED.  The SARS-CoV-2 RNA is generally detectable in upper respiratory specimens during the acute phase of infection. The lowest concentration of SARS-CoV-2 viral copies this assay can detect is 138 copies/mL. A negative result does not preclude SARS-Cov-2 infection and should not be used as the sole basis for treatment or other  patient management decisions. A negative result may occur with  improper specimen collection/handling, submission of specimen other than nasopharyngeal swab, presence of viral mutation(s) within the areas targeted by this assay, and inadequate number of viral copies(<138 copies/mL). A negative result must be combined with clinical observations, patient history, and epidemiological information. The expected result is Negative.  Fact Sheet for Patients:  EntrepreneurPulse.com.au  Fact Sheet for Healthcare Providers:  IncredibleEmployment.be  This test is no t yet approved or cleared by the [place] FDA and  has been authorized for detection and/or diagnosis of SARS-CoV-2 by FDA under an Emergency Use Authorization (EUA). This EUA will remain  in effect (meaning this test can be used) for the duration of the COVID-19 declaration under Section 564(b)(1) of the Act, 21 U.S.C.section 360bbb-3(b)(1), unless the authorization is terminated  or revoked sooner.       Influenza A by PCR NEGATIVE NEGATIVE Final   Influenza B by PCR NEGATIVE NEGATIVE Final    Comment: (NOTE) The Xpert Xpress SARS-CoV-2/FLU/RSV plus assay is intended as an aid in the diagnosis of influenza from Nasopharyngeal swab specimens and should not be used as a sole basis for treatment. Nasal washings and aspirates are unacceptable for Xpert Xpress SARS-CoV-2/FLU/RSV testing.  Fact Sheet for Patients: EntrepreneurPulse.com.au  Fact Sheet for Healthcare Providers: IncredibleEmployment.be  This test is not yet approved or cleared by the [place] FDA and has been authorized for detection and/or diagnosis of SARS-CoV-2 by FDA under an Emergency Use Authorization (EUA). This EUA will remain in effect (meaning this test can be used) for the duration of the COVID-19 declaration under Section 564(b)(1) of the Act, 21 U.S.C. section  360bbb-3(b)(1), unless the authorization is terminated or revoked.     Resp Syncytial Virus by PCR NEGATIVE NEGATIVE Final    Comment: (NOTE) Fact Sheet for Patients: EntrepreneurPulse.com.au  Fact Sheet for Healthcare Providers: IncredibleEmployment.be  This test is not yet approved or cleared by the [place] FDA and has been authorized for detection and/or diagnosis of SARS-CoV-2 by FDA under an Emergency Use Authorization (EUA). This EUA will remain in effect (meaning this test can be used) for the duration of the COVID-19 declaration under Section 564(b)(1) of the Act, 21 U.S.C. section 360bbb-3(b)(1), unless the authorization is terminated or revoked.  Performed at [facility], [place] [address] [place], [place] [address]   Resp panel by RT-PCR (RSV, Flu A&B, Covid) Anterior Nasal Swab     Status: None   Collection Time: 04/06/22  7:25 PM   Specimen: Anterior Nasal Swab  Result Value Ref Range Status   SARS Coronavirus 2 by RT PCR NEGATIVE NEGATIVE Final    Comment: (NOTE) SARS-CoV-2 target nucleic acids are NOT DETECTED.  The SARS-CoV-2 RNA is generally detectable in upper respiratory specimens during the acute phase of infection. The lowest concentration of SARS-CoV-2 viral copies this assay can detect is 138 copies/mL. A negative result does not preclude SARS-Cov-2 infection and should not be used as the sole basis for treatment or other patient management decisions. A negative result may occur with  improper specimen collection/handling, submission of specimen other than nasopharyngeal swab, presence of viral mutation(s) within the areas targeted by this assay, and inadequate number of viral copies(<138 copies/mL). A negative result must be combined with clinical observations, patient history, and epidemiological information. The expected result is Negative.  Fact Sheet for Patients:   EntrepreneurPulse.com.au  Fact Sheet for Healthcare Providers:  IncredibleEmployment.be  This test is no t yet approved or cleared by the [place] FDA and  has been authorized for detection and/or diagnosis of SARS-CoV-2 by FDA under an Emergency Use Authorization (EUA). This EUA will remain  in effect (meaning this test can be used) for the duration of the COVID-19 declaration under Section 564(b)(1) of the Act, 21 U.S.C.section 360bbb-3(b)(1), unless the authorization is terminated  or revoked sooner.       Influenza A by PCR NEGATIVE NEGATIVE Final   Influenza B by PCR NEGATIVE NEGATIVE Final    Comment: (NOTE) The Xpert Xpress SARS-CoV-2/FLU/RSV plus assay is intended as an aid in the diagnosis of influenza from Nasopharyngeal swab specimens and should not be used as a sole basis for treatment. Nasal washings and aspirates are unacceptable for Xpert Xpress SARS-CoV-2/FLU/RSV testing.  Fact Sheet for Patients: EntrepreneurPulse.com.au  Fact Sheet for Healthcare Providers: IncredibleEmployment.be  This test is not yet approved or cleared by the [place] FDA and has been authorized for detection and/or diagnosis of SARS-CoV-2 by FDA under an Emergency Use Authorization (EUA). This EUA will remain in effect (meaning this test can be used) for the duration of the COVID-19 declaration under Section 564(b)(1) of the Act, 21 U.S.C. section 360bbb-3(b)(1), unless the authorization is terminated or revoked.     Resp Syncytial Virus by PCR NEGATIVE NEGATIVE Final    Comment: (NOTE) Fact Sheet for Patients: EntrepreneurPulse.com.au  Fact Sheet for Healthcare Providers: IncredibleEmployment.be  This test is not yet approved or cleared by the [place] FDA and has been authorized for detection and/or diagnosis of SARS-CoV-2 by FDA under an Emergency Use  Authorization (EUA). This EUA will remain in effect (meaning this test can be used) for the duration of the COVID-19 declaration under Section 564(b)(1) of the Act, 21 U.S.C. section 360bbb-3(b)(1), unless the authorization is terminated or revoked.  Performed at [facility], [place] [address] [place], [place] [address]   Blood culture (routine x 2)     Status: None   Collection Time: 04/07/22 12:11 AM   Specimen: BLOOD  Result Value Ref Range Status   Specimen Description   Final    BLOOD BLOOD RIGHT ARM Performed at [place] [address] [place], [place] [address]    Special Requests   Final    BOTTLES DRAWN  AEROBIC ONLY Blood Culture adequate volume Performed at [place] [address] [place], [place] [address]    Culture   Final    NO GROWTH 5 DAYS Performed at [facility], [place] [address], [place], [place] [address]    Report Status 04/12/2022 FINAL  Final  Blood culture (routine x 2)     Status: None   Collection Time: 04/07/22 12:11 AM   Specimen: BLOOD  Result Value Ref Range Status   Specimen Description   Final    BLOOD BLOOD LEFT ARM Performed at [place] [address] [place], [place] [address]    Special Requests   Final    BOTTLES DRAWN AEROBIC ONLY Blood Culture adequate volume Performed at [place] [address], [place], [place] [address]    Culture   Final    NO GROWTH 5 DAYS Performed at [facility], [place] [address], [place], [place] [address]    Report Status 04/12/2022 FINAL  Final  Blood Culture (routine x 2)     Status: None (Preliminary result)   Collection Time: 04/13/22  5:42 AM   Specimen: BLOOD  Result Value Ref Range Status   Specimen Description BLOOD RIGHT ARM  Final   Special Requests   Final    BOTTLES DRAWN AEROBIC AND ANAEROBIC Blood Culture results may not be optimal due to an inadequate volume of  blood received in culture bottles   Culture   Final    NO GROWTH 1 DAY Performed at [facility], [place] [address], [place], [place] [address]    Report Status PENDING  Incomplete  Blood Culture (routine x 2)     Status: None (Preliminary result)   Collection Time: 04/13/22  5:42 AM   Specimen: BLOOD LEFT HAND  Result Value Ref Range Status   Specimen Description BLOOD LEFT HAND  Final   Special Requests   Final    BOTTLES DRAWN AEROBIC AND ANAEROBIC Blood Culture results may not be optimal due to an inadequate volume of blood received in culture bottles   Culture   Final    NO GROWTH 1 DAY Performed at [facility], [place] [address] [place], [place] [address]    Report Status PENDING  Incomplete  Resp panel by RT-PCR (RSV, Flu A&B, Covid) Anterior Nasal Swab     Status: None   Collection Time: 04/13/22  5:42 AM   Specimen: Anterior Nasal Swab  Result Value Ref Range Status   SARS Coronavirus 2 by RT PCR NEGATIVE NEGATIVE Final   Influenza A by PCR NEGATIVE NEGATIVE Final   Influenza B by PCR NEGATIVE NEGATIVE Final    Comment: (NOTE) The Xpert Xpress SARS-CoV-2/FLU/RSV plus assay is intended as an aid in the diagnosis of influenza from Nasopharyngeal swab specimens and should not be used as a sole basis for treatment. Nasal washings and aspirates are unacceptable for Xpert Xpress SARS-CoV-2/FLU/RSV testing.  Fact Sheet for Patients: EntrepreneurPulse.com.au  Fact Sheet for Healthcare Providers: IncredibleEmployment.be  This test is not yet approved or cleared by the [place] FDA and has been authorized for detection and/or diagnosis of SARS-CoV-2 by FDA under an Emergency Use Authorization (EUA). This EUA will remain in effect (meaning this test can be used) for the duration of the COVID-19 declaration under Section 564(b)(1) of the Act, 21 U.S.C. section 360bbb-3(b)(1), unless the authorization is terminated  or revoked.     Resp Syncytial  Virus by PCR NEGATIVE NEGATIVE Final    Comment: (NOTE) Fact Sheet for Patients: EntrepreneurPulse.com.au  Fact Sheet for Healthcare Providers: IncredibleEmployment.be  This test is not yet approved or cleared by the [place] FDA and has been authorized for detection and/or diagnosis of SARS-CoV-2 by FDA under an Emergency Use Authorization (EUA). This EUA will remain in effect (meaning this test can be used) for the duration of the COVID-19 declaration under Section 564(b)(1) of the Act, 21 U.S.C. section 360bbb-3(b)(1), unless the authorization is terminated or revoked.  Performed at [facility], [place] [address] [place], [place] [address]      Labs: BNP (last 3 results) Recent Labs    04/06/22 1917 04/13/22 0517  BNP 39.0 0000000   Basic Metabolic Panel: Recent Labs  Lab 04/08/22 0514 04/13/22 0517 04/14/22 0912  NA 134* 133* 138  K 4.0 3.9 3.8  CL 98 98 101  CO2 26 22 23  $ GLUCOSE 115* 170* 163*  BUN 29* 24* 18  CREATININE 1.06 1.77* 1.12  CALCIUM 8.3* 9.4 8.3*   Liver Function Tests: Recent Labs  Lab 04/13/22 0517  AST 24  ALT 10  ALKPHOS 92  BILITOT 0.5  PROT 7.4  ALBUMIN 3.4*   No results for input(s): "LIPASE", "AMYLASE" in the last 168 hours. No results for input(s): "AMMONIA" in the last 168 hours. CBC: Recent Labs  Lab 04/08/22 0514 04/13/22 0517  WBC 6.2 9.2  NEUTROABS  --  7.0  HGB 9.9* 13.4  HCT 31.7* 39.6  MCV 88.8 85.0  PLT 270 341   Cardiac Enzymes: Recent Labs  Lab 04/13/22 1204  CKTOTAL 177   BNP: Invalid input(s): "POCBNP" CBG: Recent Labs  Lab 04/07/22 1621 04/07/22 2203 04/08/22 0716 04/13/22 2057 04/14/22 0758  GLUCAP 148* 183* 117* 113* 135*   D-Dimer No results for input(s): "DDIMER" in the last 72 hours. Hgb A1c No results for input(s): "HGBA1C" in the last 72 hours. Lipid Profile No results for input(s): "CHOL", "HDL",  "LDLCALC", "TRIG", "CHOLHDL", "LDLDIRECT" in the last 72 hours. Thyroid function studies No results for input(s): "TSH", "T4TOTAL", "T3FREE", "THYROIDAB" in the last 72 hours.  Invalid input(s): "FREET3" Anemia work up No results for input(s): "VITAMINB12", "FOLATE", "FERRITIN", "TIBC", "IRON", "RETICCTPCT" in the last 72 hours. Urinalysis    Component Value Date/Time   COLORURINE YELLOW 04/13/2022 [place] 04/13/2022 1208   LABSPEC 1.020 04/13/2022 1208   PHURINE 5.5 04/13/2022 1208   GLUCOSEU NEGATIVE 04/13/2022 1208   HGBUR NEGATIVE 04/13/2022  04/13/2022 [place] 04/13/2022 1208   PROTEINUR 30 (A) 04/13/2022 1208   UROBILINOGEN 0.2 10/16/2013 0837   NITRITE NEGATIVE 04/13/2022 [place] 04/13/2022 1208   Sepsis Labs Recent Labs  Lab 04/08/22 0514 04/13/22 0517  WBC 6.2 9.2   Microbiology Recent Results (from the past 240 hour(s))  Resp panel by RT-PCR (RSV, Flu A&B, Covid) Anterior Nasal Swab     Status: None   Collection Time: 04/05/22  9:38 PM   Specimen: Anterior Nasal Swab  Result Value Ref Range Status   SARS Coronavirus 2 by RT PCR NEGATIVE NEGATIVE Final    Comment: (NOTE) SARS-CoV-2 target nucleic acids are NOT DETECTED.  The SARS-CoV-2 RNA is generally detectable in upper respiratory specimens during the acute phase of infection. The lowest concentration of SARS-CoV-2 viral copies this assay can detect is 138 copies/mL. A negative result does not preclude SARS-Cov-2 infection and should  not be used as the sole basis for treatment or other patient management decisions. A negative result may occur with  improper specimen collection/handling, submission of specimen other than nasopharyngeal swab, presence of viral mutation(s) within the areas targeted by this assay, and inadequate number of viral copies(<138 copies/mL). A negative result must be combined with clinical  observations, patient history, and epidemiological information. The expected result is Negative.  Fact Sheet for Patients:  EntrepreneurPulse.com.au  Fact Sheet for Healthcare Providers:  IncredibleEmployment.be  This test is no t yet approved or cleared by the [place] FDA and  has been authorized for detection and/or diagnosis of SARS-CoV-2 by FDA under an Emergency Use Authorization (EUA). This EUA will remain  in effect (meaning this test can be used) for the duration of the COVID-19 declaration under Section 564(b)(1) of the Act, 21 U.S.C.section 360bbb-3(b)(1), unless the authorization is terminated  or revoked sooner.       Influenza A by PCR NEGATIVE NEGATIVE Final   Influenza B by PCR NEGATIVE NEGATIVE Final    Comment: (NOTE) The Xpert Xpress SARS-CoV-2/FLU/RSV plus assay is intended as an aid in the diagnosis of influenza from Nasopharyngeal swab specimens and should not be used as a sole basis for treatment. Nasal washings and aspirates are unacceptable for Xpert Xpress SARS-CoV-2/FLU/RSV testing.  Fact Sheet for Patients: EntrepreneurPulse.com.au  Fact Sheet for Healthcare Providers: IncredibleEmployment.be  This test is not yet approved or cleared by the [place] FDA and has been authorized for detection and/or diagnosis of SARS-CoV-2 by FDA under an Emergency Use Authorization (EUA). This EUA will remain in effect (meaning this test can be used) for the duration of the COVID-19 declaration under Section 564(b)(1) of the Act, 21 U.S.C. section 360bbb-3(b)(1), unless the authorization is terminated or revoked.     Resp Syncytial Virus by PCR NEGATIVE NEGATIVE Final    Comment: (NOTE) Fact Sheet for Patients: EntrepreneurPulse.com.au  Fact Sheet for Healthcare Providers: IncredibleEmployment.be  This test is not yet approved or cleared by  the [place] FDA and has been authorized for detection and/or diagnosis of SARS-CoV-2 by FDA under an Emergency Use Authorization (EUA). This EUA will remain in effect (meaning this test can be used) for the duration of the COVID-19 declaration under Section 564(b)(1) of the Act, 21 U.S.C. section 360bbb-3(b)(1), unless the authorization is terminated or revoked.  Performed at [facility], [place] [address] [place], [place] [address]   Resp panel by RT-PCR (RSV, Flu A&B, Covid) Anterior Nasal Swab     Status: None   Collection Time: 04/06/22  7:25 PM   Specimen: Anterior Nasal Swab  Result Value Ref Range Status   SARS Coronavirus 2 by RT PCR NEGATIVE NEGATIVE Final    Comment: (NOTE) SARS-CoV-2 target nucleic acids are NOT DETECTED.  The SARS-CoV-2 RNA is generally detectable in upper respiratory specimens during the acute phase of infection. The lowest concentration of SARS-CoV-2 viral copies this assay can detect is 138 copies/mL. A negative result does not preclude SARS-Cov-2 infection and should not be used as the sole basis for treatment or other patient management decisions. A negative result may occur with  improper specimen collection/handling, submission of specimen other than nasopharyngeal swab, presence of viral mutation(s) within the areas targeted by this assay, and inadequate number of viral copies(<138 copies/mL). A negative result must be combined with clinical observations, patient history, and epidemiological information. The expected result is Negative.  Fact Sheet for Patients:  EntrepreneurPulse.com.au  Fact Sheet for  Healthcare Providers:  IncredibleEmployment.be  This test is no t yet approved or cleared by the [place] and  has been authorized for detection and/or diagnosis of SARS-CoV-2 by FDA under an Emergency Use Authorization (EUA). This EUA will remain  in effect (meaning this  test can be used) for the duration of the COVID-19 declaration under Section 564(b)(1) of the Act, 21 U.S.C.section 360bbb-3(b)(1), unless the authorization is terminated  or revoked sooner.       Influenza A by PCR NEGATIVE NEGATIVE Final   Influenza B by PCR NEGATIVE NEGATIVE Final    Comment: (NOTE) The Xpert Xpress SARS-CoV-2/FLU/RSV plus assay is intended as an aid in the diagnosis of influenza from Nasopharyngeal swab specimens and should not be used as a sole basis for treatment. Nasal washings and aspirates are unacceptable for Xpert Xpress SARS-CoV-2/FLU/RSV testing.  Fact Sheet for Patients: EntrepreneurPulse.com.au  Fact Sheet for Healthcare Providers: IncredibleEmployment.be  This test is not yet approved or cleared by the [place] FDA and has been authorized for detection and/or diagnosis of SARS-CoV-2 by FDA under an Emergency Use Authorization (EUA). This EUA will remain in effect (meaning this test can be used) for the duration of the COVID-19 declaration under Section 564(b)(1) of the Act, 21 U.S.C. section 360bbb-3(b)(1), unless the authorization is terminated or revoked.     Resp Syncytial Virus by PCR NEGATIVE NEGATIVE Final    Comment: (NOTE) Fact Sheet for Patients: EntrepreneurPulse.com.au  Fact Sheet for Healthcare Providers: IncredibleEmployment.be  This test is not yet approved or cleared by the [place] FDA and has been authorized for detection and/or diagnosis of SARS-CoV-2 by FDA under an Emergency Use Authorization (EUA). This EUA will remain in effect (meaning this test can be used) for the duration of the COVID-19 declaration under Section 564(b)(1) of the Act, 21 U.S.C. section 360bbb-3(b)(1), unless the authorization is terminated or revoked.  Performed at [facility], [place] [address], [place], [place] [address]   Blood culture (routine  x 2)     Status: None   Collection Time: 04/07/22 12:11 AM   Specimen: BLOOD  Result Value Ref Range Status   Specimen Description   Final    BLOOD BLOOD RIGHT ARM Performed at [place] [address] [place], [place] [address]    Special Requests   Final    BOTTLES DRAWN AEROBIC ONLY Blood Culture adequate volume Performed at [facility] [address] [place], [place] [address]    Culture   Final    NO GROWTH 5 DAYS Performed at [facility], [place] [address], [place], [place] [address]    Report Status 04/12/2022 FINAL  Final  Blood culture (routine x 2)     Status: None   Collection Time: 04/07/22 12:11 AM   Specimen: BLOOD  Result Value Ref Range Status   Specimen Description   Final    BLOOD BLOOD LEFT ARM Performed at [place] [address] [place], [place] [address]    Special Requests   Final    BOTTLES DRAWN AEROBIC ONLY Blood Culture adequate volume Performed at [place] [address] [place], [place] [address]    Culture   Final    NO GROWTH 5 DAYS Performed at [facility], [place] [address], [place], [place] [address]    Report Status 04/12/2022 FINAL  Final  Blood Culture (routine x 2)     Status: None (  Preliminary result)   Collection Time: 04/13/22  5:42 AM   Specimen: BLOOD  Result Value Ref Range Status   Specimen Description BLOOD RIGHT ARM  Final   Special Requests   Final    BOTTLES DRAWN AEROBIC AND ANAEROBIC Blood Culture results may not be optimal due to an inadequate volume of blood received in culture bottles   Culture   Final    NO GROWTH 1 DAY Performed at [facility], [place] [address] [place], [place] [address]    Report Status PENDING  Incomplete  Blood Culture (routine x 2)     Status: None (Preliminary result)   Collection Time: 04/13/22  5:42 AM   Specimen: BLOOD LEFT HAND  Result Value Ref Range Status   Specimen  Description BLOOD LEFT HAND  Final   Special Requests   Final    BOTTLES DRAWN AEROBIC AND ANAEROBIC Blood Culture results may not be optimal due to an inadequate volume of blood received in culture bottles   Culture   Final    NO GROWTH 1 DAY Performed at [facility], [place] [address], [place], [place] [address]    Report Status PENDING  Incomplete  Resp panel by RT-PCR (RSV, Flu A&B, Covid) Anterior Nasal Swab     Status: None   Collection Time: 04/13/22  5:42 AM   Specimen: Anterior Nasal Swab  Result Value Ref Range Status   SARS Coronavirus 2 by RT PCR NEGATIVE NEGATIVE Final   Influenza A by PCR NEGATIVE NEGATIVE Final   Influenza B by PCR NEGATIVE NEGATIVE Final    Comment: (NOTE) The Xpert Xpress SARS-CoV-2/FLU/RSV plus assay is intended as an aid in the diagnosis of influenza from Nasopharyngeal swab specimens and should not be used as a sole basis for treatment. Nasal washings and aspirates are unacceptable for Xpert Xpress SARS-CoV-2/FLU/RSV testing.  Fact Sheet for Patients: EntrepreneurPulse.com.au  Fact Sheet for Healthcare Providers: IncredibleEmployment.be  This test is not yet approved or cleared by the [place] FDA and has been authorized for detection and/or diagnosis of SARS-CoV-2 by FDA under an Emergency Use Authorization (EUA). This EUA will remain in effect (meaning this test can be used) for the duration of the COVID-19 declaration under Section 564(b)(1) of the Act, 21 U.S.C. section 360bbb-3(b)(1), unless the authorization is terminated or revoked.     Resp Syncytial Virus by PCR NEGATIVE NEGATIVE Final    Comment: (NOTE) Fact Sheet for Patients: EntrepreneurPulse.com.au  Fact Sheet for Healthcare Providers: IncredibleEmployment.be  This test is not yet approved or cleared by the [place] FDA and has been authorized for detection and/or diagnosis of SARS-CoV-2  by FDA under an Emergency Use Authorization (EUA). This EUA will remain in effect (meaning this test can be used) for the duration of the COVID-19 declaration under Section 564(b)(1) of the Act, 21 U.S.C. section 360bbb-3(b)(1), unless the authorization is terminated or revoked.  Performed at [facility], [place] [address] [place],  [address]      Time coordinating discharge: Over 30 minutes  SIGNED:   Darliss Cheney, MD  [facility] 04/14/2022, 11:24 AM *Please note that this is a verbal dictation therefore any spelling or grammatical errors are due to the "[place] One" system interpretation. If 7PM-7AM, please contact night-coverage [email or web address]

## 2022-04-15 ENCOUNTER — Ambulatory Visit (HOSPITAL_COMMUNITY)
Admission: EM | Admit: 2022-04-15 | Discharge: 2022-04-15 | Disposition: A | Discharge status: Home / Self Care | Payer: 59 | Attending: Psychiatry | Admitting: Psychiatry

## 2022-04-15 DIAGNOSIS — Z046 Encounter for general psychiatric examination, requested by authority: Secondary | ICD-10-CM | POA: Insufficient documentation

## 2022-04-15 DIAGNOSIS — E119 Type 2 diabetes mellitus without complications: Secondary | ICD-10-CM | POA: Insufficient documentation

## 2022-04-15 DIAGNOSIS — I10 Essential (primary) hypertension: Secondary | ICD-10-CM | POA: Insufficient documentation

## 2022-04-15 DIAGNOSIS — F1510 Other stimulant abuse, uncomplicated: Secondary | ICD-10-CM

## 2022-04-15 DIAGNOSIS — R440 Auditory hallucinations: Secondary | ICD-10-CM | POA: Diagnosis not present

## 2022-04-15 DIAGNOSIS — F1911 Other psychoactive substance abuse, in remission: Secondary | ICD-10-CM | POA: Diagnosis not present

## 2022-04-15 DIAGNOSIS — F17210 Nicotine dependence, cigarettes, uncomplicated: Secondary | ICD-10-CM | POA: Insufficient documentation

## 2022-04-15 DIAGNOSIS — E785 Hyperlipidemia, unspecified: Secondary | ICD-10-CM | POA: Insufficient documentation

## 2022-04-15 DIAGNOSIS — I2510 Atherosclerotic heart disease of native coronary artery without angina pectoris: Secondary | ICD-10-CM | POA: Diagnosis not present

## 2022-04-15 DIAGNOSIS — Z8673 Personal history of transient ischemic attack (TIA), and cerebral infarction without residual deficits: Secondary | ICD-10-CM | POA: Insufficient documentation

## 2022-04-15 DIAGNOSIS — R630 Anorexia: Secondary | ICD-10-CM | POA: Insufficient documentation
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
## 2022-04-15 NOTE — ED Provider Notes (Signed)
Behavioral Health Urgent Care Medical Screening Exam  Patient Name: [name] MRN: [id number] Date of Evaluation: 04/15/22 Chief Complaint: "meth" Diagnosis:  Final diagnoses:  Methamphetamine abuse ([place])   History of Present illness: [name] is a 67 y.o. male. Pt presents to [facility] behavioral health under IVC with police escort. Pt is assessed face-to-face by nurse practitioner.   IVC petitioner is Worthy Keeler, ACT Counselor. Per IVC petitioner: PETITIONER STATES: THE RESPONDENT HAS BEEN DIAGNOSED WITH A MENTAL ILLNESS. THE RESPONDENT HAS NOT BEEN SLEEPING, EATING NOR TENDING TO HIS PERSONAL HYGIENE. THE RESPONDENT IS HEARING VOICES. THE RESPONDENT STATES, "IF THEY ARE GOING TO KILL ME I MIGHT AS WELL DO IT MYSELF." THE RESPONDENT BELIEVES HE HEARS PEOPLE TALKING ABOUT KILLING HIM, THE AUDITORY HALLUCINATIONS ARE COMING FROM OUTSIDE OF HIS BEDROOM WINDOW. IN ADDITION, THE RESPONDENT IS ABUSING METHAMPHETAMINES."  [name], 67 y.o., male patient seen face to face by this provider, consulted with Dr. Leverne Humbles; and chart reviewed on 04/15/22. Per chart review, pt with medical history of HTN, HLD, CAD, CVA, DM II. Pt w/ psychiatric history of polysubstance abuse, GAD, MDD. He has had 2 ED to hospital admissions this month 04/06/22-04/08/22, and 04/13/22 - 04/14/22. Pt was discharged home yesterday. He has had recent psychiatric evaluations, including on 04/07/22 (psych cleared following report of thoughts of "killing himself") and 04/06/22 (psych cleared following report of suicidal ideations).  On evaluation, when asked reason for presenting today, [name] reports "meth". Pt reports he has cut down on meth use from 3 times a week to 1 to 2 times a week. He reports he has also cut down from 1g per occasion to 0.25g per occasion. He states his last use of meth was yesterday or the day before yesterday. He reports he uses cigarettes daily, 1 ppd, last used  cigarette today. He reports use of marijuana "if I can afford it", 3 or more times a week, "a couple joints" per occasion, last use was yesterday. He denies use of alcohol or other substances. Provided psychoeducation on substance use and reviewed recommendation to not use. Pt verbalized understanding although reports he is not ready to quit yet. He reports he started using meth 2 years ago due to a "lady".   Pt reports euthymic mood. His affect is flat. He denies suicidal, homicidal or violent ideations. He reports chronic auditory hallucinations for the past 6 to 7 months telling him they are going to kill him. He is insightful, recognizes that hallucinations are likely related to his substance use. He states they occur around his use of meth. He denies visual hallucinations. He reports poor appetite, eating 1 meal/day. He reports poor sleep, sleeping 4 to 5 hours/night.   Pt denies history of non suicidal self injurious behavior, suicide attempt.  He denies access to a firearm or other weapon.  He reports positive family psychiatric history. He reports his father was an "alcoholic" and died by suicide when he was 67 y/o. He reports he does not know about family medical history.   Pt reports he is followed by ACT team. He last saw ACT team staff today. When asked about the statement reported in the IVC petition, pt states this was a misunderstanding and that he has no intention of hurting himself or anyone else.   He reports is living in [place] in a house. He states his ex girlfriend's daughter has been staying with him for the past couple of months. He reports he  is currently unemployed. 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 He reports he receives disability. He reports highest level of education is the 12th grade.  Discussed w/ pt his interest in substance use treatment, offered admission to [facility]. Pt declined admission, states he is not ready to stop using substances at this time.    Pt's ACT team is with [facility], [phone]. Spoke w/ staff who provided contact information for IVC petitioner, Worthy Keeler, [phone]. Per Barnabas Lister, he went to see pt today following hospital discharge yesterday. He reports pt spent the night at a neighbor's house. He confirms that pt's ex-girlfriend's daughter has been staying with pt, although has been staying in a hotel recently due to pt's continued substance use. Barnabas Lister feels that pt would benefit from 72 hour hold while ACT team attempts to connect pt with residential substance use treatment. He denies pt ever stated he had a plan or intent to act on a plan. He states pt made statement then walked away. Discussed w/ Barnabas Lister pt is not meeting IVC criteria at this time and will be discharged. Discussed w/ Barnabas Lister pt was offered admission to [facility] and pt declined. Barnabas Lister states he or another ACT staff will visit pt tomorrow.   First exam was completed, IVC rescinded.  Flowsheet Row ED to Hosp-Admission (Discharged) from 04/13/2022 in [facility] ED to Hosp-Admission (Discharged) from 04/05/2022 in [place] ED from 03/09/2022 in [facility] Emergency Department at [place] No Risk High Risk No Risk       Psychiatric Specialty Exam  Presentation  General Appearance:Disheveled; Casual  Eye Contact:Fair  Speech:Clear and Coherent; Normal Rate  Speech Volume:Normal  Handedness:Right   Mood and Affect  Mood: Euthymic  Affect: Flat   Thought Process  Thought Processes: Coherent; Goal Directed; Linear  Descriptions of Associations:Intact  Orientation:Full (Time, Place and Person)  Thought Content:Logical    Hallucinations:Auditory chronic auditory hallucinations for the past 6 to 7 months telling him they are going to kill him  Ideas of Reference:None  Suicidal Thoughts:No  Homicidal Thoughts:No   Sensorium  Memory: Immediate  Fair  Judgment: Fair  Insight: Fair   Community education officer  Concentration: Fair  Attention Span: Fair  Recall: Good  Fund of Knowledge: Good  Language: Good   Psychomotor Activity  Psychomotor Activity: Other (comment)   Assets  Assets: Communication Skills; Financial Resources/Insurance; Housing; Resilience; Social Support   Sleep  Sleep: Poor  Number of hours: 0 (4 to 5 hours/night)   Physical Exam: Physical Exam Constitutional:      General: He is not in acute distress.    Appearance: He is not diaphoretic.     Comments: Chronically ill appearing  Eyes:     General: No scleral icterus. Cardiovascular:     Rate and Rhythm: Normal rate.  Pulmonary:     Effort: Pulmonary effort is normal. No respiratory distress.  Neurological:     Mental Status: He is alert and oriented to person, place, and time.  Psychiatric:        Attention and Perception: Attention normal. He perceives auditory hallucinations.        Mood and Affect: Mood normal. Affect is flat.        Speech: Speech normal.        Behavior: Behavior normal. Behavior is cooperative.        Thought Content: Thought content normal.  Cognition and Memory: Cognition and memory normal.    Review of Systems  Constitutional:  Negative for chills and fever.  Respiratory:  Negative for shortness of breath.   Cardiovascular:  Negative for chest pain and palpitations.  Gastrointestinal:  Negative for abdominal pain.  Neurological:  Negative for headaches.  Psychiatric/Behavioral:  Positive for hallucinations and substance abuse. Negative for depression, memory loss and suicidal ideas. The patient is not nervous/anxious and does not have insomnia.    Blood pressure (!) 149/77, pulse 95, temperature 99 F (37.2 C), temperature source Oral, resp. rate 18, SpO2 100 %. There is no height or weight on file to calculate BMI.  Musculoskeletal: Strength & Muscle Tone:  pt sitting on exam, reports  ambulating with a walker at home Gait & Station:  pt sitting on exam, reports ambulating with a walker at home Patient leans:  pt sitting on exam, reports ambulating with a walker at home  [facility] MSE Discharge Disposition for Follow up and Recommendations: Based on my evaluation the patient does not appear to have an emergency medical condition and can be discharged with resources and follow up care in outpatient services. Patient reports he will follow up with his ACT team for continued medication management and counseling.   Tharon Aquas, NP 04/15/2022, 5:33 PM

## 2022-04-15 NOTE — Discharge Instructions (Addendum)
Please follow up with your ACT team for continued medication management and counseling.   Patient is instructed prior to discharge to: Take all medications as prescribed by his/her mental healthcare provider. Report any adverse effects and or reactions from the medicines to his/her outpatient provider promptly. Keep all scheduled appointments, to ensure that you are getting refills on time and to avoid any interruption in your medication.  If you are unable to keep an appointment call to reschedule.  Be sure to follow-up with resources and follow-up appointments provided.  Patient has been instructed & cautioned: To not engage in alcohol and or illegal drug use while on prescription medicines. In the event of worsening symptoms, patient is instructed to call the crisis hotline, 911 and or go to the nearest ED for appropriate evaluation and treatment of symptoms. To follow-up with his/her primary care provider for your other medical issues, concerns and or health care needs.  Information: -National Suicide Prevention Lifeline 1-800-SUICIDE or 816-760-2335.  -988 offers 24/7 access to trained crisis counselors who can help people experiencing mental health-related distress. People can call or text 988 or chat 988lifeline.org for themselves or if they are worried about a loved one who may need crisis support.

## 2022-04-18 LAB — CULTURE, BLOOD (ROUTINE X 2)
Culture: NO GROWTH
Culture: NO GROWTH

## 2022-05-01 ENCOUNTER — Encounter (HOSPITAL_COMMUNITY): Payer: Self-pay

## 2022-05-01 ENCOUNTER — Emergency Department (HOSPITAL_COMMUNITY): Payer: 59

## 2022-05-01 ENCOUNTER — Emergency Department (HOSPITAL_COMMUNITY)
Admission: EM | Admit: 2022-05-01 | Discharge: 2022-05-03 | Disposition: A | Discharge status: Home / Self Care | Payer: 59 | Attending: Emergency Medicine | Admitting: Emergency Medicine

## 2022-05-01 DIAGNOSIS — R443 Hallucinations, unspecified: Secondary | ICD-10-CM | POA: Diagnosis not present

## 2022-05-01 DIAGNOSIS — Z7984 Long term (current) use of oral hypoglycemic drugs: Secondary | ICD-10-CM | POA: Insufficient documentation

## 2022-05-01 DIAGNOSIS — F1190 Opioid use, unspecified, uncomplicated: Secondary | ICD-10-CM | POA: Insufficient documentation

## 2022-05-01 DIAGNOSIS — R45851 Suicidal ideations: Secondary | ICD-10-CM | POA: Insufficient documentation

## 2022-05-01 DIAGNOSIS — Z79899 Other long term (current) drug therapy: Secondary | ICD-10-CM | POA: Insufficient documentation

## 2022-05-01 DIAGNOSIS — F1510 Other stimulant abuse, uncomplicated: Secondary | ICD-10-CM | POA: Diagnosis present

## 2022-05-01 DIAGNOSIS — Z955 Presence of coronary angioplasty implant and graft: Secondary | ICD-10-CM | POA: Insufficient documentation

## 2022-05-01 DIAGNOSIS — E860 Dehydration: Secondary | ICD-10-CM | POA: Insufficient documentation

## 2022-05-01 DIAGNOSIS — R440 Auditory hallucinations: Secondary | ICD-10-CM | POA: Insufficient documentation

## 2022-05-01 DIAGNOSIS — Z7902 Long term (current) use of antithrombotics/antiplatelets: Secondary | ICD-10-CM | POA: Diagnosis not present

## 2022-05-01 DIAGNOSIS — Z7901 Long term (current) use of anticoagulants: Secondary | ICD-10-CM | POA: Diagnosis not present

## 2022-05-01 DIAGNOSIS — F411 Generalized anxiety disorder: Secondary | ICD-10-CM | POA: Insufficient documentation

## 2022-05-01 DIAGNOSIS — Z1152 Encounter for screening for COVID-19: Secondary | ICD-10-CM | POA: Diagnosis not present

## 2022-05-01 DIAGNOSIS — F1410 Cocaine abuse, uncomplicated: Secondary | ICD-10-CM

## 2022-05-01 DIAGNOSIS — F1594 Other stimulant use, unspecified with stimulant-induced mood disorder: Secondary | ICD-10-CM | POA: Insufficient documentation

## 2022-05-01 DIAGNOSIS — F17210 Nicotine dependence, cigarettes, uncomplicated: Secondary | ICD-10-CM | POA: Insufficient documentation

## 2022-05-01 DIAGNOSIS — F333 Major depressive disorder, recurrent, severe with psychotic symptoms: Secondary | ICD-10-CM | POA: Diagnosis not present

## 2022-05-01 DIAGNOSIS — E119 Type 2 diabetes mellitus without complications: Secondary | ICD-10-CM | POA: Insufficient documentation

## 2022-05-01 DIAGNOSIS — Z8673 Personal history of transient ischemic attack (TIA), and cerebral infarction without residual deficits: Secondary | ICD-10-CM | POA: Insufficient documentation

## 2022-05-01 DIAGNOSIS — F1994 Other psychoactive substance use, unspecified with psychoactive substance-induced mood disorder: Secondary | ICD-10-CM

## 2022-05-01 DIAGNOSIS — I10 Essential (primary) hypertension: Secondary | ICD-10-CM | POA: Diagnosis not present

## 2022-05-01 DIAGNOSIS — F1520 Other stimulant dependence, uncomplicated: Secondary | ICD-10-CM

## 2022-05-01 LAB — RAPID URINE DRUG SCREEN, HOSP PERFORMED
Amphetamines: POSITIVE — AB
Barbiturates: NOT DETECTED
Benzodiazepines: POSITIVE — AB
Cocaine: POSITIVE — AB
Opiates: NOT DETECTED
Tetrahydrocannabinol: NOT DETECTED

## 2022-05-01 LAB — CBC WITH DIFFERENTIAL/PLATELET
Abs Immature Granulocytes: 0.02 10*3/uL (ref 0.00–0.07)
Basophils Absolute: 0.1 10*3/uL (ref 0.0–0.1)
Basophils Relative: 1 %
Eosinophils Absolute: 0.1 10*3/uL (ref 0.0–0.5)
Eosinophils Relative: 2 %
HCT: 34.8 % — ABNORMAL LOW (ref 39.0–52.0)
Hemoglobin: 10.9 g/dL — ABNORMAL LOW (ref 13.0–17.0)
Immature Granulocytes: 0 %
Lymphocytes Relative: 22 %
Lymphs Abs: 1.4 10*3/uL (ref 0.7–4.0)
MCH: 27.9 pg (ref 26.0–34.0)
MCHC: 31.3 g/dL (ref 30.0–36.0)
MCV: 89.2 fL (ref 80.0–100.0)
Monocytes Absolute: 0.5 10*3/uL (ref 0.1–1.0)
Monocytes Relative: 8 %
Neutro Abs: 4.3 10*3/uL (ref 1.7–7.7)
Neutrophils Relative %: 67 %
Platelets: 324 10*3/uL (ref 150–400)
RBC: 3.9 MIL/uL — ABNORMAL LOW (ref 4.22–5.81)
RDW: 15.9 % — ABNORMAL HIGH (ref 11.5–15.5)
WBC: 6.5 10*3/uL (ref 4.0–10.5)
nRBC: 0 % (ref 0.0–0.2)

## 2022-05-01 LAB — COMPREHENSIVE METABOLIC PANEL
ALT: 13 U/L (ref 0–44)
AST: 17 U/L (ref 15–41)
Albumin: 2.9 g/dL — ABNORMAL LOW (ref 3.5–5.0)
Alkaline Phosphatase: 91 U/L (ref 38–126)
Anion gap: 14 (ref 5–15)
BUN: 29 mg/dL — ABNORMAL HIGH (ref 8–23)
CO2: 21 mmol/L — ABNORMAL LOW (ref 22–32)
Calcium: 8.9 mg/dL (ref 8.9–10.3)
Chloride: 100 mmol/L (ref 98–111)
Creatinine, Ser: 2.04 mg/dL — ABNORMAL HIGH (ref 0.61–1.24)
GFR, Estimated: 36 mL/min — ABNORMAL LOW (ref >60)
Glucose, Bld: 188 mg/dL — ABNORMAL HIGH (ref 70–99)
Potassium: 4.6 mmol/L (ref 3.5–5.1)
Sodium: 135 mmol/L (ref 135–145)
Total Bilirubin: 0.5 mg/dL (ref 0.3–1.2)
Total Protein: 6.5 g/dL (ref 6.5–8.1)

## 2022-05-01 LAB — CBG MONITORING, ED: Glucose-Capillary: 167 mg/dL — ABNORMAL HIGH (ref 70–99)

## 2022-05-01 LAB — ETHANOL: Alcohol, Ethyl (B): <10 mg/dL (ref <10)

## 2022-05-01 MED ORDER — DIAZEPAM 5 MG/ML IJ SOLN
2.0000 mg | Freq: Once | INTRAMUSCULAR | Status: AC
  Administered 2022-05-01: 2 mg via INTRAVENOUS
  Filled 2022-05-01: qty 2

## 2022-05-01 MED ORDER — ALBUTEROL SULFATE HFA 108 (90 BASE) MCG/ACT IN AERS: 2.0000 | INHALATION_SPRAY | RESPIRATORY_TRACT | Status: DC | PRN

## 2022-05-01 MED ORDER — GABAPENTIN 300 MG PO CAPS
600.0000 mg | ORAL_CAPSULE | Freq: Once | ORAL | Status: AC
  Administered 2022-05-01: 600 mg via ORAL
  Filled 2022-05-01: qty 2

## 2022-05-01 MED ORDER — APIXABAN 5 MG PO TABS
5.0000 mg | ORAL_TABLET | Freq: Two times a day (BID) | ORAL | Status: DC
  Administered 2022-05-01 – 2022-05-02 (×3): 5 mg via ORAL
  Filled 2022-05-01 (×3): qty 1

## 2022-05-01 MED ORDER — TRAZODONE HCL 50 MG PO TABS
50.0000 mg | ORAL_TABLET | Freq: Every day | ORAL | Status: DC
  Administered 2022-05-01 – 2022-05-02 (×2): 50 mg via ORAL
  Filled 2022-05-01 (×2): qty 1

## 2022-05-01 MED ORDER — ROSUVASTATIN CALCIUM 5 MG PO TABS
20.0000 mg | ORAL_TABLET | Freq: Every day | ORAL | Status: DC
  Administered 2022-05-01 – 2022-05-02 (×2): 20 mg via ORAL
  Filled 2022-05-01 (×2): qty 4

## 2022-05-01 MED ORDER — SODIUM CHLORIDE 0.9 % IV BOLUS
1000.0000 mL | Freq: Once | INTRAVENOUS | Status: AC
  Administered 2022-05-01: 1000 mL via INTRAVENOUS

## 2022-05-01 MED ORDER — SERTRALINE HCL 50 MG PO TABS
75.0000 mg | ORAL_TABLET | Freq: Every day | ORAL | Status: DC
  Administered 2022-05-02: 75 mg via ORAL
  Filled 2022-05-01: qty 1

## 2022-05-01 MED ORDER — FAMOTIDINE 20 MG PO TABS: 20.0000 mg | ORAL_TABLET | Freq: Every day | ORAL | Status: DC | PRN

## 2022-05-01 MED ORDER — INSULIN ASPART 100 UNIT/ML IJ SOLN
0.0000 [IU] | Freq: Every day | INTRAMUSCULAR | Status: DC
  Administered 2022-05-02: 3 [IU] via SUBCUTANEOUS

## 2022-05-01 MED ORDER — INSULIN ASPART 100 UNIT/ML IJ SOLN
0.0000 [IU] | Freq: Three times a day (TID) | INTRAMUSCULAR | Status: DC
  Administered 2022-05-02 (×2): 2 [IU] via SUBCUTANEOUS

## 2022-05-01 MED ORDER — CARVEDILOL 3.125 MG PO TABS
6.2500 mg | ORAL_TABLET | Freq: Two times a day (BID) | ORAL | Status: DC
  Administered 2022-05-02 (×2): 6.25 mg via ORAL
  Filled 2022-05-01 (×2): qty 2

## 2022-05-01 MED ORDER — CLOPIDOGREL BISULFATE 75 MG PO TABS
75.0000 mg | ORAL_TABLET | Freq: Every day | ORAL | Status: DC
  Administered 2022-05-02: 75 mg via ORAL
  Filled 2022-05-01: qty 1

## 2022-05-01 NOTE — ED Notes (Signed)
Patient complaints of legs pain at this time, denies SI/HI at this time.

## 2022-05-01 NOTE — Discharge Instructions (Addendum)
It was our pleasure to provide your ER care today - we hope that you feel better.  Drink plenty of fluids/stay well hydrated.   Avoid drug use as it is harmful to your physical health and mental well-being. See resource guide attached in terms of accessing inpatient or outpatient substance use treatment programs.   Follow up closely with primary care doctor and behavioral health provider in the coming week.  For mental health issues and/or crisis, you may also go directly to the [facility] - they are open 24/7 and walk-ins are welcome.    Return to ER if worse, new symptoms, fevers, chest pain, trouble breathing, or other emergency concern.

## 2022-05-01 NOTE — ED Provider Notes (Addendum)
[facility] Provider Note   CSN: [id number] Arrival date & time: 05/01/22  1621     History  Chief Complaint  Patient presents with   Hallucinations    [name] is a 67 y.o. male.  Patient here with some hallucinations.  He has been using meth last few days.  He is having acute on chronic pain in his lower legs.  He takes gabapentin for this.  He denies any SI or HI.  He is not having any hallucinations on my exam.  He is mostly complaining of burning pain in his feet.  He denies any chest pain or shortness of breath.  Denies any weakness numbness chills.  The history is provided by the patient.       Home Medications Prior to Admission medications   Medication Sig Start Date End Date Taking? Authorizing Provider  albuterol (VENTOLIN HFA) 108 (90 Base) MCG/ACT inhaler Inhale 2 puffs into the lungs every 4 (four) hours as needed for wheezing or shortness of breath.    [provider]  apixaban (ELIQUIS) 5 MG TABS tablet Take 1 tablet (5 mg total) by mouth 2 (two) times daily. 04/08/22 04/08/23  Modena Jansky, MD  blood glucose meter kit and supplies KIT Dispense based on patient and insurance preference. Use up to four times daily as directed. (FOR ICD-9 250.00, 250.01). 03/09/20   Raiford Noble Latif, DO  carvedilol (COREG) 6.25 MG tablet TAKE 1 TABLET (6.25 MG TOTAL) BY MOUTH TWO TIMES DAILY WITH A MEAL. Patient taking differently: Take 6.25 mg by mouth 2 (two) times daily with a meal. 03/09/20 04/14/23  Raiford Noble Latif, DO  clopidogrel (PLAVIX) 75 MG tablet Take 75 mg by mouth daily.    [provider]  Dulaglutide (TRULICITY) 1.5 0000000 SOPN Inject 1.5 mg into the skin once a week. Friday    [provider]  famotidine (PEPCID) 20 MG tablet TAKE 1 TABLET (20 MG TOTAL) BY MOUTH TWO TIMES DAILY. Patient taking differently: Take 20 mg by mouth daily as needed for heartburn. 03/09/20 04/14/23   Raiford Noble Latif, DO  gabapentin (NEURONTIN) 600 MG tablet Take 1,200 mg by mouth 3 (three) times daily. 04/24/19   [provider]  glipiZIDE (GLUCOTROL XL) 10 MG 24 hr tablet TAKE 1 TABLET (10 MG TOTAL) BY MOUTH DAILY. Patient taking differently: Take 10 mg by mouth daily. 03/09/20 04/14/23  Raiford Noble Latif, DO  metFORMIN (GLUCOPHAGE) 1000 MG tablet Take 1,000 mg by mouth 2 (two) times daily with a meal.    [provider]  polyethylene glycol (MIRALAX / GLYCOLAX) 17 g packet Take 17 g by mouth daily as needed for mild constipation.    [provider]  rosuvastatin (CRESTOR) 20 MG tablet Take 20 mg by mouth at bedtime. 04/24/19   [provider]  sacubitril-valsartan (ENTRESTO) 24-26 MG Take 1 tablet by mouth 2 (two) times daily.    [provider]  senna-docusate (SENOKOT-S) 8.6-50 MG tablet Take 2 tablets by mouth daily as needed for mild constipation.    [provider]  sertraline (ZOLOFT) 50 MG tablet Take 75 mg by mouth daily. Taking 1 & 1/2 tabs daily = 75 mg 7 DS delivered on 04-01-22    [provider]  thiamine (VITAMIN B-1) 100 MG tablet Take 100 mg by mouth daily.    [provider]  tiZANidine (ZANAFLEX) 4 MG capsule Take 4 mg by mouth 2 (two) times  daily as needed for muscle spasms.    [provider]  traMADol (ULTRAM) 50 MG tablet Take 50 mg by mouth every 6 (six) hours as needed for moderate pain.    [provider]  traZODone (DESYREL) 50 MG tablet Take 50 mg by mouth at bedtime.    [provider]      Allergies    Patient has no known allergies.    Review of Systems   Review of Systems  Physical Exam Updated Vital Signs BP (!) 148/86   Pulse 99   Temp 98.2 F (36.8 C) (Oral)   Resp 18   Ht '5\' 10"'$  (1.778 m)   Wt 95.3 kg   SpO2 97%   BMI 30.13 kg/m  Physical Exam Vitals and nursing note reviewed.  Constitutional:      General: He is not in acute distress.     Appearance: He is well-developed. He is not ill-appearing.  HENT:     Head: Normocephalic and atraumatic.     Nose: Nose normal.     Mouth/Throat:     Mouth: Mucous membranes are moist.  Eyes:     Extraocular Movements: Extraocular movements intact.     Conjunctiva/sclera: Conjunctivae normal.     Pupils: Pupils are equal, round, and reactive to light.  Cardiovascular:     Rate and Rhythm: Normal rate and regular rhythm.     Pulses: Normal pulses.     Heart sounds: Normal heart sounds. No murmur heard. Pulmonary:     Effort: Pulmonary effort is normal. No respiratory distress.     Breath sounds: Normal breath sounds.  Abdominal:     Palpations: Abdomen is soft.     Tenderness: There is no abdominal tenderness.  Musculoskeletal:        General: No swelling.     Cervical back: Neck supple.  Skin:    General: Skin is warm and dry.     Capillary Refill: Capillary refill takes less than 2 seconds.     Comments: No obvious cellulitis on exam  Neurological:     General: No focal deficit present.     Mental Status: He is alert.  Psychiatric:        Mood and Affect: Mood normal.     Comments: Denies SI and HI, he is mildly agitated due to the pain in his feet     ED Results / Procedures / Treatments   Labs (all labs ordered are listed, but only abnormal results are displayed) Labs Reviewed  COMPREHENSIVE METABOLIC PANEL - Abnormal; Notable for the following components:      Result Value   CO2 21 (*)    Glucose, Bld 188 (*)    BUN 29 (*)    Creatinine, Ser 2.04 (*)    Albumin 2.9 (*)    GFR, Estimated 36 (*)    All other components within normal limits  RAPID URINE DRUG SCREEN, HOSP PERFORMED - Abnormal; Notable for the following components:   Cocaine POSITIVE (*)    Benzodiazepines POSITIVE (*)    Amphetamines POSITIVE (*)    All other components within normal limits  CBC WITH DIFFERENTIAL/PLATELET - Abnormal; Notable for the following components:   RBC 3.90 (*)     Hemoglobin 10.9 (*)    HCT 34.8 (*)    RDW 15.9 (*)    All other components within normal limits  ETHANOL    EKG EKG Interpretation  Date/Time:  Thursday May 01 2022 16:45:33 EST Ventricular  Rate:  108 PR Interval:  140 QRS Duration: 96 QT Interval:  366 QTC Calculation: 490 R Axis:   81 Text Interpretation: Sinus tachycardia Nonspecific T wave abnormality Abnormal ECG When compared with ECG of 13-Apr-2022 04:41, PREVIOUS ECG IS PRESENT Confirmed by Lennice Sites [phone]) on 05/01/2022 5:39:33 PM  Radiology CT Head Wo Contrast  Result Date: 05/01/2022 CLINICAL DATA:  Hallucinations. EXAM: CT HEAD WITHOUT CONTRAST TECHNIQUE: Contiguous axial images were obtained from the base of the skull through the vertex without intravenous contrast. RADIATION DOSE REDUCTION: This exam was performed according to the departmental dose-optimization program which includes automated exposure control, adjustment of the mA and/or kV according to patient size and/or use of iterative reconstruction technique. COMPARISON:  April 06, 2022 FINDINGS: Brain: There is mild cerebral atrophy with widening of the extra-axial spaces and ventricular dilatation. There are areas of decreased attenuation within the white matter tracts of the supratentorial brain, consistent with microvascular disease changes. Small chronic bilateral cerebellar infarcts are seen. Additional small pontine lacunar infarct is noted on the right. Vascular: Bilateral marked severity cavernous carotid artery calcification is noted. Skull: Normal. Negative for fracture or focal lesion. Sinuses/Orbits: There is mild to moderate severity right-sided ethmoid sinus mucosal thickening. Marked severity right maxillary sinus mucosal thickening is also seen. Other: None. IMPRESSION: 1. No acute intracranial abnormality. 2. Small chronic bilateral cerebellar infarcts. 3. Marked severity right maxillary sinus and mild to moderate severity right-sided ethmoid sinus  disease. Electronically Signed   By: Virgina Norfolk M.D.   On: 05/01/2022 19:22    Procedures Procedures    Medications Ordered in ED Medications  sodium chloride 0.9 % bolus 1,000 mL (0 mLs Intravenous Stopped 05/01/22 1828)  diazepam (VALIUM) injection 2 mg (2 mg Intravenous Given 05/01/22 1827)  sodium chloride 0.9 % bolus 1,000 mL (0 mLs Intravenous Stopped 05/01/22 2019)  gabapentin (NEURONTIN) capsule 600 mg (600 mg Oral Given 05/01/22 2019)    ED Course/ Medical Decision Making/ A&P                             Medical Decision Making Amount and/or Complexity of Data Reviewed Labs: ordered. Radiology: ordered.  Risk Prescription drug management.   [name] patient here with some ongoing chronic feet pain.  He is on gabapentin.  Some hallucinations on and off but he has been abusing meth.  He denies any chest pain or shortness of breath.  He is well-appearing.  He has no suicidal homicidal ideation.  He is not hallucinating.  He is overall without any signs of infection on exam.  He is got good pulses throughout.  Lab work was done and showed may be mild AKI.  He was given 2 L of IV fluids.  He was given something to eat and drink.  He was given Valium and gabapentin for his chronic pain.  Ultimately patient does not have any acute indication for admission at this time.  He appears medically stable and psychologically stable at this time.  Discharged in good condition.  Patient now endorsing any suicidal.  Want to talk with psychiatry.  Patient is medically cleared at this time.  This chart was dictated using voice recognition software.  Despite best efforts to proofread,  errors can occur which can change the documentation meaning.         Final Clinical Impression(s) / ED Diagnoses Final diagnoses:  Dehydration  Suicidal ideation    Rx /  DC Orders ED Discharge Orders     None         Lennice Sites, DO 05/01/22 2046    Lennice Sites, DO 05/01/22  [id number]

## 2022-05-01 NOTE — ED Triage Notes (Signed)
Pt arrives to ED, brought in by [facility] and leo voluntarily. PT reports Auditory Hallucinations for the past 6 months. PT states the voices are saying they want to harm him. Pt denies SI or HI. Reports using meth 2 days ago. PT is AxOx4. Pt reports chronic pain to lower extremities. Denies cp or sob.

## 2022-05-02 DIAGNOSIS — F15951 Other stimulant use, unspecified with stimulant-induced psychotic disorder with hallucinations: Secondary | ICD-10-CM | POA: Insufficient documentation

## 2022-05-02 DIAGNOSIS — F1594 Other stimulant use, unspecified with stimulant-induced mood disorder: Secondary | ICD-10-CM | POA: Diagnosis not present

## 2022-05-02 DIAGNOSIS — R443 Hallucinations, unspecified: Secondary | ICD-10-CM | POA: Diagnosis not present

## 2022-05-02 DIAGNOSIS — F1520 Other stimulant dependence, uncomplicated: Secondary | ICD-10-CM

## 2022-05-02 LAB — CBG MONITORING, ED
Glucose-Capillary: 127 mg/dL — ABNORMAL HIGH (ref 70–99)
Glucose-Capillary: 148 mg/dL — ABNORMAL HIGH (ref 70–99)
Glucose-Capillary: 108 mg/dL — ABNORMAL HIGH (ref 70–99)
Glucose-Capillary: 158 mg/dL — ABNORMAL HIGH (ref 70–99)

## 2022-05-02 NOTE — ED Provider Notes (Addendum)
Emergency Medicine Observation Re-evaluation Note  [name] is a 67 y.o. male, seen on rounds today.  Pt initially presented to the ED for complaints of substance use disorder and reported earlier of hearing voices intermittently in past several months.   Physical Exam  BP (!) 165/86 (BP Location: Left Arm)   Pulse 92   Temp 98.2 F (36.8 C) (Oral)   Resp 18   Ht 1.778 m ('5\' 10"'$ )   Wt 95.3 kg   SpO2 98%   BMI 30.13 kg/m  Physical Exam General: calm, resting.  Cardiac: regular rate.  Lungs: breathing comfortably. Psych: pt with normal mood and affect. Indicates is hungry, asking for breakfast (provided).  Pt does not appear acutely depressed or despondent. No SI/HI.  Pt does not appear to be responding to internal stimuli - no delusions or hallucinations are noted.   ED Course / MDM    I have reviewed the labs performed to date as well as medications administered while in observation.  Recent changes in the last 24 hours include ED obs, metabolism, reassessment.   Plan  Labs reviewed - UDS +meth and cocaine, ?possibly related to earlier symptoms.   Currently reports feeling improved, ate breakfast. No acute psychosis noted.   Rec close outpatient f/u with pcp and bh f/u as well, and pt is encouraged to pursue sobriety, will provided resource guide for substance use treatment programs.   Return precautions provided.       Lajean Saver, MD 05/02/22 [phone]  Bh team has decide to bring to [facility] for initiation of SUD treatment.  Pt currently appears stable for transport to [facility].     Lajean Saver, MD 05/02/22 [phone]

## 2022-05-02 NOTE — ED Provider Notes (Signed)
Patient accepted to facility based crisis, accepting doctor is Dr. Charlesetta Shanks.  I was asked to do EMTALA at time of transfer.  Patient is otherwise stable and medically cleared.   Lorelle Gibbs, DO 05/02/22 2331

## 2022-05-02 NOTE — ED Notes (Signed)
RN assisted pt with walker. Pt walked fine with walker and assistance.
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
## 2022-05-02 NOTE — ED Notes (Signed)
ED Provider at bedside. 

## 2022-05-02 NOTE — ED Notes (Signed)
Pt ambulated to bathroom with assistance.

## 2022-05-02 NOTE — Consult Note (Signed)
[facility] Face-to-Face Psychiatry Consult   Reason for Consult:  SI Referring Physician:  Lennice Sites, DO  Patient Identification: [name] MRN:  [id number] Principal Diagnosis: Hallucination Diagnosis:  Principal Problem:   Hallucination Active Problems:   Methamphetamine use disorder, severe, dependence ([place])   Total Time spent with patient: 45 minutes  Subjective:   [name] is a 67 y.o. male patient admitted with auditory hallucination.  HPI:  [name] is a 67 year old male patient with history of major depressive disorder, GAD, opioid use disorder severe, hallucination, suicidal ideation, bipolar 1 disorder, depressive disorder, methamphetamine use disorder severe dependence who was brought in voluntarily by [facility] and [facility] due to having auditory hallucination.   Patient was seen face to face by this provider, consulted with Dr Dwyane Dee and chart reviewed.  On evaluation patient is alert and oriented x3, speech is clear and coherent. Patient's eye contact is good, mood is euthymic, affect is flat. Patient's thought process is coherent and thought content is logical. Patient endorsed suicidal ideation with no plan currently. Patient denies HI. Patient reported having auditory hallucination, hearing voices of a lot of people. Patient reported that this has been making him uncomfortable, not able to sleep and having suicidal thoughts. Patient reported that he is paranoid and he thinks people are out to kill him. Patient says he sees things that are not there sometimes. Patient does not appear to be responding to internal stimuli and no delusion noted.    Patient reported that he hears voices constantly: He says it started with one voice to two voices, he says it went up to five different loud voices. Patient reported that the voices are constant at night, disrupting his sleep. Patient reported that he is suicidal with no current plan. Patient reported that he smokes  crystal meth, but since 3 weeks ago, he has been snorting it.  Patient reported that the last time he snorts meth was 2 days ago. Patient reported being on several medications but he was not sure the last time he took his medication. Patient also reported using marijuana once in a while.  Patient reported that he follows ACT team, psycho-therapeutic services and he saw them last week. Patient says he lives alone but has a friend that checks on him frequently. Patient says he is on disability. Patient denies using alcohol.  Patient denies having gun or fire weapon at home.   Support, encouragement and reassurance provided about ongoing stressors and patient provided with opportunity for questions.      Patient will be admitted to [facility] for detox treatment and stabilization  Past Psychiatric History: Major depressive disorder, GAD, opioid use disorder, hallucination, suicidal ideation, bipolar 1 disorder, depressive disorder, methamphetamine use disorder severe dependence  Risk to Self: Yes Risk to Others: No Prior Inpatient Therapy: Yes Prior Outpatient Therapy: Yes  Past Medical History:  Past Medical History:  Diagnosis Date   Depression    Diabetes mellitus without complication ([place])    Hypertension    Stroke [facility])     Past Surgical History:  Procedure Laterality Date   ABDOMINAL AORTOGRAM W/LOWER EXTREMITY Bilateral 05/02/2019   Procedure: ABDOMINAL AORTOGRAM W/LOWER EXTREMITY;  Surgeon: Waynetta Sandy, MD;  Location: [place] [facility];  Service: Cardiovascular;  Laterality: Bilateral;   CORONARY STENT INTERVENTION N/A 03/05/2020   Procedure: CORONARY STENT INTERVENTION;  Surgeon: Nelva Bush, MD;  Location: [place] [facility];  Service: Cardiovascular;  Laterality: N/A;   LEFT HEART CATH AND CORS/GRAFTS  ANGIOGRAPHY N/A 03/05/2020   Procedure: LEFT HEART CATH AND CORS/GRAFTS ANGIOGRAPHY;  Surgeon: Nelva Bush, MD;  Location: [place] [facility];  Service: Cardiovascular;   Laterality: N/A;   Family History: History reviewed. No pertinent family history. Family Psychiatric  History: See notes Social History:  Social History   Substance and Sexual Activity  Alcohol Use Yes     Social History   Substance and Sexual Activity  Drug Use Yes   Types: Marijuana, Cocaine, Methamphetamines    Social History   Socioeconomic History   Marital status: Divorced    Spouse name: Not on file   Number of children: Not on file   Years of education: Not on file   Highest education level: Not on file  Occupational History   Occupation: disable  Tobacco Use   Smoking status: Every Day    Packs/day: 1.00    Types: Cigarettes   Smokeless tobacco: Never  Substance and Sexual Activity   Alcohol use: Yes   Drug use: Yes    Types: Marijuana, Cocaine, Methamphetamines   Sexual activity: Not on file    Comment: week ago  Other Topics Concern   Not on file  Social History Narrative   Not on file   Social Determinants of Health   Financial Resource Strain: Not on file  Food Insecurity: No Food Insecurity (04/07/2022)   Hunger Vital Sign    Worried About Running Out of Food in the Last Year: Never true    Ran Out of Food in the Last Year: Never true  Transportation Needs: Unmet Transportation Needs (04/07/2022)   PRAPARE - Hydrologist (Medical): Yes    Lack of Transportation (Non-Medical): No  Physical Activity: Not on file  Stress: Not on file  Social Connections: Not on file   Additional Social History:    Allergies:  No Known Allergies  Labs:  Results for orders placed or performed during the hospital encounter of 05/01/22 (from the past 48 hour(s))  Comprehensive metabolic panel     Status: Abnormal   Collection Time: 05/01/22  4:40 PM  Result Value Ref Range   Sodium 135 135 - 145 mmol/L   Potassium 4.6 3.5 - 5.1 mmol/L   Chloride 100 98 - 111 mmol/L   CO2 21 (L) 22 - 32 mmol/L   Glucose, Bld 188 (H) 70 - 99 mg/dL     Comment: Glucose reference range applies only to samples taken after fasting for at least 8 hours.   BUN 29 (H) 8 - 23 mg/dL   Creatinine, Ser 2.04 (H) 0.61 - 1.24 mg/dL   Calcium 8.9 8.9 - 10.3 mg/dL   Total Protein 6.5 6.5 - 8.1 g/dL   Albumin 2.9 (L) 3.5 - 5.0 g/dL   AST 17 15 - 41 U/L   ALT 13 0 - 44 U/L   Alkaline Phosphatase 91 38 - 126 U/L   Total Bilirubin 0.5 0.3 - 1.2 mg/dL   GFR, Estimated 36 (L) >60 mL/min    Comment: (NOTE) Calculated using the CKD-EPI Creatinine Equation (2021)    Anion gap 14 5 - 15    Comment: Performed at [facility] [address], [place], [place] [address]  Ethanol     Status: None   Collection Time: 05/01/22  4:40 PM  Result Value Ref Range   Alcohol, Ethyl (B) <10 <10 mg/dL    Comment: (NOTE) Lowest detectable limit for serum alcohol is 10 mg/dL.  For medical purposes only. Performed at [facility], [place] [address], [place], [place] [address]   CBC with Diff     Status: Abnormal   Collection Time: 05/01/22  4:40 PM  Result Value Ref Range   WBC 6.5 4.0 - 10.5 K/uL   RBC 3.90 (L) 4.22 - 5.81 MIL/uL   Hemoglobin 10.9 (L) 13.0 - 17.0 g/dL   HCT 34.8 (L) 39.0 - 52.0 %   MCV 89.2 80.0 - 100.0 fL   MCH 27.9 26.0 - 34.0 pg   MCHC 31.3 30.0 - 36.0 g/dL   RDW 15.9 (H) 11.5 - 15.5 %   Platelets 324 150 - 400 K/uL   nRBC 0.0 0.0 - 0.2 %   Neutrophils Relative % 67 %   Neutro Abs 4.3 1.7 - 7.7 K/uL   Lymphocytes Relative 22 %   Lymphs Abs 1.4 0.7 - 4.0 K/uL   Monocytes Relative 8 %   Monocytes Absolute 0.5 0.1 - 1.0 K/uL   Eosinophils Relative 2 %   Eosinophils Absolute 0.1 0.0 - 0.5 K/uL   Basophils Relative 1 %   Basophils Absolute 0.1 0.0 - 0.1 K/uL   Immature Granulocytes 0 %   Abs Immature Granulocytes 0.02 0.00 - 0.07 K/uL    Comment: Performed at [facility] [address] [place], [place] [address]  Urine rapid drug screen (hosp performed)     Status: Abnormal   Collection Time: 05/01/22  8:13 PM   Result Value Ref Range   Opiates NONE DETECTED NONE DETECTED   Cocaine POSITIVE (A) NONE DETECTED   Benzodiazepines POSITIVE (A) NONE DETECTED   Amphetamines POSITIVE (A) NONE DETECTED   Tetrahydrocannabinol NONE DETECTED NONE DETECTED   Barbiturates NONE DETECTED NONE DETECTED    Comment: (NOTE) DRUG SCREEN FOR MEDICAL PURPOSES ONLY.  IF CONFIRMATION IS NEEDED FOR ANY PURPOSE, NOTIFY LAB WITHIN 5 DAYS.  LOWEST DETECTABLE LIMITS FOR URINE DRUG SCREEN Drug Class                     Cutoff (ng/mL) Amphetamine and metabolites    1000 Barbiturate and metabolites    200 Benzodiazepine                 200 Opiates and metabolites        300 Cocaine and metabolites        300 THC                            50 Performed at [facility], [place] [address] [place],  [address]   CBG monitoring, ED     Status: Abnormal   Collection Time: 05/01/22 11:25 PM  Result Value Ref Range   Glucose-Capillary 167 (H) 70 - 99 mg/dL    Comment: Glucose reference range applies only to samples taken after fasting for at least 8 hours.   Comment 1 Notify RN    Comment 2 Document in Chart   CBG monitoring, ED     Status: Abnormal   Collection Time: 05/02/22  8:21 AM  Result Value Ref Range   Glucose-Capillary 127 (H) 70 - 99 mg/dL    Comment: Glucose reference range applies only to samples taken after fasting for at least 8 hours.   Comment 1 Document in Chart     Current Facility-Administered Medications  Medication Dose Route Frequency Provider Last Rate Last Admin   albuterol (VENTOLIN  HFA) 108 (90 Base) MCG/ACT inhaler 2 puff  2 puff Inhalation Q4H PRN Curatolo, Adam, DO       apixaban (ELIQUIS) tablet 5 mg  5 mg Oral BID Curatolo, Adam, DO   5 mg at 05/02/22 0941   carvedilol (COREG) tablet 6.25 mg  6.25 mg Oral BID WC Curatolo, Adam, DO   6.25 mg at 05/02/22 0830   clopidogrel (PLAVIX) tablet 75 mg  75 mg Oral Daily Curatolo, Adam, DO   75 mg at 05/02/22 0941   famotidine (PEPCID)  tablet 20 mg  20 mg Oral Daily PRN Curatolo, Adam, DO       insulin aspart (novoLOG) injection 0-15 Units  0-15 Units Subcutaneous TID WC Curatolo, Adam, DO   2 Units at 05/02/22 0830   insulin aspart (novoLOG) injection 0-5 Units  0-5 Units Subcutaneous QHS Curatolo, Adam, DO       rosuvastatin (CRESTOR) tablet 20 mg  20 mg Oral QHS Curatolo, Adam, DO   20 mg at 05/01/22 2327   sertraline (ZOLOFT) tablet 75 mg  75 mg Oral Daily Curatolo, Adam, DO   75 mg at 05/02/22 0941   traZODone (DESYREL) tablet 50 mg  50 mg Oral QHS Curatolo, Adam, DO   50 mg at 05/01/22 2327   Current Outpatient Medications  Medication Sig Dispense Refill   albuterol (VENTOLIN HFA) 108 (90 Base) MCG/ACT inhaler Inhale 2 puffs into the lungs every 4 (four) hours as needed for wheezing or shortness of breath.     apixaban (ELIQUIS) 5 MG TABS tablet Take 1 tablet (5 mg total) by mouth 2 (two) times daily.     blood glucose meter kit and supplies KIT Dispense based on patient and insurance preference. Use up to four times daily as directed. (FOR ICD-9 250.00, 250.01). 1 each 0   carvedilol (COREG) 6.25 MG tablet TAKE 1 TABLET (6.25 MG TOTAL) BY MOUTH TWO TIMES DAILY WITH A MEAL. (Patient taking differently: Take 6.25 mg by mouth 2 (two) times daily with a meal.) 60 tablet 0   clopidogrel (PLAVIX) 75 MG tablet Take 75 mg by mouth daily.     Dulaglutide (TRULICITY) 1.5 0000000 SOPN Inject 1.5 mg into the skin once a week. Friday     famotidine (PEPCID) 20 MG tablet TAKE 1 TABLET (20 MG TOTAL) BY MOUTH TWO TIMES DAILY. (Patient taking differently: Take 20 mg by mouth daily as needed for heartburn.) 60 tablet 0   gabapentin (NEURONTIN) 600 MG tablet Take 1,200 mg by mouth 3 (three) times daily.     glipiZIDE (GLUCOTROL XL) 10 MG 24 hr tablet TAKE 1 TABLET (10 MG TOTAL) BY MOUTH DAILY. (Patient taking differently: Take 10 mg by mouth daily.) 30 tablet 0   metFORMIN (GLUCOPHAGE) 1000 MG tablet Take 1,000 mg by mouth 2 (two) times  daily with a meal.     polyethylene glycol (MIRALAX / GLYCOLAX) 17 g packet Take 17 g by mouth daily as needed for mild constipation.     rosuvastatin (CRESTOR) 20 MG tablet Take 20 mg by mouth at bedtime.     sacubitril-valsartan (ENTRESTO) 24-26 MG Take 1 tablet by mouth 2 (two) times daily.     senna-docusate (SENOKOT-S) 8.6-50 MG tablet Take 2 tablets by mouth daily as needed for mild constipation.     sertraline (ZOLOFT) 50 MG tablet Take 75 mg by mouth daily. Taking 1 & 1/2 tabs daily = 75 mg 7 DS delivered on 04-01-22     thiamine (VITAMIN B-1)  100 MG tablet Take 100 mg by mouth daily.     tiZANidine (ZANAFLEX) 4 MG capsule Take 4 mg by mouth 2 (two) times daily as needed for muscle spasms.     traMADol (ULTRAM) 50 MG tablet Take 50 mg by mouth every 6 (six) hours as needed for moderate pain.     traZODone (DESYREL) 50 MG tablet Take 50 mg by mouth at bedtime.      Musculoskeletal: Strength & Muscle Tone: within normal limits Gait & Station: normal Patient leans: N/A  Psychiatric Specialty Exam:  Presentation  General Appearance:  Appropriate for Environment  Eye Contact: Good  Speech: Clear and Coherent  Speech Volume: Normal  Handedness: Right   Mood and Affect  Mood: Euthymic  Affect: Flat   Thought Process  Thought Processes: Coherent  Descriptions of Associations:Intact  Orientation:Full (Time, Place and Person)  Thought Content:Logical  History of Schizophrenia/Schizoaffective disorder:No  Duration of Psychotic Symptoms:Greater than six months  Hallucinations:Hallucinations: Auditory Description of Auditory Hallucinations: " Hearing voices of different people"  Ideas of Reference:None  Suicidal Thoughts:Suicidal Thoughts: Yes, Passive SI Passive Intent and/or Plan: Without Plan  Homicidal Thoughts:Homicidal Thoughts: No   Sensorium  Memory: Immediate Good; Recent Good; Remote Good  Judgment: Poor  Insight: Fair   Sales promotion account executive: Fair  Attention Span: Fair  Recall: Good  Fund of Knowledge: Good  Language: Good   Psychomotor Activity  Psychomotor Activity: Psychomotor Activity: Normal   Assets  Assets: Communication Skills; Desire for Improvement; Housing; Social Support   Sleep  Sleep: Sleep: Poor   Physical Exam: Physical Exam Vitals and nursing note reviewed.  Eyes:     General:        Right eye: No discharge.        Left eye: No discharge.  Cardiovascular:     Pulses: Normal pulses.  Pulmonary:     Effort: No respiratory distress.     Breath sounds: No wheezing.  Musculoskeletal:        General: No deformity.  Neurological:     Mental Status: He is alert and oriented to person, place, and time.     Motor: No weakness.  Psychiatric:        Attention and Perception: He perceives auditory hallucinations. He does not perceive visual hallucinations.        Mood and Affect: Affect is flat.        Speech: Speech normal.        Behavior: Behavior is cooperative.        Thought Content: Thought content is not paranoid or delusional. Thought content includes suicidal ideation. Thought content does not include homicidal ideation. Thought content does not include suicidal plan.    Review of Systems  Constitutional:  Negative for diaphoresis, fever and weight loss.  HENT:  Negative for ear discharge, hearing loss and nosebleeds.   Eyes:  Negative for discharge and redness.  Respiratory:  Negative for shortness of breath and wheezing.   Cardiovascular:  Negative for chest pain.  Gastrointestinal:  Negative for nausea and vomiting.  Musculoskeletal:  Negative for back pain and neck pain.  Neurological:  Negative for dizziness, seizures, weakness and headaches.  Psychiatric/Behavioral:  Positive for hallucinations, substance abuse and suicidal ideas. Negative for depression.    Blood pressure (!) 165/86, pulse 92, temperature 98.2 F (36.8 C), temperature  source Oral, resp. rate 18, height '5\' 10"'$  (1.778 m), weight 95.3 kg, SpO2 98 %. Body mass index is 30.13 kg/m.  Treatment Plan Summary: Plan : Patient will be admitted to [facility] for detox treatment.   Disposition:  Patient will be admitted into [facility] for detox treatment.   Earney Mallet, NP 05/02/2022 12:19 PM

## 2022-05-03 ENCOUNTER — Other Ambulatory Visit (INDEPENDENT_AMBULATORY_CARE_PROVIDER_SITE_OTHER)
Admission: EM | Admit: 2022-05-03 | Discharge: 2022-05-06 | Disposition: A | Discharge status: Home / Self Care | Payer: 59 | Source: Home / Self Care | Admitting: Nurse Practitioner

## 2022-05-03 DIAGNOSIS — F411 Generalized anxiety disorder: Secondary | ICD-10-CM | POA: Insufficient documentation

## 2022-05-03 DIAGNOSIS — F1510 Other stimulant abuse, uncomplicated: Secondary | ICD-10-CM

## 2022-05-03 DIAGNOSIS — F333 Major depressive disorder, recurrent, severe with psychotic symptoms: Secondary | ICD-10-CM | POA: Insufficient documentation

## 2022-05-03 DIAGNOSIS — F1190 Opioid use, unspecified, uncomplicated: Secondary | ICD-10-CM | POA: Insufficient documentation

## 2022-05-03 DIAGNOSIS — F1594 Other stimulant use, unspecified with stimulant-induced mood disorder: Secondary | ICD-10-CM | POA: Insufficient documentation

## 2022-05-03 DIAGNOSIS — R45851 Suicidal ideations: Secondary | ICD-10-CM | POA: Insufficient documentation

## 2022-05-03 DIAGNOSIS — F1410 Cocaine abuse, uncomplicated: Secondary | ICD-10-CM

## 2022-05-03 LAB — LIPID PANEL
Cholesterol: 143 mg/dL (ref 0–200)
HDL: 35 mg/dL — ABNORMAL LOW (ref >40)
LDL Cholesterol: 64 mg/dL (ref 0–99)
Total CHOL/HDL Ratio: 4.1 RATIO
Triglycerides: 221 mg/dL — ABNORMAL HIGH (ref <150)
VLDL: 44 mg/dL — ABNORMAL HIGH (ref 0–40)

## 2022-05-03 LAB — COMPREHENSIVE METABOLIC PANEL
ALT: 11 U/L (ref 0–44)
AST: 14 U/L — ABNORMAL LOW (ref 15–41)
Albumin: 2.8 g/dL — ABNORMAL LOW (ref 3.5–5.0)
Alkaline Phosphatase: 88 U/L (ref 38–126)
Anion gap: 8 (ref 5–15)
BUN: 20 mg/dL (ref 8–23)
CO2: 28 mmol/L (ref 22–32)
Calcium: 8.4 mg/dL — ABNORMAL LOW (ref 8.9–10.3)
Chloride: 96 mmol/L — ABNORMAL LOW (ref 98–111)
Creatinine, Ser: 1.11 mg/dL (ref 0.61–1.24)
GFR, Estimated: >60 mL/min (ref >60)
Glucose, Bld: 148 mg/dL — ABNORMAL HIGH (ref 70–99)
Potassium: 4.1 mmol/L (ref 3.5–5.1)
Sodium: 132 mmol/L — ABNORMAL LOW (ref 135–145)
Total Bilirubin: 0.4 mg/dL (ref 0.3–1.2)
Total Protein: 6.2 g/dL — ABNORMAL LOW (ref 6.5–8.1)

## 2022-05-03 LAB — TSH: TSH: 1.582 u[IU]/mL (ref 0.350–4.500)

## 2022-05-03 LAB — SARS CORONAVIRUS 2 BY RT PCR: SARS Coronavirus 2 by RT PCR: NEGATIVE

## 2022-05-03 LAB — GLUCOSE, CAPILLARY: Glucose-Capillary: 99 mg/dL (ref 70–99)

## 2022-05-03 MED ORDER — CLONIDINE HCL 0.1 MG PO TABS: 0.1000 mg | ORAL_TABLET | ORAL | Status: DC

## 2022-05-03 MED ORDER — CLOPIDOGREL BISULFATE 75 MG PO TABS
75.0000 mg | ORAL_TABLET | Freq: Every day | ORAL | Status: DC
  Administered 2022-05-03 – 2022-05-06 (×4): 75 mg via ORAL
  Filled 2022-05-03 (×4): qty 1

## 2022-05-03 MED ORDER — ONDANSETRON 4 MG PO TBDP: 4.0000 mg | ORAL_TABLET | Freq: Four times a day (QID) | ORAL | Status: DC | PRN

## 2022-05-03 MED ORDER — GABAPENTIN 400 MG PO CAPS
1200.0000 mg | ORAL_CAPSULE | Freq: Three times a day (TID) | ORAL | Status: DC
  Administered 2022-05-03 – 2022-05-06 (×10): 1200 mg via ORAL
  Filled 2022-05-03 (×10): qty 3

## 2022-05-03 MED ORDER — DICYCLOMINE HCL 20 MG PO TABS: 20.0000 mg | ORAL_TABLET | Freq: Four times a day (QID) | ORAL | Status: DC | PRN

## 2022-05-03 MED ORDER — TRAZODONE HCL 50 MG PO TABS: 50.0000 mg | ORAL_TABLET | Freq: Every day | ORAL | Status: DC

## 2022-05-03 MED ORDER — CLONIDINE HCL 0.1 MG PO TABS: 0.1000 mg | ORAL_TABLET | Freq: Every day | ORAL | Status: DC

## 2022-05-03 MED ORDER — SERTRALINE HCL 50 MG PO TABS
75.0000 mg | ORAL_TABLET | Freq: Every day | ORAL | Status: DC
  Administered 2022-05-03 – 2022-05-06 (×4): 75 mg via ORAL
  Filled 2022-05-03 (×4): qty 1

## 2022-05-03 MED ORDER — NICOTINE 21 MG/24HR TD PT24
21.0000 mg | MEDICATED_PATCH | Freq: Every day | TRANSDERMAL | Status: DC
  Administered 2022-05-03 – 2022-05-06 (×4): 21 mg via TRANSDERMAL
  Filled 2022-05-03 (×4): qty 1

## 2022-05-03 MED ORDER — METFORMIN HCL 500 MG PO TABS
1000.0000 mg | ORAL_TABLET | Freq: Two times a day (BID) | ORAL | Status: DC
  Administered 2022-05-03 – 2022-05-06 (×7): 1000 mg via ORAL
  Filled 2022-05-03 (×7): qty 2

## 2022-05-03 MED ORDER — ALUM & MAG HYDROXIDE-SIMETH 200-200-20 MG/5ML PO SUSP: 30.0000 mL | ORAL | Status: DC | PRN

## 2022-05-03 MED ORDER — METHOCARBAMOL 500 MG PO TABS
500.0000 mg | ORAL_TABLET | Freq: Three times a day (TID) | ORAL | Status: DC | PRN
  Administered 2022-05-03 – 2022-05-06 (×3): 500 mg via ORAL
  Filled 2022-05-03 (×3): qty 1

## 2022-05-03 MED ORDER — FAMOTIDINE 20 MG PO TABS: 20.0000 mg | ORAL_TABLET | Freq: Every day | ORAL | Status: DC | PRN

## 2022-05-03 MED ORDER — LOPERAMIDE HCL 2 MG PO CAPS: 2.0000 mg | ORAL_CAPSULE | ORAL | Status: DC | PRN

## 2022-05-03 MED ORDER — ACETAMINOPHEN 325 MG PO TABS: 650.0000 mg | ORAL_TABLET | Freq: Four times a day (QID) | ORAL | Status: DC | PRN

## 2022-05-03 MED ORDER — APIXABAN 5 MG PO TABS
5.0000 mg | ORAL_TABLET | Freq: Two times a day (BID) | ORAL | Status: DC
  Administered 2022-05-03 – 2022-05-06 (×7): 5 mg via ORAL
  Filled 2022-05-03 (×7): qty 1

## 2022-05-03 MED ORDER — NAPROXEN 500 MG PO TABS: 500.0000 mg | ORAL_TABLET | Freq: Two times a day (BID) | ORAL | Status: DC | PRN

## 2022-05-03 MED ORDER — ALBUTEROL SULFATE HFA 108 (90 BASE) MCG/ACT IN AERS: 2.0000 | INHALATION_SPRAY | RESPIRATORY_TRACT | Status: DC | PRN

## 2022-05-03 MED ORDER — TRAZODONE HCL 50 MG PO TABS
50.0000 mg | ORAL_TABLET | Freq: Every day | ORAL | Status: DC
  Administered 2022-05-03 – 2022-05-05 (×3): 50 mg via ORAL
  Filled 2022-05-03 (×3): qty 1

## 2022-05-03 MED ORDER — HYDROXYZINE HCL 25 MG PO TABS: 25.0000 mg | ORAL_TABLET | Freq: Four times a day (QID) | ORAL | Status: DC | PRN

## 2022-05-03 MED ORDER — ROSUVASTATIN CALCIUM 20 MG PO TABS
20.0000 mg | ORAL_TABLET | Freq: Every day | ORAL | Status: DC
  Administered 2022-05-03 – 2022-05-05 (×3): 20 mg via ORAL
  Filled 2022-05-03 (×3): qty 1

## 2022-05-03 MED ORDER — TRAMADOL HCL 50 MG PO TABS
50.0000 mg | ORAL_TABLET | Freq: Four times a day (QID) | ORAL | Status: DC | PRN
  Administered 2022-05-03 – 2022-05-06 (×5): 50 mg via ORAL
  Filled 2022-05-03 (×5): qty 1

## 2022-05-03 MED ORDER — SENNOSIDES-DOCUSATE SODIUM 8.6-50 MG PO TABS
2.0000 | ORAL_TABLET | Freq: Every day | ORAL | Status: DC | PRN
  Administered 2022-05-04: 2 via ORAL
  Filled 2022-05-03: qty 2

## 2022-05-03 MED ORDER — GLIPIZIDE ER 5 MG PO TB24
10.0000 mg | ORAL_TABLET | Freq: Every day | ORAL | Status: DC
  Administered 2022-05-03 – 2022-05-06 (×4): 10 mg via ORAL
  Filled 2022-05-03 (×4): qty 2

## 2022-05-03 MED ORDER — CARVEDILOL 3.125 MG PO TABS
6.2500 mg | ORAL_TABLET | Freq: Two times a day (BID) | ORAL | Status: DC
  Administered 2022-05-03 – 2022-05-06 (×7): 6.25 mg via ORAL
  Filled 2022-05-03 (×7): qty 2

## 2022-05-03 MED ORDER — MAGNESIUM HYDROXIDE 400 MG/5ML PO SUSP: 30.0000 mL | Freq: Every day | ORAL | Status: DC | PRN

## 2022-05-03 MED ORDER — CLONIDINE HCL 0.1 MG PO TABS
0.1000 mg | ORAL_TABLET | Freq: Four times a day (QID) | ORAL | Status: DC
  Administered 2022-05-03 (×2): 0.1 mg via ORAL
  Filled 2022-05-03 (×2): qty 1

## 2022-05-03 NOTE — ED Notes (Signed)
Pt stated he doesn't feel good and something doesn't feel right with him as soon as he entered the unit. However didn't go into detail as to what and why just said some thing is not right.

## 2022-05-03 NOTE — ED Notes (Signed)
Pt is A & O x 4. Pt is oriented to the unit and provided with meal. Skin assessment/search has been done. Pt provided with walker to enable pt walk. Pt denies SI/HI/AVH. Will continue to monitor for safety.

## 2022-05-03 NOTE — ED Notes (Signed)
Pt sleeping in bed. RR even and unlabored. No noted distress. Will continue to monitor for safety

## 2022-05-03 NOTE — ED Notes (Signed)
Pt a/o, pleasant and cooperative. In dayroom watching tv with peers. Denies Si/ HI/ AVH. Denies s/s of withdrawal. No noted distress. Will continue to monitor for safety

## 2022-05-03 NOTE — ED Notes (Signed)
Patient is currently in his room, no distress noted, will continue to monitor patient for safety 

## 2022-05-03 NOTE — ED Notes (Signed)
Provider made aware of patient's extreme BLE weakness and being unsure he can use a walker, also notified of the wounds he has on his legs

## 2022-05-03 NOTE — ED Notes (Signed)
Pt in bed sleeping. RR even and unlabored. No noted distress. Will continue to monitor for safety

## 2022-05-03 NOTE — ED Provider Notes (Signed)
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 of infection. The lowest concentration of SARS-CoV-2 viral copies this assay can detect is 138 copies/mL. A negative result does not preclude SARS-Cov-2 infection and should not be used as the sole basis for treatment  or other patient management decisions. A negative result may occur with  improper specimen collection/handling, submission of specimen other than nasopharyngeal swab, presence of viral mutation(s) within the areas targeted by this assay, and inadequate number of viral copies(<138 copies/mL). A negative result must be combined with clinical observations, patient history, and epidemiological information. The expected result is Negative.  Fact Sheet for Patients:  EntrepreneurPulse.com.au  Fact Sheet for Healthcare Providers:  IncredibleEmployment.be  This test is no                          t yet approved or cleared by the [place] FDA and  has been authorized for detection and/or diagnosis of SARS-CoV-2 by FDA under an Emergency Use Authorization (EUA). This EUA will remain  in effect (meaning this test can be used) for the duration of the COVID-19 declaration under Section 564(b)(1) of the Act, 21 U.S.C.section 360bbb-3(b)(1), unless the authorization is terminated  or revoked sooner.       Influenza A by PCR 04/05/2022 NEGATIVE  NEGATIVE Final   Influenza B by PCR 04/05/2022 NEGATIVE  NEGATIVE Final   Comment: (NOTE) The Xpert Xpress SARS-CoV-2/FLU/RSV plus assay is intended as an aid in the diagnosis of influenza from Nasopharyngeal swab specimens and should not be used as a sole basis for treatment. Nasal washings and aspirates are unacceptable for Xpert Xpress SARS-CoV-2/FLU/RSV testing.  Fact Sheet for Patients: EntrepreneurPulse.com.au  Fact Sheet for Healthcare Providers: IncredibleEmployment.be  This test is not yet approved or cleared by the [place] FDA and has been authorized for detection and/or diagnosis of SARS-CoV-2 by FDA under an Emergency Use Authorization (EUA). This EUA will remain in effect (meaning this test can be used) for the duration of the COVID-19 declaration under  Section 564(b)(1) of the Act, 21 U.S.C. section 360bbb-3(b)(1), unless the authorization is terminated or revoked.     Resp Syncytial Virus by PCR 04/05/2022 NEGATIVE  NEGATIVE Final   Comment: (NOTE) Fact Sheet for Patients: EntrepreneurPulse.com.au  Fact Sheet for Healthcare Providers: IncredibleEmployment.be  This test is not yet approved or cleared by the [place] FDA and has been authorized for detection and/or diagnosis of SARS-CoV-2 by FDA under an Emergency Use Authorization (EUA). This EUA will remain in effect (meaning this test can be used) for the duration of the COVID-19 declaration under Section 564(b)(1) of the Act, 21 U.S.C. section 360bbb-3(b)(1), unless the authorization is terminated or revoked.  Performed at [facility], [place] [address], [place], [place] [address]    SARS Coronavirus 2 by RT PCR 04/06/2022 NEGATIVE  NEGATIVE Final   Comment: (NOTE) SARS-CoV-2 target nucleic acids are NOT DETECTED.  The SARS-CoV-2 RNA is generally detectable in upper respiratory specimens during the acute phase of infection. The lowest concentration of SARS-CoV-2 viral copies this assay can detect is 138 copies/mL. A negative result does not preclude SARS-Cov-2 infection and should not be used as the sole basis for treatment  or other patient management decisions. A negative result may occur with  improper specimen collection/handling, submission of specimen other than nasopharyngeal swab, presence of viral mutation(s) within the areas targeted by this assay, and inadequate number of viral copies(<138 copies/mL). A negative result must be combined with clinical observations, patient history, and epidemiological information. The expected result is Negative.  Fact Sheet for Patients:  EntrepreneurPulse.com.au  Fact Sheet for Healthcare Providers:  IncredibleEmployment.be  This  test is no                          t yet approved or cleared by the [place] FDA and  has been authorized for detection and/or diagnosis of SARS-CoV-2 by FDA under an Emergency Use Authorization (EUA). This EUA will remain  in effect (meaning this test can be used) for the duration of the COVID-19 declaration under Section 564(b)(1) of the Act, 21 U.S.C.section 360bbb-3(b)(1), unless the authorization is terminated  or revoked sooner.       Influenza A by PCR 04/06/2022 NEGATIVE  NEGATIVE Final   Influenza B by PCR 04/06/2022 NEGATIVE  NEGATIVE Final   Comment: (NOTE) The Xpert Xpress SARS-CoV-2/FLU/RSV plus assay is intended as an aid in the diagnosis of influenza from Nasopharyngeal swab specimens and should not be used as a sole basis for treatment. Nasal washings and aspirates are unacceptable for Xpert Xpress SARS-CoV-2/FLU/RSV testing.  Fact Sheet for Patients: EntrepreneurPulse.com.au  Fact Sheet for Healthcare Providers: IncredibleEmployment.be  This test is not yet approved or cleared by the [place] FDA and has been authorized for detection and/or diagnosis of SARS-CoV-2 by FDA under an Emergency Use Authorization (EUA). This EUA will remain in effect (meaning this test can be used) for the duration of the COVID-19 declaration under Section 564(b)(1) of the Act, 21 U.S.C. section 360bbb-3(b)(1), unless the authorization is terminated or revoked.     Resp Syncytial Virus by PCR 04/06/2022 NEGATIVE  NEGATIVE Final   Comment: (NOTE) Fact Sheet for Patients: EntrepreneurPulse.com.au  Fact Sheet for Healthcare Providers: IncredibleEmployment.be  This test is not yet approved or cleared by the [place] FDA and has been authorized for detection and/or diagnosis of SARS-CoV-2 by FDA under an Emergency Use Authorization (EUA). This EUA will remain in effect (meaning this test can be  used) for the duration of the COVID-19 declaration under Section 564(b)(1) of the Act, 21 U.S.C. section 360bbb-3(b)(1), unless the authorization is terminated or revoked.  Performed at [name], [place] [address], [place], [place] [address]    Sodium 04/06/2022 133 (L)  135 - 145 mmol/L Final   Potassium 04/06/2022 4.9  3.5 - 5.1 mmol/L Final   Chloride 04/06/2022 98  98 - 111 mmol/L Final   CO2 04/06/2022 25  22 - 32 mmol/L Final   Glucose, Bld 04/06/2022 146 (H)  70 - 99 mg/dL Final   Glucose reference range applies only to samples taken after fasting for at least 8 hours.   BUN 04/06/2022 29 (H)  8 - 23 mg/dL Final   Creatinine, Ser 04/06/2022 2.12 (H)  0.61 - 1.24 mg/dL Final   Calcium 04/06/2022 8.6 (L)  8.9 - 10.3 mg/dL Final   Total Protein 04/06/2022 8.1  6.5 - 8.1 g/dL Final   Albumin 04/06/2022 3.3 (L)  3.5 - 5.0 g/dL Final   AST 04/06/2022 17  15 - 41 U/L Final   ALT 04/06/2022 10  0 - 44 U/L Final   Alkaline  Phosphatase 04/06/2022 92  38 - 126 U/L Final   Total Bilirubin 04/06/2022 0.7  0.3 - 1.2 mg/dL Final   GFR, Estimated 04/06/2022 34 (L)  >60 mL/min Final   Comment: (NOTE) Calculated using the CKD-EPI Creatinine Equation (2021)    Anion gap 04/06/2022 10  5 - 15 Final   Performed at [facility], [place] [address], [place], [place] [address]   WBC 04/06/2022 11.8 (H)  4.0 - 10.5 K/uL Final   RBC 04/06/2022 4.13 (L)  4.22 - 5.81 MIL/uL Final   Hemoglobin 04/06/2022 11.6 (L)  13.0 - 17.0 g/dL Final   HCT 04/06/2022 37.4 (L)  39.0 - 52.0 % Final   MCV 04/06/2022 90.6  80.0 - 100.0 fL Final   MCH 04/06/2022 28.1  26.0 - 34.0 pg Final   MCHC 04/06/2022 31.0  30.0 - 36.0 g/dL Final   RDW 04/06/2022 17.0 (H)  11.5 - 15.5 % Final   Platelets 04/06/2022 309  150 - 400 K/uL Final   nRBC 04/06/2022 0.0  0.0 - 0.2 % Final   Neutrophils Relative % 04/06/2022 81  % Final   Neutro Abs 04/06/2022 9.5 (H)  1.7 - 7.7 K/uL Final    Lymphocytes Relative 04/06/2022 11  % Final   Lymphs Abs 04/06/2022 1.3  0.7 - 4.0 K/uL Final   Monocytes Relative 04/06/2022 7  % Final   Monocytes Absolute 04/06/2022 0.9  0.1 - 1.0 K/uL Final   Eosinophils Relative 04/06/2022 1  % Final   Eosinophils Absolute 04/06/2022 0.1  0.0 - 0.5 K/uL Final   Basophils Relative 04/06/2022 0  % Final   Basophils Absolute 04/06/2022 0.0  0.0 - 0.1 K/uL Final   Immature Granulocytes 04/06/2022 0  % Final   Abs Immature Granulocytes 04/06/2022 0.03  0.00 - 0.07 K/uL Final   Performed at [name], [place] [address], [place], [place] [address]   B Natriuretic Peptide 04/06/2022 39.0  0.0 - 100.0 pg/mL Final   Performed at [facility] [address] [place], [place] [address]   Troponin I (High Sensitivity) 04/06/2022 13  <18 ng/L Final   Comment: (NOTE) Elevated high sensitivity troponin I (hsTnI) values and significant  changes across serial measurements may suggest ACS but many other  chronic and acute conditions are known to elevate hsTnI results.  Refer to the "Links" section for chest pain algorithms and additional  guidance. Performed at [facility], [place] [address], [place], [place] [address]    Specimen Description 04/07/2022    Final                   Value:BLOOD BLOOD RIGHT ARM Performed at [facility] [address] [place], [place] [address]    Special Requests 04/07/2022    Final                   Value:BOTTLES DRAWN AEROBIC ONLY Blood Culture adequate volume Performed at [facility], [place] [address] [place], [place] [address]    Culture 04/07/2022    Final                   Value:NO GROWTH 5 DAYS Performed at [place] [address] [place], [place] [address]    Report Status 04/07/2022 04/12/2022 FINAL   Final   Specimen Description 04/07/2022    Final  Value:BLOOD BLOOD LEFT ARM Performed at [facility] [address], [place], [place] [address]    Special Requests 04/07/2022    Final                   Value:BOTTLES DRAWN AEROBIC ONLY Blood Culture adequate volume Performed at [facility], [place] [address] [place], [place] [address]    Culture 04/07/2022    Final                   Value:NO GROWTH 5 DAYS Performed at [facility] [address] [place], [place] [address]    Report Status 04/07/2022 04/12/2022 FINAL   Final   Lactic Acid, Venous 04/07/2022 1.3  0.5 - 1.9 mmol/L Final   Performed at [facility] [address] [place], [place] [address]   HIV Screen 4th Generation wRfx 04/07/2022 Non Reactive  Non Reactive Final   Performed at [name], [place] [address], [place], [place] [address]   WBC 04/07/2022 8.4  4.0 - 10.5 K/uL Final   RBC 04/07/2022 3.69 (L)  4.22 - 5.81 MIL/uL Final   Hemoglobin 04/07/2022 10.3 (L)  13.0 - 17.0 g/dL Final   HCT 04/07/2022 34.3 (L)  39.0 - 52.0 % Final   MCV 04/07/2022 93.0  80.0 - 100.0 fL Final   MCH 04/07/2022 27.9  26.0 - 34.0 pg Final   MCHC 04/07/2022 30.0  30.0 - 36.0 g/dL Final   RDW 04/07/2022 17.2 (H)  11.5 - 15.5 % Final   Platelets 04/07/2022 279  150 - 400 K/uL Final   nRBC 04/07/2022 0.0  0.0 - 0.2 % Final   Performed at [facility], [place] [address], [place],  [address]   Creatinine, Ser 04/07/2022 1.80 (H)  0.61 - 1.24 mg/dL Final   GFR, Estimated 04/07/2022 42 (L)  >60 mL/min Final   Comment: (NOTE) Calculated using the CKD-EPI Creatinine Equation (2021) Performed at [facility], [place] [address], [place], [place] [address]    Sodium 04/07/2022 134 (L)  135 - 145 mmol/L Final   Potassium 04/07/2022 4.5  3.5 - 5.1 mmol/L Final   Chloride 04/07/2022 100  98 - 111 mmol/L Final   CO2 04/07/2022 25  22 - 32 mmol/L Final   Glucose, Bld 04/07/2022 159 (H)  70 - 99 mg/dL Final   Glucose reference range  applies only to samples taken after fasting for at least 8 hours.   BUN 04/07/2022 31 (H)  8 - 23 mg/dL Final   Creatinine, Ser 04/07/2022 1.50 (H)  0.61 - 1.24 mg/dL Final   Calcium 04/07/2022 8.4 (L)  8.9 - 10.3 mg/dL Final   GFR, Estimated 04/07/2022 52 (L)  >60 mL/min Final   Comment: (NOTE) Calculated using the CKD-EPI Creatinine Equation (2021)    Anion gap 04/07/2022 9  5 - 15 Final   Performed at [facility], [place] [address] [place], [place] [address]   WBC 04/07/2022 6.8  4.0 - 10.5 K/uL Final   RBC 04/07/2022 3.57 (L)  4.22 - 5.81 MIL/uL Final   Hemoglobin 04/07/2022 9.9 (L)  13.0 - 17.0 g/dL Final   HCT 04/07/2022 32.3 (L)  39.0 - 52.0 % Final   MCV 04/07/2022 90.5  80.0 - 100.0 fL Final   MCH 04/07/2022 27.7  26.0 - 34.0 pg Final   MCHC 04/07/2022 30.7  30.0 - 36.0 g/dL Final  RDW 04/07/2022 16.9 (H)  11.5 - 15.5 % Final   Platelets 04/07/2022 264  150 - 400 K/uL Final   nRBC 04/07/2022 0.0  0.0 - 0.2 % Final   Neutrophils Relative % 04/07/2022 91  % Final   Neutro Abs 04/07/2022 6.2  1.7 - 7.7 K/uL Final   Lymphocytes Relative 04/07/2022 7  % Final   Lymphs Abs 04/07/2022 0.5 (L)  0.7 - 4.0 K/uL Final   Monocytes Relative 04/07/2022 2  % Final   Monocytes Absolute 04/07/2022 0.1  0.1 - 1.0 K/uL Final   Eosinophils Relative 04/07/2022 0  % Final   Eosinophils Absolute 04/07/2022 0.0  0.0 - 0.5 K/uL Final   Basophils Relative 04/07/2022 0  % Final   Basophils Absolute 04/07/2022 0.0  0.0 - 0.1 K/uL Final   Immature Granulocytes 04/07/2022 0  % Final   Abs Immature Granulocytes 04/07/2022 0.03  0.00 - 0.07 K/uL Final   Performed at [facility], [place] [address], [place], [place] [address]   Total CK 04/07/2022 164  49 - 397 U/L Final   Performed at [facility], [place] [address], [place], [place] [address]   Hgb A1c MFr Bld 04/07/2022 6.3 (H)  4.8 - 5.6 % Final   Comment: (NOTE) Pre diabetes:           5.7%-6.4%  Diabetes:              >6.4%  Glycemic control for   <7.0% adults with diabetes    Mean Plasma Glucose 04/07/2022 134.11  mg/dL Final   Performed at [name], [place] [address], [place], [place] [address]   Glucose-Capillary 04/07/2022 156 (H)  70 - 99 mg/dL Final   Glucose reference range applies only to samples taken after fasting for at least 8 hours.   Glucose-Capillary 04/07/2022 168 (H)  70 - 99 mg/dL Final   Glucose reference range applies only to samples taken after fasting for at least 8 hours.   Glucose-Capillary 04/07/2022 148 (H)  70 - 99 mg/dL Final   Glucose reference range applies only to samples taken after fasting for at least 8 hours.   WBC 04/08/2022 6.2  4.0 - 10.5 K/uL Final   RBC 04/08/2022 3.57 (L)  4.22 - 5.81 MIL/uL Final   Hemoglobin 04/08/2022 9.9 (L)  13.0 - 17.0 g/dL Final   HCT 04/08/2022 31.7 (L)  39.0 - 52.0 % Final   MCV 04/08/2022 88.8  80.0 - 100.0 fL Final   MCH 04/08/2022 27.7  26.0 - 34.0 pg Final   MCHC 04/08/2022 31.2  30.0 - 36.0 g/dL Final   RDW 04/08/2022 16.6 (H)  11.5 - 15.5 % Final   Platelets 04/08/2022 270  150 - 400 K/uL Final   nRBC 04/08/2022 0.0  0.0 - 0.2 % Final   Performed at [facility], [place] [address], [place], [place] [address]   Sodium 04/08/2022 134 (L)  135 - 145 mmol/L Final   Potassium 04/08/2022 4.0  3.5 - 5.1 mmol/L Final   Chloride 04/08/2022 98  98 - 111 mmol/L Final   CO2 04/08/2022 26  22 - 32 mmol/L Final   Glucose, Bld 04/08/2022 115 (H)  70 - 99 mg/dL Final   Glucose reference range applies only to samples taken after fasting for at least 8 hours.   BUN 04/08/2022 29 (H)  8 - 23 mg/dL Final   Creatinine, Ser 04/08/2022 1.06  0.61 - 1.24  mg/dL Final   Calcium 04/08/2022 8.3 (L)  8.9 - 10.3 mg/dL Final   GFR, Estimated 04/08/2022 >60  >60 mL/min Final   Comment: (NOTE) Calculated using the CKD-EPI Creatinine Equation (2021)    Anion gap 04/08/2022 10  5 - 15 Final    Performed at [facility], [place] [address], [place], [place] [address]   Glucose-Capillary 04/07/2022 183 (H)  70 - 99 mg/dL Final   Glucose reference range applies only to samples taken after fasting for at least 8 hours.   Comment 1 04/07/2022 Notify RN   Final   Comment 2 04/07/2022 Document in Chart   Final   Glucose-Capillary 04/08/2022 117 (H)  70 - 99 mg/dL Final   Glucose reference range applies only to samples taken after fasting for at least 8 hours.  Admission on 03/09/2022, Discharged on 03/09/2022  Component Date Value Ref Range Status   Sodium 03/09/2022 134 (L)  135 - 145 mmol/L Final   Potassium 03/09/2022 4.0  3.5 - 5.1 mmol/L Final   Chloride 03/09/2022 98  98 - 111 mmol/L Final   CO2 03/09/2022 27  22 - 32 mmol/L Final   Glucose, Bld 03/09/2022 151 (H)  70 - 99 mg/dL Final   Glucose reference range applies only to samples taken after fasting for at least 8 hours.   BUN 03/09/2022 13  8 - 23 mg/dL Final   Creatinine, Ser 03/09/2022 1.52 (H)  0.61 - 1.24 mg/dL Final   Calcium 03/09/2022 9.1  8.9 - 10.3 mg/dL Final   Total Protein 03/09/2022 7.3  6.5 - 8.1 g/dL Final   Albumin 03/09/2022 3.3 (L)  3.5 - 5.0 g/dL Final   AST 03/09/2022 17  15 - 41 U/L Final   ALT 03/09/2022 11  0 - 44 U/L Final   Alkaline Phosphatase 03/09/2022 105  38 - 126 U/L Final   Total Bilirubin 03/09/2022 0.5  0.3 - 1.2 mg/dL Final   GFR, Estimated 03/09/2022 51 (L)  >60 mL/min Final   Comment: (NOTE) Calculated using the CKD-EPI Creatinine Equation (2021)    Anion gap 03/09/2022 9  5 - 15 Final   Performed at [place] [address], [place], [place] [address]   WBC 03/09/2022 8.2  4.0 - 10.5 K/uL Final   RBC 03/09/2022 4.26  4.22 - 5.81 MIL/uL Final   Hemoglobin 03/09/2022 11.5 (L)  13.0 - 17.0 g/dL Final   HCT 03/09/2022 37.0 (L)  39.0 - 52.0 % Final   MCV 03/09/2022 86.9  80.0 - 100.0 fL Final   MCH 03/09/2022 27.0  26.0 - 34.0 pg Final   MCHC 03/09/2022 31.1   30.0 - 36.0 g/dL Final   RDW 03/09/2022 17.1 (H)  11.5 - 15.5 % Final   Platelets 03/09/2022 350  150 - 400 K/uL Final   nRBC 03/09/2022 0.0  0.0 - 0.2 % Final   Performed at [name], [place] [address] [place], [place] [address]   Glucose-Capillary 03/09/2022 137 (H)  70 - 99 mg/dL Final   Glucose reference range applies only to samples taken after fasting for at least 8 hours.  Admission on 01/10/2022, Discharged on 01/10/2022  Component Date Value Ref Range Status   Glucose-Capillary 01/10/2022 213 (H)  70 - 99 mg/dL Final   Glucose reference range applies only to samples taken after fasting for at least 8 hours.   Sodium 01/10/2022 133 (L)  135 - 145 mmol/L Final  Potassium 01/10/2022 4.0  3.5 - 5.1 mmol/L Final   Chloride 01/10/2022 102  98 - 111 mmol/L Final   CO2 01/10/2022 20 (L)  22 - 32 mmol/L Final   Glucose, Bld 01/10/2022 228 (H)  70 - 99 mg/dL Final   Glucose reference range applies only to samples taken after fasting for at least 8 hours.   BUN 01/10/2022 20  8 - 23 mg/dL Final   Creatinine, Ser 01/10/2022 2.41 (H)  0.61 - 1.24 mg/dL Final   Calcium 01/10/2022 8.8 (L)  8.9 - 10.3 mg/dL Final   Total Protein 01/10/2022 7.1  6.5 - 8.1 g/dL Final   Albumin 01/10/2022 3.3 (L)  3.5 - 5.0 g/dL Final   AST 01/10/2022 20  15 - 41 U/L Final   ALT 01/10/2022 8  0 - 44 U/L Final   Alkaline Phosphatase 01/10/2022 79  38 - 126 U/L Final   Total Bilirubin 01/10/2022 0.3  0.3 - 1.2 mg/dL Final   GFR, Estimated 01/10/2022 29 (L)  >60 mL/min Final   Comment: (NOTE) Calculated using the CKD-EPI Creatinine Equation (2021)    Anion gap 01/10/2022 11  5 - 15 Final   Performed at [place] [address] [place], [place] [address]   WBC 01/10/2022 9.0  4.0 - 10.5 K/uL Final   RBC 01/10/2022 3.84 (L)  4.22 - 5.81 MIL/uL Final   Hemoglobin 01/10/2022 10.5 (L)  13.0 - 17.0 g/dL Final   HCT 01/10/2022 34.3 (L)  39.0 - 52.0 % Final   MCV 01/10/2022 89.3  80.0 - 100.0 fL  Final   MCH 01/10/2022 27.3  26.0 - 34.0 pg Final   MCHC 01/10/2022 30.6  30.0 - 36.0 g/dL Final   RDW 01/10/2022 15.8 (H)  11.5 - 15.5 % Final   Platelets 01/10/2022 379  150 - 400 K/uL Final   nRBC 01/10/2022 0.0  0.0 - 0.2 % Final   Neutrophils Relative % 01/10/2022 78  % Final   Neutro Abs 01/10/2022 6.9  1.7 - 7.7 K/uL Final   Lymphocytes Relative 01/10/2022 15  % Final   Lymphs Abs 01/10/2022 1.3  0.7 - 4.0 K/uL Final   Monocytes Relative 01/10/2022 6  % Final   Monocytes Absolute 01/10/2022 0.6  0.1 - 1.0 K/uL Final   Eosinophils Relative 01/10/2022 1  % Final   Eosinophils Absolute 01/10/2022 0.1  0.0 - 0.5 K/uL Final   Basophils Relative 01/10/2022 0  % Final   Basophils Absolute 01/10/2022 0.0  0.0 - 0.1 K/uL Final   Immature Granulocytes 01/10/2022 0  % Final   Abs Immature Granulocytes 01/10/2022 0.02  0.00 - 0.07 K/uL Final   Performed at [name], [place] [address] [place], [place] [address]   Lactic Acid, Venous 01/10/2022 3.7 (HH)  0.5 - 1.9 mmol/L Final   Comment: CRITICAL RESULT CALLED TO, READ BACK BY AND VERIFIED WITH C.ROE,RN. [id number] 01/10/22.PAIT,L Performed at [name], [place] [address], [place], [place] [address]    Prothrombin Time 01/10/2022 15.3 (H)  11.4 - 15.2 seconds Final   INR 01/10/2022 1.2  0.8 - 1.2 Final   Comment: (NOTE) INR goal varies based on device and disease states. Performed at [name], [place] [address], [place], [place] [address]    Specimen Description 01/10/2022 BLOOD RIGHT ANTECUBITAL   Final   Special Requests 01/10/2022 BOTTLES DRAWN AEROBIC AND ANAEROBIC Blood Culture adequate volume   Final   Culture  01/10/2022    Final                   Value:NO GROWTH 5 DAYS Performed at [name], [place] [address], [place], [place] [address]    Report Status 01/10/2022 01/15/2022 FINAL   Final   Specimen Description 01/10/2022 BLOOD SITE NOT SPECIFIED   Final   Special Requests 01/10/2022 BOTTLES DRAWN AEROBIC  AND ANAEROBIC Blood Culture results may not be optimal due to an excessive volume of blood received in culture bottles   Final   Culture 01/10/2022    Final                   Value:NO GROWTH 5 DAYS Performed at [name], [place] [address], [place], [place], [place] [address]    Report Status 01/10/2022 01/15/2022 FINAL   Final   Lactic Acid, Venous 01/10/2022 2.4 (HH)  0.5 - 1.9 mmol/L Final   Comment: CRITICAL VALUE NOTED. VALUE IS CONSISTENT WITH PREVIOUSLY REPORTED/CALLED VALUE Performed at [name], [place] [address] [place], [place] [address]     Allergies: Patient has no known allergies.  Medications:  Facility Ordered Medications  Medication   metFORMIN (GLUCOPHAGE) tablet 1,000 mg   sertraline (ZOLOFT) tablet 75 mg   famotidine (PEPCID) tablet 20 mg   clopidogrel (PLAVIX) tablet 75 mg   carvedilol (COREG) tablet 6.25 mg   albuterol (VENTOLIN HFA) 108 (90 Base) MCG/ACT inhaler 2 puff   apixaban (ELIQUIS) tablet 5 mg   gabapentin (NEURONTIN) capsule 1,200 mg   rosuvastatin (CRESTOR) tablet 20 mg   senna-docusate (Senokot-S) tablet 2 tablet   dicyclomine (BENTYL) tablet 20 mg   hydrOXYzine (ATARAX) tablet 25 mg   loperamide (IMODIUM) capsule 2-4 mg   methocarbamol (ROBAXIN) tablet 500 mg   naproxen (NAPROSYN) tablet 500 mg   ondansetron (ZOFRAN-ODT) disintegrating tablet 4 mg   cloNIDine (CATAPRES) tablet 0.1 mg   Followed by   Derrill Memo ON 05/05/2022] cloNIDine (CATAPRES) tablet 0.1 mg   Followed by   Derrill Memo ON 05/08/2022] cloNIDine (CATAPRES) tablet 0.1 mg   acetaminophen (TYLENOL) tablet 650 mg   alum & mag hydroxide-simeth (MAALOX/MYLANTA) 200-200-20 MG/5ML suspension 30 mL   magnesium hydroxide (MILK OF MAGNESIA) suspension 30 mL   nicotine (NICODERM CQ - dosed in mg/24 hours) patch 21 mg   traMADol (ULTRAM) tablet 50 mg   glipiZIDE (GLUCOTROL XL) 24 hr tablet 10 mg   traZODone (DESYREL) tablet 50 mg   PTA Medications  Medication Sig   rosuvastatin (CRESTOR)  20 MG tablet Take 20 mg by mouth at bedtime.   gabapentin (NEURONTIN) 600 MG tablet Take 1,200 mg by mouth 3 (three) times daily.   blood glucose meter kit and supplies KIT Dispense based on patient and insurance preference. Use up to four times daily as directed. (FOR ICD-9 250.00, 250.01).   glipiZIDE (GLUCOTROL XL) 10 MG 24 hr tablet TAKE 1 TABLET (10 MG TOTAL) BY MOUTH DAILY. (Patient taking differently: Take 10 mg by mouth daily.)   famotidine (PEPCID) 20 MG tablet TAKE 1 TABLET (20 MG TOTAL) BY MOUTH TWO TIMES DAILY. (Patient taking differently: Take 20 mg by mouth daily as needed for heartburn.)   carvedilol (COREG) 6.25 MG tablet TAKE 1 TABLET (6.25 MG TOTAL) BY MOUTH TWO TIMES DAILY WITH A MEAL. (Patient taking differently: Take 6.25 mg by mouth 2 (two) times daily with a meal.)   clopidogrel (PLAVIX) 75 MG tablet Take 75 mg by mouth daily.  polyethylene glycol (MIRALAX / GLYCOLAX) 17 g packet Take 17 g by mouth daily as needed for mild constipation.   Dulaglutide (TRULICITY) 1.5 0000000 SOPN Inject 1.5 mg into the skin once a week. Friday   sertraline (ZOLOFT) 50 MG tablet Take 75 mg by mouth daily. Taking 1 & 1/2 tabs daily = 75 mg 7 DS delivered on 04-01-22   traZODone (DESYREL) 50 MG tablet Take 50 mg by mouth at bedtime.   thiamine (VITAMIN B-1) 100 MG tablet Take 100 mg by mouth daily.   sacubitril-valsartan (ENTRESTO) 24-26 MG Take 1 tablet by mouth 2 (two) times daily.   metFORMIN (GLUCOPHAGE) 1000 MG tablet Take 1,000 mg by mouth 2 (two) times daily with a meal.   senna-docusate (SENOKOT-S) 8.6-50 MG tablet Take 2 tablets by mouth daily as needed for mild constipation.   tiZANidine (ZANAFLEX) 4 MG capsule Take 4 mg by mouth 2 (two) times daily as needed for muscle spasms.   traMADol (ULTRAM) 50 MG tablet Take 50 mg by mouth every 6 (six) hours as needed for moderate pain.   albuterol (VENTOLIN HFA) 108 (90 Base) MCG/ACT inhaler Inhale 2 puffs into the lungs every 4 (four) hours  as needed for wheezing or shortness of breath.   apixaban (ELIQUIS) 5 MG TABS tablet Take 1 tablet (5 mg total) by mouth 2 (two) times daily.    Long Term Goals: Improvement in symptoms so as ready for discharge  Short Term Goals: Patient will verbalize feelings in meetings with treatment team members., Patient will attend at least of 50% of the groups daily., Pt will complete the PHQ9 on admission, day 3 and discharge., Patient will participate in completing the [place], and Patient will take medications as prescribed daily.  Medical Decision Making  Ardin Niel is a 67 y/o male with a history of methamphetamine use disorder, major depressive disorder, generalized anxiety disorder, opioid use disorder, auditory hallucinations, suicidal ideation and bipolar 1 disorder who initially presented to [facility] ED voluntarily by [employer] due to auditory hallucinations and methamphetamine use.      Recommendations  Based on my evaluation the patient does not appear to have an emergency medical condition. Patient will be admitted to [name] for crisis management, safety and stabilization.  Lucia Bitter, NP 05/03/22  7:08 AM

## 2022-05-03 NOTE — Group Note (Signed)
Group Topic: Recovery Basics  Group Date: 05/03/2022 Start Time: 0900 End Time: 1000 Facilitators: Parks Neptune, NT  Department: [facility]  Number of Participants: 8  Group Focus: daily focus and substance abuse education Treatment Modality:  Psychoeducation Interventions utilized were clarification, group exercise, and support Purpose: enhance coping skills and increase insight  Name: [name] Date of Birth: [birth year]  MR: [id number]    Level of Participation: moderate Quality of Participation: engaged Interactions with others: positive Mood/Affect: quite Triggers (if applicable): n/a Cognition: coherent/clear Progress: Moderate Response: n/a Plan: follow-up needed  Patients Problems:  Patient Active Problem List   Diagnosis Date Noted   Methamphetamine-induced mood disorder ([place]) 05/03/2022   Methamphetamine use disorder, severe, dependence ([place]) 05/02/2022   Amphetamine and psychostimulant-induced psychosis with hallucinations ([place]) 05/02/2022   Chest pain 04/13/2022   AKI (acute kidney injury) ([place]) 04/13/2022   Elevated troponin 04/13/2022   Lactic acidosis 04/13/2022   Heart failure with reduced ejection fraction ([place]) 04/13/2022   History of pulmonary embolism 04/13/2022   History of pneumonia 04/13/2022   Polysubstance abuse ([place]) 04/13/2022   AAA (abdominal aortic aneurysm) ([place]) 04/13/2022   Bipolar 1 disorder ([place]) 04/07/2022   Community acquired pneumonia of right lower lobe of lung 04/07/2022   Substance induced mood disorder ([place]) 04/07/2022   Suicidal ideation 04/06/2022   Right lower lobe pneumonia 04/06/2022   RLL pneumonia 04/06/2022   Hallucination 03/09/2022   MDD (major depressive disorder) 03/09/2022   Acute pulmonary embolism without acute cor pulmonale (HCC)    Non-ST elevation (NSTEMI) myocardial infarction [facility])    Coronary artery disease involving native coronary artery of native heart without  angina pectoris    Cardiac arrest ([place]) 03/02/2020   Severe episode of recurrent major depressive disorder, without psychotic features ([place])    Opioid use disorder, severe, in sustained remission ([place]) 05/25/2015   Benzodiazepine abuse ([place]) 05/25/2015   Cannabis use disorder, moderate, dependence ([place]) 05/25/2015   MDD (major depressive disorder), recurrent episode, severe ([place]) 05/23/2015   Chronic pain syndrome 05/23/2015   GAD (generalized anxiety disorder) 05/23/2015   CVA (cerebral infarction) 10/15/2013   Hypertension 10/15/2013   Controlled type 2 diabetes mellitus without complication, without long-term current use of insulin ([place]) 10/15/2013

## 2022-05-03 NOTE — ED Notes (Signed)
Received patient this morning lying in bed.  He was disheveled and malodorous with dirty scabbed feet and long toenails.  Patient has scabs on calves from old scratches and possible stasis wounds.  They are all in good healing stages.  No evidence of oozing or infection.  Patients bloodwork drawn this morning.  He was assisted into shower and helped with soaking his feet.  Patient able to shower self once he is set up however requires assistance as he is a high falls risk.  Patients lower extremities are weak and he is using a walker.  Patient assisted with dressing and RN clipped elongated toenails.  Patient then went to day room to eat breakfast.  He attended group but was not participant and halfway through fell asleep with head on the table.  Patient then went to room to rest.  Will continue to monitor and assist when needed.  Will monitor and provide as safe environment.  Patient reminded to call for assistance in order to prevent falls on unit.

## 2022-05-03 NOTE — ED Notes (Signed)
Pt on unit in wheelchair. A/O. Denies SI/HI,AVH. Skin dry and flaky, venous stasis ulcers noted on bilateral lower extremities in various healing stages. 1+ edema noted in bilateral lower extremities. Pt states he ambulates with a Rolator and can not stand for long periods of time. Pt ambulated from day room to his assigned room on unit.  Oriented pt to unit. No noted distress. Will continue to monitor for safety

## 2022-05-03 NOTE — ED Provider Notes (Signed)
Behavioral Health Progress Note  Date and Time: 05/03/2022 3:02 PM Name: [name] MRN:  [id number]  Subjective:   Delano Artuso is a 67 y/o male with a history of methamphetamine use disorder, major depressive disorder, generalized anxiety disorder, opioid use disorder, auditory hallucinations, suicidal ideation and bipolar 1 disorder who initially presented to [facility] voluntarily by [facility] due to auditory hallucinations and methamphetamine use.  Patient was evaluated by Britt Bolognese Adegbola-NP and recommended for treatment at [facility] and patient was transferred to [facility] ED to [facility] for detox treatment.   Patient has an ACTT- [facility] Phone [phone]  On assessment today patient is Aox4. Patient reports that he wants help because he has been having AH for the last 6 months that have worsened. He reports that they initially started as one voice and become multiple. He recognized the first voice as someone he knows from real life. He reports that the voice tells him it will shoot him. He reports that he believed that the meth was causing the AH because he never had it before he started using meth, less than 1 year ago. Patient reports that due to this insight he changed from smoking to snorting meth and this decreased his use in half. He reports that in the last 3 months he has also been having AH. He reports they started at night but have now become more frequent. He reports that he started hearing things outside his window, and would look out and see people standing in his window. Patient reports that this has led to him staying in his room and locking the door. He reports that he used to at least get up and watch TV but he is no loger doing this because he feels safer in his room. He reports that his last meth use was approx. 3/4-3/5 and he has not had VH since around 3/6 and no AH in the last 24hours. He reports that this past 4 days it the longest he has been  sober from meth.   He denies SI and endorses that his previous SI was because he thought that would help him get help faster. He denies HI and denies any current AVH.   Patient reports he does not recall dx of Bipolar and does not know his medications, but gets them in a pill pack from his ACTT. He reports that he had been telling his ACTT the last 4-5 weeks he was hearing voices and the provider intended to start him on and LAI 3/8. Patient reports he has no idea what the LAI was.   Diagnosis:  Final diagnoses:  Methamphetamine abuse ([place])  Severe episode of recurrent major depressive disorder, with psychotic features ([place])    Total Time spent with patient: 30 minutes  Past Psychiatric History:  Cannabis abuse, opioid use dx INPT: 4x, reports all for Opioid abuse, endorses that he has never had a SA before Past Medical History: AAA, PR, HFrEF, PE, NSTEMI Family History: Unknown Family Psychiatric  History: Unknown Social History:  -lives alone - ACTT visit weekly  Additional Social History:                         Sleep: Good  Appetite:  Good  Current Medications:  Current Facility-Administered Medications  Medication Dose Route Frequency Provider Last Rate Last Admin   acetaminophen (TYLENOL) tablet 650 mg  650 mg Oral Q6H PRN Bobbitt, Lennie Muckle, NP  albuterol (VENTOLIN HFA) 108 (90 Base) MCG/ACT inhaler 2 puff  2 puff Inhalation Q4H PRN Bobbitt, Shalon E, NP       alum & mag hydroxide-simeth (MAALOX/MYLANTA) 200-200-20 MG/5ML suspension 30 mL  30 mL Oral Q4H PRN Bobbitt, Shalon E, NP       apixaban (ELIQUIS) tablet 5 mg  5 mg Oral BID Bobbitt, Shalon E, NP   5 mg at 05/03/22 1016   carvedilol (COREG) tablet 6.25 mg  6.25 mg Oral BID WC Bobbitt, Shalon E, NP   6.25 mg at 05/03/22 [id number]   cloNIDine (CATAPRES) tablet 0.1 mg  0.1 mg Oral QID Bobbitt, Shalon E, NP   0.1 mg at 05/03/22 1449   Followed by   Derrill Memo ON 05/05/2022] cloNIDine (CATAPRES) tablet 0.1 mg  0.1  mg Oral BH-qamhs Bobbitt, Shalon E, NP       Followed by   Derrill Memo ON 05/08/2022] cloNIDine (CATAPRES) tablet 0.1 mg  0.1 mg Oral QAC breakfast Bobbitt, Shalon E, NP       clopidogrel (PLAVIX) tablet 75 mg  75 mg Oral Daily Bobbitt, Shalon E, NP   75 mg at 05/03/22 1016   dicyclomine (BENTYL) tablet 20 mg  20 mg Oral Q6H PRN Bobbitt, Shalon E, NP       famotidine (PEPCID) tablet 20 mg  20 mg Oral Daily PRN Bobbitt, Shalon E, NP       gabapentin (NEURONTIN) capsule 1,200 mg  1,200 mg Oral TID Bobbitt, Shalon E, NP   1,200 mg at 05/03/22 1016   glipiZIDE (GLUCOTROL XL) 24 hr tablet 10 mg  10 mg Oral Daily Bobbitt, Shalon E, NP   10 mg at 05/03/22 1017   hydrOXYzine (ATARAX) tablet 25 mg  25 mg Oral Q6H PRN Bobbitt, Shalon E, NP       loperamide (IMODIUM) capsule 2-4 mg  2-4 mg Oral PRN Bobbitt, Shalon E, NP       magnesium hydroxide (MILK OF MAGNESIA) suspension 30 mL  30 mL Oral Daily PRN Bobbitt, Shalon E, NP       metFORMIN (GLUCOPHAGE) tablet 1,000 mg  1,000 mg Oral BID WC Bobbitt, Shalon E, NP   1,000 mg at 05/03/22 [id number]   methocarbamol (ROBAXIN) tablet 500 mg  500 mg Oral Q8H PRN Bobbitt, Shalon E, NP       naproxen (NAPROSYN) tablet 500 mg  500 mg Oral BID PRN Bobbitt, Shalon E, NP       nicotine (NICODERM CQ - dosed in mg/24 hours) patch 21 mg  21 mg Transdermal Daily Bobbitt, Shalon E, NP   21 mg at 05/03/22 1016   ondansetron (ZOFRAN-ODT) disintegrating tablet 4 mg  4 mg Oral Q6H PRN Bobbitt, Shalon E, NP       rosuvastatin (CRESTOR) tablet 20 mg  20 mg Oral QHS Bobbitt, Shalon E, NP       senna-docusate (Senokot-S) tablet 2 tablet  2 tablet Oral Daily PRN Bobbitt, Shalon E, NP       sertraline (ZOLOFT) tablet 75 mg  75 mg Oral Daily Bobbitt, Shalon E, NP   75 mg at 05/03/22 1016   traMADol (ULTRAM) tablet 50 mg  50 mg Oral Q6H PRN Bobbitt, Shalon E, NP       traZODone (DESYREL) tablet 50 mg  50 mg Oral QHS Bobbitt, Shalon E, NP       Current Outpatient Medications  Medication Sig  Dispense Refill   apixaban (ELIQUIS) 5 MG TABS tablet Take 1 tablet (5  mg total) by mouth 2 (two) times daily.     carvedilol (COREG) 6.25 MG tablet TAKE 1 TABLET (6.25 MG TOTAL) BY MOUTH TWO TIMES DAILY WITH A MEAL. (Patient taking differently: Take 6.25 mg by mouth 2 (two) times daily with a meal.) 60 tablet 0   clopidogrel (PLAVIX) 75 MG tablet Take 75 mg by mouth daily.     Dulaglutide (TRULICITY) 1.5 0000000 SOPN Inject 1.5 mg into the skin once a week. Friday     gabapentin (NEURONTIN) 600 MG tablet Take 1,200 mg by mouth 3 (three) times daily.     glipiZIDE (GLUCOTROL XL) 10 MG 24 hr tablet TAKE 1 TABLET (10 MG TOTAL) BY MOUTH DAILY. (Patient taking differently: Take 10 mg by mouth daily.) 30 tablet 0   metFORMIN (GLUCOPHAGE) 1000 MG tablet Take 1,000 mg by mouth 2 (two) times daily with a meal.     polyethylene glycol (MIRALAX / GLYCOLAX) 17 g packet Take 17 g by mouth daily as needed for mild constipation.     rosuvastatin (CRESTOR) 20 MG tablet Take 20 mg by mouth at bedtime.     sacubitril-valsartan (ENTRESTO) 24-26 MG Take 1 tablet by mouth 2 (two) times daily.     senna-docusate (SENOKOT-S) 8.6-50 MG tablet Take 2 tablets by mouth daily as needed for mild constipation.     sertraline (ZOLOFT) 50 MG tablet Take 75 mg by mouth daily. Taking 1 & 1/2 tabs daily = 75 mg 7 DS delivered on 04-01-22     thiamine (VITAMIN B-1) 100 MG tablet Take 100 mg by mouth daily.     tiZANidine (ZANAFLEX) 4 MG capsule Take 4 mg by mouth 2 (two) times daily as needed for muscle spasms.     traMADol (ULTRAM) 50 MG tablet Take 50 mg by mouth every 6 (six) hours as needed for moderate pain.     traZODone (DESYREL) 50 MG tablet Take 50 mg by mouth at bedtime.     blood glucose meter kit and supplies KIT Dispense based on patient and insurance preference. Use up to four times daily as directed. (FOR ICD-9 250.00, 250.01). 1 each 0    Labs  Lab Results:  Admission on 05/03/2022  Component Date Value Ref  Range Status   Sodium 05/03/2022 132 (L)  135 - 145 mmol/L Final   Potassium 05/03/2022 4.1  3.5 - 5.1 mmol/L Final   Chloride 05/03/2022 96 (L)  98 - 111 mmol/L Final   CO2 05/03/2022 28  22 - 32 mmol/L Final   Glucose, Bld 05/03/2022 148 (H)  70 - 99 mg/dL Final   Glucose reference range applies only to samples taken after fasting for at least 8 hours.   BUN 05/03/2022 20  8 - 23 mg/dL Final   Creatinine, Ser 05/03/2022 1.11  0.61 - 1.24 mg/dL Final   Calcium 05/03/2022 8.4 (L)  8.9 - 10.3 mg/dL Final   Total Protein 05/03/2022 6.2 (L)  6.5 - 8.1 g/dL Final   Albumin 05/03/2022 2.8 (L)  3.5 - 5.0 g/dL Final   AST 05/03/2022 14 (L)  15 - 41 U/L Final   ALT 05/03/2022 11  0 - 44 U/L Final   Alkaline Phosphatase 05/03/2022 88  38 - 126 U/L Final   Total Bilirubin 05/03/2022 0.4  0.3 - 1.2 mg/dL Final   GFR, Estimated 05/03/2022 >60  >60 mL/min Final   Comment: (NOTE) Calculated using the CKD-EPI Creatinine Equation (2021)    Anion gap 05/03/2022 8  5 -  15 Final   Performed at [facility], [place] [address] [place], [place] [address]   Cholesterol 05/03/2022 143  0 - 200 mg/dL Final   Triglycerides 05/03/2022 221 (H)  <150 mg/dL Final   HDL 05/03/2022 35 (L)  >40 mg/dL Final   Total CHOL/HDL Ratio 05/03/2022 4.1  RATIO Final   VLDL 05/03/2022 44 (H)  0 - 40 mg/dL Final   LDL Cholesterol 05/03/2022 64  0 - 99 mg/dL Final   Comment:        Total Cholesterol/HDL:CHD Risk Coronary Heart Disease Risk Table                     Men   Women  1/2 Average Risk   3.4   3.3  Average Risk       5.0   4.4  2 X Average Risk   9.6   7.1  3 X Average Risk  23.4   11.0        Use the calculated Patient Ratio above and the CHD Risk Table to determine the patient's CHD Risk.        ATP III CLASSIFICATION (LDL):  <100     mg/dL   Optimal  100-129  mg/dL   Near or Above                    Optimal  130-159  mg/dL   Borderline  160-189  mg/dL   High  >190     mg/dL   Very High Performed at  [facility] [address], [place], [place] [address]    TSH 05/03/2022 1.582  0.350 - 4.500 uIU/mL Final   Comment: Performed by a 3rd Generation assay with a functional sensitivity of <=0.01 uIU/mL. Performed at [facility], [place] [address] [place], [place] [address]   Admission on 05/01/2022, Discharged on 05/03/2022  Component Date Value Ref Range Status   Sodium 05/01/2022 135  135 - 145 mmol/L Final   Potassium 05/01/2022 4.6  3.5 - 5.1 mmol/L Final   Chloride 05/01/2022 100  98 - 111 mmol/L Final   CO2 05/01/2022 21 (L)  22 - 32 mmol/L Final   Glucose, Bld 05/01/2022 188 (H)  70 - 99 mg/dL Final   Glucose reference range applies only to samples taken after fasting for at least 8 hours.   BUN 05/01/2022 29 (H)  8 - 23 mg/dL Final   Creatinine, Ser 05/01/2022 2.04 (H)  0.61 - 1.24 mg/dL Final   Calcium 05/01/2022 8.9  8.9 - 10.3 mg/dL Final   Total Protein 05/01/2022 6.5  6.5 - 8.1 g/dL Final   Albumin 05/01/2022 2.9 (L)  3.5 - 5.0 g/dL Final   AST 05/01/2022 17  15 - 41 U/L Final   ALT 05/01/2022 13  0 - 44 U/L Final   Alkaline Phosphatase 05/01/2022 91  38 - 126 U/L Final   Total Bilirubin 05/01/2022 0.5  0.3 - 1.2 mg/dL Final   GFR, Estimated 05/01/2022 36 (L)  >60 mL/min Final   Comment: (NOTE) Calculated using the CKD-EPI Creatinine Equation (2021)    Anion gap 05/01/2022 14  5 - 15 Final   Performed at [facility], [place] [address], [place], [place] [address]   Alcohol, Ethyl (B) 05/01/2022 <10  <10 mg/dL Final   Comment: (NOTE) Lowest detectable limit for serum alcohol is 10 mg/dL.  For medical purposes only. Performed at [facility], [place] [address], [place], [place] [address]    Opiates 05/01/2022 NONE DETECTED  NONE DETECTED Final   Cocaine 05/01/2022 POSITIVE (A)  NONE DETECTED Final   Benzodiazepines 05/01/2022 POSITIVE (A)  NONE DETECTED Final   Amphetamines 05/01/2022 POSITIVE (A)  NONE DETECTED Final   Tetrahydrocannabinol  05/01/2022 NONE DETECTED  NONE DETECTED Final   Barbiturates 05/01/2022 NONE DETECTED  NONE DETECTED Final   Comment: (NOTE) DRUG SCREEN FOR MEDICAL PURPOSES ONLY.  IF CONFIRMATION IS NEEDED FOR ANY PURPOSE, NOTIFY LAB WITHIN 5 DAYS.  LOWEST DETECTABLE LIMITS FOR URINE DRUG SCREEN Drug Class                     Cutoff (ng/mL) Amphetamine and metabolites    1000 Barbiturate and metabolites    200 Benzodiazepine                 200 Opiates and metabolites        300 Cocaine and metabolites        300 THC                            50 Performed at [facility], [place] [address] [place], [place] [address]    WBC 05/01/2022 6.5  4.0 - 10.5 K/uL Final   RBC 05/01/2022 3.90 (L)  4.22 - 5.81 MIL/uL Final   Hemoglobin 05/01/2022 10.9 (L)  13.0 - 17.0 g/dL Final   HCT 05/01/2022 34.8 (L)  39.0 - 52.0 % Final   MCV 05/01/2022 89.2  80.0 - 100.0 fL Final   MCH 05/01/2022 27.9  26.0 - 34.0 pg Final   MCHC 05/01/2022 31.3  30.0 - 36.0 g/dL Final   RDW 05/01/2022 15.9 (H)  11.5 - 15.5 % Final   Platelets 05/01/2022 324  150 - 400 K/uL Final   nRBC 05/01/2022 0.0  0.0 - 0.2 % Final   Neutrophils Relative % 05/01/2022 67  % Final   Neutro Abs 05/01/2022 4.3  1.7 - 7.7 K/uL Final   Lymphocytes Relative 05/01/2022 22  % Final   Lymphs Abs 05/01/2022 1.4  0.7 - 4.0 K/uL Final   Monocytes Relative 05/01/2022 8  % Final   Monocytes Absolute 05/01/2022 0.5  0.1 - 1.0 K/uL Final   Eosinophils Relative 05/01/2022 2  % Final   Eosinophils Absolute 05/01/2022 0.1  0.0 - 0.5 K/uL Final   Basophils Relative 05/01/2022 1  % Final   Basophils Absolute 05/01/2022 0.1  0.0 - 0.1 K/uL Final   Immature Granulocytes 05/01/2022 0  % Final   Abs Immature Granulocytes 05/01/2022 0.02  0.00 - 0.07 K/uL Final   Performed at [facility], [place] [address] [place], [place] [address]   Glucose-Capillary 05/01/2022 167 (H)  70 - 99 mg/dL Final   Glucose reference range applies only to samples taken after  fasting for at least 8 hours.   Comment 1 05/01/2022 Notify RN   Final   Comment 2 05/01/2022 Document in Chart   Final   Glucose-Capillary 05/02/2022 127 (H)  70 - 99 mg/dL Final   Glucose reference range applies only to samples taken after fasting for at least 8 hours.   Comment 1 05/02/2022 Document in Chart   Final   Glucose-Capillary 05/02/2022 148 (H)  70 - 99 mg/dL Final   Glucose reference range applies only to samples taken after fasting for at least 8 hours.  Comment 1 05/02/2022 Notify RN   Final   Comment 2 05/02/2022 Document in Chart   Final   Glucose-Capillary 05/02/2022 108 (H)  70 - 99 mg/dL Final   Glucose reference range applies only to samples taken after fasting for at least 8 hours.   Glucose-Capillary 05/02/2022 158 (H)  70 - 99 mg/dL Final   Glucose reference range applies only to samples taken after fasting for at least 8 hours.   SARS Coronavirus 2 by RT PCR 05/02/2022 NEGATIVE  NEGATIVE Final   Performed at [facility], [place] [address], [place], [place] [address]  Admission on 04/13/2022, Discharged on 04/14/2022  Component Date Value Ref Range Status   Lactic Acid, Venous 04/13/2022 3.0 (HH)  0.5 - 1.9 mmol/L Final   Comment: CRITICAL RESULT CALLED TO, READ BACK BY AND VERIFIED WITH Ashley Akin RN 04/13/22 [id number] Wiliam Ke Performed at [facility], [place] [address], [place], [place] [address]    Lactic Acid, Venous 04/13/2022 2.0 (HH)  0.5 - 1.9 mmol/L Final   Comment: CRITICAL VALUE NOTED. VALUE IS CONSISTENT WITH PREVIOUSLY REPORTED/CALLED VALUE Performed at [facility], [place] [address] [place], [place] [address]    Sodium 04/13/2022 133 (L)  135 - 145 mmol/L Final   Potassium 04/13/2022 3.9  3.5 - 5.1 mmol/L Final   Chloride 04/13/2022 98  98 - 111 mmol/L Final   CO2 04/13/2022 22  22 - 32 mmol/L Final   Glucose, Bld 04/13/2022 170 (H)  70 - 99 mg/dL Final   Glucose reference range applies only to samples taken after fasting for at  least 8 hours.   BUN 04/13/2022 24 (H)  8 - 23 mg/dL Final   Creatinine, Ser 04/13/2022 1.77 (H)  0.61 - 1.24 mg/dL Final   Calcium 04/13/2022 9.4  8.9 - 10.3 mg/dL Final   Total Protein 04/13/2022 7.4  6.5 - 8.1 g/dL Final   Albumin 04/13/2022 3.4 (L)  3.5 - 5.0 g/dL Final   AST 04/13/2022 24  15 - 41 U/L Final   ALT 04/13/2022 10  0 - 44 U/L Final   Alkaline Phosphatase 04/13/2022 92  38 - 126 U/L Final   Total Bilirubin 04/13/2022 0.5  0.3 - 1.2 mg/dL Final   GFR, Estimated 04/13/2022 42 (L)  >60 mL/min Final   Comment: (NOTE) Calculated using the CKD-EPI Creatinine Equation (2021)    Anion gap 04/13/2022 13  5 - 15 Final   Performed at [facility] [address] [place], [place] [address]   WBC 04/13/2022 9.2  4.0 - 10.5 K/uL Final   RBC 04/13/2022 4.66  4.22 - 5.81 MIL/uL Final   Hemoglobin 04/13/2022 13.4  13.0 - 17.0 g/dL Final   HCT 04/13/2022 39.6  39.0 - 52.0 % Final   MCV 04/13/2022 85.0  80.0 - 100.0 fL Final   MCH 04/13/2022 28.8  26.0 - 34.0 pg Final   MCHC 04/13/2022 33.8  30.0 - 36.0 g/dL Final   RDW 04/13/2022 16.3 (H)  11.5 - 15.5 % Final   Platelets 04/13/2022 341  150 - 400 K/uL Final   REPEATED TO VERIFY   nRBC 04/13/2022 0.0  0.0 - 0.2 % Final   Neutrophils Relative % 04/13/2022 76  % Final   Neutro Abs 04/13/2022 7.0  1.7 - 7.7 K/uL Final   Lymphocytes Relative 04/13/2022 16  % Final   Lymphs Abs 04/13/2022 1.4  0.7 - 4.0 K/uL Final  Monocytes Relative 04/13/2022 7  % Final   Monocytes Absolute 04/13/2022 0.6  0.1 - 1.0 K/uL Final   Eosinophils Relative 04/13/2022 1  % Final   Eosinophils Absolute 04/13/2022 0.1  0.0 - 0.5 K/uL Final   Basophils Relative 04/13/2022 0  % Final   Basophils Absolute 04/13/2022 0.0  0.0 - 0.1 K/uL Final   Immature Granulocytes 04/13/2022 0  % Final   Abs Immature Granulocytes 04/13/2022 0.04  0.00 - 0.07 K/uL Final   Performed at [facility], [place] [address], [place], [place] [address]   Prothrombin Time  04/13/2022 13.5  11.4 - 15.2 seconds Final   INR 04/13/2022 1.0  0.8 - 1.2 Final   Comment: (NOTE) INR goal varies based on device and disease states. Performed at [facility], [place] [address], [place], [place] [address]    Specimen Description 04/13/2022 BLOOD RIGHT ARM   Final   Special Requests 04/13/2022 BOTTLES DRAWN AEROBIC AND ANAEROBIC Blood Culture results may not be optimal due to an inadequate volume of blood received in culture bottles   Final   Culture 04/13/2022    Final                   Value:NO GROWTH 5 DAYS Performed at [facility], [place] [address], [place], [place] [address]    Report Status 04/13/2022 04/18/2022 FINAL   Final   Specimen Description 04/13/2022 BLOOD LEFT HAND   Final   Special Requests 04/13/2022 BOTTLES DRAWN AEROBIC AND ANAEROBIC Blood Culture results may not be optimal due to an inadequate volume of blood received in culture bottles   Final   Culture 04/13/2022    Final                   Value:NO GROWTH 5 DAYS Performed at [facility], [place] [address] [place], [place] [address]    Report Status 04/13/2022 04/18/2022 FINAL   Final   SARS Coronavirus 2 by RT PCR 04/13/2022 NEGATIVE  NEGATIVE Final   Influenza A by PCR 04/13/2022 NEGATIVE  NEGATIVE Final   Influenza B by PCR 04/13/2022 NEGATIVE  NEGATIVE Final   Comment: (NOTE) The Xpert Xpress SARS-CoV-2/FLU/RSV plus assay is intended as an aid in the diagnosis of influenza from Nasopharyngeal swab specimens and should not be used as a sole basis for treatment. Nasal washings and aspirates are unacceptable for Xpert Xpress SARS-CoV-2/FLU/RSV testing.  Fact Sheet for Patients: EntrepreneurPulse.com.au  Fact Sheet for Healthcare Providers: IncredibleEmployment.be  This test is not yet approved or cleared by the [place] FDA and has been authorized for detection and/or diagnosis of SARS-CoV-2 by FDA under an Emergency Use Authorization  (EUA). This EUA will remain in effect (meaning this test can be used) for the duration of the COVID-19 declaration under Section 564(b)(1) of the Act, 21 U.S.C. section 360bbb-3(b)(1), unless the authorization is terminated or revoked.     Resp Syncytial Virus by PCR 04/13/2022 NEGATIVE  NEGATIVE Final   Comment: (NOTE) Fact Sheet for Patients: EntrepreneurPulse.com.au  Fact Sheet for Healthcare Providers: IncredibleEmployment.be  This test is not yet approved or cleared by the [place] FDA and has been authorized for detection and/or diagnosis of SARS-CoV-2 by FDA under an Emergency Use Authorization (EUA). This EUA will remain in effect (meaning this test can be used) for the duration of the COVID-19 declaration under Section 564(b)(1) of the Act, 21 U.S.C. section 360bbb-3(b)(1), unless the authorization is terminated or  revoked.  Performed at [facility], [place] [address], [place], [place] [address]    Troponin I (High Sensitivity) 04/13/2022 52 (H)  <18 ng/L Final   Comment: (NOTE) Elevated high sensitivity troponin I (hsTnI) values and significant  changes across serial measurements may suggest ACS but many other  chronic and acute conditions are known to elevate hsTnI results.  Refer to the "Links" section for chest pain algorithms and additional  guidance. Performed at [facility], [place] [address], [place], [place] [address]    B Natriuretic Peptide 04/13/2022 29.6  0.0 - 100.0 pg/mL Final   Performed at [facility] [address] [place], [place] [address]   Troponin I (High Sensitivity) 04/13/2022 58 (H)  <18 ng/L Final   Comment: (NOTE) Elevated high sensitivity troponin I (hsTnI) values and significant  changes across serial measurements may suggest ACS but many other  chronic and acute conditions are known to elevate hsTnI results.  Refer to the "Links" section for chest pain algorithms and additional   guidance. Performed at [facility], [place] [address], [place], [place] [address]    Color, Urine 04/13/2022 YELLOW  YELLOW Final   APPearance 04/13/2022 CLEAR  CLEAR Final   Specific Gravity, Urine 04/13/2022 1.020  1.005 - 1.030 Final   pH 04/13/2022 5.5  5.0 - 8.0 Final   Glucose, UA 04/13/2022 NEGATIVE  NEGATIVE mg/dL Final   Hgb urine dipstick 04/13/2022 NEGATIVE  NEGATIVE Final   Bilirubin Urine 04/13/2022 NEGATIVE  NEGATIVE Final   Ketones, ur 04/13/2022 NEGATIVE  NEGATIVE mg/dL Final   Protein, ur 04/13/2022 30 (A)  NEGATIVE mg/dL Final   Nitrite 04/13/2022 NEGATIVE  NEGATIVE Final   Leukocytes,Ua 04/13/2022 NEGATIVE  NEGATIVE Final   Performed at [facility], [place] [address] [place], [place] [address]   Specimen Description 04/13/2022 URINE, CLEAN CATCH   Final   Special Requests 04/13/2022 NONE   Final   Culture 04/13/2022    Final                   Value:NO GROWTH Performed at [facility], [place] [address], [place], [place] [address]    Report Status 04/13/2022 04/14/2022 FINAL   Final   Opiates 04/13/2022 NONE DETECTED  NONE DETECTED Final   Cocaine 04/13/2022 NONE DETECTED  NONE DETECTED Final   Benzodiazepines 04/13/2022 NONE DETECTED  NONE DETECTED Final   Amphetamines 04/13/2022 POSITIVE (A)  NONE DETECTED Final   Tetrahydrocannabinol 04/13/2022 NONE DETECTED  NONE DETECTED Final   Barbiturates 04/13/2022 NONE DETECTED  NONE DETECTED Final   Comment: (NOTE) DRUG SCREEN FOR MEDICAL PURPOSES ONLY.  IF CONFIRMATION IS NEEDED FOR ANY PURPOSE, NOTIFY LAB WITHIN 5 DAYS.  LOWEST DETECTABLE LIMITS FOR URINE DRUG SCREEN Drug Class                     Cutoff (ng/mL) Amphetamine and metabolites    1000 Barbiturate and metabolites    200 Benzodiazepine                 200 Opiates and metabolites        300 Cocaine and metabolites        300 THC                            50 Performed at [facility], [place] [address], [place], [place] [address]  Total CK 04/13/2022 177  49 - 397 U/L Final   Performed at [facility], [place] [address] [place], [place] [address]   Lactic Acid, Venous 04/13/2022 1.8  0.5 - 1.9 mmol/L Final   Performed at [facility] [address] [place], [place] [address]   RBC / HPF 04/13/2022 0-5  0 - 5 RBC/hpf Final   WBC, UA 04/13/2022 0-5  0 - 5 WBC/hpf Final   Bacteria, UA 04/13/2022 NONE SEEN  NONE SEEN Final   Squamous Epithelial / HPF 04/13/2022 0-5  0 - 5 /HPF Final   Mucus 04/13/2022 PRESENT   Final   Hyaline Casts, UA 04/13/2022 PRESENT   Final   Performed at [facility], [place] [address] [place] [address], [place], [place] [address]   Glucose-Capillary 04/13/2022 113 (H)  70 - 99 mg/dL Final   Glucose reference range applies only to samples taken after fasting for at least 8 hours.   Glucose-Capillary 04/14/2022 135 (H)  70 - 99 mg/dL Final   Glucose reference range applies only to samples taken after fasting for at least 8 hours.   Sodium 04/14/2022 138  135 - 145 mmol/L Final   Potassium 04/14/2022 3.8  3.5 - 5.1 mmol/L Final   Chloride 04/14/2022 101  98 - 111 mmol/L Final   CO2 04/14/2022 23  22 - 32 mmol/L Final   Glucose, Bld 04/14/2022 163 (H)  70 - 99 mg/dL Final   Glucose reference range applies only to samples taken after fasting for at least 8 hours.   BUN 04/14/2022 18  8 - 23 mg/dL Final   Creatinine, Ser 04/14/2022 1.12  0.61 - 1.24 mg/dL Final   Calcium 04/14/2022 8.3 (L)  8.9 - 10.3 mg/dL Final   GFR, Estimated 04/14/2022 >60  >60 mL/min Final   Comment: (NOTE) Calculated using the CKD-EPI Creatinine Equation (2021)    Anion gap 04/14/2022 14  5 - 15 Final   Performed at [facility], [place] [address] [place], [place] [address]   Glucose-Capillary 04/14/2022 187 (H)  70 - 99 mg/dL Final   Glucose reference range applies only to samples taken after fasting for at least 8 hours.  Admission on 04/05/2022, Discharged on 04/08/2022  Component Date Value Ref Range Status    Sodium 04/05/2022 136  135 - 145 mmol/L Final   Potassium 04/05/2022 4.0  3.5 - 5.1 mmol/L Final   Chloride 04/05/2022 100  98 - 111 mmol/L Final   CO2 04/05/2022 26  22 - 32 mmol/L Final   Glucose, Bld 04/05/2022 76  70 - 99 mg/dL Final   Glucose reference range applies only to samples taken after fasting for at least 8 hours.   BUN 04/05/2022 20  8 - 23 mg/dL Final   Creatinine, Ser 04/05/2022 1.31 (H)  0.61 - 1.24 mg/dL Final   Calcium 04/05/2022 8.6 (L)  8.9 - 10.3 mg/dL Final   Total Protein 04/05/2022 7.8  6.5 - 8.1 g/dL Final   Albumin 04/05/2022 3.6  3.5 - 5.0 g/dL Final   AST 04/05/2022 14 (L)  15 - 41 U/L Final   ALT 04/05/2022 8  0 - 44 U/L Final   Alkaline Phosphatase 04/05/2022 93  38 - 126 U/L Final   Total Bilirubin 04/05/2022 0.4  0.3 - 1.2 mg/dL Final   GFR, Estimated 04/05/2022 >60  >60 mL/min Final   Comment: (NOTE) Calculated using the CKD-EPI Creatinine Equation (2021)    Anion gap  04/05/2022 10  5 - 15 Final   Performed at [facility], [place] [address] [place], [place] [address]   Alcohol, Ethyl (B) 04/05/2022 <10  <10 mg/dL Final   Comment: (NOTE) Lowest detectable limit for serum alcohol is 10 mg/dL.  For medical purposes only. Performed at [facility], [place] [address] [place], [place] [address]    WBC 04/05/2022 6.7  4.0 - 10.5 K/uL Final   RBC 04/05/2022 3.96 (L)  4.22 - 5.81 MIL/uL Final   Hemoglobin 04/05/2022 10.9 (L)  13.0 - 17.0 g/dL Final   HCT 04/05/2022 35.2 (L)  39.0 - 52.0 % Final   MCV 04/05/2022 88.9  80.0 - 100.0 fL Final   MCH 04/05/2022 27.5  26.0 - 34.0 pg Final   MCHC 04/05/2022 31.0  30.0 - 36.0 g/dL Final   RDW 04/05/2022 17.0 (H)  11.5 - 15.5 % Final   Platelets 04/05/2022 312  150 - 400 K/uL Final   nRBC 04/05/2022 0.0  0.0 - 0.2 % Final   Neutrophils Relative % 04/05/2022 65  % Final   Neutro Abs 04/05/2022 4.4  1.7 - 7.7 K/uL Final   Lymphocytes Relative 04/05/2022 23  % Final   Lymphs  Abs 04/05/2022 1.5  0.7 - 4.0 K/uL Final   Monocytes Relative 04/05/2022 8  % Final   Monocytes Absolute 04/05/2022 0.6  0.1 - 1.0 K/uL Final   Eosinophils Relative 04/05/2022 3  % Final   Eosinophils Absolute 04/05/2022 0.2  0.0 - 0.5 K/uL Final   Basophils Relative 04/05/2022 1  % Final   Basophils Absolute 04/05/2022 0.1  0.0 - 0.1 K/uL Final   Immature Granulocytes 04/05/2022 0  % Final   Abs Immature Granulocytes 04/05/2022 0.01  0.00 - 0.07 K/uL Final   Performed at [facility], [place] [address] [place], [place] [address]   SARS Coronavirus 2 by RT PCR 04/05/2022 NEGATIVE  NEGATIVE Final   Comment: (NOTE) SARS-CoV-2 target nucleic acids are NOT DETECTED.  The SARS-CoV-2 RNA is generally detectable in upper respiratory specimens during the acute phase of infection. The lowest concentration of SARS-CoV-2 viral copies this assay can detect is 138 copies/mL. A negative result does not preclude SARS-Cov-2 infection and should not be used as the sole basis for treatment or other patient management decisions. A negative result may occur with  improper specimen collection/handling, submission of specimen other than nasopharyngeal swab, presence of viral mutation(s) within the areas targeted by this assay, and inadequate number of viral copies(<138 copies/mL). A negative result must be combined with clinical observations, patient history, and epidemiological information. The expected result is Negative.  Fact Sheet for Patients:  EntrepreneurPulse.com.au  Fact Sheet for Healthcare Providers:  IncredibleEmployment.be  This test is no                          t yet approved or cleared by the [place] FDA and  has been authorized for detection and/or diagnosis of SARS-CoV-2 by FDA under an Emergency Use Authorization (EUA). This EUA will remain  in effect (meaning this test can be used) for the duration of the COVID-19  declaration under Section 564(b)(1) of the Act, 21 U.S.C.section 360bbb-3(b)(1), unless the authorization is terminated  or revoked sooner.       Influenza A by PCR 04/05/2022 NEGATIVE  NEGATIVE Final   Influenza B by PCR 04/05/2022 NEGATIVE  NEGATIVE Final   Comment: (NOTE) The  Xpert Xpress SARS-CoV-2/FLU/RSV plus assay is intended as an aid in the diagnosis of influenza from Nasopharyngeal swab specimens and should not be used as a sole basis for treatment. Nasal washings and aspirates are unacceptable for Xpert Xpress SARS-CoV-2/FLU/RSV testing.  Fact Sheet for Patients: EntrepreneurPulse.com.au  Fact Sheet for Healthcare Providers: IncredibleEmployment.be  This test is not yet approved or cleared by the [place] FDA and has been authorized for detection and/or diagnosis of SARS-CoV-2 by FDA under an Emergency Use Authorization (EUA). This EUA will remain in effect (meaning this test can be used) for the duration of the COVID-19 declaration under Section 564(b)(1) of the Act, 21 U.S.C. section 360bbb-3(b)(1), unless the authorization is terminated or revoked.     Resp Syncytial Virus by PCR 04/05/2022 NEGATIVE  NEGATIVE Final   Comment: (NOTE) Fact Sheet for Patients: EntrepreneurPulse.com.au  Fact Sheet for Healthcare Providers: IncredibleEmployment.be  This test is not yet approved or cleared by the [place] FDA and has been authorized for detection and/or diagnosis of SARS-CoV-2 by FDA under an Emergency Use Authorization (EUA). This EUA will remain in effect (meaning this test can be used) for the duration of the COVID-19 declaration under Section 564(b)(1) of the Act, 21 U.S.C. section 360bbb-3(b)(1), unless the authorization is terminated or revoked.  Performed at [facility], [place] [address], [place], [place] [address]    SARS Coronavirus 2 by RT PCR 04/06/2022  NEGATIVE  NEGATIVE Final   Comment: (NOTE) SARS-CoV-2 target nucleic acids are NOT DETECTED.  The SARS-CoV-2 RNA is generally detectable in upper respiratory specimens during the acute phase of infection. The lowest concentration of SARS-CoV-2 viral copies this assay can detect is 138 copies/mL. A negative result does not preclude SARS-Cov-2 infection and should not be used as the sole basis for treatment or other patient management decisions. A negative result may occur with  improper specimen collection/handling, submission of specimen other than nasopharyngeal swab, presence of viral mutation(s) within the areas targeted by this assay, and inadequate number of viral copies(<138 copies/mL). A negative result must be combined with clinical observations, patient history, and epidemiological information. The expected result is Negative.  Fact Sheet for Patients:  EntrepreneurPulse.com.au  Fact Sheet for Healthcare Providers:  IncredibleEmployment.be  This test is no                          t yet approved or cleared by the [place] FDA and  has been authorized for detection and/or diagnosis of SARS-CoV-2 by FDA under an Emergency Use Authorization (EUA). This EUA will remain  in effect (meaning this test can be used) for the duration of the COVID-19 declaration under Section 564(b)(1) of the Act, 21 U.S.C.section 360bbb-3(b)(1), unless the authorization is terminated  or revoked sooner.       Influenza A by PCR 04/06/2022 NEGATIVE  NEGATIVE Final   Influenza B by PCR 04/06/2022 NEGATIVE  NEGATIVE Final   Comment: (NOTE) The Xpert Xpress SARS-CoV-2/FLU/RSV plus assay is intended as an aid in the diagnosis of influenza from Nasopharyngeal swab specimens and should not be used as a sole basis for treatment. Nasal washings and aspirates are unacceptable for Xpert Xpress SARS-CoV-2/FLU/RSV testing.  Fact Sheet for  Patients: EntrepreneurPulse.com.au  Fact Sheet for Healthcare Providers: IncredibleEmployment.be  This test is not yet approved or cleared by the [place] FDA and has been authorized for detection and/or diagnosis of SARS-CoV-2 by FDA under an Emergency Use Authorization (EUA). This  EUA will remain in effect (meaning this test can be used) for the duration of the COVID-19 declaration under Section 564(b)(1) of the Act, 21 U.S.C. section 360bbb-3(b)(1), unless the authorization is terminated or revoked.     Resp Syncytial Virus by PCR 04/06/2022 NEGATIVE  NEGATIVE Final   Comment: (NOTE) Fact Sheet for Patients: EntrepreneurPulse.com.au  Fact Sheet for Healthcare Providers: IncredibleEmployment.be  This test is not yet approved or cleared by the [place] FDA and has been authorized for detection and/or diagnosis of SARS-CoV-2 by FDA under an Emergency Use Authorization (EUA). This EUA will remain in effect (meaning this test can be used) for the duration of the COVID-19 declaration under Section 564(b)(1) of the Act, 21 U.S.C. section 360bbb-3(b)(1), unless the authorization is terminated or revoked.  Performed at [facility], [place] [address], [place], [place] [address]    Sodium 04/06/2022 133 (L)  135 - 145 mmol/L Final   Potassium 04/06/2022 4.9  3.5 - 5.1 mmol/L Final   Chloride 04/06/2022 98  98 - 111 mmol/L Final   CO2 04/06/2022 25  22 - 32 mmol/L Final   Glucose, Bld 04/06/2022 146 (H)  70 - 99 mg/dL Final   Glucose reference range applies only to samples taken after fasting for at least 8 hours.   BUN 04/06/2022 29 (H)  8 - 23 mg/dL Final   Creatinine, Ser 04/06/2022 2.12 (H)  0.61 - 1.24 mg/dL Final   Calcium 04/06/2022 8.6 (L)  8.9 - 10.3 mg/dL Final   Total Protein 04/06/2022 8.1  6.5 - 8.1 g/dL Final   Albumin 04/06/2022 3.3 (L)  3.5 - 5.0 g/dL Final   AST  04/06/2022 17  15 - 41 U/L Final   ALT 04/06/2022 10  0 - 44 U/L Final   Alkaline Phosphatase 04/06/2022 92  38 - 126 U/L Final   Total Bilirubin 04/06/2022 0.7  0.3 - 1.2 mg/dL Final   GFR, Estimated 04/06/2022 34 (L)  >60 mL/min Final   Comment: (NOTE) Calculated using the CKD-EPI Creatinine Equation (2021)    Anion gap 04/06/2022 10  5 - 15 Final   Performed at [facility], [place] [address] [place], [place] [address]   WBC 04/06/2022 11.8 (H)  4.0 - 10.5 K/uL Final   RBC 04/06/2022 4.13 (L)  4.22 - 5.81 MIL/uL Final   Hemoglobin 04/06/2022 11.6 (L)  13.0 - 17.0 g/dL Final   HCT 04/06/2022 37.4 (L)  39.0 - 52.0 % Final   MCV 04/06/2022 90.6  80.0 - 100.0 fL Final   MCH 04/06/2022 28.1  26.0 - 34.0 pg Final   MCHC 04/06/2022 31.0  30.0 - 36.0 g/dL Final   RDW 04/06/2022 17.0 (H)  11.5 - 15.5 % Final   Platelets 04/06/2022 309  150 - 400 K/uL Final   nRBC 04/06/2022 0.0  0.0 - 0.2 % Final   Neutrophils Relative % 04/06/2022 81  % Final   Neutro Abs 04/06/2022 9.5 (H)  1.7 - 7.7 K/uL Final   Lymphocytes Relative 04/06/2022 11  % Final   Lymphs Abs 04/06/2022 1.3  0.7 - 4.0 K/uL Final   Monocytes Relative 04/06/2022 7  % Final   Monocytes Absolute 04/06/2022 0.9  0.1 - 1.0 K/uL Final   Eosinophils Relative 04/06/2022 1  % Final   Eosinophils Absolute 04/06/2022 0.1  0.0 - 0.5 K/uL Final   Basophils Relative 04/06/2022 0  % Final   Basophils Absolute 04/06/2022 0.0  0.0 - 0.1  K/uL Final   Immature Granulocytes 04/06/2022 0  % Final   Abs Immature Granulocytes 04/06/2022 0.03  0.00 - 0.07 K/uL Final   Performed at [facility], [place] [address] [place], [place] [address]   B Natriuretic Peptide 04/06/2022 39.0  0.0 - 100.0 pg/mL Final   Performed at [place] [address], [place], [place] [address]   Troponin I (High Sensitivity) 04/06/2022 13  <18 ng/L Final   Comment: (NOTE) Elevated high sensitivity troponin I (hsTnI)  values and significant  changes across serial measurements may suggest ACS but many other  chronic and acute conditions are known to elevate hsTnI results.  Refer to the "Links" section for chest pain algorithms and additional  guidance. Performed at [facility], [place] [address], [place], [place] [address]    Specimen Description 04/07/2022    Final                   Value:BLOOD BLOOD RIGHT ARM Performed at [place] [address] [place], [place] [address]    Special Requests 04/07/2022    Final                   Value:BOTTLES DRAWN AEROBIC ONLY Blood Culture adequate volume Performed at [facility], [place] [address] [place], [place] [address]    Culture 04/07/2022    Final                   Value:NO GROWTH 5 DAYS Performed at [place] [address], [place], [place] [address]    Report Status 04/07/2022 04/12/2022 FINAL   Final   Specimen Description 04/07/2022    Final                   Value:BLOOD BLOOD LEFT ARM Performed at [facility], [place] [address] [place], [place] [address]    Special Requests 04/07/2022    Final                   Value:BOTTLES DRAWN AEROBIC ONLY Blood Culture adequate volume Performed at [facility], [place] [address] [place], [place] [address]    Culture 04/07/2022    Final                   Value:NO GROWTH 5 DAYS Performed at [place] [address] [place], [place] [address]    Report Status 04/07/2022 04/12/2022 FINAL   Final   Lactic Acid, Venous 04/07/2022 1.3  0.5 - 1.9 mmol/L Final   Performed at [place] [address] [place], [place] [address]   HIV Screen 4th Generation wRfx 04/07/2022 Non Reactive  Non Reactive Final   Performed at [facility], [place] [address] [place], [place] [address]   WBC 04/07/2022 8.4  4.0 - 10.5 K/uL Final   RBC 04/07/2022 3.69 (L)  4.22 - 5.81 MIL/uL Final    Hemoglobin 04/07/2022 10.3 (L)  13.0 - 17.0 g/dL Final   HCT 04/07/2022 34.3 (L)  39.0 - 52.0 % Final   MCV 04/07/2022 93.0  80.0 - 100.0 fL Final   MCH 04/07/2022 27.9  26.0 - 34.0 pg Final   MCHC 04/07/2022 30.0  30.0 - 36.0 g/dL Final   RDW 04/07/2022 17.2 (H)  11.5 - 15.5 % Final   Platelets 04/07/2022 279  150 - 400 K/uL Final  nRBC 04/07/2022 0.0  0.0 - 0.2 % Final   Performed at [place] [address], [place], [place] [address]   Creatinine, Ser 04/07/2022 1.80 (H)  0.61 - 1.24 mg/dL Final   GFR, Estimated 04/07/2022 42 (L)  >60 mL/min Final   Comment: (NOTE) Calculated using the CKD-EPI Creatinine Equation (2021) Performed at [facility], [place] [address] [place], [place] [address]    Sodium 04/07/2022 134 (L)  135 - 145 mmol/L Final   Potassium 04/07/2022 4.5  3.5 - 5.1 mmol/L Final   Chloride 04/07/2022 100  98 - 111 mmol/L Final   CO2 04/07/2022 25  22 - 32 mmol/L Final   Glucose, Bld 04/07/2022 159 (H)  70 - 99 mg/dL Final   Glucose reference range applies only to samples taken after fasting for at least 8 hours.   BUN 04/07/2022 31 (H)  8 - 23 mg/dL Final   Creatinine, Ser 04/07/2022 1.50 (H)  0.61 - 1.24 mg/dL Final   Calcium 04/07/2022 8.4 (L)  8.9 - 10.3 mg/dL Final   GFR, Estimated 04/07/2022 52 (L)  >60 mL/min Final   Comment: (NOTE) Calculated using the CKD-EPI Creatinine Equation (2021)    Anion gap 04/07/2022 9  5 - 15 Final   Performed at [facility], [place] [address] [place] [address], [place], [place] [address]   WBC 04/07/2022 6.8  4.0 - 10.5 K/uL Final   RBC 04/07/2022 3.57 (L)  4.22 - 5.81 MIL/uL Final   Hemoglobin 04/07/2022 9.9 (L)  13.0 - 17.0 g/dL Final   HCT 04/07/2022 32.3 (L)  39.0 - 52.0 % Final   MCV 04/07/2022 90.5  80.0 - 100.0 fL Final   MCH 04/07/2022 27.7  26.0 - 34.0 pg Final   MCHC 04/07/2022 30.7  30.0 - 36.0 g/dL Final   RDW 04/07/2022 16.9 (H)  11.5 - 15.5 % Final   Platelets 04/07/2022  264  150 - 400 K/uL Final   nRBC 04/07/2022 0.0  0.0 - 0.2 % Final   Neutrophils Relative % 04/07/2022 91  % Final   Neutro Abs 04/07/2022 6.2  1.7 - 7.7 K/uL Final   Lymphocytes Relative 04/07/2022 7  % Final   Lymphs Abs 04/07/2022 0.5 (L)  0.7 - 4.0 K/uL Final   Monocytes Relative 04/07/2022 2  % Final   Monocytes Absolute 04/07/2022 0.1  0.1 - 1.0 K/uL Final   Eosinophils Relative 04/07/2022 0  % Final   Eosinophils Absolute 04/07/2022 0.0  0.0 - 0.5 K/uL Final   Basophils Relative 04/07/2022 0  % Final   Basophils Absolute 04/07/2022 0.0  0.0 - 0.1 K/uL Final   Immature Granulocytes 04/07/2022 0  % Final   Abs Immature Granulocytes 04/07/2022 0.03  0.00 - 0.07 K/uL Final   Performed at [facility], [place] [address] [place], [place] [address]   Total CK 04/07/2022 164  49 - 397 U/L Final   Performed at [facility], [place] [address], [place], [place] [address]   Hgb A1c MFr Bld 04/07/2022 6.3 (H)  4.8 - 5.6 % Final   Comment: (NOTE) Pre diabetes:          5.7%-6.4%  Diabetes:              >6.4%  Glycemic control for   <7.0% adults with diabetes    Mean Plasma Glucose 04/07/2022 134.11  mg/dL Final   Performed at [facility], [place] [address]  [place], [place] [address]   Glucose-Capillary 04/07/2022 156 (H)  70 - 99 mg/dL Final   Glucose reference range applies only to samples taken after fasting for at least 8 hours.   Glucose-Capillary 04/07/2022 168 (H)  70 - 99 mg/dL Final   Glucose reference range applies only to samples taken after fasting for at least 8 hours.   Glucose-Capillary 04/07/2022 148 (H)  70 - 99 mg/dL Final   Glucose reference range applies only to samples taken after fasting for at least 8 hours.   WBC 04/08/2022 6.2  4.0 - 10.5 K/uL Final   RBC 04/08/2022 3.57 (L)  4.22 - 5.81 MIL/uL Final   Hemoglobin 04/08/2022 9.9 (L)  13.0 - 17.0 g/dL Final   HCT 04/08/2022 31.7 (L)  39.0 - 52.0 % Final   MCV 04/08/2022 88.8   80.0 - 100.0 fL Final   MCH 04/08/2022 27.7  26.0 - 34.0 pg Final   MCHC 04/08/2022 31.2  30.0 - 36.0 g/dL Final   RDW 04/08/2022 16.6 (H)  11.5 - 15.5 % Final   Platelets 04/08/2022 270  150 - 400 K/uL Final   nRBC 04/08/2022 0.0  0.0 - 0.2 % Final   Performed at [facility], [place] [address] [place], [place] [address]   Sodium 04/08/2022 134 (L)  135 - 145 mmol/L Final   Potassium 04/08/2022 4.0  3.5 - 5.1 mmol/L Final   Chloride 04/08/2022 98  98 - 111 mmol/L Final   CO2 04/08/2022 26  22 - 32 mmol/L Final   Glucose, Bld 04/08/2022 115 (H)  70 - 99 mg/dL Final   Glucose reference range applies only to samples taken after fasting for at least 8 hours.   BUN 04/08/2022 29 (H)  8 - 23 mg/dL Final   Creatinine, Ser 04/08/2022 1.06  0.61 - 1.24 mg/dL Final   Calcium 04/08/2022 8.3 (L)  8.9 - 10.3 mg/dL Final   GFR, Estimated 04/08/2022 >60  >60 mL/min Final   Comment: (NOTE) Calculated using the CKD-EPI Creatinine Equation (2021)    Anion gap 04/08/2022 10  5 - 15 Final   Performed at [facility], [place] [address], [place], [place] [address]   Glucose-Capillary 04/07/2022 183 (H)  70 - 99 mg/dL Final   Glucose reference range applies only to samples taken after fasting for at least 8 hours.   Comment 1 04/07/2022 Notify RN   Final   Comment 2 04/07/2022 Document in Chart   Final   Glucose-Capillary 04/08/2022 117 (H)  70 - 99 mg/dL Final   Glucose reference range applies only to samples taken after fasting for at least 8 hours.  Admission on 03/09/2022, Discharged on 03/09/2022  Component Date Value Ref Range Status   Sodium 03/09/2022 134 (L)  135 - 145 mmol/L Final   Potassium 03/09/2022 4.0  3.5 - 5.1 mmol/L Final   Chloride 03/09/2022 98  98 - 111 mmol/L Final   CO2 03/09/2022 27  22 - 32 mmol/L Final   Glucose, Bld 03/09/2022 151 (H)  70 - 99 mg/dL Final   Glucose reference range applies only to samples taken after fasting for at least 8  hours.   BUN 03/09/2022 13  8 - 23 mg/dL Final   Creatinine, Ser 03/09/2022 1.52 (H)  0.61 - 1.24 mg/dL Final   Calcium 03/09/2022 9.1  8.9 - 10.3 mg/dL Final   Total Protein 03/09/2022 7.3  6.5 - 8.1 g/dL Final   Albumin 03/09/2022 3.3 (  L)  3.5 - 5.0 g/dL Final   AST 03/09/2022 17  15 - 41 U/L Final   ALT 03/09/2022 11  0 - 44 U/L Final   Alkaline Phosphatase 03/09/2022 105  38 - 126 U/L Final   Total Bilirubin 03/09/2022 0.5  0.3 - 1.2 mg/dL Final   GFR, Estimated 03/09/2022 51 (L)  >60 mL/min Final   Comment: (NOTE) Calculated using the CKD-EPI Creatinine Equation (2021)    Anion gap 03/09/2022 9  5 - 15 Final   Performed at [facility], [place] [address], [place], [place] [address]   WBC 03/09/2022 8.2  4.0 - 10.5 K/uL Final   RBC 03/09/2022 4.26  4.22 - 5.81 MIL/uL Final   Hemoglobin 03/09/2022 11.5 (L)  13.0 - 17.0 g/dL Final   HCT 03/09/2022 37.0 (L)  39.0 - 52.0 % Final   MCV 03/09/2022 86.9  80.0 - 100.0 fL Final   MCH 03/09/2022 27.0  26.0 - 34.0 pg Final   MCHC 03/09/2022 31.1  30.0 - 36.0 g/dL Final   RDW 03/09/2022 17.1 (H)  11.5 - 15.5 % Final   Platelets 03/09/2022 350  150 - 400 K/uL Final   nRBC 03/09/2022 0.0  0.0 - 0.2 % Final   Performed at [facility], [place] [address], [place], [place] [address]   Glucose-Capillary 03/09/2022 137 (H)  70 - 99 mg/dL Final   Glucose reference range applies only to samples taken after fasting for at least 8 hours.  Admission on 01/10/2022, Discharged on 01/10/2022  Component Date Value Ref Range Status   Glucose-Capillary 01/10/2022 213 (H)  70 - 99 mg/dL Final   Glucose reference range applies only to samples taken after fasting for at least 8 hours.   Sodium 01/10/2022 133 (L)  135 - 145 mmol/L Final   Potassium 01/10/2022 4.0  3.5 - 5.1 mmol/L Final   Chloride 01/10/2022 102  98 - 111 mmol/L Final   CO2 01/10/2022 20 (L)  22 - 32 mmol/L Final   Glucose, Bld 01/10/2022 228 (H)  70 - 99 mg/dL Final   Glucose  reference range applies only to samples taken after fasting for at least 8 hours.   BUN 01/10/2022 20  8 - 23 mg/dL Final   Creatinine, Ser 01/10/2022 2.41 (H)  0.61 - 1.24 mg/dL Final   Calcium 01/10/2022 8.8 (L)  8.9 - 10.3 mg/dL Final   Total Protein 01/10/2022 7.1  6.5 - 8.1 g/dL Final   Albumin 01/10/2022 3.3 (L)  3.5 - 5.0 g/dL Final   AST 01/10/2022 20  15 - 41 U/L Final   ALT 01/10/2022 8  0 - 44 U/L Final   Alkaline Phosphatase 01/10/2022 79  38 - 126 U/L Final   Total Bilirubin 01/10/2022 0.3  0.3 - 1.2 mg/dL Final   GFR, Estimated 01/10/2022 29 (L)  >60 mL/min Final   Comment: (NOTE) Calculated using the CKD-EPI Creatinine Equation (2021)    Anion gap 01/10/2022 11  5 - 15 Final   Performed at [place] [address] [place], [place] [address]   WBC 01/10/2022 9.0  4.0 - 10.5 K/uL Final   RBC 01/10/2022 3.84 (L)  4.22 - 5.81 MIL/uL Final   Hemoglobin 01/10/2022 10.5 (L)  13.0 - 17.0 g/dL Final   HCT 01/10/2022 34.3 (L)  39.0 - 52.0 % Final   MCV 01/10/2022 89.3  80.0 - 100.0 fL Final   MCH 01/10/2022 27.3  26.0 -  34.0 pg Final   MCHC 01/10/2022 30.6  30.0 - 36.0 g/dL Final   RDW 01/10/2022 15.8 (H)  11.5 - 15.5 % Final   Platelets 01/10/2022 379  150 - 400 K/uL Final   nRBC 01/10/2022 0.0  0.0 - 0.2 % Final   Neutrophils Relative % 01/10/2022 78  % Final   Neutro Abs 01/10/2022 6.9  1.7 - 7.7 K/uL Final   Lymphocytes Relative 01/10/2022 15  % Final   Lymphs Abs 01/10/2022 1.3  0.7 - 4.0 K/uL Final   Monocytes Relative 01/10/2022 6  % Final   Monocytes Absolute 01/10/2022 0.6  0.1 - 1.0 K/uL Final   Eosinophils Relative 01/10/2022 1  % Final   Eosinophils Absolute 01/10/2022 0.1  0.0 - 0.5 K/uL Final   Basophils Relative 01/10/2022 0  % Final   Basophils Absolute 01/10/2022 0.0  0.0 - 0.1 K/uL Final   Immature Granulocytes 01/10/2022 0  % Final   Abs Immature Granulocytes 01/10/2022 0.02  0.00 - 0.07 K/uL Final   Performed at [facility], [place] [address] [place], [place] [address]   Lactic Acid, Venous 01/10/2022 3.7 (HH)  0.5 - 1.9 mmol/L Final   Comment: CRITICAL RESULT CALLED TO, READ BACK BY AND VERIFIED WITH C.ROE,RN. [id number] 01/10/22.PAIT,L Performed at [facility], [place] [address], [place], [place] [address]    Prothrombin Time 01/10/2022 15.3 (H)  11.4 - 15.2 seconds Final   INR 01/10/2022 1.2  0.8 - 1.2 Final   Comment: (NOTE) INR goal varies based on device and disease states. Performed at [facility], [place] [address], [place], [place] [address]    Specimen Description 01/10/2022 BLOOD RIGHT ANTECUBITAL   Final   Special Requests 01/10/2022 BOTTLES DRAWN AEROBIC AND ANAEROBIC Blood Culture adequate volume   Final   Culture 01/10/2022    Final                   Value:NO GROWTH 5 DAYS Performed at [facility], [place] [address], [place], [place] [address]    Report Status 01/10/2022 01/15/2022 FINAL   Final   Specimen Description 01/10/2022 BLOOD SITE NOT SPECIFIED   Final   Special Requests 01/10/2022 BOTTLES DRAWN AEROBIC AND ANAEROBIC Blood Culture results may not be optimal due to an excessive volume of blood received in culture bottles   Final   Culture 01/10/2022    Final                   Value:NO GROWTH 5 DAYS Performed at [facility], [place] [address], [place], [place] [address]    Report Status 01/10/2022 01/15/2022 FINAL   Final   Lactic Acid, Venous 01/10/2022 2.4 (HH)  0.5 - 1.9 mmol/L Final   Comment: CRITICAL VALUE NOTED. VALUE IS CONSISTENT WITH PREVIOUSLY REPORTED/CALLED VALUE Performed at [facility], [place] [address] [place], [place] [address]     Blood Alcohol level:  Lab Results  Component Value Date   [facility] <10 05/01/2022   ETH <10 [id number]    Metabolic Disorder Labs: Lab Results  Component Value Date   HGBA1C 6.3 (H) 04/07/2022   MPG 134.11 04/07/2022   MPG 277.61 03/02/2020   Lab Results  Component Value Date   PROLACTIN 21.0 (H) 05/26/2015   Lab Results   Component Value Date   CHOL 143 05/03/2022   TRIG 221 (H) 05/03/2022   HDL 35 (L) 05/03/2022   CHOLHDL  4.1 05/03/2022   VLDL 44 (H) 05/03/2022   LDLCALC 64 05/03/2022   LDLCALC 81 03/06/2020    Therapeutic Lab Levels: No results found for: "LITHIUM" No results found for: "VALPROATE" Lab Results  Component Value Date   CBMZ 8.9 05/28/2015    Physical Findings   AIMS    Flowsheet Row Admission (Discharged) from 05/23/2015 in [facility]  AIMS Total Score 0      AUDIT    Flowsheet Row Admission (Discharged) from 05/23/2015 in [facility]  Alcohol Use Disorder Identification Test Final Score (AUDIT) 0      PHQ2-9    Flowsheet Row ED from 05/03/2022 in [facility] ED from 05/01/2022 in [facility] Emergency Department at [facility]  PHQ-2 Total Score 5 5  PHQ-9 Total Score 21 19      Flowsheet Row ED from 05/03/2022 in [facility] ED from 05/01/2022 in [facility] Emergency Department at [facility] ED to Hosp-Admission (Discharged) from 04/13/2022 in [place] No Risk Error: Q3, 4, or 5 should not be populated when Q2 is No No Risk        Musculoskeletal  Strength & Muscle Tone: decreased Gait & Station: unsteady Patient leans:  forward on walker  Psychiatric Specialty Exam  Presentation  General Appearance:  Disheveled  Eye Contact: Good  Speech: Clear and Coherent  Speech Volume: Normal  Handedness: Right   Mood and Affect  Mood: Dysphoric  Affect: Appropriate; Congruent   Thought Process  Thought Processes: Coherent  Descriptions of Associations:Intact  Orientation:Full (Time, Place and Person)  Thought Content:Delusions (does believe there is a man after him for no known reason)  Diagnosis of Schizophrenia or Schizoaffective disorder in past: No   Duration of Psychotic Symptoms: Greater than six months   Hallucinations:Hallucinations: None Description of Auditory Hallucinations: Hearing voices of some people he recognizes and some he does not  Ideas of Reference:Paranoia  Suicidal Thoughts:Suicidal Thoughts: No SI Passive Intent and/or Plan: Without Intent; Without Plan  Homicidal Thoughts:Homicidal Thoughts: No   Sensorium  Memory: Immediate Good; Recent Fair  Judgment: Impaired  Insight: Shallow   Executive Functions  Concentration: Fair  Attention Span: Fair  Recall: [place] of Knowledge: Fair  Language: Fair   Psychomotor Activity  Psychomotor Activity: Psychomotor Activity: Psychomotor Retardation   Assets  Assets: Communication Skills; Resilience; Desire for Improvement; Housing; Social Support   Sleep  Sleep: Sleep: Good Number of Hours of Sleep: -1   Nutritional Assessment (For OBS and [facility]BC admissions only) Has the patient had a weight loss or gain of 10 pounds or more in the last 3 months?: No Has the patient had a decrease in food intake/or appetite?: No Does the patient have dental problems?: No Does the patient have eating habits or behaviors that may be indicators of an eating disorder including binging or inducing vomiting?: No Has the patient recently lost weight without trying?: 0 Has the patient been eating poorly because of a decreased appetite?: 0 Malnutrition Screening Tool Score: 0    Physical Exam  Physical Exam HENT:     Head: Normocephalic and atraumatic.  Pulmonary:     Effort: Pulmonary effort is normal.  Musculoskeletal:     Comments: 4/5 in BUE 4/5 in BLE  Neurological:     Mental Status: He is alert and oriented to person, place, and  time.    Review of Systems  Psychiatric/Behavioral:  Positive for depression. Negative for hallucinations and suicidal ideas. The patient is nervous/anxious.    Blood pressure 106/63, pulse 99, temperature 98.5 F  (36.9 C), temperature source Oral, resp. rate 18, SpO2 98 %. There is no height or weight on file to calculate BMI.  Treatment Plan Summary: Daily contact with patient to assess and evaluate symptoms and progress in treatment and Medication management   Patient appears to be endorsing SIPD, he denies having AH before meth use and his paranoia also started during meth use. He is now on his longest period off meth and is not having the AVH that was becoming so frequent. Would like to reach out to [facility] to find out about potential LAI or other concerns. Will continue known meds for now and monitor.  Repeat EKG- pending   Substance induced psychosis Stimulant use disorder, severe (meth) Cannabis use disorder - Reach out to [facility]  Hx of Opioid use d/o Patient has been in remission, his BP is also low normotensive. - D/c Clonidine taper  MDD ( vs Bipolar d/o) - Continue home Zoloft '75mg'$  daily (if NA continues to fall, will dc) - Continue Trazodone '50mg'$  QHS  Chronic leg pain - Gabapentin '1200mg'$  TID  Hyponatremia Patient albumin suggest possible malnutrition, will continue to monitor. Patient Na has dropped 3 points in 2 days. Patient was very dehydrated when he presented to [facility], may need time to balance out hydration given in ED.    Medical - Continue home meds    PGY-3 Freida Busman, MD 05/03/2022 3:02 PM

## 2022-05-04 ENCOUNTER — Encounter (HOSPITAL_COMMUNITY): Payer: Self-pay | Admitting: Nurse Practitioner

## 2022-05-04 DIAGNOSIS — F1410 Cocaine abuse, uncomplicated: Secondary | ICD-10-CM | POA: Diagnosis not present

## 2022-05-04 DIAGNOSIS — F1594 Other stimulant use, unspecified with stimulant-induced mood disorder: Secondary | ICD-10-CM | POA: Diagnosis not present

## 2022-05-04 LAB — GLUCOSE, CAPILLARY
Glucose-Capillary: 124 mg/dL — ABNORMAL HIGH (ref 70–99)
Glucose-Capillary: 107 mg/dL — ABNORMAL HIGH (ref 70–99)

## 2022-05-04 NOTE — ED Notes (Signed)
Pt is alert and oriented. He denies SI, HI or AVH. Pt is complaining of neuropathy in his lower extremities bilat and was given medication as ordered.  Pt cont to use a walker to ambulate and has slip proof socks on.  Pt has been encouraged to drink no sugar Gatorade

## 2022-05-04 NOTE — ED Notes (Signed)
Pt is sitting up in dayroom watching TV with peers no distress noted.

## 2022-05-04 NOTE — ED Notes (Signed)
Pt sleeping in bed. RR even unlabored. No noted distress. Will continue to  monitor for safety

## 2022-05-04 NOTE — ED Notes (Signed)
Pt a/o, cooperative and pleasant. Denies SI/ Hi/ AVH. Denies s/s of withdrawal.  Pt voices c/o constipation. No noted distress. Will continue to monitor for safety

## 2022-05-04 NOTE — ED Notes (Signed)
Patients blood sugar was 107

## 2022-05-04 NOTE — ED Notes (Signed)
Pt was told there was dinner in the dining room, he went in to the dining room, and did not pick up his dinner from the table. Pt then stated no one tried to give him dinner.

## 2022-05-04 NOTE — ED Notes (Signed)
Pt in room, sleeping. RR even and unlabored. No noted distress. Will continue to monitor for safety

## 2022-05-04 NOTE — ED Notes (Signed)
Pt sleeping in bed. RR even and unlabored. No noted distress. Will continue to monitor for safety

## 2022-05-04 NOTE — ED Provider Notes (Signed)
Behavioral Health Progress Note  Date and Time: 05/04/2022 2:08 PM Name: Aristotelis Giovanelli MRN:  [id number]  Subjective:  Genard Rasmuson is a 67 y/o male with a history of methamphetamine use disorder, major depressive disorder, generalized anxiety disorder, opioid use disorder, auditory hallucinations, suicidal ideation and bipolar 1 disorder who initially presented to [facility] ED voluntarily by [facility] due to auditory hallucinations and methamphetamine use.  Patient was evaluated by Britt Bolognese Adegbola-NP and recommended for treatment at [facility] and patient was transferred to [facility] ED to [facility] for detox treatment.    Patient has an ACTT- PSI ([place].) Phone [phone]   On assessment today patient reports that the [facility] continues but is decreasing in intensity. He also reports that the voices have him convinced that someone in the facility it out to hurt him, but he reports that he does not believe the voices for the first time in a long time. Patient reports that he is not afraid of anyone in the facility and actually feels safe despite what the voices say. Patient reports that he is only staying in his room because he is more comfortable lying in the bed, but he will come out for meals today. Patient reports that he is a very picky eater, but will try to eat some foods other than peanut butter and crackers today. Patient denies SI, HI, and VH.  He endorses that he sleeps ok. Patient reports that he has night time awakenings, but attributes this to how many naps he takes during the day. Patient reports that his mood is " a little better." Patient reports that thus far the experience has showed him that he should not be doing meth. Patient reports that he has no intention of relapsing.   Diagnosis:  Final diagnoses:  Methamphetamine abuse ([place])  Severe episode of recurrent major depressive disorder, with psychotic features ([place])    Total Time spent with patient: 20  minutes Past Psychiatric History:  Cannabis abuse, opioid use dx INPT: 4x, reports all for Opioid abuse, endorses that he has never had a SA before Past Medical History: AAA, PR, HFrEF, PE, NSTEMI Family History: Unknown Family Psychiatric  History: Unknown Social History:  -lives alone - ACTT visit weekly  Additional Social History:                         Sleep: Fair  Appetite:   Improving  Current Medications:  Current Facility-Administered Medications  Medication Dose Route Frequency Provider Last Rate Last Admin   acetaminophen (TYLENOL) tablet 650 mg  650 mg Oral Q6H PRN Bobbitt, Shalon E, NP       albuterol (VENTOLIN HFA) 108 (90 Base) MCG/ACT inhaler 2 puff  2 puff Inhalation Q4H PRN Bobbitt, Shalon E, NP       alum & mag hydroxide-simeth (MAALOX/MYLANTA) 200-200-20 MG/5ML suspension 30 mL  30 mL Oral Q4H PRN Bobbitt, Shalon E, NP       apixaban (ELIQUIS) tablet 5 mg  5 mg Oral BID Bobbitt, Shalon E, NP   5 mg at 05/04/22 0945   carvedilol (COREG) tablet 6.25 mg  6.25 mg Oral BID WC Bobbitt, Shalon E, NP   6.25 mg at 05/04/22 0846   clopidogrel (PLAVIX) tablet 75 mg  75 mg Oral Daily Bobbitt, Shalon E, NP   75 mg at 05/04/22 0945   dicyclomine (BENTYL) tablet 20 mg  20 mg Oral Q6H PRN Bobbitt, Lennie Muckle, NP  famotidine (PEPCID) tablet 20 mg  20 mg Oral Daily PRN Bobbitt, Shalon E, NP       gabapentin (NEURONTIN) capsule 1,200 mg  1,200 mg Oral TID Bobbitt, Shalon E, NP   1,200 mg at 05/04/22 0945   glipiZIDE (GLUCOTROL XL) 24 hr tablet 10 mg  10 mg Oral Daily Bobbitt, Shalon E, NP   10 mg at 05/04/22 0945   hydrOXYzine (ATARAX) tablet 25 mg  25 mg Oral Q6H PRN Bobbitt, Shalon E, NP       loperamide (IMODIUM) capsule 2-4 mg  2-4 mg Oral PRN Bobbitt, Shalon E, NP       magnesium hydroxide (MILK OF MAGNESIA) suspension 30 mL  30 mL Oral Daily PRN Bobbitt, Shalon E, NP       metFORMIN (GLUCOPHAGE) tablet 1,000 mg  1,000 mg Oral BID WC Bobbitt, Shalon E, NP   1,000  mg at 05/04/22 0845   methocarbamol (ROBAXIN) tablet 500 mg  500 mg Oral Q8H PRN Bobbitt, Shalon E, NP   500 mg at 05/03/22 2106   naproxen (NAPROSYN) tablet 500 mg  500 mg Oral BID PRN Bobbitt, Shalon E, NP       nicotine (NICODERM CQ - dosed in mg/24 hours) patch 21 mg  21 mg Transdermal Daily Bobbitt, Shalon E, NP   21 mg at 05/04/22 0948   ondansetron (ZOFRAN-ODT) disintegrating tablet 4 mg  4 mg Oral Q6H PRN Bobbitt, Shalon E, NP       rosuvastatin (CRESTOR) tablet 20 mg  20 mg Oral QHS Bobbitt, Shalon E, NP   20 mg at 05/03/22 2106   senna-docusate (Senokot-S) tablet 2 tablet  2 tablet Oral Daily PRN Bobbitt, Shalon E, NP       sertraline (ZOLOFT) tablet 75 mg  75 mg Oral Daily Bobbitt, Shalon E, NP   75 mg at 05/04/22 0945   traMADol (ULTRAM) tablet 50 mg  50 mg Oral Q6H PRN Bobbitt, Shalon E, NP   50 mg at 05/03/22 2107   traZODone (DESYREL) tablet 50 mg  50 mg Oral QHS Bobbitt, Shalon E, NP   50 mg at 05/03/22 2106   Current Outpatient Medications  Medication Sig Dispense Refill   apixaban (ELIQUIS) 5 MG TABS tablet Take 1 tablet (5 mg total) by mouth 2 (two) times daily.     carvedilol (COREG) 6.25 MG tablet TAKE 1 TABLET (6.25 MG TOTAL) BY MOUTH TWO TIMES DAILY WITH A MEAL. (Patient taking differently: Take 6.25 mg by mouth 2 (two) times daily with a meal.) 60 tablet 0   clopidogrel (PLAVIX) 75 MG tablet Take 75 mg by mouth daily.     Dulaglutide (TRULICITY) 1.5 0000000 SOPN Inject 1.5 mg into the skin once a week. Friday     gabapentin (NEURONTIN) 600 MG tablet Take 1,200 mg by mouth 3 (three) times daily.     glipiZIDE (GLUCOTROL XL) 10 MG 24 hr tablet TAKE 1 TABLET (10 MG TOTAL) BY MOUTH DAILY. (Patient taking differently: Take 10 mg by mouth daily.) 30 tablet 0   metFORMIN (GLUCOPHAGE) 1000 MG tablet Take 1,000 mg by mouth 2 (two) times daily with a meal.     polyethylene glycol (MIRALAX / GLYCOLAX) 17 g packet Take 17 g by mouth daily as needed for mild constipation.      rosuvastatin (CRESTOR) 20 MG tablet Take 20 mg by mouth at bedtime.     sacubitril-valsartan (ENTRESTO) 24-26 MG Take 1 tablet by mouth 2 (two) times daily.  senna-docusate (SENOKOT-S) 8.6-50 MG tablet Take 2 tablets by mouth daily as needed for mild constipation.     sertraline (ZOLOFT) 50 MG tablet Take 75 mg by mouth daily. Taking 1 & 1/2 tabs daily = 75 mg 7 DS delivered on 04-01-22     thiamine (VITAMIN B-1) 100 MG tablet Take 100 mg by mouth daily.     tiZANidine (ZANAFLEX) 4 MG capsule Take 4 mg by mouth 2 (two) times daily as needed for muscle spasms.     traMADol (ULTRAM) 50 MG tablet Take 50 mg by mouth every 6 (six) hours as needed for moderate pain.     traZODone (DESYREL) 50 MG tablet Take 50 mg by mouth at bedtime.     blood glucose meter kit and supplies KIT Dispense based on patient and insurance preference. Use up to four times daily as directed. (FOR ICD-9 250.00, 250.01). 1 each 0    Labs  Lab Results:  Admission on 05/03/2022  Component Date Value Ref Range Status   Sodium 05/03/2022 132 (L)  135 - 145 mmol/L Final   Potassium 05/03/2022 4.1  3.5 - 5.1 mmol/L Final   Chloride 05/03/2022 96 (L)  98 - 111 mmol/L Final   CO2 05/03/2022 28  22 - 32 mmol/L Final   Glucose, Bld 05/03/2022 148 (H)  70 - 99 mg/dL Final   Glucose reference range applies only to samples taken after fasting for at least 8 hours.   BUN 05/03/2022 20  8 - 23 mg/dL Final   Creatinine, Ser 05/03/2022 1.11  0.61 - 1.24 mg/dL Final   Calcium 05/03/2022 8.4 (L)  8.9 - 10.3 mg/dL Final   Total Protein 05/03/2022 6.2 (L)  6.5 - 8.1 g/dL Final   Albumin 05/03/2022 2.8 (L)  3.5 - 5.0 g/dL Final   AST 05/03/2022 14 (L)  15 - 41 U/L Final   ALT 05/03/2022 11  0 - 44 U/L Final   Alkaline Phosphatase 05/03/2022 88  38 - 126 U/L Final   Total Bilirubin 05/03/2022 0.4  0.3 - 1.2 mg/dL Final   GFR, Estimated 05/03/2022 >60  >60 mL/min Final   Comment: (NOTE) Calculated using the CKD-EPI Creatinine Equation  (2021)    Anion gap 05/03/2022 8  5 - 15 Final   Performed at [facility] [address] [place],  [address]   Cholesterol 05/03/2022 143  0 - 200 mg/dL Final   Triglycerides 05/03/2022 221 (H)  <150 mg/dL Final   HDL 05/03/2022 35 (L)  >40 mg/dL Final   Total CHOL/HDL Ratio 05/03/2022 4.1  RATIO Final   VLDL 05/03/2022 44 (H)  0 - 40 mg/dL Final   LDL Cholesterol 05/03/2022 64  0 - 99 mg/dL Final   Comment:        Total Cholesterol/HDL:CHD Risk Coronary Heart Disease Risk Table                     Men   Women  1/2 Average Risk   3.4   3.3  Average Risk       5.0   4.4  2 X Average Risk   9.6   7.1  3 X Average Risk  23.4   11.0        Use the calculated Patient Ratio above and the CHD Risk Table to determine the patient's CHD Risk.        ATP III CLASSIFICATION (LDL):  <100     mg/dL  Optimal  100-129  mg/dL   Near or Above                    Optimal  130-159  mg/dL   Borderline  160-189  mg/dL   High  >190     mg/dL   Very High Performed at [facility] [address] [place], [place] [address]    TSH 05/03/2022 1.582  0.350 - 4.500 uIU/mL Final   Comment: Performed by a 3rd Generation assay with a functional sensitivity of <=0.01 uIU/mL. Performed at [facility], [place] [address] [place], [place] [address]    Glucose-Capillary 05/03/2022 99  70 - 99 mg/dL Final   Glucose reference range applies only to samples taken after fasting for at least 8 hours.   Glucose-Capillary 05/04/2022 124 (H)  70 - 99 mg/dL Final   Glucose reference range applies only to samples taken after fasting for at least 8 hours.  Admission on 05/01/2022, Discharged on 05/03/2022  Component Date Value Ref Range Status   Sodium 05/01/2022 135  135 - 145 mmol/L Final   Potassium 05/01/2022 4.6  3.5 - 5.1 mmol/L Final   Chloride 05/01/2022 100  98 - 111 mmol/L Final   CO2 05/01/2022 21 (L)  22 - 32 mmol/L Final   Glucose, Bld 05/01/2022 188 (H)  70 - 99 mg/dL Final    Glucose reference range applies only to samples taken after fasting for at least 8 hours.   BUN 05/01/2022 29 (H)  8 - 23 mg/dL Final   Creatinine, Ser 05/01/2022 2.04 (H)  0.61 - 1.24 mg/dL Final   Calcium 05/01/2022 8.9  8.9 - 10.3 mg/dL Final   Total Protein 05/01/2022 6.5  6.5 - 8.1 g/dL Final   Albumin 05/01/2022 2.9 (L)  3.5 - 5.0 g/dL Final   AST 05/01/2022 17  15 - 41 U/L Final   ALT 05/01/2022 13  0 - 44 U/L Final   Alkaline Phosphatase 05/01/2022 91  38 - 126 U/L Final   Total Bilirubin 05/01/2022 0.5  0.3 - 1.2 mg/dL Final   GFR, Estimated 05/01/2022 36 (L)  >60 mL/min Final   Comment: (NOTE) Calculated using the CKD-EPI Creatinine Equation (2021)    Anion gap 05/01/2022 14  5 - 15 Final   Performed at [facility], [place] [address] [place], [place] [address]   Alcohol, Ethyl (B) 05/01/2022 <10  <10 mg/dL Final   Comment: (NOTE) Lowest detectable limit for serum alcohol is 10 mg/dL.  For medical purposes only. Performed at [facility], [place] [address] [place],  [address]    Opiates 05/01/2022 NONE DETECTED  NONE DETECTED Final   Cocaine 05/01/2022 POSITIVE (A)  NONE DETECTED Final   Benzodiazepines 05/01/2022 POSITIVE (A)  NONE DETECTED Final   Amphetamines 05/01/2022 POSITIVE (A)  NONE DETECTED Final   Tetrahydrocannabinol 05/01/2022 NONE DETECTED  NONE DETECTED Final   Barbiturates 05/01/2022 NONE DETECTED  NONE DETECTED Final   Comment: (NOTE) DRUG SCREEN FOR MEDICAL PURPOSES ONLY.  IF CONFIRMATION IS NEEDED FOR ANY PURPOSE, NOTIFY LAB WITHIN 5 DAYS.  LOWEST DETECTABLE LIMITS FOR URINE DRUG SCREEN Drug Class                     Cutoff (ng/mL) Amphetamine and metabolites    1000 Barbiturate and metabolites    200 Benzodiazepine  200 Opiates and metabolites        300 Cocaine and metabolites        300 THC                            50 Performed at [facility], [place] [address] [place], [place] [address]    WBC  05/01/2022 6.5  4.0 - 10.5 K/uL Final   RBC 05/01/2022 3.90 (L)  4.22 - 5.81 MIL/uL Final   Hemoglobin 05/01/2022 10.9 (L)  13.0 - 17.0 g/dL Final   HCT 05/01/2022 34.8 (L)  39.0 - 52.0 % Final   MCV 05/01/2022 89.2  80.0 - 100.0 fL Final   MCH 05/01/2022 27.9  26.0 - 34.0 pg Final   MCHC 05/01/2022 31.3  30.0 - 36.0 g/dL Final   RDW 05/01/2022 15.9 (H)  11.5 - 15.5 % Final   Platelets 05/01/2022 324  150 - 400 K/uL Final   nRBC 05/01/2022 0.0  0.0 - 0.2 % Final   Neutrophils Relative % 05/01/2022 67  % Final   Neutro Abs 05/01/2022 4.3  1.7 - 7.7 K/uL Final   Lymphocytes Relative 05/01/2022 22  % Final   Lymphs Abs 05/01/2022 1.4  0.7 - 4.0 K/uL Final   Monocytes Relative 05/01/2022 8  % Final   Monocytes Absolute 05/01/2022 0.5  0.1 - 1.0 K/uL Final   Eosinophils Relative 05/01/2022 2  % Final   Eosinophils Absolute 05/01/2022 0.1  0.0 - 0.5 K/uL Final   Basophils Relative 05/01/2022 1  % Final   Basophils Absolute 05/01/2022 0.1  0.0 - 0.1 K/uL Final   Immature Granulocytes 05/01/2022 0  % Final   Abs Immature Granulocytes 05/01/2022 0.02  0.00 - 0.07 K/uL Final   Performed at [facility], [place] [address], [place], [place] [address]   Glucose-Capillary 05/01/2022 167 (H)  70 - 99 mg/dL Final   Glucose reference range applies only to samples taken after fasting for at least 8 hours.   Comment 1 05/01/2022 Notify RN   Final   Comment 2 05/01/2022 Document in Chart   Final   Glucose-Capillary 05/02/2022 127 (H)  70 - 99 mg/dL Final   Glucose reference range applies only to samples taken after fasting for at least 8 hours.   Comment 1 05/02/2022 Document in Chart   Final   Glucose-Capillary 05/02/2022 148 (H)  70 - 99 mg/dL Final   Glucose reference range applies only to samples taken after fasting for at least 8 hours.   Comment 1 05/02/2022 Notify RN   Final   Comment 2 05/02/2022 Document in Chart   Final   Glucose-Capillary 05/02/2022 108 (H)  70 - 99 mg/dL Final   Glucose  reference range applies only to samples taken after fasting for at least 8 hours.   Glucose-Capillary 05/02/2022 158 (H)  70 - 99 mg/dL Final   Glucose reference range applies only to samples taken after fasting for at least 8 hours.   SARS Coronavirus 2 by RT PCR 05/02/2022 NEGATIVE  NEGATIVE Final   Performed at [facility], [place] [address], [place], [place] [address]  Admission on 04/13/2022, Discharged on 04/14/2022  Component Date Value Ref Range Status   Lactic Acid, Venous 04/13/2022 3.0 (HH)  0.5 - 1.9 mmol/L Final   Comment: CRITICAL RESULT CALLED TO, READ BACK BY AND VERIFIED WITH Ashley Akin RN 04/13/22 [place] Performed  at [facility], [place] [address] [place], [place] [address]    Lactic Acid, Venous 04/13/2022 2.0 (HH)  0.5 - 1.9 mmol/L Final   Comment: CRITICAL VALUE NOTED. VALUE IS CONSISTENT WITH PREVIOUSLY REPORTED/CALLED VALUE Performed at [facility], [place] [address] [place], [place] [address]    Sodium 04/13/2022 133 (L)  135 - 145 mmol/L Final   Potassium 04/13/2022 3.9  3.5 - 5.1 mmol/L Final   Chloride 04/13/2022 98  98 - 111 mmol/L Final   CO2 04/13/2022 22  22 - 32 mmol/L Final   Glucose, Bld 04/13/2022 170 (H)  70 - 99 mg/dL Final   Glucose reference range applies only to samples taken after fasting for at least 8 hours.   BUN 04/13/2022 24 (H)  8 - 23 mg/dL Final   Creatinine, Ser 04/13/2022 1.77 (H)  0.61 - 1.24 mg/dL Final   Calcium 04/13/2022 9.4  8.9 - 10.3 mg/dL Final   Total Protein 04/13/2022 7.4  6.5 - 8.1 g/dL Final   Albumin 04/13/2022 3.4 (L)  3.5 - 5.0 g/dL Final   AST 04/13/2022 24  15 - 41 U/L Final   ALT 04/13/2022 10  0 - 44 U/L Final   Alkaline Phosphatase 04/13/2022 92  38 - 126 U/L Final   Total Bilirubin 04/13/2022 0.5  0.3 - 1.2 mg/dL Final   GFR, Estimated 04/13/2022 42 (L)  >60 mL/min Final   Comment: (NOTE) Calculated using the CKD-EPI Creatinine Equation (2021)    Anion gap 04/13/2022 13  5 - 15  Final   Performed at [facility] [address] [place], [place] [address]   WBC 04/13/2022 9.2  4.0 - 10.5 K/uL Final   RBC 04/13/2022 4.66  4.22 - 5.81 MIL/uL Final   Hemoglobin 04/13/2022 13.4  13.0 - 17.0 g/dL Final   HCT 04/13/2022 39.6  39.0 - 52.0 % Final   MCV 04/13/2022 85.0  80.0 - 100.0 fL Final   MCH 04/13/2022 28.8  26.0 - 34.0 pg Final   MCHC 04/13/2022 33.8  30.0 - 36.0 g/dL Final   RDW 04/13/2022 16.3 (H)  11.5 - 15.5 % Final   Platelets 04/13/2022 341  150 - 400 K/uL Final   REPEATED TO VERIFY   nRBC 04/13/2022 0.0  0.0 - 0.2 % Final   Neutrophils Relative % 04/13/2022 76  % Final   Neutro Abs 04/13/2022 7.0  1.7 - 7.7 K/uL Final   Lymphocytes Relative 04/13/2022 16  % Final   Lymphs Abs 04/13/2022 1.4  0.7 - 4.0 K/uL Final   Monocytes Relative 04/13/2022 7  % Final   Monocytes Absolute 04/13/2022 0.6  0.1 - 1.0 K/uL Final   Eosinophils Relative 04/13/2022 1  % Final   Eosinophils Absolute 04/13/2022 0.1  0.0 - 0.5 K/uL Final   Basophils Relative 04/13/2022 0  % Final   Basophils Absolute 04/13/2022 0.0  0.0 - 0.1 K/uL Final   Immature Granulocytes 04/13/2022 0  % Final   Abs Immature Granulocytes 04/13/2022 0.04  0.00 - 0.07 K/uL Final   Performed at [facility], [place] [address] [place], [place] [address]   Prothrombin Time 04/13/2022 13.5  11.4 - 15.2 seconds Final   INR 04/13/2022 1.0  0.8 - 1.2 Final   Comment: (NOTE) INR goal varies based on device and disease states. Performed at [facility], [place] [address] [place], [place] [address]    Specimen Description 04/13/2022 BLOOD RIGHT  ARM   Final   Special Requests 04/13/2022 BOTTLES DRAWN AEROBIC AND ANAEROBIC Blood Culture results may not be optimal due to an inadequate volume of blood received in culture bottles   Final   Culture 04/13/2022    Final                   Value:NO GROWTH 5 DAYS Performed at [facility], [place] [address] [place], [place] [address]    Report Status  04/13/2022 04/18/2022 FINAL   Final   Specimen Description 04/13/2022 BLOOD LEFT HAND   Final   Special Requests 04/13/2022 BOTTLES DRAWN AEROBIC AND ANAEROBIC Blood Culture results may not be optimal due to an inadequate volume of blood received in culture bottles   Final   Culture 04/13/2022    Final                   Value:NO GROWTH 5 DAYS Performed at [facility], [place] [address], [place], [place] [address]    Report Status 04/13/2022 04/18/2022 FINAL   Final   SARS Coronavirus 2 by RT PCR 04/13/2022 NEGATIVE  NEGATIVE Final   Influenza A by PCR 04/13/2022 NEGATIVE  NEGATIVE Final   Influenza B by PCR 04/13/2022 NEGATIVE  NEGATIVE Final   Comment: (NOTE) The Xpert Xpress SARS-CoV-2/FLU/RSV plus assay is intended as an aid in the diagnosis of influenza from Nasopharyngeal swab specimens and should not be used as a sole basis for treatment. Nasal washings and aspirates are unacceptable for Xpert Xpress SARS-CoV-2/FLU/RSV testing.  Fact Sheet for Patients: EntrepreneurPulse.com.au  Fact Sheet for Healthcare Providers: IncredibleEmployment.be  This test is not yet approved or cleared by the [place] FDA and has been authorized for detection and/or diagnosis of SARS-CoV-2 by FDA under an Emergency Use Authorization (EUA). This EUA will remain in effect (meaning this test can be used) for the duration of the COVID-19 declaration under Section 564(b)(1) of the Act, 21 U.S.C. section 360bbb-3(b)(1), unless the authorization is terminated or revoked.     Resp Syncytial Virus by PCR 04/13/2022 NEGATIVE  NEGATIVE Final   Comment: (NOTE) Fact Sheet for Patients: EntrepreneurPulse.com.au  Fact Sheet for Healthcare Providers: IncredibleEmployment.be  This test is not yet approved or cleared by the [place] FDA and has been authorized for detection and/or diagnosis of SARS-CoV-2 by FDA under an  Emergency Use Authorization (EUA). This EUA will remain in effect (meaning this test can be used) for the duration of the COVID-19 declaration under Section 564(b)(1) of the Act, 21 U.S.C. section 360bbb-3(b)(1), unless the authorization is terminated or revoked.  Performed at [facility], [place] [address] [place], [place] [address]    Troponin I (High Sensitivity) 04/13/2022 52 (H)  <18 ng/L Final   Comment: (NOTE) Elevated high sensitivity troponin I (hsTnI) values and significant  changes across serial measurements may suggest ACS but many other  chronic and acute conditions are known to elevate hsTnI results.  Refer to the "Links" section for chest pain algorithms and additional  guidance. Performed at [facility], [place] [address], [place],  [address]    B Natriuretic Peptide 04/13/2022 29.6  0.0 - 100.0 pg/mL Final   Performed at [facility] [address] [place], [place] [address]   Troponin I (High Sensitivity) 04/13/2022 58 (H)  <18 ng/L Final   Comment: (NOTE) Elevated high sensitivity troponin I (hsTnI) values and significant  changes across serial measurements may suggest ACS  but many other  chronic and acute conditions are known to elevate hsTnI results.  Refer to the "Links" section for chest pain algorithms and additional  guidance. Performed at [facility], [place] [address], [place], [place] [address]    Color, Urine 04/13/2022 YELLOW  YELLOW Final   APPearance 04/13/2022 CLEAR  CLEAR Final   Specific Gravity, Urine 04/13/2022 1.020  1.005 - 1.030 Final   pH 04/13/2022 5.5  5.0 - 8.0 Final   Glucose, UA 04/13/2022 NEGATIVE  NEGATIVE mg/dL Final   Hgb urine dipstick 04/13/2022 NEGATIVE  NEGATIVE Final   Bilirubin Urine 04/13/2022 NEGATIVE  NEGATIVE Final   Ketones, ur 04/13/2022 NEGATIVE  NEGATIVE mg/dL Final   Protein, ur 04/13/2022 30 (A)  NEGATIVE mg/dL Final   Nitrite 04/13/2022 NEGATIVE  NEGATIVE Final   Leukocytes,Ua  04/13/2022 NEGATIVE  NEGATIVE Final   Performed at [facility], [place] [address], [place], [place] [address]   Specimen Description 04/13/2022 URINE, CLEAN CATCH   Final   Special Requests 04/13/2022 NONE   Final   Culture 04/13/2022    Final                   Value:NO GROWTH Performed at [facility], [place] [address], [place], [place] [address]    Report Status 04/13/2022 04/14/2022 FINAL   Final   Opiates 04/13/2022 NONE DETECTED  NONE DETECTED Final   Cocaine 04/13/2022 NONE DETECTED  NONE DETECTED Final   Benzodiazepines 04/13/2022 NONE DETECTED  NONE DETECTED Final   Amphetamines 04/13/2022 POSITIVE (A)  NONE DETECTED Final   Tetrahydrocannabinol 04/13/2022 NONE DETECTED  NONE DETECTED Final   Barbiturates 04/13/2022 NONE DETECTED  NONE DETECTED Final   Comment: (NOTE) DRUG SCREEN FOR MEDICAL PURPOSES ONLY.  IF CONFIRMATION IS NEEDED FOR ANY PURPOSE, NOTIFY LAB WITHIN 5 DAYS.  LOWEST DETECTABLE LIMITS FOR URINE DRUG SCREEN Drug Class                     Cutoff (ng/mL) Amphetamine and metabolites    1000 Barbiturate and metabolites    200 Benzodiazepine                 200 Opiates and metabolites        300 Cocaine and metabolites        300 THC                            50 Performed at [facility], [place] [address] [place], [place] [address]    Total CK 04/13/2022 177  49 - 397 U/L Final   Performed at [facility], [place] [address] [place], [place] [address]   Lactic Acid, Venous 04/13/2022 1.8  0.5 - 1.9 mmol/L Final   Performed at [facility] [address], [place], [place] [address]   RBC / HPF 04/13/2022 0-5  0 - 5 RBC/hpf Final   WBC, UA 04/13/2022 0-5  0 - 5 WBC/hpf Final   Bacteria, UA 04/13/2022 NONE SEEN  NONE SEEN Final   Squamous Epithelial / HPF 04/13/2022 0-5  0 - 5 /HPF Final   Mucus 04/13/2022 PRESENT   Final   Hyaline Casts, UA 04/13/2022 PRESENT   Final   Performed at [facility], [place] [address], [place],  [place] [address]   Glucose-Capillary 04/13/2022 113 (H)  70 - 99 mg/dL Final  Glucose reference range applies only to samples taken after fasting for at least 8 hours.   Glucose-Capillary 04/14/2022 135 (H)  70 - 99 mg/dL Final   Glucose reference range applies only to samples taken after fasting for at least 8 hours.   Sodium 04/14/2022 138  135 - 145 mmol/L Final   Potassium 04/14/2022 3.8  3.5 - 5.1 mmol/L Final   Chloride 04/14/2022 101  98 - 111 mmol/L Final   CO2 04/14/2022 23  22 - 32 mmol/L Final   Glucose, Bld 04/14/2022 163 (H)  70 - 99 mg/dL Final   Glucose reference range applies only to samples taken after fasting for at least 8 hours.   BUN 04/14/2022 18  8 - 23 mg/dL Final   Creatinine, Ser 04/14/2022 1.12  0.61 - 1.24 mg/dL Final   Calcium 04/14/2022 8.3 (L)  8.9 - 10.3 mg/dL Final   GFR, Estimated 04/14/2022 >60  >60 mL/min Final   Comment: (NOTE) Calculated using the CKD-EPI Creatinine Equation (2021)    Anion gap 04/14/2022 14  5 - 15 Final   Performed at [facility], [place] [address] [place], [place] [address]   Glucose-Capillary 04/14/2022 187 (H)  70 - 99 mg/dL Final   Glucose reference range applies only to samples taken after fasting for at least 8 hours.  Admission on 04/05/2022, Discharged on 04/08/2022  Component Date Value Ref Range Status   Sodium 04/05/2022 136  135 - 145 mmol/L Final   Potassium 04/05/2022 4.0  3.5 - 5.1 mmol/L Final   Chloride 04/05/2022 100  98 - 111 mmol/L Final   CO2 04/05/2022 26  22 - 32 mmol/L Final   Glucose, Bld 04/05/2022 76  70 - 99 mg/dL Final   Glucose reference range applies only to samples taken after fasting for at least 8 hours.   BUN 04/05/2022 20  8 - 23 mg/dL Final   Creatinine, Ser 04/05/2022 1.31 (H)  0.61 - 1.24 mg/dL Final   Calcium 04/05/2022 8.6 (L)  8.9 - 10.3 mg/dL Final   Total Protein 04/05/2022 7.8  6.5 - 8.1 g/dL Final   Albumin 04/05/2022 3.6  3.5 - 5.0 g/dL Final   AST 04/05/2022 14 (L)  15 - 41 U/L  Final   ALT 04/05/2022 8  0 - 44 U/L Final   Alkaline Phosphatase 04/05/2022 93  38 - 126 U/L Final   Total Bilirubin 04/05/2022 0.4  0.3 - 1.2 mg/dL Final   GFR, Estimated 04/05/2022 >60  >60 mL/min Final   Comment: (NOTE) Calculated using the CKD-EPI Creatinine Equation (2021)    Anion gap 04/05/2022 10  5 - 15 Final   Performed at [facility], [place] [address], [place], [place] [address]   Alcohol, Ethyl (B) 04/05/2022 <10  <10 mg/dL Final   Comment: (NOTE) Lowest detectable limit for serum alcohol is 10 mg/dL.  For medical purposes only. Performed at [facility], [place] [address], [place], [place] [address]    WBC 04/05/2022 6.7  4.0 - 10.5 K/uL Final   RBC 04/05/2022 3.96 (L)  4.22 - 5.81 MIL/uL Final   Hemoglobin 04/05/2022 10.9 (L)  13.0 - 17.0 g/dL Final   HCT 04/05/2022 35.2 (L)  39.0 - 52.0 % Final   MCV 04/05/2022 88.9  80.0 - 100.0 fL Final   MCH 04/05/2022 27.5  26.0 - 34.0 pg Final   MCHC 04/05/2022 31.0  30.0 - 36.0 g/dL Final   RDW 04/05/2022 17.0 (  H)  11.5 - 15.5 % Final   Platelets 04/05/2022 312  150 - 400 K/uL Final   nRBC 04/05/2022 0.0  0.0 - 0.2 % Final   Neutrophils Relative % 04/05/2022 65  % Final   Neutro Abs 04/05/2022 4.4  1.7 - 7.7 K/uL Final   Lymphocytes Relative 04/05/2022 23  % Final   Lymphs Abs 04/05/2022 1.5  0.7 - 4.0 K/uL Final   Monocytes Relative 04/05/2022 8  % Final   Monocytes Absolute 04/05/2022 0.6  0.1 - 1.0 K/uL Final   Eosinophils Relative 04/05/2022 3  % Final   Eosinophils Absolute 04/05/2022 0.2  0.0 - 0.5 K/uL Final   Basophils Relative 04/05/2022 1  % Final   Basophils Absolute 04/05/2022 0.1  0.0 - 0.1 K/uL Final   Immature Granulocytes 04/05/2022 0  % Final   Abs Immature Granulocytes 04/05/2022 0.01  0.00 - 0.07 K/uL Final   Performed at [facility], [place] [address] [place], [place] [address]   SARS Coronavirus 2 by RT PCR 04/05/2022 NEGATIVE  NEGATIVE Final    Comment: (NOTE) SARS-CoV-2 target nucleic acids are NOT DETECTED.  The SARS-CoV-2 RNA is generally detectable in upper respiratory specimens during the acute phase of infection. The lowest concentration of SARS-CoV-2 viral copies this assay can detect is 138 copies/mL. A negative result does not preclude SARS-Cov-2 infection and should not be used as the sole basis for treatment or other patient management decisions. A negative result may occur with  improper specimen collection/handling, submission of specimen other than nasopharyngeal swab, presence of viral mutation(s) within the areas targeted by this assay, and inadequate number of viral copies(<138 copies/mL). A negative result must be combined with clinical observations, patient history, and epidemiological information. The expected result is Negative.  Fact Sheet for Patients:  EntrepreneurPulse.com.au  Fact Sheet for Healthcare Providers:  IncredibleEmployment.be  This test is no                          t yet approved or cleared by the [place] FDA and  has been authorized for detection and/or diagnosis of SARS-CoV-2 by FDA under an Emergency Use Authorization (EUA). This EUA will remain  in effect (meaning this test can be used) for the duration of the COVID-19 declaration under Section 564(b)(1) of the Act, 21 U.S.C.section 360bbb-3(b)(1), unless the authorization is terminated  or revoked sooner.       Influenza A by PCR 04/05/2022 NEGATIVE  NEGATIVE Final   Influenza B by PCR 04/05/2022 NEGATIVE  NEGATIVE Final   Comment: (NOTE) The Xpert Xpress SARS-CoV-2/FLU/RSV plus assay is intended as an aid in the diagnosis of influenza from Nasopharyngeal swab specimens and should not be used as a sole basis for treatment. Nasal washings and aspirates are unacceptable for Xpert Xpress SARS-CoV-2/FLU/RSV testing.  Fact Sheet for  Patients: EntrepreneurPulse.com.au  Fact Sheet for Healthcare Providers: IncredibleEmployment.be  This test is not yet approved or cleared by the [place] FDA and has been authorized for detection and/or diagnosis of SARS-CoV-2 by FDA under an Emergency Use Authorization (EUA). This EUA will remain in effect (meaning this test can be used) for the duration of the COVID-19 declaration under Section 564(b)(1) of the Act, 21 U.S.C. section 360bbb-3(b)(1), unless the authorization is terminated or revoked.     Resp Syncytial Virus by PCR 04/05/2022 NEGATIVE  NEGATIVE Final   Comment: (NOTE) Fact Sheet for Patients: EntrepreneurPulse.com.au  Fact Sheet for Healthcare  Providers: IncredibleEmployment.be  This test is not yet approved or cleared by the [place] and has been authorized for detection and/or diagnosis of SARS-CoV-2 by FDA under an Emergency Use Authorization (EUA). This EUA will remain in effect (meaning this test can be used) for the duration of the COVID-19 declaration under Section 564(b)(1) of the Act, 21 U.S.C. section 360bbb-3(b)(1), unless the authorization is terminated or revoked.  Performed at [facility], [place] [address], [place], [place] [address]    SARS Coronavirus 2 by RT PCR 04/06/2022 NEGATIVE  NEGATIVE Final   Comment: (NOTE) SARS-CoV-2 target nucleic acids are NOT DETECTED.  The SARS-CoV-2 RNA is generally detectable in upper respiratory specimens during the acute phase of infection. The lowest concentration of SARS-CoV-2 viral copies this assay can detect is 138 copies/mL. A negative result does not preclude SARS-Cov-2 infection and should not be used as the sole basis for treatment or other patient management decisions. A negative result may occur with  improper specimen collection/handling, submission of specimen other than nasopharyngeal  swab, presence of viral mutation(s) within the areas targeted by this assay, and inadequate number of viral copies(<138 copies/mL). A negative result must be combined with clinical observations, patient history, and epidemiological information. The expected result is Negative.  Fact Sheet for Patients:  EntrepreneurPulse.com.au  Fact Sheet for Healthcare Providers:  IncredibleEmployment.be  This test is no                          t yet approved or cleared by the [place] FDA and  has been authorized for detection and/or diagnosis of SARS-CoV-2 by FDA under an Emergency Use Authorization (EUA). This EUA will remain  in effect (meaning this test can be used) for the duration of the COVID-19 declaration under Section 564(b)(1) of the Act, 21 U.S.C.section 360bbb-3(b)(1), unless the authorization is terminated  or revoked sooner.       Influenza A by PCR 04/06/2022 NEGATIVE  NEGATIVE Final   Influenza B by PCR 04/06/2022 NEGATIVE  NEGATIVE Final   Comment: (NOTE) The Xpert Xpress SARS-CoV-2/FLU/RSV plus assay is intended as an aid in the diagnosis of influenza from Nasopharyngeal swab specimens and should not be used as a sole basis for treatment. Nasal washings and aspirates are unacceptable for Xpert Xpress SARS-CoV-2/FLU/RSV testing.  Fact Sheet for Patients: EntrepreneurPulse.com.au  Fact Sheet for Healthcare Providers: IncredibleEmployment.be  This test is not yet approved or cleared by the [place] FDA and has been authorized for detection and/or diagnosis of SARS-CoV-2 by FDA under an Emergency Use Authorization (EUA). This EUA will remain in effect (meaning this test can be used) for the duration of the COVID-19 declaration under Section 564(b)(1) of the Act, 21 U.S.C. section 360bbb-3(b)(1), unless the authorization is terminated or revoked.     Resp Syncytial Virus by PCR 04/06/2022  NEGATIVE  NEGATIVE Final   Comment: (NOTE) Fact Sheet for Patients: EntrepreneurPulse.com.au  Fact Sheet for Healthcare Providers: IncredibleEmployment.be  This test is not yet approved or cleared by the [place] FDA and has been authorized for detection and/or diagnosis of SARS-CoV-2 by FDA under an Emergency Use Authorization (EUA). This EUA will remain in effect (meaning this test can be used) for the duration of the COVID-19 declaration under Section 564(b)(1) of the Act, 21 U.S.C. section 360bbb-3(b)(1), unless the authorization is terminated or revoked.  Performed at [facility], [place] [address] [place], [place] [address]  Sodium 04/06/2022 133 (L)  135 - 145 mmol/L Final   Potassium 04/06/2022 4.9  3.5 - 5.1 mmol/L Final   Chloride 04/06/2022 98  98 - 111 mmol/L Final   CO2 04/06/2022 25  22 - 32 mmol/L Final   Glucose, Bld 04/06/2022 146 (H)  70 - 99 mg/dL Final   Glucose reference range applies only to samples taken after fasting for at least 8 hours.   BUN 04/06/2022 29 (H)  8 - 23 mg/dL Final   Creatinine, Ser 04/06/2022 2.12 (H)  0.61 - 1.24 mg/dL Final   Calcium 04/06/2022 8.6 (L)  8.9 - 10.3 mg/dL Final   Total Protein 04/06/2022 8.1  6.5 - 8.1 g/dL Final   Albumin 04/06/2022 3.3 (L)  3.5 - 5.0 g/dL Final   AST 04/06/2022 17  15 - 41 U/L Final   ALT 04/06/2022 10  0 - 44 U/L Final   Alkaline Phosphatase 04/06/2022 92  38 - 126 U/L Final   Total Bilirubin 04/06/2022 0.7  0.3 - 1.2 mg/dL Final   GFR, Estimated 04/06/2022 34 (L)  >60 mL/min Final   Comment: (NOTE) Calculated using the CKD-EPI Creatinine Equation (2021)    Anion gap 04/06/2022 10  5 - 15 Final   Performed at [facility], [place] [address] [place], [place] [address]   WBC 04/06/2022 11.8 (H)  4.0 - 10.5 K/uL Final   RBC 04/06/2022 4.13 (L)  4.22 - 5.81 MIL/uL Final   Hemoglobin 04/06/2022 11.6 (L)  13.0 - 17.0 g/dL Final    HCT 04/06/2022 37.4 (L)  39.0 - 52.0 % Final   MCV 04/06/2022 90.6  80.0 - 100.0 fL Final   MCH 04/06/2022 28.1  26.0 - 34.0 pg Final   MCHC 04/06/2022 31.0  30.0 - 36.0 g/dL Final   RDW 04/06/2022 17.0 (H)  11.5 - 15.5 % Final   Platelets 04/06/2022 309  150 - 400 K/uL Final   nRBC 04/06/2022 0.0  0.0 - 0.2 % Final   Neutrophils Relative % 04/06/2022 81  % Final   Neutro Abs 04/06/2022 9.5 (H)  1.7 - 7.7 K/uL Final   Lymphocytes Relative 04/06/2022 11  % Final   Lymphs Abs 04/06/2022 1.3  0.7 - 4.0 K/uL Final   Monocytes Relative 04/06/2022 7  % Final   Monocytes Absolute 04/06/2022 0.9  0.1 - 1.0 K/uL Final   Eosinophils Relative 04/06/2022 1  % Final   Eosinophils Absolute 04/06/2022 0.1  0.0 - 0.5 K/uL Final   Basophils Relative 04/06/2022 0  % Final   Basophils Absolute 04/06/2022 0.0  0.0 - 0.1 K/uL Final   Immature Granulocytes 04/06/2022 0  % Final   Abs Immature Granulocytes 04/06/2022 0.03  0.00 - 0.07 K/uL Final   Performed at [facility], [place] [address], [place], [place] [address]   B Natriuretic Peptide 04/06/2022 39.0  0.0 - 100.0 pg/mL Final   Performed at [place] [address] [place], [place] [address]   Troponin I (High Sensitivity) 04/06/2022 13  <18 ng/L Final   Comment: (NOTE) Elevated high sensitivity troponin I (hsTnI) values and significant  changes across serial measurements may suggest ACS but many other  chronic and acute conditions are known to elevate hsTnI results.  Refer to the "Links" section for chest pain algorithms and additional  guidance. Performed at [facility], [place] [address] [place], [place] [address]    Specimen Description 04/07/2022  Final                   Value:BLOOD BLOOD RIGHT ARM Performed at [place] [address], [place], [place] [address]    Special Requests 04/07/2022    Final                   Value:BOTTLES DRAWN AEROBIC ONLY  Blood Culture adequate volume Performed at [facility], [place] [address], [place], [place] [address]    Culture 04/07/2022    Final                   Value:NO GROWTH 5 DAYS Performed at [place] [address], [place], [place] [address]    Report Status 04/07/2022 04/12/2022 FINAL   Final   Specimen Description 04/07/2022    Final                   Value:BLOOD BLOOD LEFT ARM Performed at [facility], [place] [address] [place], [place] [address]    Special Requests 04/07/2022    Final                   Value:BOTTLES DRAWN AEROBIC ONLY Blood Culture adequate volume Performed at [facility], [place] [address], [place], [place] [address]    Culture 04/07/2022    Final                   Value:NO GROWTH 5 DAYS Performed at [place] [address] [place], [place] [address]    Report Status 04/07/2022 04/12/2022 FINAL   Final   Lactic Acid, Venous 04/07/2022 1.3  0.5 - 1.9 mmol/L Final   Performed at [place] [address] [place], [place] [address]   HIV Screen 4th Generation wRfx 04/07/2022 Non Reactive  Non Reactive Final   Performed at [facility], [place] [address], [place], [place] [address]   WBC 04/07/2022 8.4  4.0 - 10.5 K/uL Final   RBC 04/07/2022 3.69 (L)  4.22 - 5.81 MIL/uL Final   Hemoglobin 04/07/2022 10.3 (L)  13.0 - 17.0 g/dL Final   HCT 04/07/2022 34.3 (L)  39.0 - 52.0 % Final   MCV 04/07/2022 93.0  80.0 - 100.0 fL Final   MCH 04/07/2022 27.9  26.0 - 34.0 pg Final   MCHC 04/07/2022 30.0  30.0 - 36.0 g/dL Final   RDW 04/07/2022 17.2 (H)  11.5 - 15.5 % Final   Platelets 04/07/2022 279  150 - 400 K/uL Final   nRBC 04/07/2022 0.0  0.0 - 0.2 % Final   Performed at [facility], [place] [address] [place], [place] [address]   Creatinine, Ser 04/07/2022 1.80 (H)  0.61 - 1.24 mg/dL Final   GFR, Estimated 04/07/2022 42 (L)  >60 mL/min Final   Comment:  (NOTE) Calculated using the CKD-EPI Creatinine Equation (2021) Performed at [facility], [place] [address] [place], [place] [address]    Sodium 04/07/2022 134 (L)  135 - 145 mmol/L Final   Potassium 04/07/2022 4.5  3.5 - 5.1 mmol/L Final   Chloride 04/07/2022 100  98 - 111 mmol/L Final   CO2 04/07/2022 25  22 - 32 mmol/L Final   Glucose, Bld 04/07/2022 159 (H)  70 - 99 mg/dL Final   Glucose reference range applies only to samples taken after fasting for at least 8 hours.  BUN 04/07/2022 31 (H)  8 - 23 mg/dL Final   Creatinine, Ser 04/07/2022 1.50 (H)  0.61 - 1.24 mg/dL Final   Calcium 04/07/2022 8.4 (L)  8.9 - 10.3 mg/dL Final   GFR, Estimated 04/07/2022 52 (L)  >60 mL/min Final   Comment: (NOTE) Calculated using the CKD-EPI Creatinine Equation (2021)    Anion gap 04/07/2022 9  5 - 15 Final   Performed at [facility], [place] [address] [place], [place] [address]   WBC 04/07/2022 6.8  4.0 - 10.5 K/uL Final   RBC 04/07/2022 3.57 (L)  4.22 - 5.81 MIL/uL Final   Hemoglobin 04/07/2022 9.9 (L)  13.0 - 17.0 g/dL Final   HCT 04/07/2022 32.3 (L)  39.0 - 52.0 % Final   MCV 04/07/2022 90.5  80.0 - 100.0 fL Final   MCH 04/07/2022 27.7  26.0 - 34.0 pg Final   MCHC 04/07/2022 30.7  30.0 - 36.0 g/dL Final   RDW 04/07/2022 16.9 (H)  11.5 - 15.5 % Final   Platelets 04/07/2022 264  150 - 400 K/uL Final   nRBC 04/07/2022 0.0  0.0 - 0.2 % Final   Neutrophils Relative % 04/07/2022 91  % Final   Neutro Abs 04/07/2022 6.2  1.7 - 7.7 K/uL Final   Lymphocytes Relative 04/07/2022 7  % Final   Lymphs Abs 04/07/2022 0.5 (L)  0.7 - 4.0 K/uL Final   Monocytes Relative 04/07/2022 2  % Final   Monocytes Absolute 04/07/2022 0.1  0.1 - 1.0 K/uL Final   Eosinophils Relative 04/07/2022 0  % Final   Eosinophils Absolute 04/07/2022 0.0  0.0 - 0.5 K/uL Final   Basophils Relative 04/07/2022 0  % Final   Basophils Absolute 04/07/2022 0.0  0.0 - 0.1 K/uL Final   Immature  Granulocytes 04/07/2022 0  % Final   Abs Immature Granulocytes 04/07/2022 0.03  0.00 - 0.07 K/uL Final   Performed at [facility], [place] [address], [place], [place] [address]   Total CK 04/07/2022 164  49 - 397 U/L Final   Performed at [facility], [place] [address] [place], [place] [address]   Hgb A1c MFr Bld 04/07/2022 6.3 (H)  4.8 - 5.6 % Final   Comment: (NOTE) Pre diabetes:          5.7%-6.4%  Diabetes:              >6.4%  Glycemic control for   <7.0% adults with diabetes    Mean Plasma Glucose 04/07/2022 134.11  mg/dL Final   Performed at [facility], [place] [address] [place], [place] [address]   Glucose-Capillary 04/07/2022 156 (H)  70 - 99 mg/dL Final   Glucose reference range applies only to samples taken after fasting for at least 8 hours.   Glucose-Capillary 04/07/2022 168 (H)  70 - 99 mg/dL Final   Glucose reference range applies only to samples taken after fasting for at least 8 hours.   Glucose-Capillary 04/07/2022 148 (H)  70 - 99 mg/dL Final   Glucose reference range applies only to samples taken after fasting for at least 8 hours.   WBC 04/08/2022 6.2  4.0 - 10.5 K/uL Final   RBC 04/08/2022 3.57 (L)  4.22 - 5.81 MIL/uL Final   Hemoglobin 04/08/2022 9.9 (L)  13.0 - 17.0 g/dL Final   HCT 04/08/2022 31.7 (L)  39.0 - 52.0 % Final   MCV 04/08/2022 88.8  80.0 - 100.0 fL Final  MCH 04/08/2022 27.7  26.0 - 34.0 pg Final   MCHC 04/08/2022 31.2  30.0 - 36.0 g/dL Final   RDW 04/08/2022 16.6 (H)  11.5 - 15.5 % Final   Platelets 04/08/2022 270  150 - 400 K/uL Final   nRBC 04/08/2022 0.0  0.0 - 0.2 % Final   Performed at [facility], [place] [address], [place], [place] [address]   Sodium 04/08/2022 134 (L)  135 - 145 mmol/L Final   Potassium 04/08/2022 4.0  3.5 - 5.1 mmol/L Final   Chloride 04/08/2022 98  98 - 111 mmol/L Final   CO2 04/08/2022 26  22 - 32 mmol/L Final   Glucose, Bld 04/08/2022 115 (H)  70 - 99 mg/dL  Final   Glucose reference range applies only to samples taken after fasting for at least 8 hours.   BUN 04/08/2022 29 (H)  8 - 23 mg/dL Final   Creatinine, Ser 04/08/2022 1.06  0.61 - 1.24 mg/dL Final   Calcium 04/08/2022 8.3 (L)  8.9 - 10.3 mg/dL Final   GFR, Estimated 04/08/2022 >60  >60 mL/min Final   Comment: (NOTE) Calculated using the CKD-EPI Creatinine Equation (2021)    Anion gap 04/08/2022 10  5 - 15 Final   Performed at [facility], [place] [address] [place], [place] [address]   Glucose-Capillary 04/07/2022 183 (H)  70 - 99 mg/dL Final   Glucose reference range applies only to samples taken after fasting for at least 8 hours.   Comment 1 04/07/2022 Notify RN   Final   Comment 2 04/07/2022 Document in Chart   Final   Glucose-Capillary 04/08/2022 117 (H)  70 - 99 mg/dL Final   Glucose reference range applies only to samples taken after fasting for at least 8 hours.  Admission on 03/09/2022, Discharged on 03/09/2022  Component Date Value Ref Range Status   Sodium 03/09/2022 134 (L)  135 - 145 mmol/L Final   Potassium 03/09/2022 4.0  3.5 - 5.1 mmol/L Final   Chloride 03/09/2022 98  98 - 111 mmol/L Final   CO2 03/09/2022 27  22 - 32 mmol/L Final   Glucose, Bld 03/09/2022 151 (H)  70 - 99 mg/dL Final   Glucose reference range applies only to samples taken after fasting for at least 8 hours.   BUN 03/09/2022 13  8 - 23 mg/dL Final   Creatinine, Ser 03/09/2022 1.52 (H)  0.61 - 1.24 mg/dL Final   Calcium 03/09/2022 9.1  8.9 - 10.3 mg/dL Final   Total Protein 03/09/2022 7.3  6.5 - 8.1 g/dL Final   Albumin 03/09/2022 3.3 (L)  3.5 - 5.0 g/dL Final   AST 03/09/2022 17  15 - 41 U/L Final   ALT 03/09/2022 11  0 - 44 U/L Final   Alkaline Phosphatase 03/09/2022 105  38 - 126 U/L Final   Total Bilirubin 03/09/2022 0.5  0.3 - 1.2 mg/dL Final   GFR, Estimated 03/09/2022 51 (L)  >60 mL/min Final   Comment: (NOTE) Calculated using the CKD-EPI Creatinine Equation (2021)     Anion gap 03/09/2022 9  5 - 15 Final   Performed at [place] [address] [place], [place] [address]   WBC 03/09/2022 8.2  4.0 - 10.5 K/uL Final   RBC 03/09/2022 4.26  4.22 - 5.81 MIL/uL Final   Hemoglobin 03/09/2022 11.5 (L)  13.0 - 17.0 g/dL Final   HCT 03/09/2022 37.0 (L)  39.0 - 52.0 % Final  MCV 03/09/2022 86.9  80.0 - 100.0 fL Final   MCH 03/09/2022 27.0  26.0 - 34.0 pg Final   MCHC 03/09/2022 31.1  30.0 - 36.0 g/dL Final   RDW 03/09/2022 17.1 (H)  11.5 - 15.5 % Final   Platelets 03/09/2022 350  150 - 400 K/uL Final   nRBC 03/09/2022 0.0  0.0 - 0.2 % Final   Performed at [facility], [place] [address], [place], [place] [address]   Glucose-Capillary 03/09/2022 137 (H)  70 - 99 mg/dL Final   Glucose reference range applies only to samples taken after fasting for at least 8 hours.  Admission on 01/10/2022, Discharged on 01/10/2022  Component Date Value Ref Range Status   Glucose-Capillary 01/10/2022 213 (H)  70 - 99 mg/dL Final   Glucose reference range applies only to samples taken after fasting for at least 8 hours.   Sodium 01/10/2022 133 (L)  135 - 145 mmol/L Final   Potassium 01/10/2022 4.0  3.5 - 5.1 mmol/L Final   Chloride 01/10/2022 102  98 - 111 mmol/L Final   CO2 01/10/2022 20 (L)  22 - 32 mmol/L Final   Glucose, Bld 01/10/2022 228 (H)  70 - 99 mg/dL Final   Glucose reference range applies only to samples taken after fasting for at least 8 hours.   BUN 01/10/2022 20  8 - 23 mg/dL Final   Creatinine, Ser 01/10/2022 2.41 (H)  0.61 - 1.24 mg/dL Final   Calcium 01/10/2022 8.8 (L)  8.9 - 10.3 mg/dL Final   Total Protein 01/10/2022 7.1  6.5 - 8.1 g/dL Final   Albumin 01/10/2022 3.3 (L)  3.5 - 5.0 g/dL Final   AST 01/10/2022 20  15 - 41 U/L Final   ALT 01/10/2022 8  0 - 44 U/L Final   Alkaline Phosphatase 01/10/2022 79  38 - 126 U/L Final   Total Bilirubin 01/10/2022 0.3  0.3 - 1.2 mg/dL Final   GFR, Estimated 01/10/2022 29 (L)  >60 mL/min Final   Comment:  (NOTE) Calculated using the CKD-EPI Creatinine Equation (2021)    Anion gap 01/10/2022 11  5 - 15 Final   Performed at [place] [address] [place], [place] [address]   WBC 01/10/2022 9.0  4.0 - 10.5 K/uL Final   RBC 01/10/2022 3.84 (L)  4.22 - 5.81 MIL/uL Final   Hemoglobin 01/10/2022 10.5 (L)  13.0 - 17.0 g/dL Final   HCT 01/10/2022 34.3 (L)  39.0 - 52.0 % Final   MCV 01/10/2022 89.3  80.0 - 100.0 fL Final   MCH 01/10/2022 27.3  26.0 - 34.0 pg Final   MCHC 01/10/2022 30.6  30.0 - 36.0 g/dL Final   RDW 01/10/2022 15.8 (H)  11.5 - 15.5 % Final   Platelets 01/10/2022 379  150 - 400 K/uL Final   nRBC 01/10/2022 0.0  0.0 - 0.2 % Final   Neutrophils Relative % 01/10/2022 78  % Final   Neutro Abs 01/10/2022 6.9  1.7 - 7.7 K/uL Final   Lymphocytes Relative 01/10/2022 15  % Final   Lymphs Abs 01/10/2022 1.3  0.7 - 4.0 K/uL Final   Monocytes Relative 01/10/2022 6  % Final   Monocytes Absolute 01/10/2022 0.6  0.1 - 1.0 K/uL Final   Eosinophils Relative 01/10/2022 1  % Final   Eosinophils Absolute 01/10/2022 0.1  0.0 - 0.5 K/uL Final   Basophils Relative 01/10/2022 0  % Final   Basophils Absolute 01/10/2022 0.0  0.0 -  0.1 K/uL Final   Immature Granulocytes 01/10/2022 0  % Final   Abs Immature Granulocytes 01/10/2022 0.02  0.00 - 0.07 K/uL Final   Performed at [facility], [place] [address], [place], [place] [address]   Lactic Acid, Venous 01/10/2022 3.7 (HH)  0.5 - 1.9 mmol/L Final   Comment: CRITICAL RESULT CALLED TO, READ BACK BY AND VERIFIED WITH C.ROE,RN. [id number] 01/10/22.PAIT,L Performed at [facility], [place] [address], [place], [place] [address]    Prothrombin Time 01/10/2022 15.3 (H)  11.4 - 15.2 seconds Final   INR 01/10/2022 1.2  0.8 - 1.2 Final   Comment: (NOTE) INR goal varies based on device and disease states. Performed at [facility], [place] [address] [address] [place], [place] [address]    Specimen Description 01/10/2022 BLOOD RIGHT ANTECUBITAL   Final    Special Requests 01/10/2022 BOTTLES DRAWN AEROBIC AND ANAEROBIC Blood Culture adequate volume   Final   Culture 01/10/2022    Final                   Value:NO GROWTH 5 DAYS Performed at [facility], [place] [address], [place], [place] [address]    Report Status 01/10/2022 01/15/2022 FINAL   Final   Specimen Description 01/10/2022 BLOOD SITE NOT SPECIFIED   Final   Special Requests 01/10/2022 BOTTLES DRAWN AEROBIC AND ANAEROBIC Blood Culture results may not be optimal due to an excessive volume of blood received in culture bottles   Final   Culture 01/10/2022    Final                   Value:NO GROWTH 5 DAYS Performed at [facility], [place] [address], [place], [place] [address]    Report Status 01/10/2022 01/15/2022 FINAL   Final   Lactic Acid, Venous 01/10/2022 2.4 (HH)  0.5 - 1.9 mmol/L Final   Comment: CRITICAL VALUE NOTED. VALUE IS CONSISTENT WITH PREVIOUSLY REPORTED/CALLED VALUE Performed at [facility], [place] [address] [place], [place] [address]     Blood Alcohol level:  Lab Results  Component Value Date   [facility] <10 05/01/2022   ETH <10 [id number]    Metabolic Disorder Labs: Lab Results  Component Value Date   HGBA1C 6.3 (H) 04/07/2022   MPG 134.11 04/07/2022   MPG 277.61 03/02/2020   Lab Results  Component Value Date   PROLACTIN 21.0 (H) 05/26/2015   Lab Results  Component Value Date   CHOL 143 05/03/2022   TRIG 221 (H) 05/03/2022   HDL 35 (L) 05/03/2022   CHOLHDL 4.1 05/03/2022   VLDL 44 (H) 05/03/2022   LDLCALC 64 05/03/2022   LDLCALC 81 03/06/2020    Therapeutic Lab Levels: No results found for: "LITHIUM" No results found for: "VALPROATE" Lab Results  Component Value Date   CBMZ 8.9 05/28/2015    Physical Findings   AIMS    Flowsheet Row Admission (Discharged) from 05/23/2015 in [facility]  AIMS Total Score 0      AUDIT    Flowsheet Row Admission (Discharged) from 05/23/2015 in [facility]  Alcohol Use Disorder Identification Test Final Score (AUDIT) 0      PHQ2-9    Flowsheet Row ED from 05/03/2022 in [facility] ED from 05/01/2022 in [facility] Emergency Department at [facility]  PHQ-2 Total Score 6 5  PHQ-9 Total Score 22 19  [place] ED from 05/03/2022 in [facility] ED from 05/01/2022 in [facility] Emergency Department at [facility] ED to Hosp-Admission (Discharged) from 04/13/2022 in [place] No Risk Error: Q3, 4, or 5 should not be populated when Q2 is No No Risk        Musculoskeletal  Strength & Muscle Tone: decreased Gait & Station:  walks with walker Patient leans: Front  Psychiatric Specialty Exam  Presentation  General Appearance:  Disheveled  Eye Contact: Good  Speech: Clear and Coherent  Speech Volume: Normal  Handedness: Right   Mood and Affect  Mood: Euthymic  Affect: Appropriate   Thought Process  Thought Processes: Coherent  Descriptions of Associations:Intact  Orientation:Full (Time, Place and Person)  Thought Content:Delusions (but reality testing)  Diagnosis of Schizophrenia or Schizoaffective disorder in past: -- (unknown, patient says no but has an ACTT)  Duration of Psychotic Symptoms: Less than six months   Hallucinations:Hallucinations: Auditory Description of Auditory Hallucinations: Hearing voices of some people he recognizes and some he does not  Ideas of Reference:Paranoia; Delusions  Suicidal Thoughts:Suicidal Thoughts: No SI Passive Intent and/or Plan: Without Intent; Without Plan  Homicidal Thoughts:Homicidal Thoughts: No   Sensorium  Memory: Immediate Fair; Recent Fair  Judgment: -- (IMproving)  Insight: Fair   Community education officer  Concentration: Fair  Attention Span: Fair  Recall: [employer] of  Knowledge: Fair  Language: Fair   Psychomotor Activity  Psychomotor Activity: Psychomotor Activity: Decreased   Assets  Assets: Desire for Improvement; Resilience; Housing   Sleep  Sleep: Sleep: Fair Number of Hours of Sleep: -1   Nutritional Assessment (For OBS and FBC admissions only) Has the patient had a weight loss or gain of 10 pounds or more in the last 3 months?: No Has the patient had a decrease in food intake/or appetite?: No Does the patient have dental problems?: No Does the patient have eating habits or behaviors that may be indicators of an eating disorder including binging or inducing vomiting?: No Has the patient recently lost weight without trying?: 0 Has the patient been eating poorly because of a decreased appetite?: 0 Malnutrition Screening Tool Score: 0    Physical Exam  Physical Exam HENT:     Head: Normocephalic and atraumatic.  Pulmonary:     Effort: Pulmonary effort is normal.  Neurological:     Mental Status: He is alert and oriented to person, place, and time.    Review of Systems  Psychiatric/Behavioral:  Negative for suicidal ideas. The patient does not have insomnia.    Blood pressure 122/74, pulse 81, temperature 98.1 F (36.7 C), temperature source Oral, resp. rate 16, SpO2 100 %. There is no height or weight on file to calculate BMI.  Treatment Plan Summary: Daily contact with patient to assess and evaluate symptoms and progress in treatment and Medication management  Based on assessment today, patient's psychotic symptoms appear to be more related to stimulant abuse. Patient is able to reality test his paranoid delusions and the AH that is creating the paranoia is decreasing. With patient's current Qtc will not add antipsychotic and continue to monitor patinet. It appears that the longer he is sober, the less psychotic symptoms he has.   Repeat EKG: Qtc 484 with HR 93 (3/10)   Substance induced psychosis Stimulant use  disorder, severe (meth) Cannabis use disorder Opioid use d/o, in remission MDD ( vs Bipolar d/o) - Reach out to  ACTT to let them know he is at [facility] (not able to get on weekends) - Continue home Zoloft '75mg'$  daily (if NA continues to fall, will dc) - Continue Trazodone '50mg'$  QHS   Chronic leg pain - Gabapentin '1200mg'$  TID   Hyponatremia Patient albumin suggest possible malnutrition, will continue to monitor. Patient Na has dropped 3 points in 2 days. Patient was very dehydrated when he presented to [facility], may need time to balance out hydration given in ED.  - Repeat CMP 3/11     Medical - Continue home meds   PGY-3 Freida Busman, MD 05/04/2022 2:08 PM

## 2022-05-04 NOTE — ED Notes (Signed)
Pt awake up to nurses station requesting a cup of water. Ambulated with walker to room

## 2022-05-04 NOTE — Group Note (Signed)
Group Topic: Overcoming Obstacles  Group Date: 05/04/2022 Start Time: 0800 End Time: 0900 Facilitators: Mervyn Skeeters D, NT  Department: [facility]  Number of Participants: 6  Group Focus: relaxation Treatment Modality:  Psychoeducation Interventions utilized were assignment Purpose: increase insight  Name: [name] Date of Birth: 1955/05/28  MR: [id number]    Level of Participation: moderate Quality of Participation: cooperative Interactions with others: gave feedback Mood/Affect: appropriate Triggers (if applicable): n/a Cognition: coherent/clear Progress: Moderate Response: n/a Plan: follow-up needed  Patients Problems:  Patient Active Problem List   Diagnosis Date Noted   Methamphetamine-induced mood disorder ([place]) 05/03/2022   Methamphetamine use disorder, severe, dependence ([place]) 05/02/2022   Amphetamine and psychostimulant-induced psychosis with hallucinations ([place]) 05/02/2022   Chest pain 04/13/2022   AKI (acute kidney injury) ([place]) 04/13/2022   Elevated troponin 04/13/2022   Lactic acidosis 04/13/2022   Heart failure with reduced ejection fraction ([place]) 04/13/2022   History of pulmonary embolism 04/13/2022   History of pneumonia 04/13/2022   Polysubstance abuse ([place]) 04/13/2022   AAA (abdominal aortic aneurysm) ([place]) 04/13/2022   Bipolar 1 disorder ([place]) 04/07/2022   Community acquired pneumonia of right lower lobe of lung 04/07/2022   Substance induced mood disorder ([place]) 04/07/2022   Suicidal ideation 04/06/2022   Right lower lobe pneumonia 04/06/2022   RLL pneumonia 04/06/2022   Hallucination 03/09/2022   MDD (major depressive disorder) 03/09/2022   Acute pulmonary embolism without acute cor pulmonale (HCC)    Non-ST elevation (NSTEMI) myocardial infarction [facility])    Coronary artery disease involving native coronary artery of native heart without angina pectoris    Cardiac arrest ([place]) 03/02/2020   Severe episode  of recurrent major depressive disorder, without psychotic features ([place])    Opioid use disorder, severe, in sustained remission ([place]) 05/25/2015   Benzodiazepine abuse ([place]) 05/25/2015   Cannabis use disorder, moderate, dependence ([place]) 05/25/2015   MDD (major depressive disorder), recurrent episode, severe ([place]) 05/23/2015   Chronic pain syndrome 05/23/2015   GAD (generalized anxiety disorder) 05/23/2015   CVA (cerebral infarction) 10/15/2013   Hypertension 10/15/2013   Controlled type 2 diabetes mellitus without complication, without long-term current use of insulin ([place]) 10/15/2013

## 2022-05-04 NOTE — ED Notes (Signed)
Outside with nurse

## 2022-05-05 DIAGNOSIS — F1510 Other stimulant abuse, uncomplicated: Secondary | ICD-10-CM | POA: Diagnosis not present

## 2022-05-05 DIAGNOSIS — F1410 Cocaine abuse, uncomplicated: Secondary | ICD-10-CM | POA: Diagnosis not present

## 2022-05-05 DIAGNOSIS — F1594 Other stimulant use, unspecified with stimulant-induced mood disorder: Secondary | ICD-10-CM | POA: Diagnosis not present

## 2022-05-05 LAB — GLUCOSE, CAPILLARY
Glucose-Capillary: 139 mg/dL — ABNORMAL HIGH (ref 70–99)
Glucose-Capillary: 105 mg/dL — ABNORMAL HIGH (ref 70–99)
Glucose-Capillary: 161 mg/dL — ABNORMAL HIGH (ref 70–99)

## 2022-05-05 LAB — HEMOGLOBIN A1C
Hgb A1c MFr Bld: 7.1 % — ABNORMAL HIGH (ref 4.8–5.6)
Mean Plasma Glucose: 157 mg/dL

## 2022-05-05 NOTE — ED Notes (Signed)
Snacks given 

## 2022-05-05 NOTE — ED Provider Notes (Signed)
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 DETECTED Final   Amphetamines 04/13/2022 POSITIVE (A)  NONE DETECTED Final   Tetrahydrocannabinol 04/13/2022 NONE DETECTED  NONE DETECTED Final   Barbiturates 04/13/2022 NONE DETECTED  NONE DETECTED Final   Comment: (NOTE) DRUG SCREEN FOR MEDICAL PURPOSES ONLY.  IF CONFIRMATION IS NEEDED FOR ANY PURPOSE, NOTIFY LAB WITHIN 5 DAYS.  LOWEST DETECTABLE LIMITS FOR URINE DRUG SCREEN Drug Class                     Cutoff (ng/mL) Amphetamine and metabolites    1000 Barbiturate and metabolites    200 Benzodiazepine                 200 Opiates and metabolites        300 Cocaine and metabolites        300 THC                             50 Performed at [facility], [place] [address], [place], [place] [address]    Total CK 04/13/2022 177  49 - 397 U/L Final   Performed at [facility], [place] [address], [place], [place] [address]   Lactic Acid, Venous 04/13/2022 1.8  0.5 - 1.9 mmol/L Final   Performed at [place] [address] [place], [place] [address]   RBC / HPF 04/13/2022 0-5  0 - 5 RBC/hpf Final   WBC, UA 04/13/2022 0-5  0 - 5 WBC/hpf Final   Bacteria, UA 04/13/2022 NONE SEEN  NONE SEEN Final   Squamous Epithelial / HPF 04/13/2022 0-5  0 - 5 /HPF Final   Mucus 04/13/2022 PRESENT   Final   Hyaline Casts, UA 04/13/2022 PRESENT   Final   Performed at [facility], [place] [address], [place], [place] [address]   Glucose-Capillary 04/13/2022 113 (H)  70 - 99 mg/dL Final   Glucose reference range applies only to samples taken after fasting for at least 8 hours.   Glucose-Capillary 04/14/2022 135 (H)  70 - 99 mg/dL Final   Glucose reference range applies only to samples taken after fasting for at least 8 hours.   Sodium 04/14/2022 138  135 - 145 mmol/L Final   Potassium 04/14/2022 3.8  3.5 - 5.1 mmol/L Final   Chloride 04/14/2022 101  98 - 111 mmol/L Final   CO2 04/14/2022 23  22 - 32 mmol/L Final   Glucose, Bld 04/14/2022 163 (H)  70 - 99 mg/dL Final   Glucose reference range applies only to samples taken after fasting  for at least 8 hours.   BUN 04/14/2022 18  8 - 23 mg/dL Final   Creatinine, Ser 04/14/2022 1.12  0.61 - 1.24 mg/dL Final   Calcium 04/14/2022 8.3 (L)  8.9 - 10.3 mg/dL Final   GFR, Estimated 04/14/2022 >60  >60 mL/min Final   Comment: (NOTE) Calculated using the CKD-EPI Creatinine Equation (2021)    Anion gap 04/14/2022 14  5 - 15 Final   Performed at [facility], [place] [address], [place], [place] [address]   Glucose-Capillary 04/14/2022 187 (H)  70 - 99 mg/dL Final   Glucose reference range applies only to samples taken after fasting for at least 8 hours.  Admission  on 04/05/2022, Discharged on 04/08/2022  Component Date Value Ref Range Status   Sodium 04/05/2022 136  135 - 145 mmol/L Final   Potassium 04/05/2022 4.0  3.5 - 5.1 mmol/L Final   Chloride 04/05/2022 100  98 - 111 mmol/L Final   CO2 04/05/2022 26  22 - 32 mmol/L Final   Glucose, Bld 04/05/2022 76  70 - 99 mg/dL Final   Glucose reference range applies only to samples taken after fasting for at least 8 hours.   BUN 04/05/2022 20  8 - 23 mg/dL Final   Creatinine, Ser 04/05/2022 1.31 (H)  0.61 - 1.24 mg/dL Final   Calcium 04/05/2022 8.6 (L)  8.9 - 10.3 mg/dL Final   Total Protein 04/05/2022 7.8  6.5 - 8.1 g/dL Final   Albumin 04/05/2022 3.6  3.5 - 5.0 g/dL Final   AST 04/05/2022 14 (L)  15 - 41 U/L Final   ALT 04/05/2022 8  0 - 44 U/L Final   Alkaline Phosphatase 04/05/2022 93  38 - 126 U/L Final   Total Bilirubin 04/05/2022 0.4  0.3 - 1.2 mg/dL Final   GFR, Estimated 04/05/2022 >60  >60 mL/min Final   Comment: (NOTE) Calculated using the CKD-EPI Creatinine Equation (2021)    Anion gap 04/05/2022 10  5 - 15 Final   Performed at [facility], [place] [address] [place], [place] [address]   Alcohol, Ethyl (B) 04/05/2022 <10  <10 mg/dL Final   Comment: (NOTE) Lowest detectable limit for serum alcohol is 10 mg/dL.  For medical purposes only. Performed at [facility], [place] [address] [place], [place] [address]    WBC 04/05/2022 6.7  4.0 - 10.5 K/uL Final   RBC 04/05/2022 3.96 (L)  4.22 - 5.81 MIL/uL Final   Hemoglobin 04/05/2022 10.9 (L)  13.0 - 17.0 g/dL Final   HCT 04/05/2022 35.2 (L)  39.0 - 52.0 % Final   MCV 04/05/2022 88.9  80.0 - 100.0 fL Final   MCH 04/05/2022 27.5  26.0 - 34.0 pg Final   MCHC 04/05/2022 31.0  30.0 - 36.0 g/dL Final   RDW 04/05/2022 17.0 (H)  11.5 - 15.5 % Final   Platelets 04/05/2022 312  150 - 400 K/uL Final   nRBC 04/05/2022 0.0  0.0 - 0.2 % Final   Neutrophils Relative % 04/05/2022 65  % Final   Neutro Abs 04/05/2022  4.4  1.7 - 7.7 K/uL Final   Lymphocytes Relative 04/05/2022 23  % Final   Lymphs Abs 04/05/2022 1.5  0.7 - 4.0 K/uL Final   Monocytes Relative 04/05/2022 8  % Final   Monocytes Absolute 04/05/2022 0.6  0.1 - 1.0 K/uL Final   Eosinophils Relative 04/05/2022 3  % Final   Eosinophils Absolute 04/05/2022 0.2  0.0 - 0.5  K/uL Final   Basophils Relative 04/05/2022 1  % Final   Basophils Absolute 04/05/2022 0.1  0.0 - 0.1 K/uL Final   Immature Granulocytes 04/05/2022 0  % Final   Abs Immature Granulocytes 04/05/2022 0.01  0.00 - 0.07 K/uL Final   Performed at [facility], [place] [address] [place], [place] [address]   SARS Coronavirus 2 by RT PCR 04/05/2022 NEGATIVE  NEGATIVE Final   Comment: (NOTE) SARS-CoV-2 target nucleic acids are NOT DETECTED.  The SARS-CoV-2 RNA is generally detectable in upper respiratory specimens during the acute phase of infection. The lowest concentration of SARS-CoV-2 viral copies this assay can detect is 138 copies/mL. A negative result does not preclude SARS-Cov-2 infection and should not be used as the sole basis for treatment or other patient management decisions. A negative result may occur with  improper specimen collection/handling, submission of specimen other than nasopharyngeal swab, presence of viral mutation(s) within the areas targeted by this assay, and inadequate number of viral copies(<138 copies/mL). A negative result must be combined with clinical observations, patient history, and epidemiological information. The expected result is Negative.  Fact Sheet for Patients:  EntrepreneurPulse.com.au  Fact Sheet for Healthcare Providers:  IncredibleEmployment.be  This test is no                          t yet approved or cleared by the [place] FDA and  has been authorized for detection and/or diagnosis of SARS-CoV-2 by FDA under an Emergency Use Authorization (EUA). This EUA will remain   in effect (meaning this test can be used) for the duration of the COVID-19 declaration under Section 564(b)(1) of the Act, 21 U.S.C.section 360bbb-3(b)(1), unless the authorization is terminated  or revoked sooner.       Influenza A by PCR 04/05/2022 NEGATIVE  NEGATIVE Final   Influenza B by PCR 04/05/2022 NEGATIVE  NEGATIVE Final   Comment: (NOTE) The Xpert Xpress SARS-CoV-2/FLU/RSV plus assay is intended as an aid in the diagnosis of influenza from Nasopharyngeal swab specimens and should not be used as a sole basis for treatment. Nasal washings and aspirates are unacceptable for Xpert Xpress SARS-CoV-2/FLU/RSV testing.  Fact Sheet for Patients: EntrepreneurPulse.com.au  Fact Sheet for Healthcare Providers: IncredibleEmployment.be  This test is not yet approved or cleared by the [place] FDA and has been authorized for detection and/or diagnosis of SARS-CoV-2 by FDA under an Emergency Use Authorization (EUA). This EUA will remain in effect (meaning this test can be used) for the duration of the COVID-19 declaration under Section 564(b)(1) of the Act, 21 U.S.C. section 360bbb-3(b)(1), unless the authorization is terminated or revoked.     Resp Syncytial Virus by PCR 04/05/2022 NEGATIVE  NEGATIVE Final   Comment: (NOTE) Fact Sheet for Patients: EntrepreneurPulse.com.au  Fact Sheet for Healthcare Providers: IncredibleEmployment.be  This test is not yet approved or cleared by the [place] FDA and has been authorized for detection and/or diagnosis of SARS-CoV-2 by FDA under an Emergency Use Authorization (EUA). This EUA will remain in effect (meaning this test can be used) for the duration of the COVID-19 declaration under Section 564(b)(1) of the Act, 21 U.S.C. section 360bbb-3(b)(1), unless the authorization is terminated or revoked.  Performed at [facility], [place]  [address] [place], [place] [address]    SARS Coronavirus 2 by RT PCR 04/06/2022 NEGATIVE  NEGATIVE Final   Comment: (NOTE) SARS-CoV-2 target nucleic acids are NOT DETECTED.  The SARS-CoV-2  RNA is generally detectable in upper respiratory specimens during the acute phase of infection. The lowest concentration of SARS-CoV-2 viral copies this assay can detect is 138 copies/mL. A negative result does not preclude SARS-Cov-2 infection and should not be used as the sole basis for treatment or other patient management decisions. A negative result may occur with  improper specimen collection/handling, submission of specimen other than nasopharyngeal swab, presence of viral mutation(s) within the areas targeted by this assay, and inadequate number of viral copies(<138 copies/mL). A negative result must be combined with clinical observations, patient history, and epidemiological information. The expected result is Negative.  Fact Sheet for Patients:  EntrepreneurPulse.com.au  Fact Sheet for Healthcare Providers:  IncredibleEmployment.be  This test is no                          t yet approved or cleared by the [place] FDA and  has been authorized for detection and/or diagnosis of SARS-CoV-2 by FDA under an Emergency Use Authorization (EUA). This EUA will remain  in effect (meaning this test can be used) for the duration of the COVID-19 declaration under Section 564(b)(1) of the Act, 21 U.S.C.section 360bbb-3(b)(1), unless the authorization is terminated  or revoked sooner.       Influenza A by PCR 04/06/2022 NEGATIVE  NEGATIVE Final   Influenza B by PCR 04/06/2022 NEGATIVE  NEGATIVE Final   Comment: (NOTE) The Xpert Xpress SARS-CoV-2/FLU/RSV plus assay is intended as an aid in the diagnosis of influenza from Nasopharyngeal swab specimens and should not be used as a sole basis for treatment. Nasal washings and aspirates are unacceptable for  Xpert Xpress SARS-CoV-2/FLU/RSV testing.  Fact Sheet for Patients: EntrepreneurPulse.com.au  Fact Sheet for Healthcare Providers: IncredibleEmployment.be  This test is not yet approved or cleared by the [place] FDA and has been authorized for detection and/or diagnosis of SARS-CoV-2 by FDA under an Emergency Use Authorization (EUA). This EUA will remain in effect (meaning this test can be used) for the duration of the COVID-19 declaration under Section 564(b)(1) of the Act, 21 U.S.C. section 360bbb-3(b)(1), unless the authorization is terminated or revoked.     Resp Syncytial Virus by PCR 04/06/2022 NEGATIVE  NEGATIVE Final   Comment: (NOTE) Fact Sheet for Patients: EntrepreneurPulse.com.au  Fact Sheet for Healthcare Providers: IncredibleEmployment.be  This test is not yet approved or cleared by the [place] FDA and has been authorized for detection and/or diagnosis of SARS-CoV-2 by FDA under an Emergency Use Authorization (EUA). This EUA will remain in effect (meaning this test can be used) for the duration of the COVID-19 declaration under Section 564(b)(1) of the Act, 21 U.S.C. section 360bbb-3(b)(1), unless the authorization is terminated or revoked.  Performed at [facility], [place] [address], [place], [place] [address]    Sodium 04/06/2022 133 (L)  135 - 145 mmol/L Final   Potassium 04/06/2022 4.9  3.5 - 5.1 mmol/L Final   Chloride 04/06/2022 98  98 - 111 mmol/L Final   CO2 04/06/2022 25  22 - 32 mmol/L Final   Glucose, Bld 04/06/2022 146 (H)  70 - 99 mg/dL Final   Glucose reference range applies only to samples taken after fasting for at least 8 hours.   BUN 04/06/2022 29 (H)  8 - 23 mg/dL Final   Creatinine, Ser 04/06/2022 2.12 (H)  0.61 - 1.24 mg/dL Final   Calcium 04/06/2022 8.6 (L)  8.9 - 10.3 mg/dL Final   Total  Protein 04/06/2022 8.1  6.5 - 8.1 g/dL Final    Albumin 04/06/2022 3.3 (L)  3.5 - 5.0 g/dL Final   AST 04/06/2022 17  15 - 41 U/L Final   ALT 04/06/2022 10  0 - 44 U/L Final   Alkaline Phosphatase 04/06/2022 92  38 - 126 U/L Final   Total Bilirubin 04/06/2022 0.7  0.3 - 1.2 mg/dL Final   GFR, Estimated 04/06/2022 34 (L)  >60 mL/min Final   Comment: (NOTE) Calculated using the CKD-EPI Creatinine Equation (2021)    Anion gap 04/06/2022 10  5 - 15 Final   Performed at [facility], [place] [address], [place], [place] [address]   WBC 04/06/2022 11.8 (H)  4.0 - 10.5 K/uL Final   RBC 04/06/2022 4.13 (L)  4.22 - 5.81 MIL/uL Final   Hemoglobin 04/06/2022 11.6 (L)  13.0 - 17.0 g/dL Final   HCT 04/06/2022 37.4 (L)  39.0 - 52.0 % Final   MCV 04/06/2022 90.6  80.0 - 100.0 fL Final   MCH 04/06/2022 28.1  26.0 - 34.0 pg Final   MCHC 04/06/2022 31.0  30.0 - 36.0 g/dL Final   RDW 04/06/2022 17.0 (H)  11.5 - 15.5 % Final   Platelets 04/06/2022 309  150 - 400 K/uL Final   nRBC 04/06/2022 0.0  0.0 - 0.2 % Final   Neutrophils Relative % 04/06/2022 81  % Final   Neutro Abs 04/06/2022 9.5 (H)  1.7 - 7.7 K/uL Final   Lymphocytes Relative 04/06/2022 11  % Final   Lymphs Abs 04/06/2022 1.3  0.7 - 4.0 K/uL Final   Monocytes Relative 04/06/2022 7  % Final   Monocytes Absolute 04/06/2022 0.9  0.1 - 1.0 K/uL Final   Eosinophils Relative 04/06/2022 1  % Final   Eosinophils Absolute 04/06/2022 0.1  0.0 - 0.5 K/uL Final   Basophils Relative 04/06/2022 0  % Final   Basophils Absolute 04/06/2022 0.0  0.0 - 0.1 K/uL Final   Immature Granulocytes 04/06/2022 0  % Final   Abs Immature Granulocytes 04/06/2022 0.03  0.00 - 0.07 K/uL Final   Performed at [facility], [place] [address] [place], [place] [address]   B Natriuretic Peptide 04/06/2022 39.0  0.0 - 100.0 pg/mL Final   Performed at [place] [address], [place], [place] [address]   Troponin I (High Sensitivity) 04/06/2022 13  <18 ng/L Final    Comment: (NOTE) Elevated high sensitivity troponin I (hsTnI) values and significant  changes across serial measurements may suggest ACS but many other  chronic and acute conditions are known to elevate hsTnI results.  Refer to the "Links" section for chest pain algorithms and additional  guidance. Performed at [facility], [place] [address], [place], [place] [address]    Specimen Description 04/07/2022    Final                   Value:BLOOD BLOOD RIGHT ARM Performed at [place] [address] [place], [place] [address]    Special Requests 04/07/2022    Final                   Value:BOTTLES DRAWN AEROBIC ONLY Blood Culture adequate volume Performed at [facility], [place] [address] [place], [place] [address]    Culture 04/07/2022    Final  Value:NO GROWTH 5 DAYS Performed at [facility], [place] [address] [place], [place] [address]    Report Status 04/07/2022 04/12/2022 FINAL   Final   Specimen Description 04/07/2022    Final                   Value:BLOOD BLOOD LEFT ARM Performed at [facility], [place] [address] [place], [place] [address]    Special Requests 04/07/2022    Final                   Value:BOTTLES DRAWN AEROBIC ONLY Blood Culture adequate volume Performed at [facility], [place] [address], [place], [place] [address]    Culture 04/07/2022    Final                   Value:NO GROWTH 5 DAYS Performed at [place] [address] [place], [place] [address]    Report Status 04/07/2022 04/12/2022 FINAL   Final   Lactic Acid, Venous 04/07/2022 1.3  0.5 - 1.9 mmol/L Final   Performed at [place] [address], [place], [place] [address]   HIV Screen 4th Generation wRfx 04/07/2022 Non Reactive  Non Reactive Final   Performed at [facility], [place] [address], [place], [place] [address]   WBC 04/07/2022 8.4  4.0 - 10.5 K/uL  Final   RBC 04/07/2022 3.69 (L)  4.22 - 5.81 MIL/uL Final   Hemoglobin 04/07/2022 10.3 (L)  13.0 - 17.0 g/dL Final   HCT 04/07/2022 34.3 (L)  39.0 - 52.0 % Final   MCV 04/07/2022 93.0  80.0 - 100.0 fL Final   MCH 04/07/2022 27.9  26.0 - 34.0 pg Final   MCHC 04/07/2022 30.0  30.0 - 36.0 g/dL Final   RDW 04/07/2022 17.2 (H)  11.5 - 15.5 % Final   Platelets 04/07/2022 279  150 - 400 K/uL Final   nRBC 04/07/2022 0.0  0.0 - 0.2 % Final   Performed at [facility], [place] [address], [place], [place] [address]   Creatinine, Ser 04/07/2022 1.80 (H)  0.61 - 1.24 mg/dL Final   GFR, Estimated 04/07/2022 42 (L)  >60 mL/min Final   Comment: (NOTE) Calculated using the CKD-EPI Creatinine Equation (2021) Performed at [facility], [place] [address], [place], [place] [address]    Sodium 04/07/2022 134 (L)  135 - 145 mmol/L Final   Potassium 04/07/2022 4.5  3.5 - 5.1 mmol/L Final   Chloride 04/07/2022 100  98 - 111 mmol/L Final   CO2 04/07/2022 25  22 - 32 mmol/L Final   Glucose, Bld 04/07/2022 159 (H)  70 - 99 mg/dL Final   Glucose reference range applies only to samples taken after fasting for at least 8 hours.   BUN 04/07/2022 31 (H)  8 - 23 mg/dL Final   Creatinine, Ser 04/07/2022 1.50 (H)  0.61 - 1.24 mg/dL Final   Calcium 04/07/2022 8.4 (L)  8.9 - 10.3 mg/dL Final   GFR, Estimated 04/07/2022 52 (L)  >60 mL/min Final   Comment: (NOTE) Calculated using the CKD-EPI Creatinine Equation (2021)    Anion gap 04/07/2022 9  5 - 15 Final   Performed at [facility], [place] [address], [place], [place] [address]   WBC 04/07/2022 6.8  4.0 - 10.5 K/uL Final   RBC 04/07/2022 3.57 (L)  4.22 - 5.81 MIL/uL Final   Hemoglobin 04/07/2022  9.9 (L)  13.0 - 17.0 g/dL Final   HCT 04/07/2022 32.3 (L)  39.0 - 52.0 % Final   MCV 04/07/2022 90.5  80.0 - 100.0 fL Final   MCH 04/07/2022 27.7  26.0 - 34.0 pg Final   MCHC 04/07/2022 30.7  30.0 - 36.0 g/dL Final   RDW  04/07/2022 16.9 (H)  11.5 - 15.5 % Final   Platelets 04/07/2022 264  150 - 400 K/uL Final   nRBC 04/07/2022 0.0  0.0 - 0.2 % Final   Neutrophils Relative % 04/07/2022 91  % Final   Neutro Abs 04/07/2022 6.2  1.7 - 7.7 K/uL Final   Lymphocytes Relative 04/07/2022 7  % Final   Lymphs Abs 04/07/2022 0.5 (L)  0.7 - 4.0 K/uL Final   Monocytes Relative 04/07/2022 2  % Final   Monocytes Absolute 04/07/2022 0.1  0.1 - 1.0 K/uL Final   Eosinophils Relative 04/07/2022 0  % Final   Eosinophils Absolute 04/07/2022 0.0  0.0 - 0.5 K/uL Final   Basophils Relative 04/07/2022 0  % Final   Basophils Absolute 04/07/2022 0.0  0.0 - 0.1 K/uL Final   Immature Granulocytes 04/07/2022 0  % Final   Abs Immature Granulocytes 04/07/2022 0.03  0.00 - 0.07 K/uL Final   Performed at [facility], [place] [address] [place], [place] [address]   Total CK 04/07/2022 164  49 - 397 U/L Final   Performed at [facility], [place] [address] [place], [place] [address]   Hgb A1c MFr Bld 04/07/2022 6.3 (H)  4.8 - 5.6 % Final   Comment: (NOTE) Pre diabetes:          5.7%-6.4%  Diabetes:              >6.4%  Glycemic control for   <7.0% adults with diabetes    Mean Plasma Glucose 04/07/2022 134.11  mg/dL Final   Performed at [facility], [place] [address], [place], [place] [address]   Glucose-Capillary 04/07/2022 156 (H)  70 - 99 mg/dL Final   Glucose reference range applies only to samples taken after fasting for at least 8 hours.   Glucose-Capillary 04/07/2022 168 (H)  70 - 99 mg/dL Final   Glucose reference range applies only to samples taken after fasting for at least 8 hours.   Glucose-Capillary 04/07/2022 148 (H)  70 - 99 mg/dL Final   Glucose reference range applies only to samples taken after fasting for at least 8 hours.   WBC 04/08/2022 6.2  4.0 - 10.5 K/uL Final   RBC 04/08/2022 3.57 (L)  4.22 - 5.81 MIL/uL Final   Hemoglobin 04/08/2022 9.9 (L)  13.0 - 17.0 g/dL Final   HCT  04/08/2022 31.7 (L)  39.0 - 52.0 % Final   MCV 04/08/2022 88.8  80.0 - 100.0 fL Final   MCH 04/08/2022 27.7  26.0 - 34.0 pg Final   MCHC 04/08/2022 31.2  30.0 - 36.0 g/dL Final   RDW 04/08/2022 16.6 (H)  11.5 - 15.5 % Final   Platelets 04/08/2022 270  150 - 400 K/uL Final   nRBC 04/08/2022 0.0  0.0 - 0.2 % Final   Performed at [facility], [place] [address] [place], [place] [address]   Sodium 04/08/2022 134 (L)  135 - 145 mmol/L Final   Potassium 04/08/2022 4.0  3.5 - 5.1 mmol/L Final   Chloride 04/08/2022 98  98 - 111 mmol/L Final   CO2 04/08/2022 26  22 -  32 mmol/L Final   Glucose, Bld 04/08/2022 115 (H)  70 - 99 mg/dL Final   Glucose reference range applies only to samples taken after fasting for at least 8 hours.   BUN 04/08/2022 29 (H)  8 - 23 mg/dL Final   Creatinine, Ser 04/08/2022 1.06  0.61 - 1.24 mg/dL Final   Calcium 04/08/2022 8.3 (L)  8.9 - 10.3 mg/dL Final   GFR, Estimated 04/08/2022 >60  >60 mL/min Final   Comment: (NOTE) Calculated using the CKD-EPI Creatinine Equation (2021)    Anion gap 04/08/2022 10  5 - 15 Final   Performed at [facility], [place] [address], [place], [place] [address]   Glucose-Capillary 04/07/2022 183 (H)  70 - 99 mg/dL Final   Glucose reference range applies only to samples taken after fasting for at least 8 hours.   Comment 1 04/07/2022 Notify RN   Final   Comment 2 04/07/2022 Document in Chart   Final   Glucose-Capillary 04/08/2022 117 (H)  70 - 99 mg/dL Final   Glucose reference range applies only to samples taken after fasting for at least 8 hours.  Admission on 03/09/2022, Discharged on 03/09/2022  Component Date Value Ref Range Status   Sodium 03/09/2022 134 (L)  135 - 145 mmol/L Final   Potassium 03/09/2022 4.0  3.5 - 5.1 mmol/L Final   Chloride 03/09/2022 98  98 - 111 mmol/L Final   CO2 03/09/2022 27  22 - 32 mmol/L Final   Glucose, Bld 03/09/2022 151 (H)  70 - 99 mg/dL Final   Glucose reference  range applies only to samples taken after fasting for at least 8 hours.   BUN 03/09/2022 13  8 - 23 mg/dL Final   Creatinine, Ser 03/09/2022 1.52 (H)  0.61 - 1.24 mg/dL Final   Calcium 03/09/2022 9.1  8.9 - 10.3 mg/dL Final   Total Protein 03/09/2022 7.3  6.5 - 8.1 g/dL Final   Albumin 03/09/2022 3.3 (L)  3.5 - 5.0 g/dL Final   AST 03/09/2022 17  15 - 41 U/L Final   ALT 03/09/2022 11  0 - 44 U/L Final   Alkaline Phosphatase 03/09/2022 105  38 - 126 U/L Final   Total Bilirubin 03/09/2022 0.5  0.3 - 1.2 mg/dL Final   GFR, Estimated 03/09/2022 51 (L)  >60 mL/min Final   Comment: (NOTE) Calculated using the CKD-EPI Creatinine Equation (2021)    Anion gap 03/09/2022 9  5 - 15 Final   Performed at [place] [address] [place], [place] [address]   WBC 03/09/2022 8.2  4.0 - 10.5 K/uL Final   RBC 03/09/2022 4.26  4.22 - 5.81 MIL/uL Final   Hemoglobin 03/09/2022 11.5 (L)  13.0 - 17.0 g/dL Final   HCT 03/09/2022 37.0 (L)  39.0 - 52.0 % Final   MCV 03/09/2022 86.9  80.0 - 100.0 fL Final   MCH 03/09/2022 27.0  26.0 - 34.0 pg Final   MCHC 03/09/2022 31.1  30.0 - 36.0 g/dL Final   RDW 03/09/2022 17.1 (H)  11.5 - 15.5 % Final   Platelets 03/09/2022 350  150 - 400 K/uL Final   nRBC 03/09/2022 0.0  0.0 - 0.2 % Final   Performed at [facility], [place] [address] [place], [place] [address]   Glucose-Capillary 03/09/2022 137 (H)  70 - 99 mg/dL Final   Glucose reference range applies only to samples taken after fasting for at least 8 hours.  Admission on  01/10/2022, Discharged on 01/10/2022  Component Date Value Ref Range Status   Glucose-Capillary 01/10/2022 213 (H)  70 - 99 mg/dL Final   Glucose reference range applies only to samples taken after fasting for at least 8 hours.   Sodium 01/10/2022 133 (L)  135 - 145 mmol/L Final   Potassium 01/10/2022 4.0  3.5 - 5.1 mmol/L Final   Chloride 01/10/2022 102  98 - 111 mmol/L Final   CO2 01/10/2022 20 (L)  22 - 32 mmol/L Final    Glucose, Bld 01/10/2022 228 (H)  70 - 99 mg/dL Final   Glucose reference range applies only to samples taken after fasting for at least 8 hours.   BUN 01/10/2022 20  8 - 23 mg/dL Final   Creatinine, Ser 01/10/2022 2.41 (H)  0.61 - 1.24 mg/dL Final   Calcium 01/10/2022 8.8 (L)  8.9 - 10.3 mg/dL Final   Total Protein 01/10/2022 7.1  6.5 - 8.1 g/dL Final   Albumin 01/10/2022 3.3 (L)  3.5 - 5.0 g/dL Final   AST 01/10/2022 20  15 - 41 U/L Final   ALT 01/10/2022 8  0 - 44 U/L Final   Alkaline Phosphatase 01/10/2022 79  38 - 126 U/L Final   Total Bilirubin 01/10/2022 0.3  0.3 - 1.2 mg/dL Final   GFR, Estimated 01/10/2022 29 (L)  >60 mL/min Final   Comment: (NOTE) Calculated using the CKD-EPI Creatinine Equation (2021)    Anion gap 01/10/2022 11  5 - 15 Final   Performed at [facility] [address] [place], [place] [address]   WBC 01/10/2022 9.0  4.0 - 10.5 K/uL Final   RBC 01/10/2022 3.84 (L)  4.22 - 5.81 MIL/uL Final   Hemoglobin 01/10/2022 10.5 (L)  13.0 - 17.0 g/dL Final   HCT 01/10/2022 34.3 (L)  39.0 - 52.0 % Final   MCV 01/10/2022 89.3  80.0 - 100.0 fL Final   MCH 01/10/2022 27.3  26.0 - 34.0 pg Final   MCHC 01/10/2022 30.6  30.0 - 36.0 g/dL Final   RDW 01/10/2022 15.8 (H)  11.5 - 15.5 % Final   Platelets 01/10/2022 379  150 - 400 K/uL Final   nRBC 01/10/2022 0.0  0.0 - 0.2 % Final   Neutrophils Relative % 01/10/2022 78  % Final   Neutro Abs 01/10/2022 6.9  1.7 - 7.7 K/uL Final   Lymphocytes Relative 01/10/2022 15  % Final   Lymphs Abs 01/10/2022 1.3  0.7 - 4.0 K/uL Final   Monocytes Relative 01/10/2022 6  % Final   Monocytes Absolute 01/10/2022 0.6  0.1 - 1.0 K/uL Final   Eosinophils Relative 01/10/2022 1  % Final   Eosinophils Absolute 01/10/2022 0.1  0.0 - 0.5 K/uL Final   Basophils Relative 01/10/2022 0  % Final   Basophils Absolute 01/10/2022 0.0  0.0 - 0.1 K/uL Final   Immature Granulocytes 01/10/2022 0  % Final   Abs Immature Granulocytes 01/10/2022 0.02  0.00 -  0.07 K/uL Final   Performed at [facility], [place] [address] [place], [place] [address]   Lactic Acid, Venous 01/10/2022 3.7 (HH)  0.5 - 1.9 mmol/L Final   Comment: CRITICAL RESULT CALLED TO, READ BACK BY AND VERIFIED WITH C.ROE,RN. [id number] 01/10/22.PAIT,L Performed at [facility], [place] [address], [place], [place] [address]    Prothrombin Time 01/10/2022 15.3 (H)  11.4 - 15.2 seconds Final   INR 01/10/2022 1.2  0.8 - 1.2 Final   Comment: (  NOTE) INR goal varies based on device and disease states. Performed at [facility], [place] [address] [place], [place] [address]    Specimen Description 01/10/2022 BLOOD RIGHT ANTECUBITAL   Final   Special Requests 01/10/2022 BOTTLES DRAWN AEROBIC AND ANAEROBIC Blood Culture adequate volume   Final   Culture 01/10/2022    Final                   Value:NO GROWTH 5 DAYS Performed at [facility], [place] [address] [place], [place] [address]    Report Status 01/10/2022 01/15/2022 FINAL   Final   Specimen Description 01/10/2022 BLOOD SITE NOT SPECIFIED   Final   Special Requests 01/10/2022 BOTTLES DRAWN AEROBIC AND ANAEROBIC Blood Culture results may not be optimal due to an excessive volume of blood received in culture bottles   Final   Culture 01/10/2022    Final                   Value:NO GROWTH 5 DAYS Performed at [facility], [place] [address] [place], [place] [address]    Report Status 01/10/2022 01/15/2022 FINAL   Final   Lactic Acid, Venous 01/10/2022 2.4 (HH)  0.5 - 1.9 mmol/L Final   Comment: CRITICAL VALUE NOTED. VALUE IS CONSISTENT WITH PREVIOUSLY REPORTED/CALLED VALUE Performed at [facility], [place] [address] [place], [place] [address]     Blood Alcohol level:  Lab Results  Component Value Date   [facility] <10 05/01/2022   ETH <10 [id number]    Metabolic Disorder Labs: Lab Results  Component Value Date   HGBA1C 7.1 (H) 05/03/2022   MPG 157 05/03/2022   MPG 134.11 04/07/2022   Lab Results  Component  Value Date   PROLACTIN 21.0 (H) 05/26/2015   Lab Results  Component Value Date   CHOL 143 05/03/2022   TRIG 221 (H) 05/03/2022   HDL 35 (L) 05/03/2022   CHOLHDL 4.1 05/03/2022   VLDL 44 (H) 05/03/2022   LDLCALC 64 05/03/2022   LDLCALC 81 03/06/2020    Therapeutic Lab Levels: No results found for: "LITHIUM" No results found for: "VALPROATE" Lab Results  Component Value Date   CBMZ 8.9 05/28/2015    Physical Findings   AIMS    Flowsheet Row Admission (Discharged) from 05/23/2015 in [facility]  AIMS Total Score 0      AUDIT    Flowsheet Row Admission (Discharged) from 05/23/2015 in [facility]  Alcohol Use Disorder Identification Test Final Score (AUDIT) 0      PHQ2-9    Flowsheet Row ED from 05/03/2022 in [facility] ED from 05/01/2022 in [facility] at [facility]  PHQ-2 Total Score 6 5  PHQ-9 Total Score 22 [facility] from 05/03/2022 in [facility] ED from 05/01/2022 in [facility] Emergency Department at [facility] ED to Hosp-Admission (Discharged) from 04/13/2022 in [facility] No Risk Error: Q3, 4, or 5 should not be populated when Q2 is No No Risk        Musculoskeletal  Strength & Muscle Tone: within normal limits Gait & Station: unsteady Patient leans: N/A  Psychiatric Specialty Exam  Presentation  General Appearance:  Disheveled  Eye Contact: Good  Speech: Clear and Coherent; Normal Rate  Speech Volume: Normal  Handedness: Right   Mood and Affect  Mood: Anxious  Affect: Congruent   Thought Process  Thought Processes: Coherent  Descriptions of Associations:Intact  Orientation:Full (Time, Place and Person)  Thought Content:Logical  Diagnosis of Schizophrenia or Schizoaffective disorder in past: No  Duration of  Psychotic Symptoms: Less than six months   Hallucinations:Hallucinations: Auditory Description of Auditory Hallucinations: hears voices but states they are improved and not intrusive  Ideas of Reference:Paranoia  Suicidal Thoughts:Suicidal Thoughts: No  Homicidal Thoughts:Homicidal Thoughts: No   Sensorium  Memory: Immediate Fair; Recent Fair; Remote Fair  Judgment: Fair  Insight: Fair   Community education officer  Concentration: Good  Attention Span: Good  Recall: Good  Fund of Knowledge: Good  Language: Good   Psychomotor Activity  Psychomotor Activity: Psychomotor Activity: Normal   Assets  Assets: Desire for Improvement; Housing; Physical Health; Resilience; Social Support; Leisure Time   Sleep  Sleep: Sleep: Good   No data recorded  Physical Exam  Physical Exam Vitals and nursing note reviewed.  Constitutional:      General: He is not in acute distress.    Appearance: He is well-developed.  HENT:     Head: Normocephalic and atraumatic.  Eyes:     General:        Right eye: No discharge.        Left eye: No discharge.  Cardiovascular:     Rate and Rhythm: Normal rate.  Pulmonary:     Effort: Pulmonary effort is normal. No respiratory distress.  Musculoskeletal:        General: No swelling. Normal range of motion.     Cervical back: Normal range of motion.  Neurological:     Mental Status: He is alert and oriented to person, place, and time.  Psychiatric:        Attention and Perception: Attention normal. He perceives auditory hallucinations.        Mood and Affect: Mood is anxious and depressed.        Speech: Speech normal.        Behavior: Behavior normal. Behavior is cooperative.        Thought Content: Thought content is paranoid.        Cognition and Memory: Cognition normal.        Judgment: Judgment normal.    Review of Systems  Constitutional: Negative.   HENT: Negative.    Eyes: Negative.   Respiratory: Negative.     Cardiovascular: Negative.   Musculoskeletal: Negative.   Skin: Negative.   Neurological: Negative.   Psychiatric/Behavioral:  Positive for depression, hallucinations and substance abuse. The patient is nervous/anxious.    Blood pressure (!) 164/84, pulse 92, temperature 97.8 F (36.6 C), temperature source Oral, resp. rate 17, SpO2 99 %. There is no height or weight on file to calculate BMI.  Treatment Plan Summary:  Disposition: Ongoing.  Patient will be reevaluated by psychiatry in the a.m. and possibly discharged on 05/06/2022.  Patient has ACTT services in place with psychotherapeutic interventions.  Will continue to have daily contact with patient to assess and evaluate symptoms and progress in treatment and Medication management  Revonda Humphrey, NP 05/05/2022 12:49 PM

## 2022-05-05 NOTE — ED Notes (Signed)
Pt is in the bed sleeping. Respirations are even and unlabored. No acute distress noted. Will continue to monitor for safety. 

## 2022-05-05 NOTE — ED Notes (Signed)
Pt sleeping in bed. RR even and unlabored. No noted distress. Will continue to monitor for safety

## 2022-05-05 NOTE — Discharge Instructions (Signed)
[facility] Address: [facility], [address] [place], [place] [address] Hours: Open ? Closes 4?PM Phone: [phone]  [facility] [place], [place], [address] [phone] phone  New Patient Assessment/Therapy Walk-Ins:  Monday and Wednesday: 8 am until slots are full. Every 1st and 2nd Fridays of the month: 1 pm - 5 pm.  NO ASSESSMENT/THERAPY WALK-INS ON [place]  New Patient Assessment/Medication Management Walk-Ins:  Monday - Friday:  8 am - 11 am.  For all walk-ins, we ask that you arrive by 7:30 am because patients will be seen in the order of arrival.  Availability is limited; therefore, you may not be seen on the same day that you walk-in.  Our goal is to serve and meet the needs of our community to the best of our ability.  SUBSTANCE USE TREATMENT for Medicaid and State Funded/IPRS  [facility] [place], [place], [address] [phone] phone NOTE: [facility] is no longer offering IOP services.  Serves those who are low-income or have no insurance.  [facility] [address], [place], [place], [address] [phone] phone [phone] fax NOTE: Does have Substance Abuse-Intensive Outpatient Program [facility]) as well as transitional housing if eligible.  [place], [place], [address] [phone] phone [phone] fax  [place] [phone] [place], [place], [address] [phone] phone [phone] fax  HALFWAY HOUSES:  [facility] [place] [phone]  [employer].oxford[employer]ancies.com  12 STEP PROGRAMS:  Alcoholics Anonymous of [place] ReportZoo.com.cy  Narcotics Anonymous of [place] GreenScrapbooking.dk  Al-Anon of [employer], [place] www.[place]alanon.org/find-meetings.html  Nar-Anon https://nar-anon.org/find-a-meetin

## 2022-05-05 NOTE — ED Notes (Signed)
Pt sleeping in bed. RR even and unlabored. Will continue to monitor for safety

## 2022-05-05 NOTE — ED Notes (Signed)
Patient was awake to eat breakfast.  He is able to ambulate with walker however he is disheveled and malodorous and needs assistance and prompts to complete ADL's.  Patient can be mildly irritable and entitled at times.  He has limited insight into substance use issues and mental health concerns.  Will monitor and provide safe environment.

## 2022-05-05 NOTE — ED Notes (Signed)
Pt is in the bed resting. Respirations are even and unlabored. No acute distress noted. Will continue to monitor for safety  

## 2022-05-05 NOTE — Tx Team (Signed)
LCSW met with patient to assess current mood, affect, physical state, and inquire about needs/goals while here in [facility] and after discharge. Patient reports he presented due to hearing voices telling him they were going to harm him and crystal meth use. Patient reports he has been hearing the voices for the last 4 months. Patient reports he hears the voices even when he is sober. Patient reports the last time he heard the voices was 3 days ago. Patient reports he has been smoking about 2 grams of crystal meth per day, however reports over the last 3 weeks he stopped smoking it and has been been snorting about a half gram per day. Patient reports he also smokes marijuana about 3 times a week. Patient denies any recent admissions for substance use or mental health issues. Patient reports his last admission was a couple of years ago and could not recall the name of agency. Patient reports he is currently being followed by an ACT team with [facility] and reports he has been with the agency for many years. Patient reports his goal at this time is to get rid of the voices and stop using crystal meth. Patient reports "I am 100% sure I will no longer use crystal meth as I have never heard voices before and I don't want to keep hearing them". Patient reports once stable for discharge, he would like to discharge back to his home with resumption of care provided by is ACT team. Patient provided LCSW with permission to follow up and gain collateral. Patient currently denies any legal charges or upcoming court dates. Patient reports he lives at home alone and receives about $1000 in disability from his payee. Patient reports he does not have access to his own transportation, and reports he would identify his ACT team as his support system. Patient aware that LCSW will follow up with ACT team for collateral and will follow up to provide updates as received. Patient expressed understanding and  appreciation of LCSW assistance. No other needs were reported at this time by patient.   LCSW attempted to get in contact with patient's ACT Team [phone] however received no answer. LCSW left non emergency voice message requesting phone call back. No patient identifying information was provided. LCSW will attempt to follow up at a later time.   Lucius Conn, LCSW Clinical Social Worker [facility] Ph: [phone]

## 2022-05-06 DIAGNOSIS — F1410 Cocaine abuse, uncomplicated: Secondary | ICD-10-CM | POA: Diagnosis not present

## 2022-05-06 DIAGNOSIS — F1510 Other stimulant abuse, uncomplicated: Secondary | ICD-10-CM | POA: Diagnosis not present

## 2022-05-06 DIAGNOSIS — F1594 Other stimulant use, unspecified with stimulant-induced mood disorder: Secondary | ICD-10-CM | POA: Diagnosis not present

## 2022-05-06 LAB — COMPREHENSIVE METABOLIC PANEL
ALT: 8 U/L (ref 0–44)
AST: 14 U/L — ABNORMAL LOW (ref 15–41)
Albumin: 3 g/dL — ABNORMAL LOW (ref 3.5–5.0)
Alkaline Phosphatase: 114 U/L (ref 38–126)
Anion gap: 10 (ref 5–15)
BUN: 17 mg/dL (ref 8–23)
CO2: 26 mmol/L (ref 22–32)
Calcium: 8.7 mg/dL — ABNORMAL LOW (ref 8.9–10.3)
Chloride: 100 mmol/L (ref 98–111)
Creatinine, Ser: 1.07 mg/dL (ref 0.61–1.24)
GFR, Estimated: >60 mL/min (ref >60)
Glucose, Bld: 109 mg/dL — ABNORMAL HIGH (ref 70–99)
Potassium: 4.4 mmol/L (ref 3.5–5.1)
Sodium: 136 mmol/L (ref 135–145)
Total Bilirubin: 0.2 mg/dL — ABNORMAL LOW (ref 0.3–1.2)
Total Protein: 6.7 g/dL (ref 6.5–8.1)

## 2022-05-06 LAB — GLUCOSE, CAPILLARY: Glucose-Capillary: 108 mg/dL — ABNORMAL HIGH (ref 70–99)

## 2022-05-06 MED ORDER — ALBUTEROL SULFATE HFA 108 (90 BASE) MCG/ACT IN AERS: 2.0000 | INHALATION_SPRAY | RESPIRATORY_TRACT | Status: AC | PRN

## 2022-05-06 MED ORDER — FAMOTIDINE 20 MG PO TABS: ORAL_TABLET | ORAL | 0 refills | Status: AC

## 2022-05-06 MED ORDER — NICOTINE 21 MG/24HR TD PT24: 21.0000 mg | MEDICATED_PATCH | Freq: Every day | TRANSDERMAL | 0 refills | Status: AC

## 2022-05-06 NOTE — ED Notes (Signed)
Pt sleeping in no acute distress. RR even and unlabored. Environment secured. Will continue to monitor for safety. 

## 2022-05-06 NOTE — ED Provider Notes (Signed)
[facility]BC/OBS ASAP Discharge Summary  Date and Time: 05/06/2022 1:18 PM  Name: [name]  MRN:  [id number]   Discharge Diagnoses:  Final diagnoses:  Methamphetamine abuse ([place])  Severe episode of recurrent major depressive disorder, with psychotic features ([place])    Subjective:  The patient is a 67 year old male with a history of chronic auditory hallucinations and methamphetamine use.  He receives ACT services from [facility].  The patient presented with auditory hallucinations thought to be secondary to meth use.  Stay Summary:  The patient was noted to have a stated interest in stopping methamphetamine use.  He reported that he wanted to discharge back home.  He declined residential rehab or outpatient CD IOP.  He states that he intends to maintain sobriety.  LCSW arranged for the patient's ACT team to pick him up today.  Patient reports his ACT team is supportive and appreciates them.  The patient denies auditory/visual hallucinations.  The patient reports good mood, appetite, and sleep. They deny suicidal and homicidal thoughts. The patient denies side effects from their medications.  Review of systems as below. The patient denies experiencing any withdrawal symptoms.    Total Time spent with patient: 20 minutes  Past Psychiatric History: as above Past Medical History: as above Family History: none Family Psychiatric History: none Social History: as above Tobacco Cessation:  A prescription for an FDA-approved tobacco cessation medication provided at discharge   Current Medications:  Current Facility-Administered Medications  Medication Dose Route Frequency Provider Last Rate Last Admin   acetaminophen (TYLENOL) tablet 650 mg  650 mg Oral Q6H PRN Bobbitt, Shalon E, NP       albuterol (VENTOLIN HFA) 108 (90 Base) MCG/ACT inhaler 2 puff  2 puff Inhalation Q4H PRN Bobbitt, Shalon E, NP       alum & mag hydroxide-simeth (MAALOX/MYLANTA) 200-200-20 MG/5ML suspension 30 mL  30 mL Oral Q4H  PRN Bobbitt, Shalon E, NP       apixaban (ELIQUIS) tablet 5 mg  5 mg Oral BID Bobbitt, Shalon E, NP   5 mg at 05/06/22 0923   carvedilol (COREG) tablet 6.25 mg  6.25 mg Oral BID WC Bobbitt, Shalon E, NP   6.25 mg at 05/06/22 0830   clopidogrel (PLAVIX) tablet 75 mg  75 mg Oral Daily Bobbitt, Shalon E, NP   75 mg at 05/06/22 [id number]   dicyclomine (BENTYL) tablet 20 mg  20 mg Oral Q6H PRN Bobbitt, Shalon E, NP       famotidine (PEPCID) tablet 20 mg  20 mg Oral Daily PRN Bobbitt, Shalon E, NP       gabapentin (NEURONTIN) capsule 1,200 mg  1,200 mg Oral TID Bobbitt, Shalon E, NP   1,200 mg at 05/06/22 0923   glipiZIDE (GLUCOTROL XL) 24 hr tablet 10 mg  10 mg Oral Daily Bobbitt, Shalon E, NP   10 mg at 05/06/22 [id number]   hydrOXYzine (ATARAX) tablet 25 mg  25 mg Oral Q6H PRN Bobbitt, Shalon E, NP       loperamide (IMODIUM) capsule 2-4 mg  2-4 mg Oral PRN Bobbitt, Shalon E, NP       magnesium hydroxide (MILK OF MAGNESIA) suspension 30 mL  30 mL Oral Daily PRN Bobbitt, Shalon E, NP       metFORMIN (GLUCOPHAGE) tablet 1,000 mg  1,000 mg Oral BID WC Bobbitt, Shalon E, NP   1,000 mg at 05/06/22 0830   methocarbamol (ROBAXIN) tablet 500 mg  500 mg Oral Q8H PRN Bobbitt, Shalon E,  NP   500 mg at 05/06/22 0923   naproxen (NAPROSYN) tablet 500 mg  500 mg Oral BID PRN Bobbitt, Shalon E, NP       nicotine (NICODERM CQ - dosed in mg/24 hours) patch 21 mg  21 mg Transdermal Daily Bobbitt, Shalon E, NP   21 mg at 05/06/22 0924   ondansetron (ZOFRAN-ODT) disintegrating tablet 4 mg  4 mg Oral Q6H PRN Bobbitt, Shalon E, NP       rosuvastatin (CRESTOR) tablet 20 mg  20 mg Oral QHS Bobbitt, Shalon E, NP   20 mg at 05/05/22 2145   senna-docusate (Senokot-S) tablet 2 tablet  2 tablet Oral Daily PRN Bobbitt, Shalon E, NP   2 tablet at 05/04/22 2120   sertraline (ZOLOFT) tablet 75 mg  75 mg Oral Daily Bobbitt, Shalon E, NP   75 mg at 05/06/22 0923   traMADol (ULTRAM) tablet 50 mg  50 mg Oral Q6H PRN Bobbitt, Shalon E, NP   50 mg at  05/06/22 [id number]   traZODone (DESYREL) tablet 50 mg  50 mg Oral QHS Bobbitt, Shalon E, NP   50 mg at 05/05/22 2145   Current Outpatient Medications  Medication Sig Dispense Refill   apixaban (ELIQUIS) 5 MG TABS tablet Take 1 tablet (5 mg total) by mouth 2 (two) times daily.     carvedilol (COREG) 6.25 MG tablet TAKE 1 TABLET (6.25 MG TOTAL) BY MOUTH TWO TIMES DAILY WITH A MEAL. (Patient taking differently: Take 6.25 mg by mouth 2 (two) times daily with a meal.) 60 tablet 0   clopidogrel (PLAVIX) 75 MG tablet Take 75 mg by mouth daily.     Dulaglutide (TRULICITY) 1.5 0000000 SOPN Inject 1.5 mg into the skin once a week. Friday     gabapentin (NEURONTIN) 600 MG tablet Take 1,200 mg by mouth 3 (three) times daily.     glipiZIDE (GLUCOTROL XL) 10 MG 24 hr tablet TAKE 1 TABLET (10 MG TOTAL) BY MOUTH DAILY. (Patient taking differently: Take 10 mg by mouth daily.) 30 tablet 0   metFORMIN (GLUCOPHAGE) 1000 MG tablet Take 1,000 mg by mouth 2 (two) times daily with a meal.     polyethylene glycol (MIRALAX / GLYCOLAX) 17 g packet Take 17 g by mouth daily as needed for mild constipation.     rosuvastatin (CRESTOR) 20 MG tablet Take 20 mg by mouth at bedtime.     sacubitril-valsartan (ENTRESTO) 24-26 MG Take 1 tablet by mouth 2 (two) times daily.     senna-docusate (SENOKOT-S) 8.6-50 MG tablet Take 2 tablets by mouth daily as needed for mild constipation.     sertraline (ZOLOFT) 50 MG tablet Take 75 mg by mouth daily. Taking 1 & 1/2 tabs daily = 75 mg 7 DS delivered on 04-01-22     thiamine (VITAMIN B-1) 100 MG tablet Take 100 mg by mouth daily.     tiZANidine (ZANAFLEX) 4 MG capsule Take 4 mg by mouth 2 (two) times daily as needed for muscle spasms.     traMADol (ULTRAM) 50 MG tablet Take 50 mg by mouth every 6 (six) hours as needed for moderate pain.     traZODone (DESYREL) 50 MG tablet Take 50 mg by mouth at bedtime.     albuterol (VENTOLIN HFA) 108 (90 Base) MCG/ACT inhaler Inhale 2 puffs into the lungs  every 4 (four) hours as needed for wheezing or shortness of breath.     blood glucose meter kit and supplies KIT Dispense based on patient  and insurance preference. Use up to four times daily as directed. (FOR ICD-9 250.00, 250.01). 1 each 0   famotidine (PEPCID) 20 MG tablet TAKE 1 TABLET (20 MG TOTAL) BY MOUTH TWO TIMES DAILY. 60 tablet 0   [START ON 05/07/2022] nicotine (NICODERM CQ - DOSED IN MG/24 HOURS) 21 mg/24hr patch Place 1 patch (21 mg total) onto the skin daily. 28 patch 0    PTA Medications:  Facility Ordered Medications  Medication   metFORMIN (GLUCOPHAGE) tablet 1,000 mg   sertraline (ZOLOFT) tablet 75 mg   famotidine (PEPCID) tablet 20 mg   clopidogrel (PLAVIX) tablet 75 mg   carvedilol (COREG) tablet 6.25 mg   albuterol (VENTOLIN HFA) 108 (90 Base) MCG/ACT inhaler 2 puff   apixaban (ELIQUIS) tablet 5 mg   gabapentin (NEURONTIN) capsule 1,200 mg   rosuvastatin (CRESTOR) tablet 20 mg   senna-docusate (Senokot-S) tablet 2 tablet   dicyclomine (BENTYL) tablet 20 mg   hydrOXYzine (ATARAX) tablet 25 mg   loperamide (IMODIUM) capsule 2-4 mg   methocarbamol (ROBAXIN) tablet 500 mg   naproxen (NAPROSYN) tablet 500 mg   ondansetron (ZOFRAN-ODT) disintegrating tablet 4 mg   acetaminophen (TYLENOL) tablet 650 mg   alum & mag hydroxide-simeth (MAALOX/MYLANTA) 200-200-20 MG/5ML suspension 30 mL   magnesium hydroxide (MILK OF MAGNESIA) suspension 30 mL   nicotine (NICODERM CQ - dosed in mg/24 hours) patch 21 mg   traMADol (ULTRAM) tablet 50 mg   glipiZIDE (GLUCOTROL XL) 24 hr tablet 10 mg   traZODone (DESYREL) tablet 50 mg   PTA Medications  Medication Sig   rosuvastatin (CRESTOR) 20 MG tablet Take 20 mg by mouth at bedtime.   gabapentin (NEURONTIN) 600 MG tablet Take 1,200 mg by mouth 3 (three) times daily.   glipiZIDE (GLUCOTROL XL) 10 MG 24 hr tablet TAKE 1 TABLET (10 MG TOTAL) BY MOUTH DAILY. (Patient taking differently: Take 10 mg by mouth daily.)   carvedilol (COREG) 6.25  MG tablet TAKE 1 TABLET (6.25 MG TOTAL) BY MOUTH TWO TIMES DAILY WITH A MEAL. (Patient taking differently: Take 6.25 mg by mouth 2 (two) times daily with a meal.)   clopidogrel (PLAVIX) 75 MG tablet Take 75 mg by mouth daily.   polyethylene glycol (MIRALAX / GLYCOLAX) 17 g packet Take 17 g by mouth daily as needed for mild constipation.   Dulaglutide (TRULICITY) 1.5 0000000 SOPN Inject 1.5 mg into the skin once a week. Friday   sertraline (ZOLOFT) 50 MG tablet Take 75 mg by mouth daily. Taking 1 & 1/2 tabs daily = 75 mg 7 DS delivered on 04-01-22   traZODone (DESYREL) 50 MG tablet Take 50 mg by mouth at bedtime.   thiamine (VITAMIN B-1) 100 MG tablet Take 100 mg by mouth daily.   sacubitril-valsartan (ENTRESTO) 24-26 MG Take 1 tablet by mouth 2 (two) times daily.   metFORMIN (GLUCOPHAGE) 1000 MG tablet Take 1,000 mg by mouth 2 (two) times daily with a meal.   senna-docusate (SENOKOT-S) 8.6-50 MG tablet Take 2 tablets by mouth daily as needed for mild constipation.   tiZANidine (ZANAFLEX) 4 MG capsule Take 4 mg by mouth 2 (two) times daily as needed for muscle spasms.   traMADol (ULTRAM) 50 MG tablet Take 50 mg by mouth every 6 (six) hours as needed for moderate pain.   apixaban (ELIQUIS) 5 MG TABS tablet Take 1 tablet (5 mg total) by mouth 2 (two) times daily.   blood glucose meter kit and supplies KIT Dispense based on patient and insurance  preference. Use up to four times daily as directed. (FOR ICD-9 250.00, 250.01).   albuterol (VENTOLIN HFA) 108 (90 Base) MCG/ACT inhaler Inhale 2 puffs into the lungs every 4 (four) hours as needed for wheezing or shortness of breath.   famotidine (PEPCID) 20 MG tablet TAKE 1 TABLET (20 MG TOTAL) BY MOUTH TWO TIMES DAILY.   [START ON 05/07/2022] nicotine (NICODERM CQ - DOSED IN MG/24 HOURS) 21 mg/24hr patch Place 1 patch (21 mg total) onto the skin daily.       05/06/2022    1:17 PM 05/04/2022   11:47 AM 05/03/2022    2:59 PM  Depression screen PHQ 2/9   Decreased Interest '1 3 2  '$ Down, Depressed, Hopeless 0 3 3  PHQ - 2 Score '1 6 5  '$ Altered sleeping '1 2 2  '$ Tired, decreased energy 0 2 3  Change in appetite 0 2 3  Feeling bad or failure about yourself  0 3 2  Trouble concentrating '1 3 3  '$ Moving slowly or fidgety/restless 0 2 1  Suicidal thoughts 0 2 2  PHQ-9 Score '3 22 21  '$ Difficult doing work/chores Somewhat difficult Somewhat difficult Very difficult    Flowsheet Row ED from 05/03/2022 in [facility] ED from 05/01/2022 in [facility] Emergency Department at [facility] ED to Hosp-Admission (Discharged) from 04/13/2022 in [place] No Risk Error: Q3, 4, or 5 should not be populated when Q2 is No No Risk      Musculoskeletal  Strength & Muscle Tone: within normal limits Gait & Station: normal Patient leans: N/A  Psychiatric Specialty Exam  Presentation General Appearance: Appropriate for Environment  Eye Contact:Fair  Speech:Clear and Coherent  Speech Volume:Normal  Handedness:-- (not assessed)   Mood and Affect  Mood:Euthymic  Affect:Congruent   Thought Process  Thought Processes:Coherent; Linear  Descriptions of Associations:Intact  Orientation:Full (Time, Place and Person)  Thought Content:Logical    Hallucinations:Hallucinations: None  Ideas of Reference:None  Suicidal Thoughts:Suicidal Thoughts: No  Homicidal Thoughts:Homicidal Thoughts: No   Sensorium  Memory:Immediate Fair; Recent Fair; Remote Fair  Judgment:Fair  Insight:Fair   Executive Functions  Concentration:Fair  Attention Span:Fair  [place]   Psychomotor Activity  Psychomotor Activity:Psychomotor Activity: Normal   Assets  Assets:Communication Skills; Resilience   Sleep  Sleep:Sleep: Fair    Physical Exam Constitutional:      Appearance: the patient is not toxic-appearing.  Pulmonary:      Effort: Pulmonary effort is normal.  Neurological:     General: No focal deficit present.     Mental Status: the patient is alert and oriented to person, place, and time.   Review of Systems  Respiratory:  Negative for shortness of breath.   Cardiovascular:  Negative for chest pain.  Gastrointestinal:  Negative for abdominal pain, constipation, diarrhea, nausea and vomiting.  Neurological:  Negative for headaches.    BP (!) 144/91   Pulse 87   Temp 97.9 F (36.6 C) (Oral)   Resp 17   SpO2 100%   Demographic Factors:  None pertinent   Loss Factors: NA   Historical Factors: NA   Risk Reduction Factors:   Positive social support, Positive therapeutic relationship, and Positive coping skills or problem solving skills   Continued Clinical Symptoms:  Substance use    Cognitive Features That Contribute To Risk:  None     Suicide Risk:  Mild   Plan  Of Care/Follow-up recommendations:  Activity as tolerated. Diet as recommended by PCP. Keep all scheduled follow-up appointments as recommended.   Patient is instructed to take all prescribed medications as recommended. Report any side effects or adverse reactions to your outpatient psychiatrist. Patient is instructed to abstain from alcohol and illegal drugs while on prescription medications. In the event of worsening symptoms, patient is instructed to call the crisis hotline, 911, or go to the nearest emergency department for evaluation and treatment.     Patient is also instructed prior to discharge to: Take all medications as prescribed by mental healthcare provider. Report any adverse effects and or reactions from the medicines to outpatient provider promptly. Patient has been instructed & cautioned: To not engage in alcohol and or illegal drug use while on prescription medicines. In the event of worsening symptoms,  patient is instructed to call the crisis hotline, 911 and or go to the nearest ED for appropriate  evaluation and treatment of symptoms. To follow-up with primary care provider for other medical issues, concerns and or health care needs    Disposition: self-care     Corky Sox, MD 05/06/2022, 1:18 PM

## 2022-05-06 NOTE — ED Notes (Signed)
Pt is in the bed sleeping. Respirations are even and unlabored. No acute distress noted. Will continue to monitor for safety. 

## 2022-05-06 NOTE — Discharge Planning (Signed)
LCSW spoke with patient on today to inquire about transportation availability if discharging on today. Patient reports he reaches out to his ACT team for transportation needs. LCSW contacted [facility] [phone] and was directed to the [facility]. Per Respresentative, they can have someone available to pick the patient up by 1:00pm. MD, Staff, and patient to be made aware. No other needs were reported at this time. Patient receives therapy and medication management follow up from agency and will resume care once discharged.   Lucius Conn, LCSW Clinical Social Worker [facility] Ph: [phone]

## 2022-05-06 NOTE — ED Notes (Signed)
Patient A&Ox4. Denies intent to harm self/others when asked. Denies A/VH. Patientc/o generalized chronic leg pain. Medication given for pain relief. Support and encouragement provided. Routine safety checks conducted according to facility protocol. Encouraged patient to notify staff if thoughts of harm toward self or others arise. Patient verbalize understanding and agreement. Will continue to monitor for safety.

## 2022-05-06 NOTE — ED Notes (Signed)
Patient A&O x 4, ambulatory. Patient discharged in no acute distress. Patient denied SI/HI, A/VH upon discharge. Patient verbalized understanding of all discharge instructions explained by staff, to include follow up appointments and safety plan.  Pt belongings returned to patient from locker #2 intact. Patient escorted to lobby via staff for transport to destination via his ACTT. Safety maintained.

## 2022-09-08 ENCOUNTER — Encounter: Payer: Self-pay | Admitting: Family Medicine

## 2022-09-08 DIAGNOSIS — F17210 Nicotine dependence, cigarettes, uncomplicated: Secondary | ICD-10-CM

## 2022-09-09 IMAGING — MR MR LUMBAR SPINE W/O CM
4 of 5 series · 18 of 48 positions shown · non-contrast
Comparison: 03/01/2008

CLINICAL DATA: Low back pain and bilateral leg weakness which is
worsening.

EXAM:
MRI LUMBAR SPINE WITHOUT CONTRAST
TECHNIQUE: Multiplanar, multisequence MR imaging of the lumbar spine was
performed. No intravenous contrast was administered.

[Series 3: T2 · sagittal · 4.0mm · 0.55mm/px · 5 of 16 slices shown (1 of 2)]
[im 1/16]
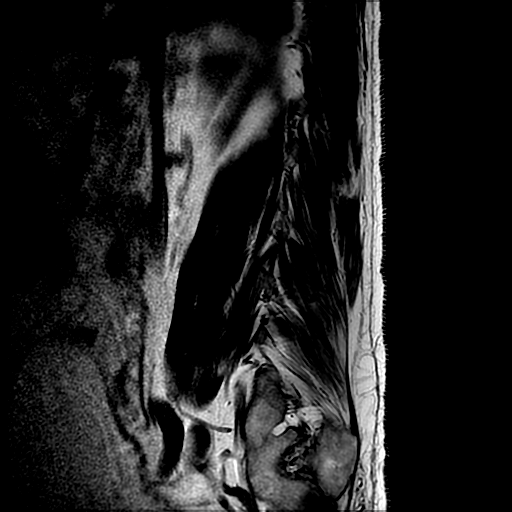
[im 4/16]
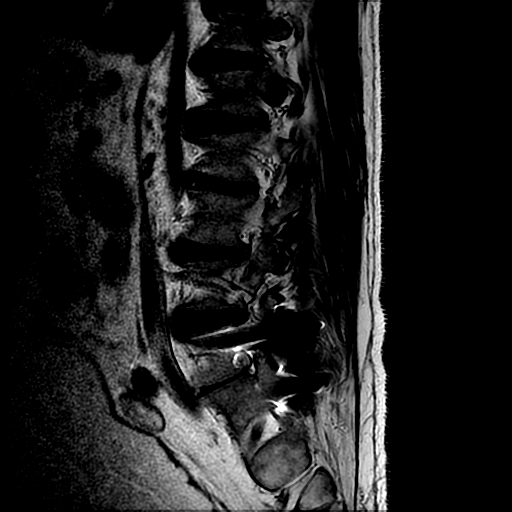
[im 8/16]
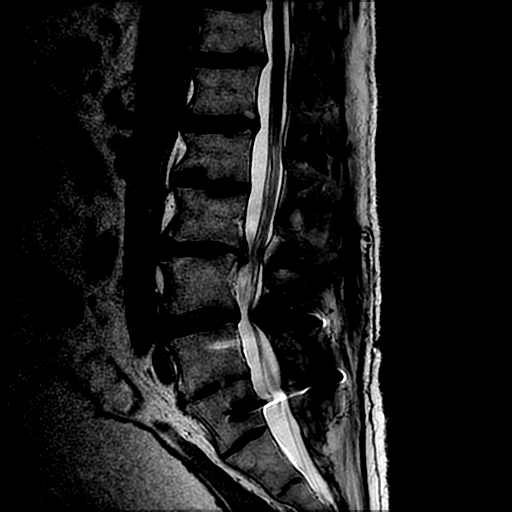
[im 12/16]
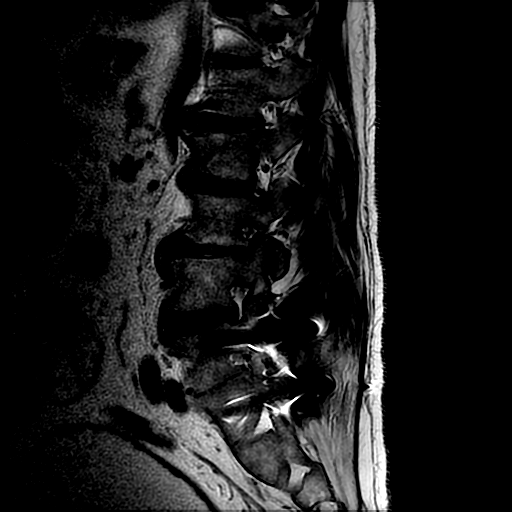
[im 16/16]
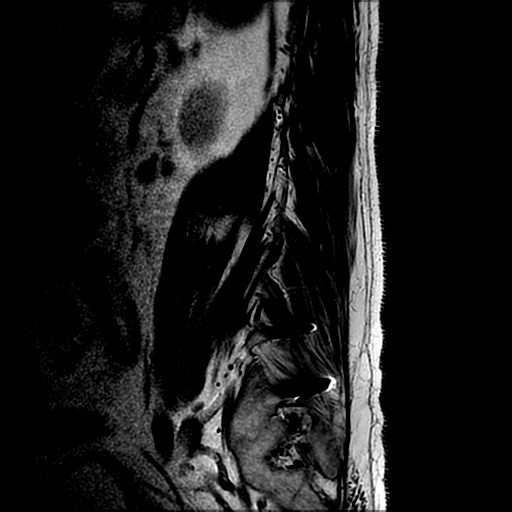

[Series 5: T1 · sagittal · 4.0mm · 0.55mm/px · 3 of 16 slices shown (1 of 2)]
[im 4/16]
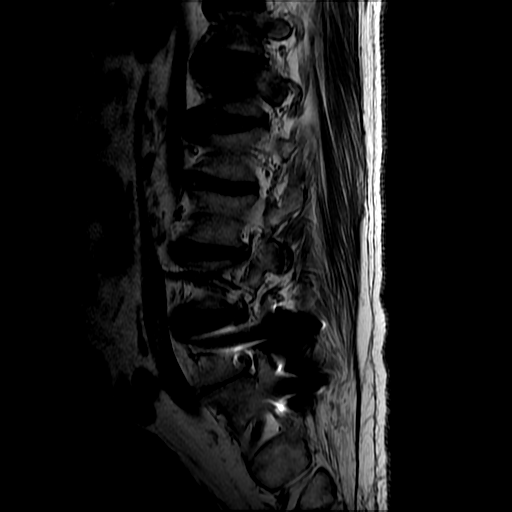
[im 10/16]
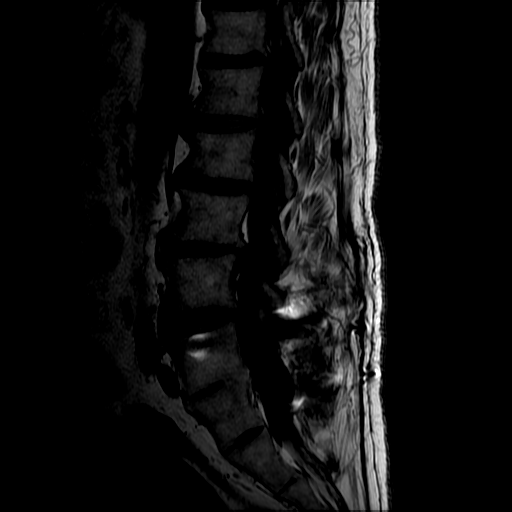
[im 16/16]
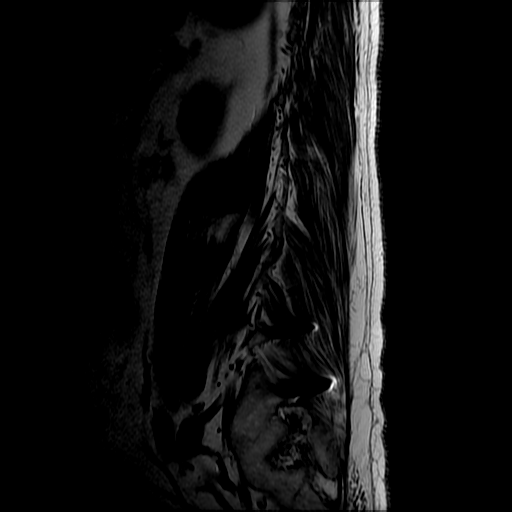

[Series 6: T2 · axial · 4.0mm · 0.39mm/px · z∈[-64,+134]mm · 7 of 44 slices shown (2 of 2)]
[im 3/44]
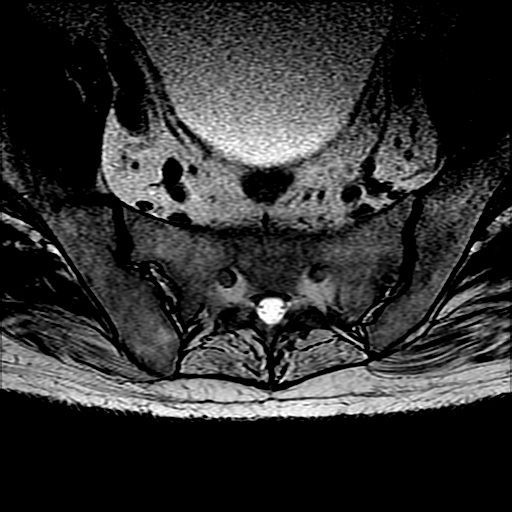
[im 6/44]
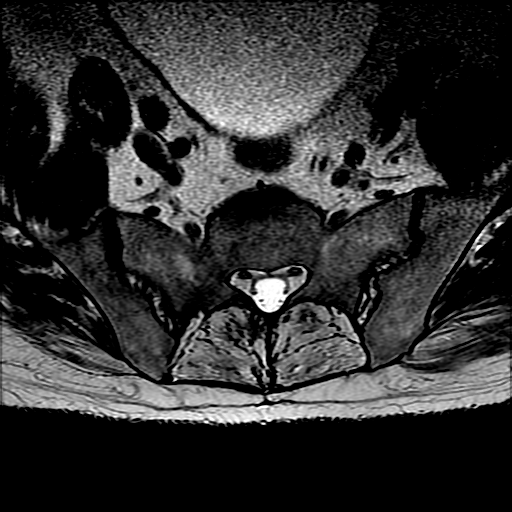
[im 9/44]
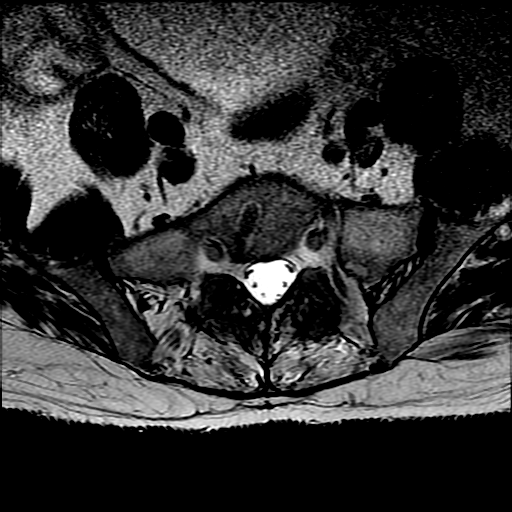
[im 15/44]
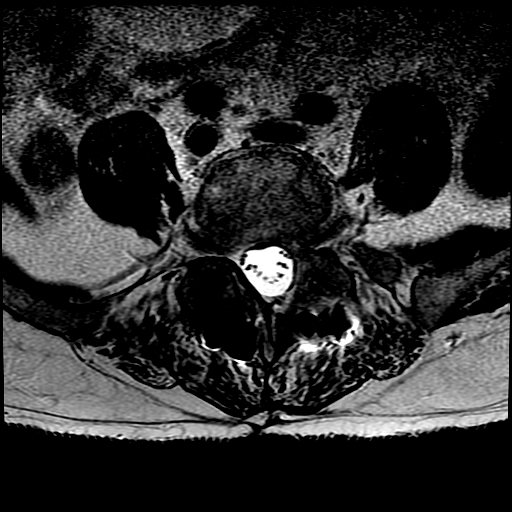
[im 21/44]
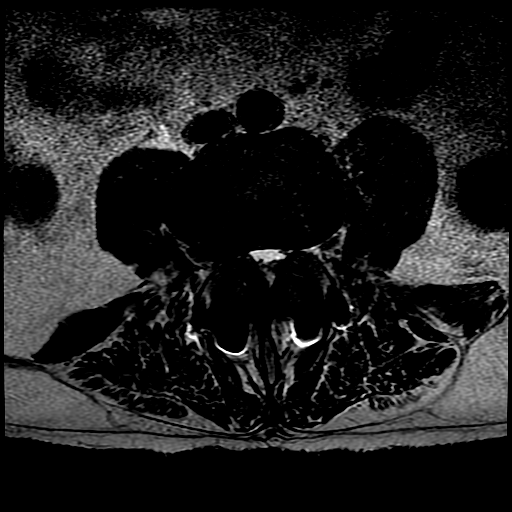
[im 23/44]
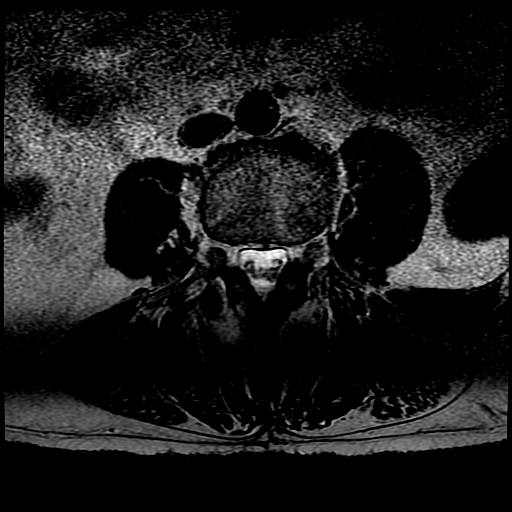
[im 38/44]
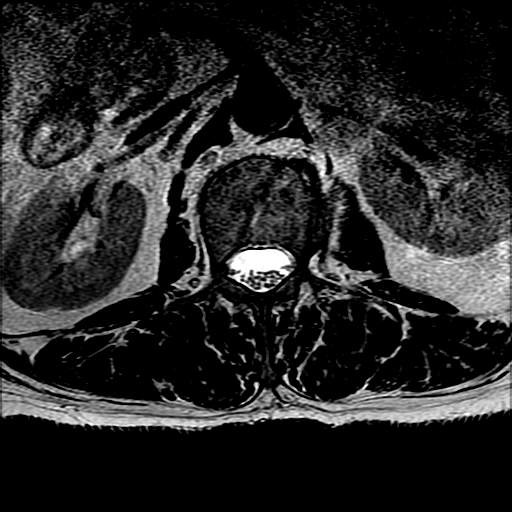

[Series 7: T1 · axial · 4.0mm · 0.39mm/px · z∈[-50,+134]mm · 3 of 44 slices shown (2 of 2)]
[im 6/44]
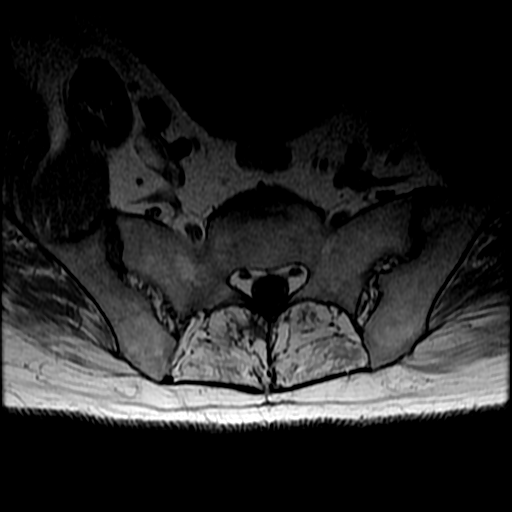
[im 23/44]
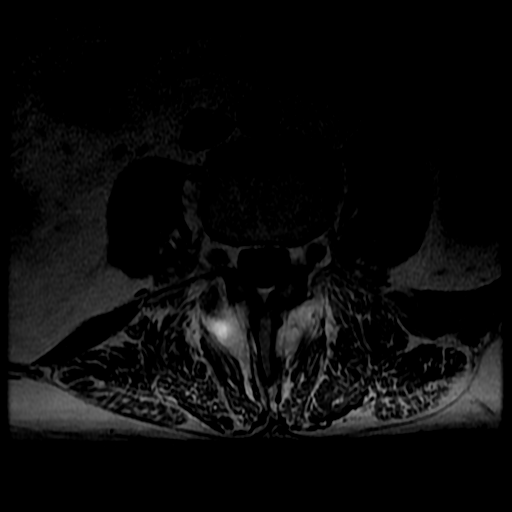
[im 38/44]
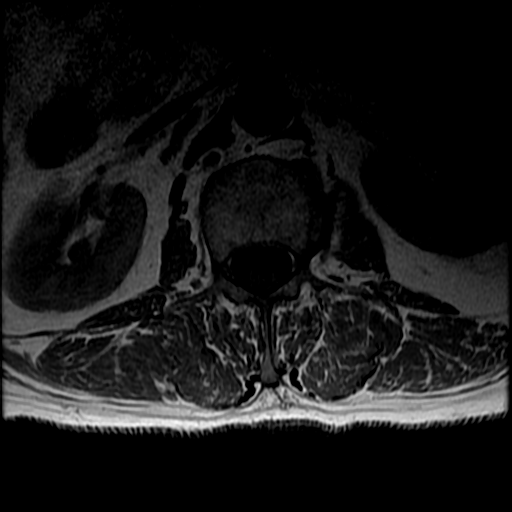

[18 of 48 positions shown; findings below may reference images not displayed]

FINDINGS: Segmentation: 5 lumbar type vertebral bodies. Distant fusion at the
L5-S1 level.

Alignment:  2 mm retrolisthesis L3-4.

Vertebrae: Distant fusion at the L5-S1 level. Chronic inferior
endplate Schmorl's node at L2.

Conus medullaris and cauda equina: Conus extends to the upper L2
level. Conus and cauda equina appear normal.

Paraspinal and other soft tissues: Negative

Disc levels:

T12-L1: Mild disc bulge. Mild facet hypertrophy. No compressive
stenosis.

L1-2: Mild disc bulge. Mild facet and ligamentous prominence. No
compressive stenosis.

L2-3: Mild disc bulge.  No stenosis.

L3-4: 2 mm retrolisthesis. Disc degeneration. Endplate osteophytes.
Broad-based disc herniation with caudal migration of a fragment to
the left of midline. Spinal stenosis at this level that could cause
neural compression on either or both sides.

L4-5: Endplate osteophytes and shallow disc protrusion towards the
right. Facet and ligamentous hypertrophy. Stenosis of the
subarticular lateral recess on the right that could compress the L5
nerve.

L5-S1: Distant posterior decompression and fusion procedure. Fusion
is solid. Wide patency of the canal and foramina. Screw on the right
at S1 does seem to cross the lateral recess, but this is obviously a
chronic finding.
IMPRESSION: 1. L3-4: Broad-based disc herniation with caudal migration of a
fragment to the left of midline. Spinal stenosis at this level that
could cause neural compression on either or both sides.
2. L4-5: Endplate osteophytes and shallow disc protrusion towards
the right. Facet and ligamentous hypertrophy. Stenosis of the
subarticular lateral recess on the right that could compress the
right L5 nerve.
3. L5-S1: Distant fusion procedure with solid union and wide patency
of the canal and foramina. Screw on the right at S1 does seem to
cross the lateral recess, but this is obviously a chronic finding.

## 2022-09-19 ENCOUNTER — Other Ambulatory Visit: Payer: Self-pay | Admitting: Family Medicine

## 2022-09-19 DIAGNOSIS — F17210 Nicotine dependence, cigarettes, uncomplicated: Secondary | ICD-10-CM

## 2022-10-24 ENCOUNTER — Inpatient Hospital Stay: Admission: RE | Admit: 2022-10-24 | Payer: 59 | Source: Ambulatory Visit
